# Patient Record
Sex: Female | Born: 1950
Health system: Southern US, Community
[De-identification: ages and names within clinical notes are randomized; demographics above are authoritative.]

## PROBLEM LIST (undated history)

## (undated) DIAGNOSIS — I10 Essential (primary) hypertension: Secondary | ICD-10-CM

## (undated) DIAGNOSIS — Z9049 Acquired absence of other specified parts of digestive tract: Secondary | ICD-10-CM

## (undated) DIAGNOSIS — G629 Polyneuropathy, unspecified: Secondary | ICD-10-CM

## (undated) DIAGNOSIS — M1710 Unilateral primary osteoarthritis, unspecified knee: Secondary | ICD-10-CM

## (undated) DIAGNOSIS — A419 Sepsis, unspecified organism: Secondary | ICD-10-CM

## (undated) DIAGNOSIS — G43909 Migraine, unspecified, not intractable, without status migrainosus: Secondary | ICD-10-CM

## (undated) DIAGNOSIS — IMO0002 Reserved for concepts with insufficient information to code with codable children: Secondary | ICD-10-CM

## (undated) DIAGNOSIS — M549 Dorsalgia, unspecified: Secondary | ICD-10-CM

## (undated) DIAGNOSIS — I471 Supraventricular tachycardia, unspecified: Secondary | ICD-10-CM

## (undated) DIAGNOSIS — Z981 Arthrodesis status: Secondary | ICD-10-CM

## (undated) DIAGNOSIS — E039 Hypothyroidism, unspecified: Secondary | ICD-10-CM

## (undated) DIAGNOSIS — G8929 Other chronic pain: Secondary | ICD-10-CM

## (undated) DIAGNOSIS — E7800 Pure hypercholesterolemia, unspecified: Secondary | ICD-10-CM

## (undated) DIAGNOSIS — Z0000 Encounter for general adult medical examination without abnormal findings: Secondary | ICD-10-CM

## (undated) DIAGNOSIS — M2392 Unspecified internal derangement of left knee: Secondary | ICD-10-CM

## (undated) DIAGNOSIS — I7300 Raynaud's syndrome without gangrene: Secondary | ICD-10-CM

## (undated) DIAGNOSIS — L409 Psoriasis, unspecified: Secondary | ICD-10-CM

## (undated) DIAGNOSIS — S32009K Unspecified fracture of unspecified lumbar vertebra, subsequent encounter for fracture with nonunion: Secondary | ICD-10-CM

## (undated) DIAGNOSIS — M2390 Unspecified internal derangement of unspecified knee: Secondary | ICD-10-CM

## (undated) DIAGNOSIS — M25569 Pain in unspecified knee: Secondary | ICD-10-CM

## (undated) DIAGNOSIS — T7840XA Allergy, unspecified, initial encounter: Secondary | ICD-10-CM

## (undated) DIAGNOSIS — J189 Pneumonia, unspecified organism: Secondary | ICD-10-CM

## (undated) DIAGNOSIS — K529 Noninfective gastroenteritis and colitis, unspecified: Secondary | ICD-10-CM

## (undated) DIAGNOSIS — M2240 Chondromalacia patellae, unspecified knee: Secondary | ICD-10-CM

## (undated) DIAGNOSIS — K5790 Diverticulosis of intestine, part unspecified, without perforation or abscess without bleeding: Secondary | ICD-10-CM

## (undated) DIAGNOSIS — K635 Polyp of colon: Secondary | ICD-10-CM

## (undated) DIAGNOSIS — M1990 Unspecified osteoarthritis, unspecified site: Secondary | ICD-10-CM

## (undated) DIAGNOSIS — A020 Salmonella enteritis: Secondary | ICD-10-CM

## (undated) DIAGNOSIS — M7138 Other bursal cyst, other site: Secondary | ICD-10-CM

## (undated) DIAGNOSIS — D509 Iron deficiency anemia, unspecified: Secondary | ICD-10-CM

## (undated) DIAGNOSIS — M4800 Spinal stenosis, site unspecified: Secondary | ICD-10-CM

## (undated) DIAGNOSIS — I872 Venous insufficiency (chronic) (peripheral): Secondary | ICD-10-CM

## (undated) DIAGNOSIS — C439 Malignant melanoma of skin, unspecified: Secondary | ICD-10-CM

## (undated) DIAGNOSIS — M359 Systemic involvement of connective tissue, unspecified: Secondary | ICD-10-CM

## (undated) DIAGNOSIS — M797 Fibromyalgia: Secondary | ICD-10-CM

## (undated) DIAGNOSIS — R509 Fever, unspecified: Secondary | ICD-10-CM

## (undated) DIAGNOSIS — R079 Chest pain, unspecified: Secondary | ICD-10-CM

## (undated) DIAGNOSIS — N189 Chronic kidney disease, unspecified: Secondary | ICD-10-CM

## (undated) DIAGNOSIS — F418 Other specified anxiety disorders: Secondary | ICD-10-CM

## (undated) DIAGNOSIS — R230 Cyanosis: Secondary | ICD-10-CM

## (undated) DIAGNOSIS — B86 Scabies: Secondary | ICD-10-CM

## (undated) DIAGNOSIS — S83282A Other tear of lateral meniscus, current injury, left knee, initial encounter: Secondary | ICD-10-CM

## (undated) HISTORY — PX: COLONOSCOPY: SHX174

## (undated) HISTORY — PX: PATELLAR REEFING: SHX5418

## (undated) HISTORY — DX: Fibromyalgia: M79.7

## (undated) HISTORY — DX: Other bursal cyst, other site: M71.38

## (undated) HISTORY — DX: Other specified anxiety disorders: F41.8

## (undated) HISTORY — DX: Other chronic pain: G89.29

## (undated) HISTORY — DX: Fever, unspecified: R50.9

## (undated) HISTORY — DX: Noninfective gastroenteritis and colitis, unspecified: K52.9

## (undated) HISTORY — DX: Unspecified osteoarthritis, unspecified site: M19.90

## (undated) HISTORY — DX: Scabies: B86

## (undated) HISTORY — DX: Unspecified fracture of unspecified lumbar vertebra, subsequent encounter for fracture with nonunion: S32.009K

## (undated) HISTORY — PX: SPINE SURGERY: SHX786

## (undated) HISTORY — DX: Essential (primary) hypertension: I10

## (undated) HISTORY — DX: Reserved for concepts with insufficient information to code with codable children: IMO0002

## (undated) HISTORY — DX: Chest pain, unspecified: R07.9

## (undated) HISTORY — DX: Pneumonia, unspecified organism: J18.9

## (undated) HISTORY — DX: Unspecified internal derangement of unspecified knee: M23.90

## (undated) HISTORY — DX: Chronic kidney disease, unspecified: N18.9

## (undated) HISTORY — DX: Malignant melanoma of skin, unspecified: C43.9

## (undated) HISTORY — PX: VENOUS ABLATION: SHX2656

## (undated) HISTORY — DX: Polyneuropathy, unspecified: G62.9

## (undated) HISTORY — DX: Migraine, unspecified, not intractable, without status migrainosus: G43.909

## (undated) HISTORY — DX: Allergy, unspecified, initial encounter: T78.40XA

## (undated) HISTORY — DX: Cyanosis: R23.0

## (undated) HISTORY — DX: Diverticulosis of intestine, part unspecified, without perforation or abscess without bleeding: K57.90

## (undated) HISTORY — PX: OTHER SURGICAL HISTORY: SHX169

## (undated) HISTORY — DX: Venous insufficiency (chronic) (peripheral): I87.2

## (undated) HISTORY — DX: Salmonella enteritis: A02.0

## (undated) HISTORY — PX: WISDOM TOOTH EXTRACTION: SHX21

## (undated) HISTORY — DX: Arthrodesis status: Z98.1

## (undated) HISTORY — DX: Sepsis, unspecified organism: A41.9

## (undated) HISTORY — DX: Other tear of lateral meniscus, current injury, left knee, initial encounter: S83.282A

## (undated) HISTORY — DX: Unspecified internal derangement of left knee: M23.92

## (undated) HISTORY — DX: Chondromalacia patellae, unspecified knee: M22.40

## (undated) HISTORY — DX: Polyp of colon: K63.5

## (undated) HISTORY — DX: Iron deficiency anemia, unspecified: D50.9

## (undated) HISTORY — DX: Encounter for general adult medical examination without abnormal findings: Z00.00

## (undated) HISTORY — DX: Unilateral primary osteoarthritis, unspecified knee: M17.10

## (undated) HISTORY — DX: Psoriasis, unspecified: L40.9

## (undated) HISTORY — DX: Spinal stenosis, site unspecified: M48.00

## (undated) HISTORY — DX: Hypothyroidism, unspecified: E03.9

---
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
## 1997-03-13 HISTORY — PX: CERVICAL DISCECTOMY: SHX98

## 1997-07-20 ENCOUNTER — Other Ambulatory Visit: Admission: RE | Admit: 1997-07-20 | Discharge: 1997-07-20 | Payer: Self-pay | Admitting: Obstetrics & Gynecology

## 1997-11-23 ENCOUNTER — Emergency Department (HOSPITAL_COMMUNITY): Admission: EM | Admit: 1997-11-23 | Discharge: 1997-11-24 | Payer: Self-pay | Admitting: Emergency Medicine

## 1998-06-11 ENCOUNTER — Encounter: Payer: Self-pay | Admitting: Orthopedic Surgery

## 1998-06-11 ENCOUNTER — Ambulatory Visit (HOSPITAL_COMMUNITY): Admission: RE | Admit: 1998-06-11 | Discharge: 1998-06-11 | Payer: Self-pay | Admitting: Orthopedic Surgery

## 1998-06-23 ENCOUNTER — Encounter: Payer: Self-pay | Admitting: Orthopedic Surgery

## 1998-06-23 ENCOUNTER — Ambulatory Visit (HOSPITAL_COMMUNITY): Admission: RE | Admit: 1998-06-23 | Discharge: 1998-06-23 | Payer: Self-pay | Admitting: Orthopedic Surgery

## 1998-07-05 ENCOUNTER — Encounter: Payer: Self-pay | Admitting: Orthopedic Surgery

## 1998-07-05 ENCOUNTER — Ambulatory Visit (HOSPITAL_COMMUNITY): Admission: RE | Admit: 1998-07-05 | Discharge: 1998-07-05 | Payer: Self-pay | Admitting: Orthopedic Surgery

## 1998-12-29 ENCOUNTER — Other Ambulatory Visit: Admission: RE | Admit: 1998-12-29 | Discharge: 1998-12-29 | Payer: Self-pay | Admitting: Obstetrics and Gynecology

## 1999-10-13 ENCOUNTER — Ambulatory Visit (HOSPITAL_COMMUNITY): Admission: RE | Admit: 1999-10-13 | Discharge: 1999-10-13 | Payer: Self-pay | Admitting: *Deleted

## 2000-04-08 ENCOUNTER — Emergency Department (HOSPITAL_COMMUNITY): Admission: EM | Admit: 2000-04-08 | Discharge: 2000-04-08 | Payer: Self-pay | Admitting: *Deleted

## 2000-04-08 ENCOUNTER — Encounter: Payer: Self-pay | Admitting: *Deleted

## 2001-02-06 ENCOUNTER — Emergency Department (HOSPITAL_COMMUNITY): Admission: EM | Admit: 2001-02-06 | Discharge: 2001-02-06 | Payer: Self-pay | Admitting: Emergency Medicine

## 2001-03-28 ENCOUNTER — Emergency Department (HOSPITAL_COMMUNITY): Admission: EM | Admit: 2001-03-28 | Discharge: 2001-03-28 | Payer: Self-pay | Admitting: Emergency Medicine

## 2001-04-02 ENCOUNTER — Inpatient Hospital Stay (HOSPITAL_COMMUNITY): Admission: EM | Admit: 2001-04-02 | Discharge: 2001-04-06 | Payer: Self-pay | Admitting: Emergency Medicine

## 2001-07-10 ENCOUNTER — Other Ambulatory Visit: Admission: RE | Admit: 2001-07-10 | Discharge: 2001-07-10 | Payer: Self-pay | Admitting: Obstetrics and Gynecology

## 2001-12-09 ENCOUNTER — Encounter: Payer: Self-pay | Admitting: Sports Medicine

## 2001-12-09 ENCOUNTER — Ambulatory Visit (HOSPITAL_COMMUNITY): Admission: RE | Admit: 2001-12-09 | Discharge: 2001-12-09 | Payer: Self-pay | Admitting: Sports Medicine

## 2001-12-17 ENCOUNTER — Encounter: Payer: Self-pay | Admitting: Neurosurgery

## 2001-12-18 ENCOUNTER — Encounter: Payer: Self-pay | Admitting: Neurosurgery

## 2001-12-18 ENCOUNTER — Inpatient Hospital Stay (HOSPITAL_COMMUNITY): Admission: RE | Admit: 2001-12-18 | Discharge: 2001-12-19 | Payer: Self-pay | Admitting: Neurosurgery

## 2002-07-17 ENCOUNTER — Other Ambulatory Visit: Admission: RE | Admit: 2002-07-17 | Discharge: 2002-07-17 | Payer: Self-pay | Admitting: Obstetrics and Gynecology

## 2002-10-17 ENCOUNTER — Ambulatory Visit (HOSPITAL_COMMUNITY): Admission: RE | Admit: 2002-10-17 | Discharge: 2002-10-17 | Payer: Self-pay | Admitting: Gastroenterology

## 2003-02-10 ENCOUNTER — Encounter: Admission: RE | Admit: 2003-02-10 | Discharge: 2003-02-10 | Payer: Self-pay | Admitting: Family Medicine

## 2004-03-16 ENCOUNTER — Encounter: Admission: RE | Admit: 2004-03-16 | Discharge: 2004-03-16 | Payer: Self-pay | Admitting: Neurosurgery

## 2004-04-18 ENCOUNTER — Emergency Department (HOSPITAL_COMMUNITY): Admission: EM | Admit: 2004-04-18 | Discharge: 2004-04-19 | Payer: Self-pay | Admitting: Emergency Medicine

## 2004-04-21 ENCOUNTER — Encounter: Admission: RE | Admit: 2004-04-21 | Discharge: 2004-04-21 | Payer: Self-pay | Admitting: Family Medicine

## 2004-08-17 ENCOUNTER — Other Ambulatory Visit: Admission: RE | Admit: 2004-08-17 | Discharge: 2004-08-17 | Payer: Self-pay | Admitting: Obstetrics and Gynecology

## 2004-08-17 ENCOUNTER — Ambulatory Visit (HOSPITAL_COMMUNITY): Admission: RE | Admit: 2004-08-17 | Discharge: 2004-08-17 | Payer: Self-pay | Admitting: Obstetrics and Gynecology

## 2005-03-13 HISTORY — PX: SYNOVIAL CYST EXCISION: SUR507

## 2005-05-16 ENCOUNTER — Encounter: Admission: RE | Admit: 2005-05-16 | Discharge: 2005-05-16 | Payer: Self-pay | Admitting: Family Medicine

## 2005-07-13 ENCOUNTER — Inpatient Hospital Stay (HOSPITAL_COMMUNITY): Admission: RE | Admit: 2005-07-13 | Discharge: 2005-07-16 | Payer: Self-pay | Admitting: Neurosurgery

## 2005-09-20 ENCOUNTER — Other Ambulatory Visit: Admission: RE | Admit: 2005-09-20 | Discharge: 2005-09-20 | Payer: Self-pay | Admitting: Obstetrics and Gynecology

## 2006-07-04 ENCOUNTER — Encounter: Admission: RE | Admit: 2006-07-04 | Discharge: 2006-07-04 | Payer: Self-pay | Admitting: Family Medicine

## 2007-03-14 DIAGNOSIS — M2392 Unspecified internal derangement of left knee: Secondary | ICD-10-CM

## 2007-03-14 HISTORY — DX: Unspecified internal derangement of left knee: M23.92

## 2007-03-14 HISTORY — PX: KNEE ARTHROSCOPY W/ MENISCAL REPAIR: SHX1877

## 2007-04-30 ENCOUNTER — Encounter: Admission: RE | Admit: 2007-04-30 | Discharge: 2007-04-30 | Payer: Self-pay | Admitting: Family Medicine

## 2008-04-25 ENCOUNTER — Ambulatory Visit: Payer: Self-pay | Admitting: Internal Medicine

## 2008-04-25 ENCOUNTER — Observation Stay (HOSPITAL_COMMUNITY): Admission: EM | Admit: 2008-04-25 | Discharge: 2008-04-25 | Payer: Self-pay | Admitting: Emergency Medicine

## 2008-10-16 ENCOUNTER — Ambulatory Visit: Payer: Self-pay | Admitting: Vascular Surgery

## 2008-12-08 ENCOUNTER — Observation Stay (HOSPITAL_COMMUNITY): Admission: EM | Admit: 2008-12-08 | Discharge: 2008-12-08 | Payer: Self-pay | Admitting: Emergency Medicine

## 2009-01-22 ENCOUNTER — Ambulatory Visit: Payer: Self-pay | Admitting: Vascular Surgery

## 2009-03-17 ENCOUNTER — Ambulatory Visit: Payer: Self-pay | Admitting: Vascular Surgery

## 2009-03-24 ENCOUNTER — Ambulatory Visit: Payer: Self-pay | Admitting: Vascular Surgery

## 2010-01-12 ENCOUNTER — Encounter: Admission: RE | Admit: 2010-01-12 | Discharge: 2010-01-12 | Payer: Self-pay | Admitting: Sports Medicine

## 2010-02-11 ENCOUNTER — Emergency Department (HOSPITAL_COMMUNITY)
Admission: EM | Admit: 2010-02-11 | Discharge: 2010-02-11 | Payer: Self-pay | Source: Home / Self Care | Admitting: Emergency Medicine

## 2010-04-12 ENCOUNTER — Encounter
Admission: RE | Admit: 2010-04-12 | Discharge: 2010-04-12 | Payer: Self-pay | Source: Home / Self Care | Attending: Neurosurgery | Admitting: Neurosurgery

## 2010-05-23 LAB — POCT I-STAT, CHEM 8
BUN: 23 mg/dL (ref 6–23)
Calcium, Ion: 1.18 mmol/L (ref 1.12–1.32)
Chloride: 101 mEq/L (ref 96–112)
Creatinine, Ser: 1.1 mg/dL (ref 0.4–1.2)
Glucose, Bld: 95 mg/dL (ref 70–99)
HCT: 41 % (ref 36.0–46.0)
Hemoglobin: 13.9 g/dL (ref 12.0–15.0)
Potassium: 4.1 mEq/L (ref 3.5–5.1)
Sodium: 139 mEq/L (ref 135–145)
TCO2: 31 mmol/L (ref 0–100)

## 2010-05-23 LAB — DIFFERENTIAL
Basophils Absolute: 0 10*3/uL (ref 0.0–0.1)
Basophils Relative: 1 % (ref 0–1)
Eosinophils Absolute: 0.2 10*3/uL (ref 0.0–0.7)
Eosinophils Relative: 4 % (ref 0–5)
Lymphocytes Relative: 35 % (ref 12–46)
Lymphs Abs: 2.1 10*3/uL (ref 0.7–4.0)
Monocytes Absolute: 0.4 10*3/uL (ref 0.1–1.0)
Monocytes Relative: 7 % (ref 3–12)
Neutro Abs: 3.1 10*3/uL (ref 1.7–7.7)
Neutrophils Relative %: 53 % (ref 43–77)

## 2010-05-23 LAB — POCT CARDIAC MARKERS
CKMB, poc: <1 ng/mL — ABNORMAL LOW (ref 1.0–8.0)
CKMB, poc: <1 ng/mL — ABNORMAL LOW (ref 1.0–8.0)
Myoglobin, poc: 65 ng/mL (ref 12–200)
Myoglobin, poc: 74 ng/mL (ref 12–200)
Troponin i, poc: 0.09 ng/mL (ref 0.00–0.09)
Troponin i, poc: <0.05 ng/mL (ref 0.00–0.09)

## 2010-05-23 LAB — PROTIME-INR
INR: 0.92 (ref 0.00–1.49)
Prothrombin Time: 12.6 seconds (ref 11.6–15.2)

## 2010-05-23 LAB — D-DIMER, QUANTITATIVE (NOT AT ARMC): D-Dimer, Quant: <0.22 ug/mL-FEU (ref 0.00–0.48)

## 2010-05-23 LAB — CBC
HCT: 38.8 % (ref 36.0–46.0)
Hemoglobin: 12.9 g/dL (ref 12.0–15.0)
MCH: 29.3 pg (ref 26.0–34.0)
MCHC: 33.2 g/dL (ref 30.0–36.0)
MCV: 88.2 fL (ref 78.0–100.0)
Platelets: 245 10*3/uL (ref 150–400)
RBC: 4.4 MIL/uL (ref 3.87–5.11)
RDW: 13.4 % (ref 11.5–15.5)
WBC: 5.8 10*3/uL (ref 4.0–10.5)

## 2010-06-07 ENCOUNTER — Telehealth: Payer: Self-pay | Admitting: Internal Medicine

## 2010-06-07 NOTE — Telephone Encounter (Signed)
Forwarded to Dr, Debby Bud for review.

## 2010-06-17 LAB — DIFFERENTIAL
Basophils Absolute: 0 10*3/uL (ref 0.0–0.1)
Basophils Absolute: 0.1 10*3/uL (ref 0.0–0.1)
Basophils Relative: 0 % (ref 0–1)
Basophils Relative: 1 % (ref 0–1)
Eosinophils Absolute: 0.2 10*3/uL (ref 0.0–0.7)
Eosinophils Absolute: 0.3 10*3/uL (ref 0.0–0.7)
Eosinophils Relative: 4 % (ref 0–5)
Eosinophils Relative: 5 % (ref 0–5)
Lymphocytes Relative: 28 % (ref 12–46)
Lymphocytes Relative: 31 % (ref 12–46)
Lymphs Abs: 1.4 10*3/uL (ref 0.7–4.0)
Lymphs Abs: 2.4 10*3/uL (ref 0.7–4.0)
Monocytes Absolute: 0.3 10*3/uL (ref 0.1–1.0)
Monocytes Absolute: 0.5 10*3/uL (ref 0.1–1.0)
Monocytes Relative: 6 % (ref 3–12)
Monocytes Relative: 7 % (ref 3–12)
Neutro Abs: 2.9 10*3/uL (ref 1.7–7.7)
Neutro Abs: 4.4 10*3/uL (ref 1.7–7.7)
Neutrophils Relative %: 57 % (ref 43–77)
Neutrophils Relative %: 60 % (ref 43–77)

## 2010-06-17 LAB — POCT CARDIAC MARKERS
CKMB, poc: 1.6 ng/mL (ref 1.0–8.0)
CKMB, poc: 2.4 ng/mL (ref 1.0–8.0)
Myoglobin, poc: 84.8 ng/mL (ref 12–200)
Myoglobin, poc: 96.1 ng/mL (ref 12–200)
Troponin i, poc: <0.05 ng/mL (ref 0.00–0.09)
Troponin i, poc: <0.05 ng/mL (ref 0.00–0.09)

## 2010-06-17 LAB — COMPREHENSIVE METABOLIC PANEL
ALT: 18 U/L (ref 0–35)
AST: 25 U/L (ref 0–37)
Albumin: 3.8 g/dL (ref 3.5–5.2)
Alkaline Phosphatase: 59 U/L (ref 39–117)
BUN: 9 mg/dL (ref 6–23)
CO2: 26 mEq/L (ref 19–32)
Calcium: 8.8 mg/dL (ref 8.4–10.5)
Chloride: 106 mEq/L (ref 96–112)
Creatinine, Ser: 0.9 mg/dL (ref 0.4–1.2)
GFR calc Af Amer: >60 mL/min (ref >60)
GFR calc non Af Amer: >60 mL/min (ref >60)
Glucose, Bld: 90 mg/dL (ref 70–99)
Potassium: 4.4 mEq/L (ref 3.5–5.1)
Sodium: 141 mEq/L (ref 135–145)
Total Bilirubin: 0.9 mg/dL (ref 0.3–1.2)
Total Protein: 6.9 g/dL (ref 6.0–8.3)

## 2010-06-17 LAB — BASIC METABOLIC PANEL
BUN: 9 mg/dL (ref 6–23)
CO2: 29 mEq/L (ref 19–32)
Calcium: 9.4 mg/dL (ref 8.4–10.5)
Chloride: 104 mEq/L (ref 96–112)
Creatinine, Ser: 0.88 mg/dL (ref 0.4–1.2)
GFR calc Af Amer: >60 mL/min (ref >60)
GFR calc non Af Amer: >60 mL/min (ref >60)
Glucose, Bld: 101 mg/dL — ABNORMAL HIGH (ref 70–99)
Potassium: 4.7 mEq/L (ref 3.5–5.1)
Sodium: 140 mEq/L (ref 135–145)

## 2010-06-17 LAB — CBC
HCT: 39.8 % (ref 36.0–46.0)
HCT: 39.9 % (ref 36.0–46.0)
HCT: 40.7 % (ref 36.0–46.0)
Hemoglobin: 13.5 g/dL (ref 12.0–15.0)
Hemoglobin: 13.6 g/dL (ref 12.0–15.0)
Hemoglobin: 13.6 g/dL (ref 12.0–15.0)
MCHC: 33.4 g/dL (ref 30.0–36.0)
MCHC: 33.9 g/dL (ref 30.0–36.0)
MCHC: 34.1 g/dL (ref 30.0–36.0)
MCV: 89.3 fL (ref 78.0–100.0)
MCV: 90.5 fL (ref 78.0–100.0)
MCV: 90.5 fL (ref 78.0–100.0)
Platelets: 268 10*3/uL (ref 150–400)
Platelets: 298 10*3/uL (ref 150–400)
Platelets: 315 10*3/uL (ref 150–400)
RBC: 4.4 MIL/uL (ref 3.87–5.11)
RBC: 4.46 MIL/uL (ref 3.87–5.11)
RBC: 4.5 MIL/uL (ref 3.87–5.11)
RDW: 14.2 % (ref 11.5–15.5)
RDW: 14.2 % (ref 11.5–15.5)
RDW: 14.3 % (ref 11.5–15.5)
WBC: 4.8 10*3/uL (ref 4.0–10.5)
WBC: 7.7 10*3/uL (ref 4.0–10.5)
WBC: 8.1 10*3/uL (ref 4.0–10.5)

## 2010-06-17 LAB — PROTIME-INR
INR: 1 (ref 0.00–1.49)
INR: 1 (ref 0.00–1.49)
Prothrombin Time: 13.1 seconds (ref 11.6–15.2)
Prothrombin Time: 13.5 seconds (ref 11.6–15.2)

## 2010-06-17 LAB — LIPID PANEL
Cholesterol: 144 mg/dL (ref 0–200)
HDL: 61 mg/dL (ref >39)
LDL Cholesterol: 74 mg/dL (ref 0–99)
Total CHOL/HDL Ratio: 2.4 RATIO
Triglycerides: 44 mg/dL (ref <150)
VLDL: 9 mg/dL (ref 0–40)

## 2010-06-17 LAB — CARDIAC PANEL(CRET KIN+CKTOT+MB+TROPI)
CK, MB: 1.4 ng/mL (ref 0.3–4.0)
Relative Index: 1.4 (ref 0.0–2.5)
Total CK: 103 U/L (ref 7–177)
Troponin I: 0.02 ng/mL (ref 0.00–0.06)

## 2010-06-17 LAB — CK TOTAL AND CKMB (NOT AT ARMC)
CK, MB: 1.8 ng/mL (ref 0.3–4.0)
Relative Index: 1.7 (ref 0.0–2.5)
Total CK: 109 U/L (ref 7–177)

## 2010-06-17 LAB — TROPONIN I: Troponin I: 0.01 ng/mL (ref 0.00–0.06)

## 2010-06-17 LAB — LACTIC ACID, PLASMA: Lactic Acid, Venous: 1.2 mmol/L (ref 0.5–2.2)

## 2010-06-17 LAB — APTT: aPTT: 29 seconds (ref 24–37)

## 2010-06-28 LAB — POCT CARDIAC MARKERS
CKMB, poc: 1.3 ng/mL (ref 1.0–8.0)
Myoglobin, poc: 87.8 ng/mL (ref 12–200)
Troponin i, poc: <0.05 ng/mL (ref 0.00–0.09)

## 2010-06-28 LAB — DIFFERENTIAL
Basophils Absolute: 0 10*3/uL (ref 0.0–0.1)
Basophils Relative: 1 % (ref 0–1)
Eosinophils Absolute: 0.4 10*3/uL (ref 0.0–0.7)
Eosinophils Relative: 4 % (ref 0–5)
Lymphocytes Relative: 32 % (ref 12–46)
Lymphs Abs: 2.7 10*3/uL (ref 0.7–4.0)
Monocytes Absolute: 0.6 10*3/uL (ref 0.1–1.0)
Monocytes Relative: 7 % (ref 3–12)
Neutro Abs: 4.7 10*3/uL (ref 1.7–7.7)
Neutrophils Relative %: 56 % (ref 43–77)

## 2010-06-28 LAB — CBC
HCT: 41 % (ref 36.0–46.0)
Hemoglobin: 14.2 g/dL (ref 12.0–15.0)
MCHC: 34.7 g/dL (ref 30.0–36.0)
MCV: 87.7 fL (ref 78.0–100.0)
Platelets: 271 10*3/uL (ref 150–400)
RBC: 4.68 MIL/uL (ref 3.87–5.11)
RDW: 14.3 % (ref 11.5–15.5)
WBC: 8.4 10*3/uL (ref 4.0–10.5)

## 2010-06-28 LAB — CARDIAC PANEL(CRET KIN+CKTOT+MB+TROPI)
CK, MB: 1.4 ng/mL (ref 0.3–4.0)
CK, MB: 1.8 ng/mL (ref 0.3–4.0)
Relative Index: 1.8 (ref 0.0–2.5)
Relative Index: INVALID (ref 0.0–2.5)
Total CK: 100 U/L (ref 7–177)
Total CK: 86 U/L (ref 7–177)
Troponin I: <0.01 ng/mL (ref 0.00–0.06)
Troponin I: <0.01 ng/mL (ref 0.00–0.06)

## 2010-06-28 LAB — BASIC METABOLIC PANEL
BUN: 18 mg/dL (ref 6–23)
CO2: 29 mEq/L (ref 19–32)
Calcium: 9.2 mg/dL (ref 8.4–10.5)
Chloride: 100 mEq/L (ref 96–112)
Creatinine, Ser: 0.9 mg/dL (ref 0.4–1.2)
GFR calc Af Amer: >60 mL/min (ref >60)
GFR calc non Af Amer: >60 mL/min (ref >60)
Glucose, Bld: 99 mg/dL (ref 70–99)
Potassium: 3.1 mEq/L — ABNORMAL LOW (ref 3.5–5.1)
Sodium: 138 mEq/L (ref 135–145)

## 2010-06-28 LAB — CK TOTAL AND CKMB (NOT AT ARMC)

## 2010-06-28 LAB — TROPONIN I

## 2010-07-01 ENCOUNTER — Telehealth: Payer: Self-pay | Admitting: *Deleted

## 2010-07-01 NOTE — Telephone Encounter (Signed)
Not yet an established patient. For acute pain she should return to the [place] office where she has been seen....thye have an obligation to see her until she is established. In addition, they will have her old records to assist in a disposition whether it is today or at their Saturday clinic.

## 2010-07-01 NOTE — Telephone Encounter (Signed)
PER MD - pt should call [facility] for acute eval while waiting to establish with new PCP. Fiance aware - He says he will never go back to [facility] b/c he has been very dissatisfied with their service. I suggested local UC but he does not feel this is adequate. There was new pt opening Monday at 2:30 and I scheduled her for this time.   Pt is not due for annual physical until May. I advised him that MD will most likely have patient come back for another OV for CPX when due.

## 2010-07-01 NOTE — Telephone Encounter (Signed)
Pt will be a new patient in May. She has spinal stenosis and is not on chronic pain meds per her fiance. She has had surgeries by Dr Jule Ser - they did contact his office but they could not give her an apt or further advisement. Recently pt has knee injury and surgery which has aggravated her back problems. Per fiance - pt is crying and in severe pain. They feel that ER and/or UC will only tell her to f/u with her PCP.  PCP was MD at [facility] who has retired. They are req OV today to evaluate her back pain.

## 2010-07-04 ENCOUNTER — Encounter: Payer: Self-pay | Admitting: Internal Medicine

## 2010-07-04 ENCOUNTER — Ambulatory Visit (INDEPENDENT_AMBULATORY_CARE_PROVIDER_SITE_OTHER): Payer: 59 | Admitting: Internal Medicine

## 2010-07-04 DIAGNOSIS — M2390 Unspecified internal derangement of unspecified knee: Secondary | ICD-10-CM

## 2010-07-04 DIAGNOSIS — G43909 Migraine, unspecified, not intractable, without status migrainosus: Secondary | ICD-10-CM

## 2010-07-04 DIAGNOSIS — R0789 Other chest pain: Secondary | ICD-10-CM

## 2010-07-04 DIAGNOSIS — I10 Essential (primary) hypertension: Secondary | ICD-10-CM

## 2010-07-04 NOTE — Progress Notes (Signed)
Subjective:    Patient ID: [name], female    DOB: 1951/02/23, 60 y.o.   MRN: [id number]  HPI Mrs. [name] presents to establish for on-going care. She fell Jan '09 striking left knee - has had two surgeries: open bone fragments of patella; arthroscopic surgery for torn meniscus. She completed rehad and returned to work. After the death of her husband in Aug 10, 2022 she became despondent and did not follow-through with rehab. She did reurn to Dr. Thurston Hole for management of pain: NSAIDs, voltaren cream, steroid injections. She has a progressive problem. She had recent MRI and Dr. Thurston Hole has recommended additional surgery. In the meantime she has worn a brace. She has trouble with steps, standing for long or walking for long. She has constant pain. She has seen Rinaldo Ratel for second opinion who did give hyalonuronidase injections x 3. She working through [employer].   Due to gait change she has developed back pain for which she returned to Dr. Newell Coral, who operated on her back for a synovial cyst in the past. He has diagnosed severe spinal stenosis and he has recommended back surgery. A holding strategy would be ESI, but she has had an adverse reaction in the past.   For pain she has taken naproxen. She did try celebrex but had chest pain - leading to a stress test that was normal. Now she is taking advil-up to 8 a day. Ocassionaly takes APAP.  Past Medical History  Diagnosis Date  . Hypertension   . Migraine headache     hormonal related - less frequent off hormone replacement  . Synovial cyst of lumbar facet joint     lower back - s/p surgery   . Derangement of knee, left 2009    started with fall at work. Has had repair patella and torn meniscus   Past Surgical History  Procedure Date  . Ovarian cyst surgery 1972  . Appendectomy 1971  . Cervical discectomy 1999    diskectomy with fixation:plate and screws  . Synovial cyst excision 2007    lumbar spine  . Patellar reefing '09      repair of fractured patella after fall  . Knee arthroscopy w/ meniscal repair 09    left knee  . Venous ablation     for pain in the groin - VVTS did procedure. Has some residual discomfort at the  ablation site.    Family History  Problem Relation Age of Onset  . Diabetes Mother   . COPD Mother   . Aortic aneurysm Mother   . Hypertension Mother   . Cancer Father     lung  . Hyperlipidemia Father   . Hypertension Father   . Heart disease Father   . Diabetes Father   . Diabetes Brother   . Hypothyroidism Mother    History   Social History  . Marital Status: Widowed    Spouse Name: N/A    Number of Children: N/A  . Years of Education: 14   Occupational History  . 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    Social History Main Topics  . Smoking status: Never Smoker   . Smokeless tobacco: Not on file  . Alcohol Use: No  . Drug Use: Not on file  . Sexually Active: Yes -- Female partner(s)   Other Topics Concern  . 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 1 dtr- '72; 1 son '73; 4 grandchildren, 2  step-grands. 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         Review of Systems  Constitutional: Negative for activity change, fatigue and unexpected weight change.  HENT: Negative for hearing loss, ear pain and congestion.   Eyes: Negative.   Respiratory: Negative.   Cardiovascular: Negative.   Gastrointestinal: Negative.   Genitourinary: Negative.   Musculoskeletal: Negative.        [See HPI Neurological: Negative.   Hematological: Negative.   Psychiatric/Behavioral: Negative.        Objective:   Physical Exam  Constitutional: She appears well-developed and well-nourished. No distress.  HENT:  Head: Normocephalic and atraumatic.  Right Ear: External ear normal.  Left Ear: External ear normal.  Eyes: EOM are normal. Pupils are  equal, round, and reactive to light.  Neck: Neck supple. No thyromegaly present.  Cardiovascular: Normal rate, regular rhythm and normal heart sounds.  Exam reveals no friction rub.   No murmur heard. Pulmonary/Chest: Effort normal and breath sounds normal. She has no wheezes.  Abdominal: Soft. Bowel sounds are normal.  Lymphadenopathy:    She has no cervical adenopathy.  Skin: Skin is warm and dry.  Psychiatric: She has a normal mood and affect. Her behavior is normal. Thought content normal.          Assessment & Plan:  1. Knee derangement - on-going problems with her knee resulting from trauma. She is in need of further reconstruction.  Plan - defer to orthopedics  2. Hypertension - stable on present medications.  3. Chest pain - still with occasional chest discomfort.  Plan - no further testing at this time.  In summary - a very complex patient. Today's visit was spent mostly in developing the history plus review of outside materials (sent to scan) She will return for follow-up in the near future.

## 2010-07-08 ENCOUNTER — Encounter: Payer: Self-pay | Admitting: Internal Medicine

## 2010-07-08 DIAGNOSIS — I10 Essential (primary) hypertension: Secondary | ICD-10-CM | POA: Insufficient documentation

## 2010-07-08 DIAGNOSIS — M2390 Unspecified internal derangement of unspecified knee: Secondary | ICD-10-CM

## 2010-07-08 DIAGNOSIS — I129 Hypertensive chronic kidney disease with stage 1 through stage 4 chronic kidney disease, or unspecified chronic kidney disease: Secondary | ICD-10-CM | POA: Insufficient documentation

## 2010-07-08 DIAGNOSIS — R079 Chest pain, unspecified: Secondary | ICD-10-CM

## 2010-07-08 DIAGNOSIS — G43909 Migraine, unspecified, not intractable, without status migrainosus: Secondary | ICD-10-CM | POA: Insufficient documentation

## 2010-07-08 HISTORY — DX: Essential (primary) hypertension: I10

## 2010-07-08 HISTORY — DX: Hypertensive chronic kidney disease with stage 1 through stage 4 chronic kidney disease, or unspecified chronic kidney disease: I12.9

## 2010-07-08 HISTORY — DX: Chest pain, unspecified: R07.9

## 2010-07-08 HISTORY — DX: Unspecified internal derangement of unspecified knee: M23.90

## 2010-07-18 ENCOUNTER — Other Ambulatory Visit: Payer: Self-pay | Admitting: Internal Medicine

## 2010-07-18 ENCOUNTER — Other Ambulatory Visit (INDEPENDENT_AMBULATORY_CARE_PROVIDER_SITE_OTHER): Payer: 59

## 2010-07-18 ENCOUNTER — Encounter: Payer: Self-pay | Admitting: Internal Medicine

## 2010-07-18 ENCOUNTER — Other Ambulatory Visit (INDEPENDENT_AMBULATORY_CARE_PROVIDER_SITE_OTHER): Payer: 59 | Admitting: Internal Medicine

## 2010-07-18 ENCOUNTER — Ambulatory Visit: Payer: Self-pay | Admitting: Internal Medicine

## 2010-07-18 DIAGNOSIS — Z0000 Encounter for general adult medical examination without abnormal findings: Secondary | ICD-10-CM

## 2010-07-18 LAB — URINALYSIS, ROUTINE W REFLEX MICROSCOPIC
Bilirubin Urine: NEGATIVE
Ketones, ur: NEGATIVE
Nitrite: POSITIVE
Specific Gravity, Urine: 1.01 (ref 1.000–1.030)
Total Protein, Urine: NEGATIVE
Urine Glucose: NEGATIVE
Urobilinogen, UA: 0.2 (ref 0.0–1.0)
pH: 6 (ref 5.0–8.0)

## 2010-07-18 LAB — LIPID PANEL
Cholesterol: 178 mg/dL (ref 0–200)
HDL: 57.9 mg/dL (ref >39.00)
LDL Cholesterol: 104 mg/dL — ABNORMAL HIGH (ref 0–99)
Total CHOL/HDL Ratio: 3
Triglycerides: 83 mg/dL (ref 0.0–149.0)
VLDL: 16.6 mg/dL (ref 0.0–40.0)

## 2010-07-18 LAB — BASIC METABOLIC PANEL
BUN: 18 mg/dL (ref 6–23)
CO2: 28 mEq/L (ref 19–32)
Calcium: 9.4 mg/dL (ref 8.4–10.5)
Chloride: 103 mEq/L (ref 96–112)
Creatinine, Ser: 1 mg/dL (ref 0.4–1.2)
GFR: 62.41 mL/min (ref >60.00)
Glucose, Bld: 87 mg/dL (ref 70–99)
Potassium: 4.7 mEq/L (ref 3.5–5.1)
Sodium: 139 mEq/L (ref 135–145)

## 2010-07-18 LAB — CBC WITH DIFFERENTIAL/PLATELET
Basophils Absolute: 0 10*3/uL (ref 0.0–0.1)
Basophils Relative: 0.4 % (ref 0.0–3.0)
Eosinophils Absolute: 0.3 10*3/uL (ref 0.0–0.7)
Eosinophils Relative: 4.7 % (ref 0.0–5.0)
HCT: 39.7 % (ref 36.0–46.0)
Hemoglobin: 13.8 g/dL (ref 12.0–15.0)
Lymphocytes Relative: 31.1 % (ref 12.0–46.0)
Lymphs Abs: 1.8 10*3/uL (ref 0.7–4.0)
MCHC: 34.7 g/dL (ref 30.0–36.0)
MCV: 88.4 fl (ref 78.0–100.0)
Monocytes Absolute: 0.5 10*3/uL (ref 0.1–1.0)
Monocytes Relative: 8.1 % (ref 3.0–12.0)
Neutro Abs: 3.2 10*3/uL (ref 1.4–7.7)
Neutrophils Relative %: 55.7 % (ref 43.0–77.0)
Platelets: 273 10*3/uL (ref 150.0–400.0)
RBC: 4.49 Mil/uL (ref 3.87–5.11)
RDW: 14.1 % (ref 11.5–14.6)
WBC: 5.8 10*3/uL (ref 4.5–10.5)

## 2010-07-18 LAB — HEPATIC FUNCTION PANEL
ALT: 33 U/L (ref 0–35)
AST: 29 U/L (ref 0–37)
Albumin: 4.2 g/dL (ref 3.5–5.2)
Alkaline Phosphatase: 70 U/L (ref 39–117)
Bilirubin, Direct: 0.1 mg/dL (ref 0.0–0.3)
Total Bilirubin: 0.4 mg/dL (ref 0.3–1.2)
Total Protein: 7.1 g/dL (ref 6.0–8.3)

## 2010-07-18 LAB — TSH: TSH: 3.19 u[IU]/mL (ref 0.35–5.50)

## 2010-07-19 ENCOUNTER — Telehealth: Payer: Self-pay | Admitting: Internal Medicine

## 2010-07-19 NOTE — Telephone Encounter (Signed)
U/A 5/7 is positive for many bacteria. Please call patient: if she is symptomatic call in septra DS bid x 5 days  Thanks

## 2010-07-19 NOTE — Telephone Encounter (Signed)
Pt reports no urinary symptoms. Advised her to call w/any concerns

## 2010-07-25 ENCOUNTER — Encounter: Payer: 59 | Admitting: Internal Medicine

## 2010-07-25 ENCOUNTER — Other Ambulatory Visit: Payer: Self-pay | Admitting: Internal Medicine

## 2010-07-25 ENCOUNTER — Other Ambulatory Visit (INDEPENDENT_AMBULATORY_CARE_PROVIDER_SITE_OTHER): Payer: 59

## 2010-07-25 ENCOUNTER — Ambulatory Visit: Payer: 59 | Admitting: Internal Medicine

## 2010-07-25 DIAGNOSIS — N3000 Acute cystitis without hematuria: Secondary | ICD-10-CM

## 2010-07-25 LAB — URINALYSIS, ROUTINE W REFLEX MICROSCOPIC
Bilirubin Urine: NEGATIVE
Ketones, ur: NEGATIVE
Leukocytes, UA: NEGATIVE
Nitrite: NEGATIVE
Specific Gravity, Urine: <=1.005 (ref 1.000–1.030)
Total Protein, Urine: NEGATIVE
Urine Glucose: NEGATIVE
Urobilinogen, UA: 0.2 (ref 0.0–1.0)
pH: 7 (ref 5.0–8.0)

## 2010-07-26 NOTE — Assessment & Plan Note (Signed)
OFFICE VISIT   [name], [name]  DOB:  1950-07-07                                       01/22/2009  CHART#:[id number]   The patient presents today for continued discussion regarding her left  leg venous pathology.  She was seen 3 months ago at which time she was  placed in thigh-high graduated compression stockings with her left  venous reflux.  She continues to have difficulty related to this.  She  reports she has had no improvement with the compression garments.  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  She is on her feet for prolonged periods and this makes her  job walking difficult due to leg swelling and pain over the  varicosities.  She also dances and walks for exercise and had a  decreased frequency and duration due to leg pain and swelling.  Also  states the leg pain makes her housework and yard work difficult.   PHYSICAL EXAMINATION:  Physical exam is unchanged.  She continues to be  a nonsmoker.  She has no cardiac or pulmonary dysfunction.  She reports  that the pain is achy sensation, with both aching in her calf and also  pain specifically over the varicosities themselves.   I have reviewed her duplex with her and again imaged her vein.  She does  have reflux in her great saphenous vein.  I have recommend we proceed  with laser ablation of the left great saphenous vein, stab phlebectomy  of tributary varicosities.  She wishes to proceed with this once we have  assured insurance coverage.   Larina Earthly, M.D.  Electronically Signed   TFE/MEDQ  D:  01/22/2009  T:  01/25/2009  Job:  [id number]

## 2010-07-26 NOTE — Assessment & Plan Note (Signed)
OFFICE VISIT   LETHA, MIRABAL  DOB:  [birth year]                                       03/17/2009  CHART#:[id number]   The patient presents today for treatment of her symptomatic left  saphenous vein hypertension and varicosities.  She underwent uneventful  laser ablation from the level of her knee to her groin with stab  phlebectomy of multiple tributary varicosities.  She also had treatment  of one prominent spider vein over the pre tibial area on the left.  She  reported a history of allergy to Novocaine but on further questioning  she, it sounds as if she had a reaction to epinephrine with rapid heart  rate.  She was treated successfully with the standard dose of lidocaine  but withholding the epinephrine.  There was no immediate complication  and she will be seen again in 1 week for duplex and office followup.     Larina Earthly, M.D.  Electronically Signed   TFE/MEDQ  D:  03/17/2009  T:  03/18/2009  Job:  [id number]

## 2010-07-26 NOTE — Procedures (Signed)
DUPLEX DEEP VENOUS EXAM - LOWER EXTREMITY   INDICATION:  Follow up left great saphenous vein ablation.   HISTORY:  Edema:  No.  Trauma/Surgery:  Yes.  Pain:  No.  PE:  No.  Previous DVT:  No.  Anticoagulants:  Other:   DUPLEX EXAM:                CFV   SFV   PopV  PTV    GSV                R  L  R  L  R  L  R   L  R  L  Thrombosis    o  o     o     o      o     +  Spontaneous   +  +     +     +      +     +  Phasic        +  +     +     +      +     +  Augmentation  +  +     +     +      +     +  Compressible  +  +     +     +      +     +  Competent     +  +     +     +      +     +   Legend:  + - yes  o - no  p - partial  D - decreased   IMPRESSION:  1. No evidence of deep venous thrombosis noted in the left leg.  2. The left great saphenous vein appears ablated from proximal thigh      with minimal flow at a proximal level to the knee level.    _____________________________  Larina Earthly, M.D.   MG/MEDQ  D:  03/24/2009  T:  03/24/2009  Job:  [id number]

## 2010-07-26 NOTE — Assessment & Plan Note (Signed)
OFFICE VISIT   [name], [name]  DOB:  [birth year]                                       03/24/2009  CHART#:[id number]   The patient presents today for 1 week followup of laser ablation of her  left great saphenous vein from her knee to her groin and stab  phlebectomy of multiple tributary varicosities and sclerotherapy of the  pretibial telangiectasia.  She did quite well with minimal discomfort  and returned to work on Friday 2 days following the procedure.  She has  mild bruising from the procedure and this is responding as well.  I did  unroof several areas on the telangiectasia of her pretibial area to  express some old clot for more rapid resolution of her treatment in this  area.  She is quite pleased with her initial result as am I.  We plan to  see her again in 6 weeks for final followup.     Larina Earthly, M.D.  Electronically Signed   TFE/MEDQ  D:  03/24/2009  T:  03/25/2009  Job:  [id number]   cc:   Molly Maduro A. Thurston Hole, M.D.  Chales Salmon. Abigail Miyamoto, M.D.

## 2010-07-26 NOTE — Consult Note (Signed)
NEW PATIENT CONSULTATION   [name], [name]  DOB:  March 22, 1950                                       10/16/2008  CHART#:[id number]   The patient presents today for evaluation of left leg venous pathology.  She is a very pleasant 60 year old white female with progressive pain  and discomfort of her varicose veins in her left leg.  She reports they  have been present for many years but have become increasingly painful,  most particularly in the left medial thigh and medial calf.  She did  suffer a fall at work and had a meniscal tear.  She has been treated by  Dr. Thurston Hole for this.  This pain is a separate discrete pain, but both  pains specifically over the veins themselves and also a tired achy  sensation with prolonged standing.  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   PAST HISTORY:  Otherwise, negative for DVT.  She does have a history of  hypertension.  No history of cardiac or pulmonary dysfunction.  Does  have a history of premature atherosclerotic disease in her father.   SOCIAL HISTORY:  She is widowed with 2 children.  She does not smoke or  drink alcohol.   REVIEW OF SYSTEMS:  Weight is 169 pounds.  She is 5 feet 6 inches tall.  She denies cardiac, pulmonary, GI, or GU dysfunction.   CURRENT MEDICATIONS:  Includes hydrochlorothiazide.   ALLERGIES:  Novocaine.   PHYSICAL EXAM:  Well-developed, well-nourished white female appears  stated age of 45.  She does have dorsalis pedis pulses palpable  bilaterally.  She does not have any significant pathology in her right  leg veins.  Left leg is noted with marked varicose veins in her thigh  and calves and telangiectasia over her pretibial area.   She underwent formal venous duplex in our office and this reveals gross  reflux throughout her great saphenous  veins with multiple tributary  branches.  She does not have any evidence of deep venous reflux.  I  discussed options with the patient.  We have fitted her today with thigh-  high graduated compression stockings and instructed her on their daily  use.  She also will continue elevation and Advil for discomfort.  We  will see her again in 3 months for continued followup.  I did discuss  the possible option of laser ablation and stab phlebectomy if she fails  conservative treatment.   Larina Earthly, M.D.  Electronically Signed   TFE/MEDQ  D:  10/16/2008  T:  10/19/2008  Job:  [id number]   cc:   Molly Maduro A. Thurston Hole, M.D.  Chales Salmon. Abigail Miyamoto, M.D.
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
## 2010-07-26 NOTE — Procedures (Signed)
LOWER EXTREMITY VENOUS REFLUX EXAM   INDICATION:  Left leg varicose vein.   EXAM:  Using color-flow imaging and pulse Doppler spectral analysis, the  right and left common femoral, superficial femoral, popliteal, posterior  tibial, greater and lesser saphenous veins are evaluated.  There is no  evidence suggesting deep venous insufficiency in the right or left lower  extremity.   The left saphenofemoral junction is not competent with reflux of >500  milliseconds.  The left GSV is not competent with reflux of >500  milliseconds, with the caliber as described below.   The right and left proximal short saphenous vein demonstrates  competency.   GSV Diameter (used if found to be incompetent only)                                            Right    Left  Proximal Greater Saphenous Vein           cm       0.59 cm  Proximal-to-mid-thigh                     cm       0.46 cm  Mid thigh                                 cm       0.63 cm  Mid-distal thigh                          cm       0.73 cm  Distal thigh                              cm       0.59 cm  Knee                                      cm       0.51 cm    IMPRESSION:  1. Left greater saphenous vein reflux with >500 milliseconds is      identified with the caliber ranging from 0.46 cm to 0.73 cm knee to      groin.  2. The right left greater saphenous veins are not aneurysmal.  3. The right and left greater saphenous veins are not tortuous.  4. The deep venous system is competent.  5. The right and left lesser saphenous vein is competent.         ___________________________________________  Larina Earthly, M.D.   MC/MEDQ  D:  10/16/2008  T:  10/16/2008  Job:  [id number]

## 2010-07-28 ENCOUNTER — Encounter: Payer: Self-pay | Admitting: Internal Medicine

## 2010-07-29 NOTE — Discharge Summary (Signed)
[facility]  Patient:    [name], [name] Visit Number: [id number] MRN: [id number]          Service Type: MED Location: 3W [id number] Attending Physician:  Katy Apo. Dictated by:   Renford Dills, M.D. Admit Date:  04/01/2001 Discharge Date: 04/06/2001                             Discharge Summary  DISCHARGE DIAGNOSES: 1. Intractable diarrhea secondary to Clostridium difficile colitis. 2. Dehydration. 3. Hypokalemia, resolved. 4. Recent therapy -- cefuroxime, seven days, for sinusitis. 5. Hypertension. 6. Depression.  DISCHARGE MEDICATIONS: 1. Flagyl on a tapering dosage over one month as outlined by    Dr. Florencia Reasons, gastroenterology. 2. Zoloft 50 mg one daily. 3. Levsin 0.125 mg one every eight hours.  LABORATORY AND ACCESSORY DATA:  Stool culture positive for C. difficile on admission.  Elevated white blood cell count of 14 with 87 neutrophils.  At discharge, white blood cell of 11, hemoglobin 11.8.  Patient had a urine culture which was negative.  CONSULTANTS:  Dr. Matthias Hughs of gastroenterology.  HISTORY OF PRESENT ILLNESS:  [age]-year-old female admitted for intractable diarrhea of being diagnosed with C. difficile colitis after failing outpatient treatment.  Presumably, patients colitis is a result of oral antibiotic, cefuroxime, treated for sinusitis.  Because of the patients protracted diarrhea and dehydration, admission was deemed necessary.  Please see initial history and physical for further details.  HOSPITAL COURSE: #1 - C. DIFFICILE COLITIS ASSOCIATED WITH INTRACTABLE DIARRHEA, ABDOMINAL PAIN AND DEHYDRATION:  Ms. [name] was admitted to medicine floor and had treatment instituted for above medical problems in the form of IV antibiotics and Flagyl initially p.o., then coverage IV for a few days.  During the patients hospitalization, she had persistent diarrhea of greater than 10 to 12 stools a day.  Her course did  finally begin to slow down with the above treatments.  During this hospitalization, patient requested to see a gastroenterologist, therefore, she had Dr. Matthias Hughs see her.  Patient was continued on current treatment as outlined.  Also during this hospitalization, patient had requested to have another physician see her.  For two to three days, she was seen by Dr. Greggory Stallion Osei-Bonsu.  As of January 25th, patient is deemed stable for discharge and patient is being discharged to home in stable condition.  She will continue Flagyl as outlined by Dr. Matthias Hughs, 250 mg one every six hours x7 days, then one every eight hours x7 days, then one every 12 hours x7 days, then one daily x7 days, then one every other day x14 days. During this time, patient is asked to refrain from any alcohol use and to finish all antibiotics.  At the time of discharge, patient is well-hydrated, ambulating without difficulty and cleared by medicine and gastroenterology service for discharge.  #2 - DEHYDRATION:  Patient is dehydrated as a result of her protracted diarrhea and is symptomatic with complaints of light-headedness and dizzy with ambulation.  At the time of discharge, patient has negative orthostatic changes and is cleared for discharge.  #3 - HYPOKALEMIA:  This is secondary to protracted diarrhea and concomitant hydrochlorothiazide use prior to admission.  Patient will be asked to withhold her hydrochlorothiazide and not to restart this medicine until seen by her primary care doctor.  #4 - DEPRESSION:  Patient will be asked to follow up with her current medications for depression.  #5 - HYPERTENSION:  During this hospitalization, there were no problems with hypertension and when the patient follows up with her primary care physician, he will instruct her on her hypertensive medicine, rather than continue hydrochlorothiazide, to switch to another medicine.  At this time, a diuretic is not felt to be the best  treatment for this patient as she is recovering from a significant amount of diarrhea.Dictated by:   Renford Dills, M.D.  Attending Physician:  Renford Dills D. DD:  04/06/01 TD:  04/08/01 Job: [id number]

## 2010-07-29 NOTE — H&P (Signed)
NAMELAUREE, [name]              ACCOUNT NO.:  [id number]   MEDICAL RECORD NO.:  [id number]          PATIENT TYPE:  INP   LOCATION:  NA                           FACILITY:  [facility]   PHYSICIAN:  [name], M.D.DATE OF BIRTH:  [birth year]   DATE OF ADMISSION:  07/13/2005  DATE OF DISCHARGE:                                HISTORY & PHYSICAL   HISTORY OF PRESENT ILLNESS:  The patient is 59 year old, right-handed, white  female who has been a patient of mine for several years.  She is status post  a C6-C7 anterior cervical diskectomy with arthrodesis in October 2003.  She  has had difficulties with her low back for several years.  Current  difficulties began over the past year with pain in the right side of her low  back radiating down to the right buttock and posterior thigh with numbness  and tingling in the right foot that is often aggravated by excessive  activity with some weakness.  She treated this with Aleve without relief and  the patient was evaluated with x-rays and MRI scan.   Those studies show multilevel disc disease with spondylosis.  There is  evidence of facet arthropathy particularly on the right at L5-S1.  MRI scan  reveals right L5-S1 facet arthropathy with a large, right L5-S1 synovial  cyst extending into the neural foramen and lateral recess causing  significant nerve root and thecal sac impression.  The patient is being  evaluated for decompression and arthrodesis.   PAST MEDICAL HISTORY:  1.  Hypertension.  2.  Fibromyalgia.  3.  Large cyst on her ovary.  4.  No history of myocardial infarction, cancer, peptic ulcer disease or      lung disease.   PAST SURGICAL HISTORY:  C6-C7 ACD in October 2003.   ALLERGIES:  No known drug allergies, but explains that NOVOCAIN can make her  tachycardic and STEROID INJECTIONS can cause swelling.   CURRENT MEDICATIONS:  Clonazepam, amitriptyline and Naproxen.   FAMILY HISTORY:  Her father has passed on due to  cancer.  Mother is in poor  health at age 57 with COPD with family history as well with diabetes and  hypertension.   SOCIAL HISTORY:  The patient is married.  She works several jobs as an  Youth worker.  She does not smoke.   REVIEW OF SYSTEMS:  Notable for what is described in history of present  illness and past medical history.  The 14-point review of systems is  otherwise unremarkable.   PHYSICAL EXAMINATION:  GENERAL:  The patient is a well-developed, well-  nourished, white female in no acute distress.  VITAL SIGNS:  Temperature 97.3, pulse 93, blood pressure 109/75,  respirations 20.  Height 5 feet 6 inches, weight 200 pounds.  LUNGS:  Clear to auscultation.  She has symmetrical excursions.  HEART:  Regular rate and rhythm, S1, S2, no murmur.  ABDOMEN:  Soft, nontender, bowel sounds present.  EXTREMITIES:  No clubbing, cyanosis or edema.  MUSCULOSKELETAL:  Tenderness over the right paralumbar region, none over the  left paralumbar region nor  over the lumbar spinous process.  Discomfort with  pain radiating down to the right buttock and posterior thigh with flexion,  but she is able to flex 90 degrees and any amount of extension aggravates  the pain.  She has negative straight leg raising on the left, but positive  straight leg raising on the right.  NEUROLOGIC:  5/5 strength in the distal lower extremities in the  dorsiflexors, longus and plantar flexor.  Bilateral sensation intact to  pinprick.  Acute bilateral reflexes.  Quadriceps are 1, gastrocnemius is 1,  toes are downgoing.   IMPRESSION:  Right L5-S1 synovial cyst secondary to facet arthropathy with  significant thecal sac and nerve root compression in a patient with  multilevel degenerative disc disease and spondylosis.   PLAN:  The patient will be admitted for a right L5-S1 lumbar laminotomy and  facetectomy with resection of synovial cyst and L5-S1 posterolateral  arthrodesis with  right-sided pedicle screw fixation and bone graft.  We  discussed the nature of condition, the nature of the surgical procedure,  alternatives to surgical procedures and possible immobilization with lumbar  corset and risks to her surgery including risks of infection, bleeding,  possible transfusion, pain, weakness, risk of failure of arthrodesis and  need for possible further surgery, myocardial infarction, stroke or death.  She also has degenerative changes at the L1-L2 and L4-L5 levels that may  eventually further degenerate and require surgical intervention.  Understanding all this, she wishes to proceed with surgery.      [name], M.D.  Electronically Signed     RWN/MEDQ  D:  07/13/2005  T:  07/13/2005  Job:  [id number]

## 2010-07-29 NOTE — H&P (Signed)
[facility]  Patient:    [name], [name] Visit Number: [id number] MRN: [id number]          Service Type: MED Location: 3W [id number] Attending Physician:  Katy Apo. Dictated by:   Rosanne Sack, M.D. Admit Date:  04/01/2001   CC:         Chales Salmon. Abigail Miyamoto, M.D., [facility]   History and Physical  DATE OF BIRTH:  [birth year].  PROBLEM LIST: 1. Intractable diarrhea and mild to moderate abdominal pain secondary to C.    difficile colitis.    a. C. difficile diagnosed in March 28, 2001. 2. Dehydration. 3. Hypokalemia. 4. Recent therapy with cefuroxime for 7 days for sinusitis. 5. Hypertension. 6. History of UTI in December 2002. 7. ? history of a calculus cholecystitis x 2, last November 2002.    a. Cholecystectomy performed, treated empirically with antibiotics (report       given by the patient, no objective data available). 8. Depression. 9. Intolerance to novocaine, tachycardia.  CHIEF COMPLAINT:  Intractable diarrhea.  HISTORY OF PRESENT ILLNESS:  The patient is a very pleasant 60 year old female who presents with intractable diarrhea, mild to moderate diffuse abdominal pain for about 6 days.  Around March 21, 2001, the patient was treated with at least 7 days of cefuroxime for sinusitis.  The patient started having diarrhea around March 26, 2001.  She was seen in the emergency department at [facility] on March 28, 2001.  Stool studies were positive for C. difficile.  She was placed on Flagyl.  The following day the patient went to the walk in clinic because she did not feel any better.  She was given Lomotil and she was told to continue Flagyl. On March 30, 2001, diarrhea got better.  The Flagyl was stopped by the patient.  On March 31, 2001, the patient ran out of Lomotil and the diarrhea got worse.  The patient reports about 12 watery bowel movements per day.  She  describes cramping, abdominal symptoms with the diarrhea.  The abdominal pain is diffuse and mild to moderate in intensity.  No fever, chills, nausea, or vomiting.  No skin rash.  The patient described decreased appetite, presyncopal symptoms, and symptoms consistent with lightheadedness standing up.  No focal weakness, no syncope, no chest pain, no shortness of breath, no orthopnea, no cough, no postnasal drip.  No urinary symptoms.  No melena, tarry stools, or bright red blood per rectum.  PAST MEDICAL HISTORY:  See problem list.  ALLERGIES:  See problem list.  MEDICATIONS: 1. Hydrochlorothiazide 25 mg p.o. q.d. 2. Zoloft 50 mg p.o. q.d. 3. Status post Flagyl 100 mg p.o. t.i.d. x 2 days, last dose on March 30, 2001. 4. Lomotil. The patient ran out on March 31, 2001.  SOCIAL HISTORY:  The patient is married.  She has 2 children, ages 72, and 38. She does not smoke.  She does not drink.  No illegal drug use.  The patient works as a Teacher, early years/pre.  FAMILY HISTORY:  The patients father had diabetes, hypertension and had an early heart attack in his mid 43s.  Her father ended up dying recently due to metastatic lung cancer. The patients grandmother had diabetes.  No strokes in the family.  REVIEW OF SYSTEMS:  As in history of present illness.  PHYSICAL EXAMINATION:  VITAL SIGNS:  Afebrile.  Temperature 98.4, blood pressure 121/56, heart rate 79, respiration rate 20.  HEENT:  Normocephalic and atraumatic.  Nonicteric sclerae.  Conjunctivae within normal limits.  PERRLA, EOMI.  Funduscopic negative for papilledema, or hemorrhages.  Dry mucous membranes.  TMs within normal limits.  Oropharynx clear.  NECK:  Supple.  No JVD, no bruits, no adenopathy, no thyromegaly.  LUNGS:  Clear to auscultation bilaterally without crackles or wheezes.  Fair air movement bilaterally.  CARDIAC:  Regular rate and rhythm with a 1/6 systolic murmur at the base.  No S3, no S4.  No  rubs.  ABDOMEN:  Flat, mild tenderness, diffusely.  Bowel sounds were present.  Not distended.  No rebound.  No guarding.  No hepatosplenomegaly.  No masses, no bruits.  GENITOURINARY:  Exam within normal limits.  BREASTS:  Within normal limits.  RECTAL:  Empty vault, normal sphincter tone.  EXTREMITIES:  No edema, clubbing or cyanosis.  Pulses 2+ bilaterally.  NEUROLOGIC:  Alert and awake x 3.  Strength is 5/5 in all extremities.  DTRs 3/5 in lower extremities.  Cranial nerves II-XII intact.  Sensory intact. Plantar reflexes downgoing bilaterally.  LABORATORY DATA:  Sodium 135, potassium 3.4, chloride 100, CO2 27, BUN 10, creatinine 1.0, glucose 109.  Hemoglobin 13.1, MCV 87, WBCs 14.9, absolute neutrophil count 14.0, platelets 408.  ASSESSMENT AND PLAN: 1. C. difficile colitis associated with intractable diarrhea - as described in    the HPI section, the patient ended taking just 2 days of Flagyl.  The last    dose was around March 30, 2001.  I believe that this is not related to    Flagyl therapy.  There is no evidence of acute abdomen nor sepsis.  Will    have to admit the patient to the hospital due to the profound hydration.    For now will continue the Flagyl and the Lomotil.  It will help with pain    control.  The patient requested intravenous pain medication.  At this point    there is no need for a CAT scan.  Initially, the patient requested this    imaging study along with her husband.  The patient ended up drinking about    50% of the contrasT bottle.  I explained to her that this contrast bottle    may induce diarrhea.  Will repeat another white cell count in the morning.    If the patients abdominal examination were to change, or if the symptoms    were not improved, a CAT scan of the abdomen then would be considered. 2. Dehydration - the physical exam is consistent with moderate to severe    degree of dehydration.  The patient describes lightheadedness standing  up.     There is no nausea or vomiting.  For now will hold on the hydrochlorothia-    zide. Will start IV fluids with an aggressive rate.  No previous history of    cardiac problems.  The blood pressure is stable.  Will follow the fluid balance now as well as the orthostatics every morning. 3. Hypokalemia - the serum potassium level is low due to dehydration,    diarrhea, and hydrochlorothiazide.  I will start intravenous and p.o.    potassium supplementation.  A new potassium level will be obtained in the    morning. 4. Hypertension - currently the blood pressure is stable.  We will hold    hydrochlorothiazide for now. Dictated by:   Rosanne Sack, M.D. Attending Physician:  Renford Dills D. DD:  04/02/01 TD:  04/02/01 Job: [id number]

## 2010-07-29 NOTE — Op Note (Signed)
NAMEJERMAINE, THOLL NO.:  [id number]   MEDICAL RECORD NO.:  [id number]          PATIENT TYPE:  INP   LOCATION:  3172                         FACILITY:  [facility]   PHYSICIAN:  Hewitt Shorts, M.D.DATE OF BIRTH:  09/03/1950   DATE OF PROCEDURE:  07/13/2005  DATE OF DISCHARGE:                                 OPERATIVE REPORT   PREOPERATIVE DIAGNOSES:  1.  Right L5-S1 lumbar synovial cyst.  2.  Lumbar stenosis, including right L5-S1 facet arthropathy and lumbar      radiculopathy.   POSTOPERATIVE DIAGNOSES:  1.  Right L5-S1 lumbar synovial cyst.  2.  Lumbar stenosis, including right L5-S1 facet arthropathy and lumbar      radiculopathy.   PROCEDURE:  Right L5-S1 lumbar laminotomy and facetectomy with resection of  right L5-S1 synovial cyst with microdissection and an L5-S1 posterolateral  arthrodesis with right L5-S1 Tectonics posterior instrumentation and Spire  plating and Infuse with morcellized allograft.   SURGEON:  Hewitt Shorts, M.D.   ANESTHESIA:  General endotracheal.   INDICATIONS:  The patient is a 60 year old woman who presented with a right  lumbar radiculopathy, who was found to have a large right L5-S1 synovial  cyst.  A decision was made to proceed with dissection of the cyst and  stabilization.   PROCEDURE:  The patient was brought to the operating room and placed under  general endotracheal anesthesia.  The patient turned to a prone position.  The lumbar region was prepped with Betadine soap and solution and draped in  a sterile fashion.  The midline was infiltrated with local anesthetic with  an epinephrine and an x-ray was taken and the L5-S1 level identified and a  midline incision made over the L5-S1 level and carried down through the  subcutaneous tissue with bipolar cautery and electrocautery used to maintain  hemostasis.  Dissection was carried down to the midline and then the right  lumbar fascia was incised and the right  paraspinal musculature was dissected  from the spinous processes and laminae in subperiosteal fashion.  Another x-  ray was taken and the L5-S1 interlaminar space was identified and confirmed,  and then dissection was carried out laterally exposing the ala of S1 and the  transverse process of L5.  The facet was markedly degenerated, and it was  widened.  We draped the microscope, and it was brought into the field to  provide additional magnification, illumination and visualization, and the  decompression was performed using microdissection and microsurgical  technique.  A laminotomy and facetectomy was performed using the X-Max drill  along with Kerrison punches.  There was moderate epidural scarring.  We were  able to identify the thecal sac and L5 nerve root and then medial and  inferior to that, we found the synovial cyst.  It was directly medial to the  S1 pedicle.  We dissected the epidural tissues to separate the cyst from the  thecal sac, and we were eventually able to identify the right S1 nerve root.  We were able to gradually mobilize the synovial cyst, carefully dissecting  with microsurgical  technique.  The cyst was mobilized and removed and the  thecal sac and nerve root decompressed.  We then examined the epidural space  to ensure that no other aspects of the right-sided thecal sac nor the L5 or  S1 nerve roots were compressed.  This showed that good decompression had  been achieved, and then we proceeded with the arthrodesis.  We identified  the pedicle of both L5 and S1 and we selected the smallest Tectonics plate.  We identified pedicle entry sites for each of the pedicles, and the C-arm  fluoroscope was draped and brought into the field to provide guidance to as  we placed the pedicle screws at L5 and S1.  Each of the pedicles was probed  using C-arm fluoroscopic guidance.  We examined each of the pedicles with a  ball probe.  No cut-outs were found.  Each pedicle was  tapped with a tap for  the 6.0 mm screws and then we placed a 35 mm screw at L5 and then 30 mm  screw at S1.  Each of the screws was placed through the plate, and each of  the screws was fully tightened.  When we first attempted to place a locking  cap, we stripped the threads on both the locking cap and the plate.  We then  had to remove the screws and plate and obtain a new plate.  We placed the  same screws, that is, 6.0 mm screws, 35 mm in length, at L5, and 30 mm in  length at S1, through the new plate.  The plate was fully tightened down  with both screws being fully tightened and then clocking caps were placed  over both screws and tightened against a counter torque with good  tightening.  Prior to placing the plate and screws, we had decorticated the  ala of S1 as well as the transverse process of L5.  We then went ahead and  opened the fascia on the left side.  We identified the spinous processes and  laminae of L5 and S1 on the left side and we selected a 45 mm Spire plate.  It was positioned over the spinous processes of L5 and S1 and tightened in a  symmetrical fashion at the rostral and caudal ends, and then the locking  screw was placed, tightened, and the break-away was removed and the  compression devices were removed.  We decorticated the laminae of L5 and S1  on the left side.  We took small Infuse, and half of the Infuse was placed  on the left side over the L5 and S1 laminae, the other half was placed over  the right L5 transverse process and S1 ala, and then we packed the  morcellized cancellous allograft over the Infuse.  We examined the area of  the decompression.  None of the bone graft had fallen into the the  decompression, the thecal sac and nerve roots were well-decompressed, and  then we proceeded with closure.  The deep fascia was closed with  interrupted, undyed #1 Vicryl suture, Scarpa's fascia was closed with interrupted, undyed #1 Vicryl sutures, the  subcutaneous and subcuticular  layer were closed with interrupted, inverted 2-0 Vicryl sutures, and the  skin edges were closed with Dermabond.  The wound was dressed with Adaptic  and sterile gauze, and the patient was subsequently turned back to a supine  position to be reversed from the anesthetic, extubated, and transferred to  the recovery room for further care,  where she was noted to be moving all  four extremities to command.  The estimated blood loss was 150 mL.  We did  use a Cell Saver during the procedure, but there was insufficient blood loss  to give her back any of the Cell Saver blood.  The sponge and needle count  were correct.      Hewitt Shorts, M.D.  Electronically Signed     RWN/MEDQ  D:  07/13/2005  T:  07/14/2005  Job:  [id number]

## 2010-07-29 NOTE — Op Note (Signed)
   NAME:  [name], [name]                        ACCOUNT NO.:  [id number]   MEDICAL RECORD NO.:  [id number]                   PATIENT TYPE:  AMB   LOCATION:  ENDO                                 FACILITY:  [facility]   PHYSICIAN:  Bernette Redbird, M.D.                DATE OF BIRTH:  1950/09/30   DATE OF PROCEDURE:  10/17/2002  DATE OF DISCHARGE:                                 OPERATIVE REPORT   PROCEDURE:  Colonoscopy.   INDICATIONS FOR PROCEDURE:  Screening for colon cancer in a 60 year old  female who, a couple of years ago, had severe  Clostridium difficile  colitis.   FINDINGS:  Normal examination  to the terminal ileum.   DESCRIPTION OF PROCEDURE:  The nature, purpose and risks of the procedure  have been reviewed with the patient and she provided written consent.  Sedation was Fentanyl 120 micrograms and Versed 12 mg IV without arrhythmias  or desaturation.   The Olympus adjustable tension pediatric video colonoscope was advanced to  the terminal ileum with mild difficulty, characterized by looping which was  overcome by external abdominal compression and turning the patient into the  supine position  to help get around some turns. The terminal ileum had a  normal appearance and pullback was then performed.   The quality of the prep was excellent and it was felt that all areas were  well seen. This was a normal examination. No polyps, cancer, vascular  malformations of diverticulosis was noted and retroflexion in the rectum was  unremarkable. The patient did have some perianal skin  tags.   The patient tolerated the procedure well. There were no apparent  complications. No biopsies were obtained.   IMPRESSION:  Normal screening colonoscopy.   PLAN:  Consider a screening flexible sigmoidoscopy in 5 years.                                               Bernette Redbird, M.D.    RB/MEDQ  D:  10/17/2002  T:  10/18/2002  Job:  [id number]   cc:   Chales Salmon. Abigail Miyamoto, M.D.  [address]  [place]  [place] [address]  Fax: [phone]

## 2010-07-29 NOTE — Consult Note (Signed)
[facility]  Patient:    [name], [name] Visit Number: [id number] MRN: [id number]          Service Type: MED Location: 3W [id number] Attending Physician:  Katy Apo. Dictated by:   Florencia Reasons, M.D. Proc. Date: 04/03/01 Admit Date:  04/01/2001   CC:         [name], M.D.   Consultation Report  REASON FOR CONSULTATION:  Dr. Trula Slade of the [facility] asked me to see this 60 year old female because of C. diff colitis.  HISTORY OF PRESENT ILLNESS:  Ms. [name] was admitted to the hospital two days ago following a roughly one week prodrome of watery diarrhea, non-bloody, without vomiting or significant nausea, which began toward the end of a seven day course of an antibiotic for sinusitis (the patient does not recall the exact name of the antibiotic).  Of note, the patient apparently had an urinary tract infection back in December, but she does not recall getting antibiotics around that time.  In any event, the patient was seen in the emergency room several days prior to admission, and found to be C. diff toxin positive on a stool specimen, and she was started on Flagyl, but because of continued diarrhea, she stopped it after just a couple of doses.  She was also being treated with Lomotil.  She was admitted because of basically progressive symptomatology.  Her main other symptom has been significant cramps.  There have been no fevers or chills.  There has been no vomiting.  PAST MEDICAL HISTORY:  Possible allergy to novocaine.  CURRENT MEDICATIONS IN THE HOSPITAL: 1. Potassium. 2. Zoloft. 3. Metronidazole 500 mg IV t.i.d. 4. Lomotil p.r.n. 5. Phenergan p.r.n.  OUTPATIENT MEDICATIONS: 1. Dicyclomine. 2. Birth control pills. 3. Hydrochlorothiazide. 4. Sleeping pill.  PAST SURGICAL HISTORY:  Appendectomy.  CHRONIC MEDICAL ILLNESSES:  Hypertension.  No coronary disease, chronic obstructive pulmonary  disease, or diabetes.  HABITS:  Non-smoker, non-drinker.  FAMILY HISTORY:  Negative for malignancy per chart.  I do not believe there is any family history of irritable bowel disease.  SOCIAL HISTORY:  The patient is married and lives with her husband.  She has two grown children.  She does legal work.  REVIEW OF SYSTEMS:  Please see HPI.  PHYSICAL EXAMINATION:  GENERAL:  This is a remarkably young-appearing 60 year old Caucasian female who is not in any distress at the time of my evaluation, and has numerous questions, but does not really come across as being overtly anxious or depressed.  HEENT:  She is anicteric.  There is no scleral icterus.  CHEST:  Clear.  HEART:  Normal at this time, and although she has a reported history of heart murmur by her report, I do not hear one at this time.  No gallops or rubs or arrhythmias are appreciated.  ABDOMEN:  Has fairly quiet bowel sounds and no organomegaly, guarding, mass, or significant tenderness.  LABORATORY DATA:  White blood cell count 14,900 on admission, and is 12,200 today.  C. diff toxin was positive on March 28, 2001.  Renal function normal.  IMPRESSION:  Clostridium difficile colitis, based on antecedent antibiotic exposure, compatible symptomatology of watery diarrhea, and a positive toxin ASSAY.  The severity of this colitis seems to be mild to moderate.  DISCUSSION:  It is too early to tell whether or not the patient has responded to the Flagyl, but she seems to be holding her own, and not severely compromised by the  infection at the present time.  RECOMMENDATIONS: 1. Since the patient is tolerating liquids okay, I would favor switching to    oral metronidazole tomorrow morning. 2. I favor a schedule of metronidazole that would include frequent doses of    fairly small amounts, such as 250 mg q.i.d. initially.  I then favor a    gradual taper consisting of q.i.d. dosing x7 days, followed by t.i.d.    dosing  x7 days, b.i.d. dosing x7 days, once daily dosing x7 week, and then    q.o.d. x2 weeks, after which she can go off it.  In my experience, this    prolonged course of therapy tends to be associated with a lesser likelihood    of relapse, which could otherwise be as high as a 33% risk in this setting. 3. If the patient does not show significant clinical improvement in the next    72 hours, I would consider switching to oral vancomycin, but that    medication is much more expensive. 4. The patient states that it is difficult for her to urinate.  I have ordered    an urinalysis to help exclude a recurrent urinary tract infection. 5. It is best if the patient can get by without any anti-motility medications,    so I have stopped her Lomotil and Bentyl.  She is complaining of cramps, so    I have ordered a K-Pad to the abdomen which may help, and, if necessary,    she can use short-acting sublingual hyoscyamine which would probably have    less prolonged anti-motility effects. 6. The patient is at an appropriate age for colon cancer screening, and she    will probably have either colonoscopy, or at least a sigmoidoscopy    electively as an outpatient down the road when she is better from the    current problem.  I have advised her to contact my office if we do not    contact her ourselves concerning this issue. 7. It is possible the patient will feel well enough tomorrow to go home, in    which case I would have no objection to her being discharged.  She should    probably follow up either with her primary care physician or myself    approximately 5 to 10 days post discharge.  I appreciate the opportunity to have seen this patient in consultation with you. Dictated by:   Florencia Reasons, M.D. Attending Physician:  Renford Dills D. DD:  04/03/01 TD:  04/04/01 Job: [id number]

## 2010-07-29 NOTE — Discharge Summary (Signed)
[name], [name]              ACCOUNT NO.:  [id number]   MEDICAL RECORD NO.:  [id number]          PATIENT TYPE:  INP   LOCATION:  3038                         FACILITY:  [facility]   PHYSICIAN:  Coletta Memos, M.D.     DATE OF BIRTH:  Jun 26, 1950   DATE OF ADMISSION:  07/13/2005  DATE OF DISCHARGE:  07/16/2005                                 DISCHARGE SUMMARY   ADMITTING DIAGNOSIS:  1.  Multilevel lumbar spondylosis.  2.  Facet arthropathy.  3.  L5-S1 synovial cyst right side.  4.  Right S1 radiculopathy.   POSTOPERATIVE DIAGNOSES:  1.  Multilevel lumbar spondylosis.  2.  Facet arthropathy.  3.  L5-S1 synovial cyst right side.  4.  Right S1 radiculopathy.   PROCEDURE:  Right L5-S1 lumbar laminotomy, facetectomy, resection of  synovial cyst, microdissection, L5-S1 posterolateral arthrodesis, right L5-  S1 __________ posterior instrumentation, spiral plating, infused and  morselized Allograft.   COMPLICATIONS:  None.   DISCHARGE STATUS:  Alive and well.   DESTINATION:  Home.   INDICATIONS:  [name] is a patient of Dr. Earl Gala who presented with  a large synovial cyst on the right side at L5-S1 causing a lumbar  radiculopathy.  She also had significant facet arthropathy at that level.  She was admitted and taken to the operating room where she had an  uncomplicated fusion and resection of the cyst at L5-S1.  Postoperatively  she has done well.  Her wound was clean, dry.  There were no signs of  infection.  She had some chest pain immediate postoperative day but that  resolved very shortly thereafter.  A 12-lead EKG was fine.  At discharge the  wound was clean, dry.  There were no signs of infection.  She has a normal  neurologic examination.           ______________________________  Coletta Memos, M.D.     KC/MEDQ  D:  08/14/2005  T:  08/14/2005  Job:  [id number]

## 2010-07-29 NOTE — Op Note (Signed)
[name], [name]                        ACCOUNT NO.:  [id number]   MEDICAL RECORD NO.:  [id number]                   PATIENT TYPE:  INP   LOCATION:  3016                                 FACILITY:  [facility]   PHYSICIAN:  Hewitt Shorts, M.D.            DATE OF BIRTH:  1950-06-27   DATE OF PROCEDURE:  12/18/2001  DATE OF DISCHARGE:                                 OPERATIVE REPORT   PREOPERATIVE DIAGNOSES:  C6-7 cervical disk herniation, spondylosis,  degenerative disk disease and radiculopathy.   POSTOPERATIVE DIAGNOSES:  C6-7 cervical disk herniation, spondylosis,  degenerative disk disease and radiculopathy.   OPERATION PERFORMED:  C6-7 anterior cervical diskectomy and arthrodesis with  iliac crest allograft and Trinica cervical plating.   SURGEON:  Hewitt Shorts, M.D.   ANESTHESIA:  General endotracheal.   INDICATIONS FOR PROCEDURE:  The patient is a 60 year old woman who appeared  with an acute right cervical radiculopathy with weakness of the biceps and  triceps.  MRI revealed a broad-based C6-7 cervical disk herniation with a  focal component within the right C6-7 foramen.  A decision was made to  proceed with elective anterior cervical diskectomy and arthrodesis.   DESCRIPTION OF PROCEDURE:  The patient was brought to the operating room and  placed under general endotracheal anesthesia.  The patient was placed in 10  pounds of halter traction.  The neck was prepped with Betadine soap and  solution and draped in sterile fashion.  The left side of the neck was  infiltrated with local anesthetic with epinephrine along the line of the  incision and then the incision itself was made with a Shaw scalpel at a  temperature of 120.  Dissection was carried down through the subcutaneous  tissues and platysma and then dissection was carried out to an avascular  plane leaving the sternocleidomastoid muscle, carotid artery and jugular  vein laterally and the trachea and  esophagus medially.  The ventral aspect  of the vertebral column was identified and localizing x-rays taken and the  C6-7 intervertebral disk space identified.  Diskectomy was begun with  incision of the annulus and continued with microcurets and pituitary  rongeurs.  The cartilaginous end plates of the corresponding vertebra were  removed using microcurets as well as the Micromax drill.  The operating  microscope was draped and brought into the field to provide additional  magnification, illumination and visualization and the remainder of the  procedure was performed using microdissection and microsurgical technique.  The posterior osteophytic overgrowth was removed using the Micromax drill as  well as a 10 mm Kerrison punch with a thin foot plate.  The posterior  longitudinal ligament which was spondylitic was carefully incised and  removed and then we examined the foramina and decompressed them.  Within the  right C6-7 foramina we encountered a fairly large fragment of disk  herniation.  This was removed and we were able to decompress  the right C7  nerve root.  The thecal sac and spinal canal were decompressed as was the  left C7 nerve root and foramen.  Once all the neural surfaces were  decompressed and the diskectomy completed.  Hemostasis established with the  use of Gelfoam soaked in thrombin.  All the Gelfoam though was removed prior  to arthrodesis.  We selected a wedge of iliac crest allograft.  It was  soaked in saline solution and then subsequently cut and shaped using a  Micromax drill and positioned in the intervertebral disk space and  countersunk and then we selected 24 mm Trinica cervical plate.  It was  positioned over the fusion construct and secured to the vertebra with a pair  of 4.2 x 14 mm screws in each vertebra.  Each screw hole was drilled and  tapped.  Screws were placed in alternating fashion.  All four screws were  tightened and then the locking system was  secured.  An x-ray was taken which  showed the graft, plate and screws were all in good position.  The alignment  was good and then the wound was irrigated with bacitracin solution and  checked for hemostasis which was established and confirmed.  Then we  proceeded with closure.  The platysma was closed with inverted interrupted 3-  0 undyed Vicryl sutures, the subcutaneous layer was closed with inverted  interrupted 3-0 undyed Vicryl sutures.  The skin was reapproximated with  Dermabond.  The patient tolerated the procedure well.  The estimated blood  loss was 25 cc.  Sponge, needle and instrument counts were correct.  Following surgery the patient had been taken out of traction once the bone  graft was placed.  He was placed in a soft cervical collar, reversed from  anesthetic to be extubated and transferred to the recovery room for further  care.                                                 Hewitt Shorts, M.D.    RWN/MEDQ  D:  12/18/2001  T:  12/18/2001  Job:  [id number]

## 2010-07-30 ENCOUNTER — Telehealth: Payer: Self-pay | Admitting: Internal Medicine

## 2010-07-30 NOTE — Telephone Encounter (Signed)
If patient not already notified (could find no documentation) please call U/A negative

## 2010-08-01 NOTE — Telephone Encounter (Signed)
Patient informed. 

## 2010-08-11 ENCOUNTER — Ambulatory Visit (INDEPENDENT_AMBULATORY_CARE_PROVIDER_SITE_OTHER): Payer: 59 | Admitting: Internal Medicine

## 2010-08-11 ENCOUNTER — Encounter: Payer: Self-pay | Admitting: Internal Medicine

## 2010-08-11 DIAGNOSIS — I10 Essential (primary) hypertension: Secondary | ICD-10-CM

## 2010-08-11 DIAGNOSIS — M4800 Spinal stenosis, site unspecified: Secondary | ICD-10-CM

## 2010-08-11 DIAGNOSIS — M2390 Unspecified internal derangement of unspecified knee: Secondary | ICD-10-CM

## 2010-08-11 NOTE — Progress Notes (Signed)
  Subjective:    Patient ID: [name], female    DOB: 10/04/1950, 60 y.o.   MRN: [id number]  HPI [name] presents for follow-up after being seen as a new patient April 23rd. In the interval she has had continued low back pain and she is scheduled for spinal stenosis surgery. She has had two opinions: Dr. Newell Coral proposed an extensive surgery and that she may be out of work for several months.; second opinion from Dr. Gerlene Fee who is proposing minimally invasive surgery with spacer and fusion with less time out from work.  I have reviewed the patient's medical history in detail and updated the computerized patient record.    Review of Systems Review of Systems  Constitutional:  Negative for fever, chills, activity change and unexpected weight change.  HENT:  Negative for hearing loss, ear pain, congestion, neck stiffness and postnasal drip.   Eyes: Negative for pain, discharge and visual disturbance.  Respiratory: Negative for chest tightness and wheezing.   Cardiovascular: Negative for chest pain and palpitations.       [No decreased exercise tolerance Gastrointestinal: [No change in bowel habit. No bloating or gas. No reflux or indigestion Genitourinary: Negative for urgency, frequency, flank pain and difficulty urinating.  Musculoskeletal: Negative for myalgias, back pain, arthralgias and gait problem.  Neurological: Negative for dizziness, tremors, weakness and headaches.  Hematological: Negative for adenopathy.  Psychiatric/Behavioral: Negative for behavioral problems and dysphoric mood.       Objective:   Physical Exam Vitals reviewed HEENT - [place]/AT, C&S clear, oropharynx without lesions, posterior phyarnx clear Neck - supple Nodes - negative cervical, supraclavicular. Chest - no deformity Lungs - CTAP Breast - deferred Cor - RRR without murmur, rubs or gallops Abd- soft, BS+ Back - able to stand w/o assist, flex to 180 degrees, nl gait, toe/heel walk ok, able  to step-up to exam table but with discomfort- did not try to step up using left leg due to knee pain, nl SLR sitting, nl patellar tendon reflexes Extremities - no deformity Neuro - non-focal exam Derm - clear        Assessment & Plan:

## 2010-08-12 ENCOUNTER — Telehealth: Payer: Self-pay | Admitting: *Deleted

## 2010-08-12 NOTE — Telephone Encounter (Signed)
Pt is feeling better after resting and dramamine. She declined offer to call in rx and will call back with any further problems.

## 2010-08-12 NOTE — Telephone Encounter (Signed)
Try meclizine 12.5 mg every 6 hours as needed. Can call in Rx for #30 with 2 refills. Hold the allegra while taking the meclizine.

## 2010-08-12 NOTE — Telephone Encounter (Signed)
Patient requesting RX for vertigo. Please advise.

## 2010-08-14 DIAGNOSIS — M4800 Spinal stenosis, site unspecified: Secondary | ICD-10-CM | POA: Insufficient documentation

## 2010-08-14 HISTORY — DX: Spinal stenosis, site unspecified: M48.00

## 2010-08-14 NOTE — Assessment & Plan Note (Signed)
Patient with progressive multi-level spinal stenosis. She is in a dilemma with two surgical opinions. Advised to give weight to the surgeon who has already operated and knows the situation perhaps better.   Plan- she will make her decision and schedule her surgery.

## 2010-08-14 NOTE — Assessment & Plan Note (Signed)
Stable BP controll

## 2010-08-14 NOTE — Assessment & Plan Note (Signed)
Continued pain in the left knee. Workman's comp issues are still alive and pending.

## 2010-08-15 ENCOUNTER — Other Ambulatory Visit: Payer: Self-pay | Admitting: *Deleted

## 2010-08-15 ENCOUNTER — Telehealth: Payer: Self-pay | Admitting: *Deleted

## 2010-08-15 MED ORDER — LISINOPRIL-HYDROCHLOROTHIAZIDE 10-12.5 MG PO TABS: 1.0000 | ORAL_TABLET | Freq: Every day | ORAL | Status: DC

## 2010-08-15 MED ORDER — AMITRIPTYLINE HCL 10 MG PO TABS: 10.0000 mg | ORAL_TABLET | Freq: Two times a day (BID) | ORAL | Status: DC

## 2010-08-15 NOTE — Telephone Encounter (Signed)
o for 90 day supply

## 2010-08-15 NOTE — Telephone Encounter (Signed)
Received fax from [facility] [id number]. Clonazepam 1mg  tablet. SIG take 1 tablet by mouth at bedtime as needed. Pt wants a 90 day supply. Please Advise.

## 2010-08-16 MED ORDER — CLONAZEPAM 1 MG PO TABS: 1.0000 mg | ORAL_TABLET | ORAL | Status: DC | PRN

## 2010-08-17 ENCOUNTER — Encounter: Payer: Self-pay | Admitting: Internal Medicine

## 2010-08-31 ENCOUNTER — Encounter (HOSPITAL_COMMUNITY)
Admission: RE | Admit: 2010-08-31 | Discharge: 2010-08-31 | Disposition: A | Discharge status: Home / Self Care | Payer: 59 | Source: Ambulatory Visit | Attending: Neurosurgery | Admitting: Neurosurgery

## 2010-08-31 LAB — BASIC METABOLIC PANEL
BUN: 23 mg/dL (ref 6–23)
CO2: 30 mEq/L (ref 19–32)
Calcium: 10.2 mg/dL (ref 8.4–10.5)
Chloride: 102 mEq/L (ref 96–112)
Creatinine, Ser: 0.77 mg/dL (ref 0.50–1.10)
GFR calc Af Amer: >60 mL/min (ref >60)
GFR calc non Af Amer: >60 mL/min (ref >60)
Glucose, Bld: 79 mg/dL (ref 70–99)
Potassium: 4.6 mEq/L (ref 3.5–5.1)
Sodium: 141 mEq/L (ref 135–145)

## 2010-08-31 LAB — CBC
HCT: 41.2 % (ref 36.0–46.0)
Hemoglobin: 14.2 g/dL (ref 12.0–15.0)
MCH: 30.1 pg (ref 26.0–34.0)
MCHC: 34.5 g/dL (ref 30.0–36.0)
MCV: 87.5 fL (ref 78.0–100.0)
Platelets: 277 10*3/uL (ref 150–400)
RBC: 4.71 MIL/uL (ref 3.87–5.11)
RDW: 13.9 % (ref 11.5–15.5)
WBC: 7.4 10*3/uL (ref 4.0–10.5)

## 2010-08-31 LAB — TYPE AND SCREEN
ABO/RH(D): A POS
Antibody Screen: NEGATIVE

## 2010-08-31 LAB — SURGICAL PCR SCREEN
MRSA, PCR: NEGATIVE
Staphylococcus aureus: NEGATIVE

## 2010-09-01 ENCOUNTER — Inpatient Hospital Stay (HOSPITAL_COMMUNITY)
Admission: RE | Admit: 2010-09-01 | Discharge: 2010-09-03 | DRG: 460 | Disposition: A | Discharge status: Home Health | Payer: 59 | Source: Ambulatory Visit | Attending: Neurosurgery | Admitting: Neurosurgery

## 2010-09-01 ENCOUNTER — Inpatient Hospital Stay (HOSPITAL_COMMUNITY): Payer: 59

## 2010-09-01 DIAGNOSIS — M4310 Spondylolisthesis, site unspecified: Secondary | ICD-10-CM | POA: Diagnosis present

## 2010-09-01 DIAGNOSIS — Z01818 Encounter for other preprocedural examination: Secondary | ICD-10-CM

## 2010-09-01 DIAGNOSIS — M5126 Other intervertebral disc displacement, lumbar region: Principal | ICD-10-CM | POA: Diagnosis present

## 2010-09-01 DIAGNOSIS — Z01812 Encounter for preprocedural laboratory examination: Secondary | ICD-10-CM

## 2010-09-02 ENCOUNTER — Inpatient Hospital Stay (HOSPITAL_COMMUNITY): Payer: 59

## 2010-09-02 HISTORY — PX: OTHER SURGICAL HISTORY: SHX169

## 2010-09-02 MED ORDER — IOHEXOL 300 MG/ML  SOLN
100.0000 mL | Freq: Once | INTRAMUSCULAR | Status: AC | PRN
  Administered 2010-09-02: 100 mL via INTRAVENOUS

## 2010-09-08 NOTE — Op Note (Signed)
[name], FLEEK NO.:  [id number]  MEDICAL RECORD NO.:  [id number]  LOCATION:  3010                         FACILITY:  [facility]  PHYSICIAN:  Reinaldo Meeker, M.D. DATE OF BIRTH:  [birth year]  DATE OF PROCEDURE:  09/01/2010 DATE OF DISCHARGE:                              OPERATIVE REPORT   PREOPERATIVE DIAGNOSIS:  Herniated disk with listhesis and stenosis L4-5 left.  POSTOPERATIVE DIAGNOSIS:  Herniated disk with listhesis and stenosis L4- 5 left.  PROCEDURE:  Left L4-5 with maximum-access transforaminal lumbar interbody fusion with Zimmer bony interbody spacer followed by NuVasive pedicle screw fixation at L4-5.  SURGEON:  Reinaldo Meeker, MD  ASSISTANT:  Tia Alert, MD  PROCEDURE IN DETAIL:  After being placed in the prone position, the patient's back was prepped and draped in usual sterile fashion.  AP fluoroscopy was used to pass Jamshidi needles through the pedicles of L4 and L5.  These were passed from a lateral to medial direction and followed down.  Through AP and lateral fluoroscopy along with intraoperative EMG monitoring showed no evidence of breech in the pedicle.  The Jamshidi needles were in good position.  We passed a guidewire through them, we moved the needles.  We then connected the two small incisions and controlled any bleeding with unipolar coagulation. We then did sequential dilation starting at L5.  We then tapped with a 5.5-mm tap and placed a 6.5 x 40-mm screw with the right retractor blade attached.  We then did a similar procedure at L4.  At this level placed a 6.5 x 40-mm screw with the left retractor blade attached.  We then secured the retractor blades to the retractor, attached the arm to the table, placed the medial blade and opened things in all directions.  We then placed the light sources for good visualization.  We then took approximately 10 minutes to clean off the spinous processes and lamina facet joint at  L4-5 on the left.  We then did a generous laminotomy by removing the inferior 80% of the L4 lamina, the medial three-quarters of the facet joint and superior one-half of the L5 lamina.  Residual element was removed and saved for use later in the case along with bony shavings.  We then undercut the spinous process of lamina on the opposite side to do a sublaminar decompression until we could actually reach the lateral recess on the opposite side and this was successfully done.  At this time, we incised the disk at L4-5 and thoroughly cleaned it out with a right pituitary rongeurs and curettes.  A small disk space was cleaned out.  We got progressively better relaxation of the thecal sac and L5 nerve root.  L4 nerve was also visualized as well.  We then prepared the disk for interbody fusion.  We distracted up to a 10 mm size and was a good size.  We used rotating cutter to clean it out further.  We then placed a mixture of EquivaBone, Osteocel Plus, and autologous bone deep within interspace to help with the interbody fusion.  We then impacted a 10 x 25 mm graft which was 10.5 wide and kicked it across the  midline as best possible.  It was found in excellent position under fluoroscopy.  We then irrigated the wound copiously and checked for decompression at the L4-L5 nerve roots more so than needed for interbody fusion.  We then removed the hoop shim from the top of the screws, attached the polyaxial heads, and advanced the screws in their final position.  We then chose a 27.5-mm a dual ball rod and secured via top loading nuts.  We then did final tightening with torque and counter torque.  We then removed the retractor, checked with AP and lateral fluoroscopy, this showed excellent placement of the bony spacer along with the screws and rod.  We then irrigated once more and controlled any bleeding with unipolar coagulation.  We then closed the wound with inverted Vicryl on the fascia,  subcutaneous and subcuticular tissues, and staples on the skin.  We placed a Lidoderm patch around the incision and dressed the incision in standard fashion. The patient was then extubated and taken to recovery room in stable condition.          ______________________________ Reinaldo Meeker, M.D.     ROK/MEDQ  D:  09/01/2010  T:  09/02/2010  Job:  [id number]  Electronically Signed by Aliene Beams M.D. on 09/08/2010 10:38:10 AM

## 2010-09-13 ENCOUNTER — Other Ambulatory Visit: Payer: Self-pay | Admitting: Neurosurgery

## 2010-09-13 DIAGNOSIS — M545 Low back pain, unspecified: Secondary | ICD-10-CM

## 2010-09-15 ENCOUNTER — Ambulatory Visit (HOSPITAL_COMMUNITY)
Admission: RE | Admit: 2010-09-15 | Discharge: 2010-09-15 | Disposition: A | Discharge status: Home / Self Care | Payer: 59 | Source: Ambulatory Visit | Attending: Neurosurgery | Admitting: Neurosurgery

## 2010-09-15 ENCOUNTER — Other Ambulatory Visit: Payer: Self-pay | Admitting: Neurosurgery

## 2010-09-15 DIAGNOSIS — M545 Low back pain, unspecified: Secondary | ICD-10-CM

## 2010-09-15 DIAGNOSIS — M79609 Pain in unspecified limb: Secondary | ICD-10-CM | POA: Insufficient documentation

## 2010-09-15 DIAGNOSIS — M5137 Other intervertebral disc degeneration, lumbosacral region: Secondary | ICD-10-CM | POA: Insufficient documentation

## 2010-09-15 DIAGNOSIS — R209 Unspecified disturbances of skin sensation: Secondary | ICD-10-CM | POA: Insufficient documentation

## 2010-09-15 DIAGNOSIS — M79661 Pain in right lower leg: Secondary | ICD-10-CM

## 2010-09-15 DIAGNOSIS — IMO0002 Reserved for concepts with insufficient information to code with codable children: Secondary | ICD-10-CM | POA: Insufficient documentation

## 2010-09-15 DIAGNOSIS — M48061 Spinal stenosis, lumbar region without neurogenic claudication: Secondary | ICD-10-CM | POA: Insufficient documentation

## 2010-09-15 DIAGNOSIS — Y838 Other surgical procedures as the cause of abnormal reaction of the patient, or of later complication, without mention of misadventure at the time of the procedure: Secondary | ICD-10-CM | POA: Insufficient documentation

## 2010-09-15 DIAGNOSIS — I739 Peripheral vascular disease, unspecified: Secondary | ICD-10-CM

## 2010-09-15 DIAGNOSIS — M51379 Other intervertebral disc degeneration, lumbosacral region without mention of lumbar back pain or lower extremity pain: Secondary | ICD-10-CM | POA: Insufficient documentation

## 2010-09-15 MED ORDER — GADOBENATE DIMEGLUMINE 529 MG/ML IV SOLN
15.0000 mL | Freq: Once | INTRAVENOUS | Status: AC | PRN
  Administered 2010-09-15: 15 mL via INTRAVENOUS

## 2010-09-16 ENCOUNTER — Ambulatory Visit
Admission: RE | Admit: 2010-09-16 | Discharge: 2010-09-16 | Disposition: A | Discharge status: Home / Self Care | Payer: 59 | Source: Ambulatory Visit | Attending: Neurosurgery | Admitting: Neurosurgery

## 2010-09-16 ENCOUNTER — Telehealth: Payer: Self-pay | Admitting: *Deleted

## 2010-09-16 DIAGNOSIS — M4800 Spinal stenosis, site unspecified: Secondary | ICD-10-CM

## 2010-09-16 DIAGNOSIS — M79661 Pain in right lower leg: Secondary | ICD-10-CM

## 2010-09-16 NOTE — Telephone Encounter (Signed)
Husband called - Pt had surgery thru [place] [facility] and had a "falling out". He is req referral to [facility] in [facility] ASAP b/c pt is in significant pain.

## 2010-09-19 NOTE — Telephone Encounter (Signed)
Will ok referral to Dr. Nadene Rubins - done

## 2010-09-19 NOTE — Telephone Encounter (Signed)
[facility] Fellers called again - Pt is on 4th round of prednisone. Says "something happened" during surgery which "did damage to right side". Prednisone is continuing to be prescribed in order for her to "function" and MD that did surgery "does not know what is going on". They are anxious to get referral.

## 2010-09-20 ENCOUNTER — Ambulatory Visit
Admission: RE | Admit: 2010-09-20 | Discharge: 2010-09-20 | Disposition: A | Discharge status: Home / Self Care | Payer: 59 | Source: Ambulatory Visit | Attending: Neurosurgery | Admitting: Neurosurgery

## 2010-09-20 DIAGNOSIS — I739 Peripheral vascular disease, unspecified: Secondary | ICD-10-CM

## 2010-09-20 NOTE — Telephone Encounter (Signed)
Patient informed - she also has apt w/Dr Norins in am to discuss further.

## 2010-09-21 ENCOUNTER — Ambulatory Visit (INDEPENDENT_AMBULATORY_CARE_PROVIDER_SITE_OTHER): Payer: 59 | Admitting: Internal Medicine

## 2010-09-21 DIAGNOSIS — F418 Other specified anxiety disorders: Secondary | ICD-10-CM

## 2010-09-21 DIAGNOSIS — G8929 Other chronic pain: Secondary | ICD-10-CM

## 2010-09-21 DIAGNOSIS — I10 Essential (primary) hypertension: Secondary | ICD-10-CM

## 2010-09-21 DIAGNOSIS — F341 Dysthymic disorder: Secondary | ICD-10-CM

## 2010-09-21 DIAGNOSIS — M4800 Spinal stenosis, site unspecified: Secondary | ICD-10-CM

## 2010-09-21 HISTORY — DX: Other specified anxiety disorders: F41.8

## 2010-09-21 MED ORDER — CLONAZEPAM 1 MG PO TABS: 1.0000 mg | ORAL_TABLET | ORAL | Status: DC | PRN

## 2010-09-21 MED ORDER — SERTRALINE HCL 50 MG PO TABS: 50.0000 mg | ORAL_TABLET | Freq: Every day | ORAL | Status: DC

## 2010-09-21 MED ORDER — OXYCODONE-ACETAMINOPHEN 5-325 MG PO TABS: ORAL_TABLET | ORAL | Status: DC

## 2010-09-21 MED ORDER — GABAPENTIN 300 MG PO CAPS: ORAL_CAPSULE | ORAL | Status: DC

## 2010-09-21 MED ORDER — MELOXICAM 15 MG PO TABS: 15.0000 mg | ORAL_TABLET | Freq: Every day | ORAL | Status: DC

## 2010-09-21 NOTE — Patient Instructions (Signed)
Back issues - we will work on an urgent referral to [facility] hospital to their neuro-surgery department.  Pain management - need to come off of prednisone. Will increase gabapentin: go to 300 mg three times a day for 3 days, then 300mg  AM, Noon and 600mg  PM for 3 days, then 300mg  AM and 600mg  Noon and PM for three days then 600 mg three times a day. After being on this dose for 1 week we will reassess your pain level before increasing the dose further. Take mobic (meloxicam 15 mg once a day). OK to continue with the percocet every 6 hours. Circulation - the darkening of the toes is a small vessel issue: slow capillary flow that will cause darkening of the feet when they are dependent. No treatment is required. Anxiety and depression - will add zoloft 50mg  once a day.

## 2010-09-22 ENCOUNTER — Encounter: Payer: Self-pay | Admitting: Internal Medicine

## 2010-09-22 DIAGNOSIS — G8929 Other chronic pain: Secondary | ICD-10-CM

## 2010-09-22 HISTORY — DX: Other chronic pain: G89.29

## 2010-09-22 NOTE — Assessment & Plan Note (Signed)
Patient with recent minimally invasive surgery for L4-5 fusion. She is having less pain left leg but is having more pain right leg. She now has a very complex post-surgical situation with possible need for further intervention.  Plan - at her request will refer to neurosurgery at [facility]

## 2010-09-22 NOTE — Assessment & Plan Note (Signed)
BP Readings from Last 3 Encounters:  09/21/10 122/80  08/11/10 128/80  07/04/10 128/80   Good control

## 2010-09-22 NOTE — Progress Notes (Signed)
Subjective:    Patient ID: [name], female    DOB: [birth year], 60 y.o.   MRN: [id number]  HPI [name] presents for pain management. She has a history of spinal stenosis and previous back surgery. She brought complete notes from Dr. Newell Coral that are to be scanned into the chart. She had recurrent radicular pain left leg and has very recently under-gone minimally invasive surgery with Dr. Gerlene [name], records scanned. Post-operatively she had right leg pain that is radicular in nature. She has at this point decided to seek a third consultant and is requesting assistnance with a referral to Drs Lorenso Courier or Andrey Campanile, dept Neurosurgery at [facility]. In the meantime she will need help with pain management. Currently she is taking percocet, prednisone, gabapentin.  Since her surgery she will develop dusky discoloration of the distal LE when her legs are dependent. She has had recent LE arterial dopplers (report scanned) with normal ABIs and she had LE venous doppler that was negative for DVT. Her feet will feel cold but they are not painful.  Past Medical History  Diagnosis Date  . Hypertension   . Migraine headache     hormonal related - less frequent off hormone replacement  . Synovial cyst of lumbar facet joint     lower back - s/p surgery   . Derangement of knee, left 2009    started with fall at work. Has had repair patella and torn meniscus   Past Surgical History  Procedure Date  . Ovarian cyst surgery 1972  . Appendectomy 1971  . Cervical discectomy 1999    diskectomy with fixation:plate and screws  . Synovial cyst excision 2007    lumbar spine  . Patellar reefing '09    repair of fractured patella after fall  . Knee arthroscopy w/ meniscal repair 09    left knee  . Venous ablation     for pain in the groin - VVTS did procedure. Has some residual discomfort at the  ablation site.   . L4-5 lumbar fusion September 02, 2010    Dr. Gerlene [name]:  minimally invasive transforaminal  interbody fusion with spacer   Family History  Problem Relation Age of Onset  . Diabetes Mother   . COPD Mother   . Aortic aneurysm Mother   . Hypertension Mother   . Cancer Father     lung  . Hyperlipidemia Father   . Hypertension Father   . Heart disease Father   . Diabetes Father   . Diabetes Brother   . Hypothyroidism Mother    History   Social History  . Marital Status: Widowed    Spouse Name: N/A    Number of Children: N/A  . Years of Education: 14   Occupational History  . 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    Social History Main Topics  . Smoking status: Never Smoker   . Smokeless tobacco: Not on file  . Alcohol Use: No  . Drug Use: Not on file  . Sexually Active: Yes -- Female partner(s)   Other Topics Concern  . 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 1 dtr- '72; 1 son '73; 4 grandchildren, 2 step-grands. 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           Review of Systems Review of Systems  Constitutional:  Negative for fever, chills, activity change and unexpected weight  change.  HEENT:  Negative for hearing loss, ear pain, congestion, neck stiffness and postnasal drip. Negative for sore throat or swallowing problems. Negative for dental complaints.   Eyes: Negative for vision loss or change in visual acuity.  Respiratory: Negative for chest tightness and wheezing.   Cardiovascular: Negative for chest pain and palpitation. decreased exercise tolerance due to orthopedic issues Gastrointestinal: No change in bowel habit. No bloating or gas. No reflux or indigestion Genitourinary: Negative for urgency, frequency, flank pain and difficulty urinating.  Musculoskeletal: Negative for myalgias, back pain, arthralgias and gait problem.  Neurological: Negative for dizziness, tremors, weakness and headaches.  Hematological:  Negative for adenopathy.  Psychiatric/Behavioral: Positive for dysphoric mood.       Objective:   Physical Exam Vitals noted Gen'l WNWD white woman in a body brace in no acute distress HEENT C&S clear Pulmonaary - normal respirations Cor 2+ radial pulse, regular, 2+ DP pulses, slow capillary refill at the distal feet, no skin breakdown Psych - depressed mood and anxious       Assessment & Plan:

## 2010-09-22 NOTE — Assessment & Plan Note (Signed)
Significant situational anxiety and depression.  Plan - will start zoloft 50mg  once a day.           Continue prn alprazolam and klonipin at night.           Follow-up OV 3-4 weeks

## 2010-09-26 ENCOUNTER — Telehealth: Payer: Self-pay

## 2010-09-26 NOTE — Telephone Encounter (Signed)
Message routed to both [facility]

## 2010-09-26 NOTE — Telephone Encounter (Signed)
amb ref to neurosurgery sent to [facility] July 11th-documented in my note. Please check with [facility] about this!!

## 2010-09-26 NOTE — Telephone Encounter (Signed)
Pt left message requesting status of referral to [facility] to see a Neurosurgeon for pain. She states that she was told that she would receive a call with an appointment within 3 days and today is day 3. No referral is placed for neuro at [facility]. Please advise

## 2010-09-26 NOTE — Telephone Encounter (Signed)
Pt notified [facility] is working on referral right now

## 2010-10-12 ENCOUNTER — Ambulatory Visit: Payer: 59 | Admitting: Internal Medicine

## 2010-11-11 ENCOUNTER — Telehealth: Payer: Self-pay | Admitting: *Deleted

## 2010-11-11 MED ORDER — CLONAZEPAM 1 MG PO TABS: 1.0000 mg | ORAL_TABLET | ORAL | Status: DC | PRN

## 2010-11-11 NOTE — Telephone Encounter (Signed)
Called in refill  

## 2010-11-11 NOTE — Telephone Encounter (Signed)
Refill request on clonazepam 1 mg SIG take one tablet by mouth at bedtime as needed. Last fill 10/13/2010 Please Advise refills

## 2010-11-16 ENCOUNTER — Encounter: Payer: Self-pay | Admitting: Internal Medicine

## 2010-11-16 ENCOUNTER — Ambulatory Visit (INDEPENDENT_AMBULATORY_CARE_PROVIDER_SITE_OTHER): Payer: 59 | Admitting: Internal Medicine

## 2010-11-16 DIAGNOSIS — R42 Dizziness and giddiness: Secondary | ICD-10-CM

## 2010-11-16 DIAGNOSIS — I10 Essential (primary) hypertension: Secondary | ICD-10-CM

## 2010-11-16 MED ORDER — MECLIZINE HCL 12.5 MG PO TABS: 12.5000 mg | ORAL_TABLET | Freq: Three times a day (TID) | ORAL | Status: DC | PRN

## 2010-11-16 MED ORDER — HYDROCHLOROTHIAZIDE 25 MG PO TABS: 25.0000 mg | ORAL_TABLET | Freq: Every day | ORAL | Status: DC

## 2010-11-16 NOTE — Patient Instructions (Signed)
It was good to see you today. We have reviewed your prior records including labs and tests today Stop lisinopril and use JUST plain hydrochlorothiazide for blood pressure control - also try low dose meclizine as needed for dizzy symptoms   Your prescription(s) have been submitted to your pharmacy. Please take as directed and contact our office if you believe you are having problem(s) with the medication(s). Please schedule followup in 1-2 weeks with Dr. Debby Bud for blood pressure check and medication review, call sooner if problems.

## 2010-11-16 NOTE — Progress Notes (Signed)
  Subjective:    Patient ID: [name], female    DOB: 1950-11-23, 60 y.o.   MRN: [id number]  HPI complains dizziness and paroxysmal elevations of blood pressure  associated with nausea and HR 90s Onset 1 week ago, gradually progressive symptoms  Intermittent spells, none present at this time Complicated by pain in knee, leg and back - unable to tolerate NSAIDs due to current BP med No medication changes but would like to stop ACEI due to interactions with NSAIDs  Past Medical History  Diagnosis Date  . Hypertension   . Migraine headache     hormonal related - less frequent off hormone replacement  . Synovial cyst of lumbar facet joint     lower back - s/p surgery   . Derangement of knee, left 2009    started with fall at work. Has had repair patella and torn meniscus    Review of Systems  Constitutional: Positive for fatigue.  Respiratory: Negative for shortness of breath.   Cardiovascular: Negative for chest pain.  Neurological: Negative for numbness and headaches.       Objective:   Physical Exam BP 130/74  Pulse 92  Temp(Src) 98 F (36.7 C) (Oral)  Ht 5\' 6"  (1.676 m)  SpO2 97% Constitutional: She is oriented to person, place, and time. She appears well-developed and well-nourished. No distress.  HENT: Head: Normocephalic and atraumatic. Ears: B TMs ok, no erythema or effusion; Nose: Nose normal.  Mouth/Throat: Oropharynx is clear and moist. No oropharyngeal exudate.  Eyes: Conjunctivae and EOM are normal. Pupils are equal, round, and reactive to light. No scleral icterus.  Neck: Normal range of motion. Neck supple. No JVD present. No thyromegaly present.  Cardiovascular: Normal rate, regular rhythm and normal heart sounds.  No murmur heard. No BLE edema. Pulmonary/Chest: Effort normal and breath sounds normal. No respiratory distress. She has no wheezes. Neurological: She is alert and oriented to person, place, and time. No cranial nerve deficit. Coordination  normal.    Lab Results  Component Value Date   WBC 7.4 08/31/2010   HGB 14.2 08/31/2010   HCT 41.2 08/31/2010   PLT 277 08/31/2010   CHOL 178 07/18/2010   TRIG 83.0 07/18/2010   HDL 57.90 07/18/2010   ALT 33 07/18/2010   AST 29 07/18/2010   NA 141 08/31/2010   K 4.6 08/31/2010   CL 102 08/31/2010   CREATININE 0.77 08/31/2010   BUN 23 08/31/2010   CO2 30 08/31/2010   TSH 3.19 07/18/2010   INR 0.92 02/11/2010       Assessment & Plan:  See problem list. Medications and labs reviewed today.  Dizzy spells - episodic in past 2 weeks - neuro and ENT exam benign - trial meclizine and adjustment of blood pressure meds - see next

## 2010-11-16 NOTE — Assessment & Plan Note (Signed)
Pt prefers to discontinue ACEI  Will stop prinivil and tx with HCTZ alone - new erx done Reviewed paroxysmal spells reported in past week - may be exac by pain symptoms and anxiety as BP fairly normal now continue to monitor and follow up PCP for recheck and med titration as needed BP Readings from Last 3 Encounters:  11/16/10 130/74  09/21/10 122/80  08/11/10 128/80

## 2011-01-02 DIAGNOSIS — M549 Dorsalgia, unspecified: Secondary | ICD-10-CM

## 2011-01-02 DIAGNOSIS — G8929 Other chronic pain: Secondary | ICD-10-CM | POA: Insufficient documentation

## 2011-01-02 DIAGNOSIS — S32009K Unspecified fracture of unspecified lumbar vertebra, subsequent encounter for fracture with nonunion: Secondary | ICD-10-CM | POA: Insufficient documentation

## 2011-01-02 HISTORY — DX: Unspecified fracture of unspecified lumbar vertebra, subsequent encounter for fracture with nonunion: S32.009K

## 2011-01-11 ENCOUNTER — Other Ambulatory Visit: Payer: Self-pay | Admitting: Internal Medicine

## 2011-01-11 MED ORDER — GABAPENTIN 300 MG PO CAPS: ORAL_CAPSULE | ORAL | Status: DC

## 2011-01-11 NOTE — Telephone Encounter (Signed)
The pt is requesting a refill on Gabapentin 300mg  sent to [facility] pharmacy in [place], [place]  Thanks!

## 2011-01-11 NOTE — Telephone Encounter (Signed)
Ok for refill? 

## 2011-01-11 NOTE — Telephone Encounter (Signed)
Pt left Vm - She says that someone in her family told her she sounded like she has crackles in her lungs. She had back surgery and says driving is not an option. Attempted to call patient, no answer. I left her a detailed VM to call office and schedule apt with Dr Debby Bud for eval asap per Dr Debby Bud

## 2011-01-12 ENCOUNTER — Encounter (HOSPITAL_COMMUNITY): Payer: Self-pay

## 2011-01-12 ENCOUNTER — Emergency Department (HOSPITAL_COMMUNITY): Payer: 59

## 2011-01-12 ENCOUNTER — Inpatient Hospital Stay (HOSPITAL_COMMUNITY)
Admission: EM | Admit: 2011-01-12 | Discharge: 2011-01-13 | DRG: 195 | Disposition: A | Discharge status: Home / Self Care | Payer: 59 | Attending: Family Medicine | Admitting: Family Medicine

## 2011-01-12 DIAGNOSIS — I10 Essential (primary) hypertension: Secondary | ICD-10-CM | POA: Diagnosis present

## 2011-01-12 DIAGNOSIS — M549 Dorsalgia, unspecified: Secondary | ICD-10-CM | POA: Diagnosis present

## 2011-01-12 DIAGNOSIS — D649 Anemia, unspecified: Secondary | ICD-10-CM | POA: Diagnosis present

## 2011-01-12 DIAGNOSIS — J189 Pneumonia, unspecified organism: Principal | ICD-10-CM | POA: Diagnosis present

## 2011-01-12 DIAGNOSIS — R7989 Other specified abnormal findings of blood chemistry: Secondary | ICD-10-CM | POA: Diagnosis present

## 2011-01-12 DIAGNOSIS — Z888 Allergy status to other drugs, medicaments and biological substances status: Secondary | ICD-10-CM

## 2011-01-12 DIAGNOSIS — IMO0001 Reserved for inherently not codable concepts without codable children: Secondary | ICD-10-CM | POA: Diagnosis present

## 2011-01-12 DIAGNOSIS — E860 Dehydration: Secondary | ICD-10-CM | POA: Diagnosis present

## 2011-01-12 DIAGNOSIS — G8929 Other chronic pain: Secondary | ICD-10-CM | POA: Diagnosis present

## 2011-01-12 HISTORY — DX: Acquired absence of other specified parts of digestive tract: Z90.49

## 2011-01-12 HISTORY — DX: Migraine, unspecified, not intractable, without status migrainosus: G43.909

## 2011-01-12 LAB — DIFFERENTIAL
Basophils Absolute: 0.1 10*3/uL (ref 0.0–0.1)
Basophils Relative: 1 % (ref 0–1)
Eosinophils Absolute: 0.4 10*3/uL (ref 0.0–0.7)
Eosinophils Relative: 5 % (ref 0–5)
Lymphocytes Relative: 19 % (ref 12–46)
Lymphs Abs: 1.6 10*3/uL (ref 0.7–4.0)
Monocytes Absolute: 0.9 10*3/uL (ref 0.1–1.0)
Monocytes Relative: 11 % (ref 3–12)
Neutro Abs: 5.4 10*3/uL (ref 1.7–7.7)
Neutrophils Relative %: 65 % (ref 43–77)

## 2011-01-12 LAB — CARDIAC PANEL(CRET KIN+CKTOT+MB+TROPI)
CK, MB: 1.8 ng/mL (ref 0.3–4.0)
Relative Index: INVALID (ref 0.0–2.5)
Total CK: 58 U/L (ref 7–177)
Troponin I: <0.3 ng/mL (ref <0.30)

## 2011-01-12 LAB — CBC
HCT: 31.9 % — ABNORMAL LOW (ref 36.0–46.0)
Hemoglobin: 10.6 g/dL — ABNORMAL LOW (ref 12.0–15.0)
MCH: 29.4 pg (ref 26.0–34.0)
MCHC: 33.2 g/dL (ref 30.0–36.0)
MCV: 88.4 fL (ref 78.0–100.0)
Platelets: 355 10*3/uL (ref 150–400)
RBC: 3.61 MIL/uL — ABNORMAL LOW (ref 3.87–5.11)
RDW: 13.8 % (ref 11.5–15.5)
WBC: 8.2 10*3/uL (ref 4.0–10.5)

## 2011-01-12 LAB — POCT I-STAT, CHEM 8
BUN: 15 mg/dL (ref 6–23)
Calcium, Ion: 1.18 mmol/L (ref 1.12–1.32)
Chloride: 99 mEq/L (ref 96–112)
Creatinine, Ser: 0.9 mg/dL (ref 0.50–1.10)
Glucose, Bld: 96 mg/dL (ref 70–99)
HCT: 33 % — ABNORMAL LOW (ref 36.0–46.0)
Hemoglobin: 11.2 g/dL — ABNORMAL LOW (ref 12.0–15.0)
Potassium: 4 mEq/L (ref 3.5–5.1)
Sodium: 137 mEq/L (ref 135–145)
TCO2: 28 mmol/L (ref 0–100)

## 2011-01-12 LAB — URINALYSIS, ROUTINE W REFLEX MICROSCOPIC
Bilirubin Urine: NEGATIVE
Glucose, UA: NEGATIVE mg/dL
Hgb urine dipstick: NEGATIVE
Ketones, ur: NEGATIVE mg/dL
Leukocytes, UA: NEGATIVE
Nitrite: NEGATIVE
Protein, ur: NEGATIVE mg/dL
Specific Gravity, Urine: 1.009 (ref 1.005–1.030)
Urobilinogen, UA: 0.2 mg/dL (ref 0.0–1.0)
pH: 7.5 (ref 5.0–8.0)

## 2011-01-12 LAB — CK TOTAL AND CKMB (NOT AT ARMC)
CK, MB: 1.8 ng/mL (ref 0.3–4.0)
Relative Index: INVALID (ref 0.0–2.5)
Total CK: 65 U/L (ref 7–177)

## 2011-01-12 LAB — TROPONIN I: Troponin I: <0.3 ng/mL (ref <0.30)

## 2011-01-12 MED ORDER — IOHEXOL 300 MG/ML  SOLN
100.0000 mL | Freq: Once | INTRAMUSCULAR | Status: AC | PRN
  Administered 2011-01-12: 100 mL via INTRAVENOUS

## 2011-01-12 NOTE — H&P (Signed)
[name], [name] NO.:  [id number]  MEDICAL RECORD NO.:  [id number]  LOCATION:  [facility]                         FACILITY:  [facility]  PHYSICIAN:  Talmage Nap, MD  DATE OF BIRTH:  11/17/1950  DATE OF ADMISSION:  01/12/2011 DATE OF DISCHARGE:                             HISTORY & PHYSICAL   History obtainable from the patient and patient's 2 children, that is a man and a woman.  CHIEF COMPLAINT:  Cough and shortness of breath of 1 day's duration.  HISTORY OF PRESENT ILLNESS:  The patient is a 60 year old Caucasian female with history of multiple surgeries in her back, i.e. cervical as well as in lower back and most recently had a surgical screw removed from her lower back, presenting to the emergency room with 1-day history of cough that was said to be nonproductive of sputum.  This was, however, said to be associated with shortness of breath.  She denied any associated pleuritic chest pain.  She denied any fever.  She denied any chills.  She denied any rigor.  She denied any history of PND or orthopnea.  She, however, presented to the emergency room to be evaluated.  PAST MEDICAL HISTORY:  Positive for hypertension and fibromyalgia.  PAST SURGICAL HISTORY: 1. Neck surgery. 2. Lower back surgery, complicated by paresthesia in the right leg and     most recently is lower back surgery with removal of screws done at     [facility].  MEDICATIONS:  Her preadmission meds include, 1. Vitamin D 1000 units 1 p.o. daily. 2. Calcium 1500 mg 1 p.o. daily. 3. Glucosamine chondroitin. 4. Vitamin D3 1 p.o. daily. 5. Vitamin B-complex as directed. 6. Clonazepam 1 mg p.o. at bedtime p.r.n. 7. Amitriptyline HCl 10 mg 2 tablets p.o. daily at bedtime. 8. Hydrochlorothiazide 25 mg 1 p.o. q.a.m. 9. Gabapentin 300 mg 4 tablets 3 times daily p.r.n. 10.Oxycodone 1-2 tablets 5/325 daily p.r.n. for pain. 11.Advil plus Tylenol p.r.n. for pain. 12.Flexeril 10 mg p.r.n.  for spasm.  ALLERGIES:  TO NOVOCAIN AND CELEBREX.  SOCIAL HISTORY:  Negative for alcohol or tobacco use and the patient is currently unemployed because of her lower back surgery complicated by paresthesia in the lower extremity.  FAMILY HISTORY:  York Spaniel to be positive for hypertension and diabetes mellitus.  REVIEW OF SYSTEMS:  The patient denies any history of headaches.  Denies any blurred vision.  Complained about nausea, but no vomiting. Complained about cough that is nonproductive of sputum.  No associated pleuritic chest pain.  Denies any fever.  No chills.  No rigor.  Denies any PND or orthopnea.  No abdominal pain.  No diarrhea or hematochezia. No dysuria or hematuria.  No swelling of the lower extremity.  No intolerance of heat or cold.  Complained about pain in the lower back as well as in the lower extremity (paresthesia).  No intolerance to heat or cold and no neuropsychiatric disorder.  PHYSICAL EXAMINATION:  GENERAL:  Middle-aged lady with slightly suboptimal hydration, not in any respiratory distress. VITAL SIGNS:  Blood pressure is 136/59, pulse is 97, respiratory rate 16, temperature is 98.3. HEENT:  Pupils are reactive to light and extraocular muscles are  intact. NECK:  No jugular venous distention.  No carotid bruit.  No lymphadenopathy. CHEST:  Clear to auscultation. HEART:  Sounds are 1 and 2. ABDOMEN:  Soft, nontender.  Liver, spleen, and kidney not palpable. Bowel sounds are positive. EXTREMITIES:  No pedal edema. NEUROLOGIC:  Nonfocal. MUSCULOSKELETAL SYSTEM:  Show arthritic changes in the knees. NEUROPSYCHIATRIC:  Evaluation is unremarkable. SKIN:  Shows slightly decreased turgor.  LABORATORY DATA:  Urine microscopy unremarkable.  Complete blood count with differential showed WBC 8.2, hemoglobin 10.6, hematocrit 31.9, MCV at 88.4 with a platelet count of 355, normal differential.  Chemistry showed sodium of 137, potassium of 4.0, chloride of 99, BUN is  15, creatinine is 0.90, glucose is 96.  Imaging studies done on patient include chest x-ray and official report says linear opacification in the left lower lobe suspicious for atelectasis and/or pneumonia with small left pleural effusion, however, by my own interpretation the chest x-ray is very clear, no infiltrates seen.  CT angiogram did not show any evidence of pulmonary embolism.  There is, however, small left adrenal nodule fairly consistent with a small adenoma official report.  EKG, not done.  Cardiac enzymes, not done.  IMPRESSION: 1. Questionable upper respiratory tract infection (most likely viral). 2. Dehydration. 3. Fibromyalgia. 4. Hypertension.  PLAN:  Admit the patient to General Medical Floor.  The patient will be slowly rehydrated with normal saline IV to go at rate of 65 mL an hour. She will be given Combivent inhaler 2 puffs 4 times a day p.r.n. for shortness of breath.  In the emergency room, the patient was started on Zosyn.  This, however, will be discontinued since there is no indication for antibiotics now and this is based on patient's symptomatology. Hematological indices did not show any shift and chest x-ray essentially was normal.  The patient will also be on gabapentin 300 mg p.o. at bedtime, Flexeril 10 mg p.o. at bedtime, and she will be on Dilaudid 1 mg IV q.4 hours p.r.n. for moderate pain.  She will be restarted on clonazepam 1 mg p.o. at bedtime, amitriptyline 20 mg p.o. at bedtime, and her blood pressure will be maintained with hydrochlorothiazide 25 mg p.o. daily.  GI prophylaxis will be done with Protonix 40 mg p.o. daily and DVT prophylaxis with TED stockings.  Workup to be done on this patient will include cardiac enzymes q.6 x3, EKG stat, intact parathyroid hormone level, and this test is based on in fact the patient has had numerous orthopedic complaints and a CBC, CMP, and magnesium will be repeated in a.m.  The patient will also have EKG  stat done.  Finally, if the patient is clinically stable in a.m., that is, vital signs are stable, normal hematological and chemistry, the patient most likely will be discharged home.     Talmage Nap, MD     CN/MEDQ  D:  01/12/2011  T:  01/12/2011  Job:  [id number]  Electronically Signed by Talmage Nap  on 01/12/2011 05:38:47 PM

## 2011-01-12 NOTE — Telephone Encounter (Signed)
Pt reports that she was seen at Urgent Care at [facility] last night. Was told she has pneumonia, is anemic and may have a blood clot. Pt was started on abx. Pt reports increased shortness of breath and was very short of breath during our conversation. Verified with Dr Debby Bud that pt should be evaluated in the ER. Pt was advised and voices understanding.

## 2011-01-13 LAB — COMPREHENSIVE METABOLIC PANEL
ALT: 37 U/L — ABNORMAL HIGH (ref 0–35)
AST: 27 U/L (ref 0–37)
Albumin: 3 g/dL — ABNORMAL LOW (ref 3.5–5.2)
Alkaline Phosphatase: 177 U/L — ABNORMAL HIGH (ref 39–117)
BUN: 14 mg/dL (ref 6–23)
CO2: 26 mEq/L (ref 19–32)
Calcium: 9.4 mg/dL (ref 8.4–10.5)
Chloride: 100 mEq/L (ref 96–112)
Creatinine, Ser: 0.83 mg/dL (ref 0.50–1.10)
GFR calc Af Amer: 88 mL/min — ABNORMAL LOW (ref >90)
GFR calc non Af Amer: 76 mL/min — ABNORMAL LOW (ref >90)
Glucose, Bld: 107 mg/dL — ABNORMAL HIGH (ref 70–99)
Potassium: 4 mEq/L (ref 3.5–5.1)
Sodium: 138 mEq/L (ref 135–145)
Total Bilirubin: 0.2 mg/dL — ABNORMAL LOW (ref 0.3–1.2)
Total Protein: 7.1 g/dL (ref 6.0–8.3)

## 2011-01-13 LAB — CBC
HCT: 33 % — ABNORMAL LOW (ref 36.0–46.0)
Hemoglobin: 10.9 g/dL — ABNORMAL LOW (ref 12.0–15.0)
MCH: 29.3 pg (ref 26.0–34.0)
MCHC: 33 g/dL (ref 30.0–36.0)
MCV: 88.7 fL (ref 78.0–100.0)
Platelets: 393 10*3/uL (ref 150–400)
RBC: 3.72 MIL/uL — ABNORMAL LOW (ref 3.87–5.11)
RDW: 13.9 % (ref 11.5–15.5)
WBC: 8.1 10*3/uL (ref 4.0–10.5)

## 2011-01-13 LAB — CARDIAC PANEL(CRET KIN+CKTOT+MB+TROPI)
CK, MB: 1.8 ng/mL (ref 0.3–4.0)
CK, MB: 1.5 ng/mL (ref 0.3–4.0)
Relative Index: INVALID (ref 0.0–2.5)
Relative Index: INVALID (ref 0.0–2.5)
Total CK: 51 U/L (ref 7–177)
Total CK: 51 U/L (ref 7–177)
Troponin I: <0.3 ng/mL (ref <0.30)
Troponin I: <0.3 ng/mL (ref <0.30)

## 2011-01-13 LAB — PARATHYROID HORMONE, INTACT (NO CA): PTH: 26.9 pg/mL (ref 14.0–72.0)

## 2011-01-13 LAB — DIFFERENTIAL
Basophils Absolute: 0 10*3/uL (ref 0.0–0.1)
Basophils Relative: 0 % (ref 0–1)
Eosinophils Absolute: 0.5 10*3/uL (ref 0.0–0.7)
Eosinophils Relative: 6 % — ABNORMAL HIGH (ref 0–5)
Lymphocytes Relative: 20 % (ref 12–46)
Lymphs Abs: 1.6 10*3/uL (ref 0.7–4.0)
Monocytes Absolute: 0.5 10*3/uL (ref 0.1–1.0)
Monocytes Relative: 6 % (ref 3–12)
Neutro Abs: 5.4 10*3/uL (ref 1.7–7.7)
Neutrophils Relative %: 68 % (ref 43–77)

## 2011-01-13 LAB — MAGNESIUM: Magnesium: 2 mg/dL (ref 1.5–2.5)

## 2011-01-14 NOTE — Discharge Summary (Signed)
[name], [name] NO.:  [id number]  MEDICAL RECORD NO.:  [id number]  LOCATION:  5529                         FACILITY:  [facility]  PHYSICIAN:  Brendia Sacks, MD    DATE OF BIRTH:  27-Dec-1950  DATE OF ADMISSION:  01/12/2011 DATE OF DISCHARGE:  01/13/2011                              DISCHARGE SUMMARY   CONDITION ON DISCHARGE:  Improved.  PRIMARY CARE PHYSICIAN:  Dr. Debby Bud.  DISCHARGE DIAGNOSES: 1. Bilateral pneumonia. 2. Stable normocytic anemia. 3. Elevated alkaline phosphatase and ALT of unclear significance. 4. Chronic back pain.  HPI:  This is a 60 year old woman who presented to the emergency room for cough and shortness of breath.  The patient has a history of back surgery at [facility].  She was just released this previous Monday, October 29.  On October 30, she developed some cough symptoms and malaise.  On October 31st, she went to Urgent Care where she was diagnosed with possible pneumonia and right lower lobe infiltrate was seen on chest x-ray as per the urgent care record.  She was placed on Zithromax and referred to follow up with her primary care physician Dr. Debby Bud.  She called Dr. Debby Bud' office to follow up with him but there was concern for possibility of a blood clot given her recent surgery, so she was referred to the emergency room for further evaluation.  In the emergency room, she had chest x-ray which suggested pneumonia and a CAT scan of the chest, which showed no evidence for pulmonary embolism.  HOSPITAL COURSE:  Ms. [name] was admitted to the medical floor.  Her condition is much improved at this time.  She requests to go home.  She is afebrile.  Her pulse is within normal limits as is her blood pressure.  Respirations are within normal limits.  Oxygenation is 99% on room air.  She feels quite well to go home.  She was treated with IV antibiotics yesterday in the emergency room.  She will go home on  oral antibiotics.  Although, she did recently have surgery, I do not suspect MRSA at this point.  Of note, she had no leukocytosis and again she is hemodynamically stable and afebrile.  We will treat her with monotherapy with Levaquin for a total of 8 days of therapy.  Her anemia appears to be stable at least from her office visit on October 31 and can be followed up in the outpatient setting as clinically indicated.  Of note, she did have a mildly elevated alkaline phosphatase and ALT.  These are of uncertain significance.  Previous laboratory studies from May of this year and September 2010 showed a normal hepatic function panel at that time.  She is not on a statin.  This can be further evaluated in the outpatient setting as clinically indicated.  CONSULTATIONS:  None.  PROCEDURES:  None.  IMAGING: 1. Chest x-ray on November 1:  Linear opacity possibly in the left     lower lobe suspicious for atelectasis or pneumonia. 2. CT angiogram of the chest on November 1:  No evidence of pulmonary     embolism.  Bilateral lower lobe and lingular pneumonia and small  bilateral pleural effusions left greater than right.  Small left     adrenal nodule statistically consistent with a small adenoma.  PERTINENT LABORATORY STUDIES: 1. CBC notable for hemoglobin 10.9, which does appear to be stable. 2. Basic metabolic panel within normal limits. 3. Hepatic function panel notable for an alkaline phosphatase of 177     and ALT of 37. 4. Cardiac enzymes negative. 5. Urinalysis is negative.  PHYSICAL EXAMINATION:  Documented in progress notes.  DISCHARGE INSTRUCTIONS:  The patient will be discharged home today.  Her diet is a heart healthy diet.  Activity, increase slowly.  Follow up with Dr. Debby Bud in 1 week.  DISCHARGE MEDICATIONS:  Levaquin 750 mg p.o. daily to complete an 8 day course.  This is the only new medication.  Resume the following medications 1. Amitriptyline 10 mg 2  tablets p.o. nightly. 2. Calcium 1500 mg p.o. daily. 3. Clonazepam 1 mg p.o. nightly as needed for anxiety. 4. Gabapentin 300 mg 2 capsules p.o. t.i.d. 5. Gabapentin 300 mg 2 capsules p.o. b.i.d. as needed for pain. 6. Glucosamine chondroitin over the counter p.o. daily. 7. Hydrochlorothiazide 25 mg p.o. daily. 8. Oxycodone/acetaminophen 5/325 one to two tablets by mouth every 6     hours as needed for pain. 9. Vitamin B complex over the counter p.o. daily. 10.Vitamin D3 1000 units p.o. daily.  I discussed this patient's care with Dr. Debby Bud and I left a message with his nurse (including my cell phone number) notifying him of discharge.  Time coordinating discharge is 45 minutes.     Brendia Sacks, MD     DG/MEDQ  D:  01/13/2011  T:  01/13/2011  Job:  [id number]  cc:   Rosalyn Gess. Norins, MD  Electronically Signed by Brendia Sacks  on 01/14/2011 12:41:45 PM

## 2011-01-20 ENCOUNTER — Ambulatory Visit (INDEPENDENT_AMBULATORY_CARE_PROVIDER_SITE_OTHER): Payer: 59 | Admitting: Internal Medicine

## 2011-01-20 DIAGNOSIS — R0602 Shortness of breath: Secondary | ICD-10-CM

## 2011-01-20 DIAGNOSIS — D509 Iron deficiency anemia, unspecified: Secondary | ICD-10-CM

## 2011-01-20 DIAGNOSIS — M4800 Spinal stenosis, site unspecified: Secondary | ICD-10-CM

## 2011-01-20 MED ORDER — OXYCODONE-ACETAMINOPHEN 5-325 MG PO TABS: 1.0000 | ORAL_TABLET | Freq: Three times a day (TID) | ORAL | Status: AC | PRN

## 2011-01-20 MED ORDER — FERROUS GLUCONATE 324 (38 FE) MG PO TABS: 324.0000 mg | ORAL_TABLET | Freq: Two times a day (BID) | ORAL | Status: DC

## 2011-01-20 MED ORDER — GABAPENTIN 300 MG PO CAPS: 600.0000 mg | ORAL_CAPSULE | Freq: Four times a day (QID) | ORAL | Status: DC

## 2011-01-20 MED ORDER — CLONAZEPAM 1 MG PO TABS: 1.0000 mg | ORAL_TABLET | Freq: Every evening | ORAL | Status: DC | PRN

## 2011-01-20 MED ORDER — HYDROCHLOROTHIAZIDE 25 MG PO TABS: 25.0000 mg | ORAL_TABLET | Freq: Every day | ORAL | Status: DC

## 2011-01-22 DIAGNOSIS — D509 Iron deficiency anemia, unspecified: Secondary | ICD-10-CM | POA: Insufficient documentation

## 2011-01-22 HISTORY — DX: Iron deficiency anemia, unspecified: D50.9

## 2011-01-22 NOTE — Progress Notes (Signed)
Subjective:    Patient ID: [name], female    DOB: 04/28/1950, 60 y.o.   MRN: [id number]  HPI Mrs. [name] presents for follow-up. IN the interval since her last visit she has undergone redo surgery for her lumbar disc disease (see records from [facility]). She has done much better and much of her pain is resolved. She is continuing to make a recovery from her DDD and is much more functional. Her surgical incision is healing but she would like to have this examined. She is still experiencing pain and takes gabapentin and percocet - needing prescription refills on both. She also needs refills on her other medications.   Since her hospital [facility]) discharge she did develop a porductive cough. She was seen at an Urgent Care center and diagnosed with PNA/CAP and there was a question of PE since she had just had surgery. She was referred to [facility] ED and was subsequently admitted. She ruled out for PE by CT angio. She was felt to be stable from a respiratory perspective and was discharged within 24 hours with Rx to complete out-patient oral antibiotic therapy. She is doing better without any respiratory distress and few symptoms.   Past Medical History  Diagnosis Date  . Hypertension   . Migraine headache     hormonal related - less frequent off hormone replacement  . Synovial cyst of lumbar facet joint     lower back - s/p surgery   . Derangement of knee, left 2009    started with fall at work. Has had repair patella and torn meniscus  . Migraines   . Hx of appendectomy    Past Surgical History  Procedure Date  . Ovarian cyst surgery 1972  . Appendectomy 1971  . Cervical discectomy 1999    diskectomy with fixation:plate and screws  . Synovial cyst excision 2007    lumbar spine  . Patellar reefing '09    repair of fractured patella after fall  . Knee arthroscopy w/ meniscal repair 09    left knee  . Venous ablation     for pain in the groin - [facility] did procedure. Has some  residual discomfort at the  ablation site.   . L4-5 lumbar fusion September 02, 2010    Dr. Gerlene [name]:  minimally invasive transforaminal interbody fusion with spacer   Family History  Problem Relation Age of Onset  . Diabetes Mother   . COPD Mother   . Aortic aneurysm Mother   . Hypertension Mother   . Cancer Father     lung  . Hyperlipidemia Father   . Hypertension Father   . Heart disease Father   . Diabetes Father   . Diabetes Brother   . Hypothyroidism Mother    History   Social History  . Marital Status: Widowed    Spouse Name: N/A    Number of Children: N/A  . Years of Education: 14   Occupational History  . 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    Social History Main Topics  . Smoking status: Never Smoker   . Smokeless tobacco: Not on file  . Alcohol Use: No  . Drug Use: Not on file  . Sexually Active: Yes -- Female partner(s)   Other Topics Concern  . 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 1 dtr- '72; 1 son '73; 4 grandchildren, 2 step-grands. 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        Review of Systems  Constitutional: Negative for fever, chills and activity change.  HENT: Negative.   Eyes: Negative.   Respiratory: Positive for cough and shortness of breath. Negative for chest tightness and wheezing.   Cardiovascular: Negative.   Gastrointestinal: Negative.   Musculoskeletal: Positive for back pain. Negative for arthralgias.  Neurological: Negative for dizziness, weakness, numbness and headaches.  Hematological: Negative.   Psychiatric/Behavioral: Negative.        Objective:   Physical Exam Vitals reviewed - no fever, good BP Gen-l- a heavyset white woman in no distress Cor- 2+ radial, RRR Pulm - normal oxygen saturation. Good BS, no rales or wheeze, no increased WOB.        Assessment & Plan:  1. HAP -  patient with question of HAP with respiratory symptoms soon after discharge from hospital. She is completing an outpatinet rnd of oral antibiotics. Her exam is unremarkable.  Plan - complete all antibiotics.

## 2011-01-22 NOTE — Assessment & Plan Note (Signed)
She had original surgery with Dr. Newell Coral. She chose second surgery - minimally invasive micro-surgery with sub-optimal results. She has had a 3 rd surgery at [facility] and is doing much better.   Plan- continue with gabapentin          She will continue with reduced doses of Percocel

## 2011-01-22 NOTE — Assessment & Plan Note (Signed)
Patient with iron deficiency anemia. She has no evidence of GI blood loss. Etiology may be related to blood loss with surgery vs mild anemia of chfronic dissease.  Plan - she is adivsed to take an iron supplement bid - Rx for ferrous gluconate 324 mg elemental iron, bid.

## 2011-02-26 ENCOUNTER — Ambulatory Visit (INDEPENDENT_AMBULATORY_CARE_PROVIDER_SITE_OTHER): Payer: 59

## 2011-02-26 DIAGNOSIS — B9789 Other viral agents as the cause of diseases classified elsewhere: Secondary | ICD-10-CM

## 2011-03-23 ENCOUNTER — Ambulatory Visit (INDEPENDENT_AMBULATORY_CARE_PROVIDER_SITE_OTHER): Payer: 59

## 2011-03-23 DIAGNOSIS — R21 Rash and other nonspecific skin eruption: Secondary | ICD-10-CM

## 2011-03-23 DIAGNOSIS — Z719 Counseling, unspecified: Secondary | ICD-10-CM

## 2011-05-15 ENCOUNTER — Other Ambulatory Visit: Payer: Self-pay

## 2011-05-15 NOTE — Telephone Encounter (Signed)
Faxed Signed refill request for Clonazepam 1 mg # 30 with 5 refills to [facility]. Phoned the pharmacy to confirm prescription was received and they will notify the patient as well.

## 2011-05-16 ENCOUNTER — Encounter: Payer: Self-pay | Admitting: *Deleted

## 2011-05-18 ENCOUNTER — Ambulatory Visit (INDEPENDENT_AMBULATORY_CARE_PROVIDER_SITE_OTHER): Payer: 59 | Admitting: Surgery

## 2011-08-13 ENCOUNTER — Ambulatory Visit (INDEPENDENT_AMBULATORY_CARE_PROVIDER_SITE_OTHER): Payer: BC Managed Care – PPO | Admitting: Family Medicine

## 2011-08-13 VITALS — BP 123/82 | HR 82 | Temp 98.1°F | Resp 16 | Ht 65.0 in | Wt 178.8 lb

## 2011-08-13 DIAGNOSIS — R29890 Loss of height: Secondary | ICD-10-CM

## 2011-08-13 DIAGNOSIS — R197 Diarrhea, unspecified: Secondary | ICD-10-CM

## 2011-08-13 DIAGNOSIS — A071 Giardiasis [lambliasis]: Secondary | ICD-10-CM

## 2011-08-13 MED ORDER — METRONIDAZOLE 500 MG PO TABS: 500.0000 mg | ORAL_TABLET | Freq: Three times a day (TID) | ORAL | Status: AC

## 2011-08-13 MED ORDER — ONDANSETRON 8 MG PO TBDP: 8.0000 mg | ORAL_TABLET | Freq: Three times a day (TID) | ORAL | Status: AC | PRN

## 2011-08-13 NOTE — Progress Notes (Signed)
Is a 61 year old woman who complains of crampy abdominal pain with diarrhea for 48 hours. She's recently been to [place] for her honeymoon and is also taken a trip to the [place] of [place]. She's had hot and cold sweats along with this and bloating in her epigastrium.  She's had no hematochezia and no chronic problems with her bowels. She has no cough, sore throat, rectal pain, or vomiting but she does feel nauseated.  Objective: Patient appears mildly ill but in no acute distress.  Chest is clear  Heart is regular without murmur and no tachycardia  Skin is clear  Abdomen: Hyperactive bowel sounds with no point or localizing tenderness, no rebound and no guarding. She is uncomfortable with palpation of the epigastrium however.  Assessment: Patient's classic signs of Giardia which were had and treat.  Plan: Flagyl 500 twice a day with Zofran for comfort  Patient is going to followup with Dr. nor in for physical later this week the

## 2011-08-13 NOTE — Patient Instructions (Signed)
Giardiasis Giardiasis is an infection of the small intestine with the parasite Giardia intestinalis. Giardia intestinalis cannot be seen with the naked eye. It is often found in unclean (contaminated) water.  CAUSES  Infection can be caused by drinking contaminated water. Giardia intestinalis can also be found in some tap water. SYMPTOMS  An infection causes:  Explosive, foul smelling, watery diarrhea.   A Feeling of sickness in your stomach (nausea).   Abdominal cramps and pain.  It takes about 1 to 2 weeks after ingesting infected water or food to get sick. The illness usually lasts 2 to 4 weeks. Infection in infants and children can be long lasting. DIAGNOSIS  It can be diagnosed by stool exam. Blood tests may be needed. TREATMENT  Medications can be given to shorten the course of the illness. HOME CARE INSTRUCTIONS   In areas of contamination, boil your water if possible. Filtering tap water in areas of contamination removes most Giardia. Cysts of Giardia Intestinalis are resistant to chlorine.   Be careful handling soiled undergarments and diapers. If infection is present, it is easily passed by hand to mouth. Use good hand-washing techniques.   Follow up with your caregiver as directed.  SEEK MEDICAL CARE IF:  You do not get better. Document Released: 02/25/2000 Document Revised: 02/16/2011 Document Reviewed: 10/17/2007 ExitCare Patient Information 2012 [employer]. 

## 2011-08-14 ENCOUNTER — Telehealth: Payer: Self-pay

## 2011-08-14 NOTE — Telephone Encounter (Signed)
The patient is concerned that she is not feeling better. She wants to know if there is something else that she can be prescribed.

## 2011-08-14 NOTE — Telephone Encounter (Signed)
PT STATES SEEN YESTERDAY BY DR DAUB,HAS PARASITE,IS FEELING EVEN WORSE TODAY,WANTS ADVICE.  BEST PHONE [phone]

## 2011-08-15 NOTE — Telephone Encounter (Signed)
PT STATES SHE FEELS LIKE SHE IS TURNING THE CORNER.  ADVISED TO MAKE SURE SHE DRINKS FLUIDS TO PREVENT DEHYDRATION.

## 2011-08-16 ENCOUNTER — Other Ambulatory Visit (INDEPENDENT_AMBULATORY_CARE_PROVIDER_SITE_OTHER): Payer: BC Managed Care – PPO

## 2011-08-16 ENCOUNTER — Ambulatory Visit (INDEPENDENT_AMBULATORY_CARE_PROVIDER_SITE_OTHER): Payer: BC Managed Care – PPO | Admitting: Internal Medicine

## 2011-08-16 ENCOUNTER — Encounter: Payer: Self-pay | Admitting: Internal Medicine

## 2011-08-16 VITALS — BP 114/80 | HR 86 | Temp 98.1°F | Resp 16 | Wt 175.0 lb

## 2011-08-16 DIAGNOSIS — D509 Iron deficiency anemia, unspecified: Secondary | ICD-10-CM

## 2011-08-16 DIAGNOSIS — F341 Dysthymic disorder: Secondary | ICD-10-CM

## 2011-08-16 DIAGNOSIS — M2390 Unspecified internal derangement of unspecified knee: Secondary | ICD-10-CM

## 2011-08-16 DIAGNOSIS — I10 Essential (primary) hypertension: Secondary | ICD-10-CM

## 2011-08-16 DIAGNOSIS — F418 Other specified anxiety disorders: Secondary | ICD-10-CM

## 2011-08-16 DIAGNOSIS — Z0000 Encounter for general adult medical examination without abnormal findings: Secondary | ICD-10-CM

## 2011-08-16 DIAGNOSIS — G8929 Other chronic pain: Secondary | ICD-10-CM

## 2011-08-16 DIAGNOSIS — Z23 Encounter for immunization: Secondary | ICD-10-CM

## 2011-08-16 LAB — IBC PANEL
Iron: 58 ug/dL (ref 42–145)
Saturation Ratios: 17.6 % — ABNORMAL LOW (ref 20.0–50.0)
Transferrin: 236 mg/dL (ref 212.0–360.0)

## 2011-08-16 LAB — COMPREHENSIVE METABOLIC PANEL
ALT: 76 U/L — ABNORMAL HIGH (ref 0–35)
AST: 56 U/L — ABNORMAL HIGH (ref 0–37)
Albumin: 4 g/dL (ref 3.5–5.2)
Alkaline Phosphatase: 90 U/L (ref 39–117)
BUN: 12 mg/dL (ref 6–23)
CO2: 31 mEq/L (ref 19–32)
Calcium: 9 mg/dL (ref 8.4–10.5)
Chloride: 103 mEq/L (ref 96–112)
Creatinine, Ser: 1 mg/dL (ref 0.4–1.2)
GFR: 60.73 mL/min (ref >60.00)
Glucose, Bld: 90 mg/dL (ref 70–99)
Potassium: 4.6 mEq/L (ref 3.5–5.1)
Sodium: 140 mEq/L (ref 135–145)
Total Bilirubin: 0.8 mg/dL (ref 0.3–1.2)
Total Protein: 7.3 g/dL (ref 6.0–8.3)

## 2011-08-16 LAB — CBC WITH DIFFERENTIAL/PLATELET
Basophils Absolute: 0 10*3/uL (ref 0.0–0.1)
Basophils Relative: 0.3 % (ref 0.0–3.0)
Eosinophils Absolute: 0.2 10*3/uL (ref 0.0–0.7)
Eosinophils Relative: 3.2 % (ref 0.0–5.0)
HCT: 44.3 % (ref 36.0–46.0)
Hemoglobin: 15 g/dL (ref 12.0–15.0)
Lymphocytes Relative: 19.4 % (ref 12.0–46.0)
Lymphs Abs: 1.4 10*3/uL (ref 0.7–4.0)
MCHC: 33.8 g/dL (ref 30.0–36.0)
MCV: 89.5 fl (ref 78.0–100.0)
Monocytes Absolute: 0.5 10*3/uL (ref 0.1–1.0)
Monocytes Relative: 6.8 % (ref 3.0–12.0)
Neutro Abs: 5 10*3/uL (ref 1.4–7.7)
Neutrophils Relative %: 70.3 % (ref 43.0–77.0)
Platelets: 238 10*3/uL (ref 150.0–400.0)
RBC: 4.96 Mil/uL (ref 3.87–5.11)
RDW: 14.6 % (ref 11.5–14.6)
WBC: 7.2 10*3/uL (ref 4.5–10.5)

## 2011-08-16 LAB — TSH: TSH: 3.8 u[IU]/mL (ref 0.35–5.50)

## 2011-08-16 MED ORDER — HYDROCHLOROTHIAZIDE 25 MG PO TABS: 25.0000 mg | ORAL_TABLET | Freq: Every day | ORAL | Status: DC

## 2011-08-16 NOTE — Progress Notes (Signed)
Subjective:    Patient ID: [name], female    DOB: [birth year], 61 y.o.   MRN: [id number]  HPI [name] presents for an annual exam. In the interval she has continued to go through rehab for her back - now doing land exercise. She is also getting ready for knee surgery left.   Last visit to Dr. Senaida Ores June '12 with a normal exam. She has had a mammogram.   For her honeymoon she was in [place] - she came back with a diarrheal illness with abdominal cramping, diarrhea, etc. She was seen by Dr. Milus Glazier and diagnosed with Giardia and started on flagyl  Past Medical History  Diagnosis Date  . Hypertension   . Migraine headache     hormonal related - less frequent off hormone replacement  . Synovial cyst of lumbar facet joint     lower back - s/p surgery   . Derangement of knee, left 2009    started with fall at work. Has had repair patella and torn meniscus  . Migraines   . Hx of appendectomy    Past Surgical History  Procedure Date  . Ovarian cyst surgery 1972  . Appendectomy 1971  . Cervical discectomy 1999    diskectomy with fixation:plate and screws  . Synovial cyst excision 2007    lumbar spine  . Patellar reefing '09    repair of fractured patella after fall  . Knee arthroscopy w/ meniscal repair 09    left knee  . Venous ablation     for pain in the groin - [facility] did procedure. Has some residual discomfort at the  ablation site.   . L4-5 lumbar fusion September 02, 2010    Dr. Gerlene Fee:  minimally invasive transforaminal interbody fusion with spacer   Family History  Problem Relation Age of Onset  . Diabetes Mother   . COPD Mother   . Aortic aneurysm Mother   . Hypertension Mother   . Cancer Father     lung  . Hyperlipidemia Father   . Hypertension Father   . Heart disease Father   . Diabetes Father   . Diabetes Brother   . Hypothyroidism Mother    History   Social History  . Marital Status: Widowed    Spouse Name: N/A   Number of Children: N/A  . Years of Education: 14   Occupational History  . 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    Social History Main Topics  . Smoking status: Never Smoker   . Smokeless tobacco: Not on file  . Alcohol Use: No  . Drug Use: Not on file  . Sexually Active: Yes -- Female partner(s)   Other Topics Concern  . 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 1 dtr- '72; 1 son '73; 4 grandchildren, 2 step-grands. 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        Review of Systems Constitutional:  Negative for fever, chills, activity change and unexpected weight change.  HEENT:  Negative for hearing loss, ear pain, congestion, neck stiffness and postnasal drip. Negative for sore throat or swallowing problems. Negative for dental complaints.   Eyes: Negative for vision loss or change in visual acuity.  Respiratory: Negative for chest tightness and wheezing. Negative for DOE.   Cardiovascular: Negative for chest pain or palpitations. No decreased exercise tolerance Gastrointestinal: No change in bowel habit.  No bloating or gas. No reflux or indigestion except for current bout of giardia. Genitourinary: Negative for urgency, frequency, flank pain and difficulty urinating.  Musculoskeletal: Negative for myalgias, back pain, arthralgias and gait problem.  Neurological: Negative for dizziness, tremors, weakness and headaches.  Hematological: Negative for adenopathy.  Psychiatric/Behavioral: Negative for behavioral problems and dysphoric mood.       Objective:   Physical Exam Filed Vitals:   08/16/11 0918  BP: 114/80  Pulse: 86  Temp: 98.1 F (36.7 C)  Resp: 16   Wt Readings from Last 3 Encounters:  08/16/11 175 lb (79.379 kg)  08/13/11 178 lb 12.8 oz (81.103 kg)  01/20/11 193 lb (87.544 kg)   Gen'l - overweight white woman in no  distress HEENT - [place]/AT, C&S clear, PERRLA, EOMI, oropharynx w/o lesions, dentition in good repair, throat clear. Neck- supple, no thyromegaly Nodes - negative cervical and supraclavicular regions Cor- 2+ radial pulse, 2+ DP pulse, Regular rate and rhythm w/o murmurs Pulm - normal respirations. Breast - deferred to gyn Abd- BS+, no HSM, mild tenderness lower quadrants, no masses. Pelvic/Rectal - deferred to gyn Ext - well healed surgical scars lumbo-sacral spine, no deformity of the limbs Neuro - A&O x 3, CN II-XII normal, strength is normal, normal gait.  Lab Results  Component Value Date   WBC 7.2 08/16/2011   HGB 15.0 08/16/2011   HCT 44.3 08/16/2011   PLT 238.0 08/16/2011   GLUCOSE 90 08/16/2011   CHOL 178 07/18/2010   TRIG 83.0 07/18/2010   HDL 57.90 07/18/2010   LDLCALC 104* 07/18/2010   ALT 76* 08/16/2011   AST 56* 08/16/2011   NA 140 08/16/2011   K 4.6 08/16/2011   CL 103 08/16/2011   CREATININE 1.0 08/16/2011   BUN 12 08/16/2011   CO2 31 08/16/2011   TSH 3.80 08/16/2011   INR 0.92 02/11/2010          Assessment & Plan:

## 2011-08-17 DIAGNOSIS — Z0000 Encounter for general adult medical examination without abnormal findings: Secondary | ICD-10-CM

## 2011-08-17 HISTORY — DX: Encounter for general adult medical examination without abnormal findings: Z00.00

## 2011-08-17 NOTE — Assessment & Plan Note (Signed)
Stable on her current regimen. No somnolence. No GI problems

## 2011-08-17 NOTE — Assessment & Plan Note (Signed)
Lab Results  Component Value Date   HGB 15.0 08/16/2011   Problem resolved.

## 2011-08-17 NOTE — Assessment & Plan Note (Signed)
She is completing rehab for her back and plans to move ahead with arthroscopic surgery knee in the not too distant future.

## 2011-08-17 NOTE — Assessment & Plan Note (Signed)
BP Readings from Last 3 Encounters:  08/16/11 114/80  08/13/11 123/82  01/20/11 114/70   Great control.  Plan Continue present meds.

## 2011-08-17 NOTE — Assessment & Plan Note (Signed)
Interval history significant for Giardia - currently finishing treatment. She has otherwise been stable. Limited physical exam is normal. Lab results: TSh (thyroid function) normal, blood counts normal. She had normal lipid panels '09, '11 - no indication to repeat, chemistries normal, mild elevation in liver functions - will need repeat lab in 3 months. She is current with her gynecologist and is current with breast cancer screening. Immunizations: Tdap - today; Shingles vaccine Jan '13.   In summary  -  A very nice woman who is doing OK. She is looking forward to getting her knee done. She will return for lab in 3 months.

## 2011-08-18 ENCOUNTER — Encounter: Payer: Self-pay | Admitting: Internal Medicine

## 2011-08-21 ENCOUNTER — Other Ambulatory Visit: Payer: Self-pay | Admitting: *Deleted

## 2011-08-21 MED ORDER — ESOMEPRAZOLE MAGNESIUM 40 MG PO CPDR: 40.0000 mg | DELAYED_RELEASE_CAPSULE | Freq: Every day | ORAL | Status: DC

## 2011-08-21 NOTE — Telephone Encounter (Signed)
Rx for nexium sent to [facility]

## 2011-08-28 ENCOUNTER — Other Ambulatory Visit: Payer: Self-pay | Admitting: *Deleted

## 2011-08-28 MED ORDER — AMITRIPTYLINE HCL 10 MG PO TABS: 10.0000 mg | ORAL_TABLET | Freq: Two times a day (BID) | ORAL | Status: DC

## 2011-08-28 NOTE — Telephone Encounter (Signed)
Refill sent to [facility] amitriptyline

## 2011-10-02 DIAGNOSIS — Z981 Arthrodesis status: Secondary | ICD-10-CM | POA: Insufficient documentation

## 2011-10-02 HISTORY — DX: Arthrodesis status: Z98.1

## 2011-10-17 ENCOUNTER — Ambulatory Visit (INDEPENDENT_AMBULATORY_CARE_PROVIDER_SITE_OTHER): Payer: BC Managed Care – PPO | Admitting: Family Medicine

## 2011-10-17 ENCOUNTER — Other Ambulatory Visit: Payer: Self-pay | Admitting: Family Medicine

## 2011-10-17 VITALS — BP 124/82 | HR 82 | Temp 98.2°F | Resp 16 | Ht 65.0 in | Wt 179.0 lb

## 2011-10-17 DIAGNOSIS — R5383 Other fatigue: Secondary | ICD-10-CM

## 2011-10-17 DIAGNOSIS — R5381 Other malaise: Secondary | ICD-10-CM

## 2011-10-17 DIAGNOSIS — L609 Nail disorder, unspecified: Secondary | ICD-10-CM

## 2011-10-17 LAB — POCT CBC
Granulocyte percent: 62.2 %G (ref 37–80)
HCT, POC: 45.3 % (ref 37.7–47.9)
Hemoglobin: 14.2 g/dL (ref 12.2–16.2)
Lymph, poc: 2.2 (ref 0.6–3.4)
MCH, POC: 29.4 pg (ref 27–31.2)
MCHC: 31.3 g/dL — AB (ref 31.8–35.4)
MCV: 93.8 fL (ref 80–97)
MID (cbc): 0.4 (ref 0–0.9)
MPV: 8.3 fL (ref 0–99.8)
POC Granulocyte: 4.4 (ref 2–6.9)
POC LYMPH PERCENT: 31.4 %L (ref 10–50)
POC MID %: 6.4 %M (ref 0–12)
Platelet Count, POC: 306 10*3/uL (ref 142–424)
RBC: 4.83 M/uL (ref 4.04–5.48)
RDW, POC: 14 %
WBC: 7 10*3/uL (ref 4.6–10.2)

## 2011-10-17 NOTE — Patient Instructions (Addendum)

## 2011-10-17 NOTE — Progress Notes (Signed)
  Subjective:    Patient ID: [name], female    DOB: 1950/07/20, 61 y.o.   MRN: [id number]  HPI Comments: Concern about right 4th finger nail fungus.  She has had advanced discoloration of the nail over the last week or so.  No pain.  Noted in lasted month or so.  She had gel nails in April and May. She had them removed towards the end of May.  Feeling more tired and fatigued over the last few weeks.  She is concerned about anemia.  History of post op anemia after lumbar surgery for in October.    Anemia      Review of Systems  Constitutional: Negative.   Respiratory: Negative.   Cardiovascular: Negative.   Musculoskeletal: Negative.   Neurological: Negative.        Objective:   Physical Exam  Vitals reviewed. Constitutional: She is oriented to person, place, and time. She appears well-developed and well-nourished.  Neck: Normal range of motion. Neck supple. No thyromegaly present.  Cardiovascular: Normal rate, regular rhythm, normal heart sounds and intact distal pulses.   Lymphadenopathy:    She has no cervical adenopathy.  Neurological: She is alert and oriented to person, place, and time.  Skin: Skin is warm and dry.       Right 4th nail plate partial lifted from bed at distal half with white discoloration.  No thickening or dystrophic changes appreciated.  Psychiatric: She has a normal mood and affect. Her behavior is normal. Judgment and thought content normal.          Assessment & Plan:  Nail disorder - suspect nail trauma, but an infection cannot be ruled out.  Fungal nail culture results pending. Fatigue - obtain CBC, TSH and repeat CMET as she had rising liver enzymes from her last lab work in June of this year.  Follow up with her PCP regarding liver enzymes if repeat testing come back elevated.

## 2011-10-18 LAB — COMPREHENSIVE METABOLIC PANEL
ALT: 22 U/L (ref 0–35)
AST: 22 U/L (ref 0–37)
Albumin: 4.3 g/dL (ref 3.5–5.2)
Alkaline Phosphatase: 81 U/L (ref 39–117)
BUN: 14 mg/dL (ref 6–23)
CO2: 31 mEq/L (ref 19–32)
Calcium: 10.4 mg/dL (ref 8.4–10.5)
Chloride: 100 mEq/L (ref 96–112)
Creat: 0.98 mg/dL (ref 0.50–1.10)
Glucose, Bld: 96 mg/dL (ref 70–99)
Potassium: 4.1 mEq/L (ref 3.5–5.3)
Sodium: 139 mEq/L (ref 135–145)
Total Bilirubin: 0.3 mg/dL (ref 0.3–1.2)
Total Protein: 7.4 g/dL (ref 6.0–8.3)

## 2011-10-18 LAB — TSH: TSH: 4.826 u[IU]/mL — ABNORMAL HIGH (ref 0.350–4.500)

## 2011-10-20 ENCOUNTER — Telehealth: Payer: Self-pay

## 2011-10-20 LAB — KOH PREP: RESULT - KOH: NONE SEEN

## 2011-10-20 NOTE — Telephone Encounter (Signed)
Pt calling about lab results and was told to call about them please contact pt with resulsts.

## 2011-10-21 LAB — T4, FREE: Free T4: 1.05 ng/dL (ref 0.80–1.80)

## 2011-10-21 LAB — T3, FREE: T3, Free: 3.1 pg/mL (ref 2.3–4.2)

## 2011-10-21 NOTE — Telephone Encounter (Signed)
CMET is normal. Need to wait for the rest of the labs to make determination on treatment.

## 2011-10-21 NOTE — Telephone Encounter (Signed)
Patient notified about normal CMET.

## 2011-10-21 NOTE — Telephone Encounter (Signed)
LMOM to CB. 

## 2011-10-21 NOTE — Telephone Encounter (Signed)
Labs still in process. TSH is high so need to add free T3 and free T4 for further evaluation.

## 2011-10-21 NOTE — Telephone Encounter (Signed)
Added Free t3 and free t4 spoke with [facility].

## 2011-10-21 NOTE — Telephone Encounter (Signed)
Everything but the nail cx done, can we advise on those?  Will have Ginger add tests on thyroid.

## 2011-10-21 NOTE — Telephone Encounter (Signed)
Don't see where labs have been reviewed... To PA to advise.

## 2011-10-31 LAB — CULTURE, FUNGUS WITHOUT SMEAR

## 2011-11-07 ENCOUNTER — Other Ambulatory Visit: Payer: Self-pay | Admitting: *Deleted

## 2011-11-07 MED ORDER — CLONAZEPAM 1 MG PO TABS: 1.0000 mg | ORAL_TABLET | Freq: Every evening | ORAL | Status: DC | PRN

## 2011-11-07 NOTE — Telephone Encounter (Signed)
REFILL CLONAZEPAM PRINTED TO BE SIGNED

## 2011-12-04 ENCOUNTER — Telehealth: Payer: Self-pay | Admitting: Internal Medicine

## 2011-12-04 NOTE — Telephone Encounter (Signed)
Caller: Daci/Patient; Patient Name: [name]; PCP: Illene Regulus (Adults only); Best Callback Phone Number: [phone] Asani is calling to request that all labs be sent to Dr. Verdis Prime at [facility]. # [id number] Attn:Suzanne.  She has appnt on 12/05/11 and they would like a copy of her labwork/records in order to compare Lipids and other tests that have been done. She had Mamogram done and it showed possible Aortic Valve Calcification. PCP Calls- no triage.

## 2011-12-05 NOTE — Telephone Encounter (Signed)
Labs printed and faxed per pt request. Pt advised of same via VM.

## 2011-12-06 ENCOUNTER — Telehealth: Payer: Self-pay | Admitting: Internal Medicine

## 2011-12-06 DIAGNOSIS — R0789 Other chest pain: Secondary | ICD-10-CM

## 2011-12-06 NOTE — Telephone Encounter (Signed)
Pt called back.  Very upset full cholestrol panel was not done.  Dr Katrinka Blazing told her it should have been done.  She aslo wants a full panel and calcium.  She wants to do it in the morning. She states Dr Katrinka Blazing can't treat her without these labs.

## 2011-12-06 NOTE — Telephone Encounter (Signed)
Pt is requesting a complete blood work-up because her cardiologist, Dr Verdis Prime,  needs the results due to a recent finding on x-ray of Calcific Arortic Atheroscierosis. Per pt the labs she had done at her physical in June do not show her thyroid,cholesterol, or DM levels and the doctor needs these. She wants to come in tomorrow morning to have the complete panel drawn again, can you please put in an order  Thanks

## 2011-12-06 NOTE — Telephone Encounter (Signed)
Lab October 17, 2011: normal thyroid function with TSH 4.8, FT4 1.05 - would not repeat; Serum glucose was 96 - normal and similar to previous readings - would not repeat.  Last lipid: Jul 18, 2010 - normal with cholestrol 178, HDL 58, LDL 104. Previous lipid panel also normal. On no medication for cholesterol.   These results may be sent to Dr. Verdis Prime - should suffice. If not  And he requires repeat labs will be happy to order them

## 2011-12-06 NOTE — Telephone Encounter (Signed)
Left a message on her phone that there is no medical justification to repeat her lab that was done Oct 17, 2011. Although not indicated will order a lipid panel for AM

## 2011-12-07 ENCOUNTER — Other Ambulatory Visit (INDEPENDENT_AMBULATORY_CARE_PROVIDER_SITE_OTHER): Payer: BC Managed Care – PPO

## 2011-12-07 ENCOUNTER — Other Ambulatory Visit: Payer: Self-pay | Admitting: Internal Medicine

## 2011-12-07 DIAGNOSIS — I7090 Unspecified atherosclerosis: Secondary | ICD-10-CM

## 2011-12-07 DIAGNOSIS — Z0000 Encounter for general adult medical examination without abnormal findings: Secondary | ICD-10-CM

## 2011-12-07 DIAGNOSIS — R0789 Other chest pain: Secondary | ICD-10-CM

## 2011-12-07 LAB — LIPID PANEL
Cholesterol: 189 mg/dL (ref 0–200)
HDL: 49.3 mg/dL (ref >39.00)
LDL Cholesterol: 114 mg/dL — ABNORMAL HIGH (ref 0–99)
Total CHOL/HDL Ratio: 4
Triglycerides: 127 mg/dL (ref 0.0–149.0)
VLDL: 25.4 mg/dL (ref 0.0–40.0)

## 2011-12-07 LAB — HEPATIC FUNCTION PANEL
ALT: 30 U/L (ref 0–35)
AST: 33 U/L (ref 0–37)
Albumin: 4.1 g/dL (ref 3.5–5.2)
Alkaline Phosphatase: 91 U/L (ref 39–117)
Bilirubin, Direct: 0 mg/dL (ref 0.0–0.3)
Total Bilirubin: 0.6 mg/dL (ref 0.3–1.2)
Total Protein: 7.7 g/dL (ref 6.0–8.3)

## 2011-12-07 LAB — HEMOGLOBIN A1C: Hgb A1c MFr Bld: 5.6 % (ref 4.6–6.5)

## 2011-12-08 ENCOUNTER — Encounter: Payer: Self-pay | Admitting: Internal Medicine

## 2011-12-11 ENCOUNTER — Encounter: Payer: Self-pay | Admitting: Internal Medicine

## 2011-12-21 ENCOUNTER — Telehealth: Payer: Self-pay | Admitting: Internal Medicine

## 2011-12-21 NOTE — Telephone Encounter (Signed)
Caller: [name]/Patient; Phone: [phone]; Reason for Call: Patient calling about lab work she had done about 2 weeks ago.  States the Cardiologist told her they are not able to use that due to her not fasting.  She needs to have lab work done again, feels she should not have to pay for it because she was not told to fast.  Please call her back.  Thanks

## 2011-12-25 NOTE — Telephone Encounter (Signed)
Note sent to Dr. Katrinka Blazing to see if he really needs additional information.

## 2011-12-26 NOTE — Telephone Encounter (Signed)
Called patient: explained that in correspondence with Dr. Katrinka [name] he felt that her LDL should be less than 80 and that medication was needed. No need to repeat her lipid panel at this time.  She is concerned about calcification of vessels. She is taking 1500 mg calcium daily plus a calcium rich diet. She is advised to take a total of 1200 mg of calcium daily: diet and supplement if needed.

## 2012-01-29 ENCOUNTER — Telehealth: Payer: Self-pay | Admitting: *Deleted

## 2012-01-30 ENCOUNTER — Telehealth: Payer: Self-pay | Admitting: *Deleted

## 2012-01-30 MED ORDER — LOVASTATIN 10 MG PO TABS: 10.0000 mg | ORAL_TABLET | Freq: Every day | ORAL | Status: DC

## 2012-01-30 NOTE — Telephone Encounter (Signed)
Will start lovastatin 10 mg once a day- sent in RX.

## 2012-01-30 NOTE — Telephone Encounter (Signed)
Opened encounter in error  

## 2012-01-30 NOTE — Telephone Encounter (Signed)
Called pt to let her know that she has to try and fail other statin formulary products first. Pt is ok with that, as long as it can remain at a low mg (2.5 or 5 mg). Go ahead and send to pharmacy.

## 2012-01-30 NOTE — Addendum Note (Signed)
Addended by: Illene Regulus E on: 01/30/2012 12:49 PM   Modules accepted: Orders

## 2012-01-31 ENCOUNTER — Other Ambulatory Visit: Payer: Self-pay | Admitting: *Deleted

## 2012-01-31 NOTE — Telephone Encounter (Signed)
Called pt and lvm an rx for lovastatin 10 mg taken once a day has been sent to pharmacy.

## 2012-01-31 NOTE — Telephone Encounter (Signed)
Called pt to let her know an rx for lovastatin 10 mg taken

## 2012-05-02 ENCOUNTER — Other Ambulatory Visit: Payer: Self-pay | Admitting: *Deleted

## 2012-05-02 MED ORDER — CLONAZEPAM 1 MG PO TABS: 1.0000 mg | ORAL_TABLET | Freq: Every evening | ORAL | Status: DC | PRN

## 2012-05-09 ENCOUNTER — Ambulatory Visit (INDEPENDENT_AMBULATORY_CARE_PROVIDER_SITE_OTHER): Payer: BC Managed Care – PPO | Admitting: Family Medicine

## 2012-05-09 VITALS — BP 144/83 | HR 88 | Temp 98.3°F | Resp 18 | Ht 64.75 in | Wt 192.0 lb

## 2012-05-09 DIAGNOSIS — J322 Chronic ethmoidal sinusitis: Secondary | ICD-10-CM

## 2012-05-09 DIAGNOSIS — I7300 Raynaud's syndrome without gangrene: Secondary | ICD-10-CM

## 2012-05-09 MED ORDER — MUCINEX DM 30-600 MG PO TB12: 1.0000 | ORAL_TABLET | Freq: Two times a day (BID) | ORAL | Status: DC | PRN

## 2012-05-09 MED ORDER — AMOXICILLIN-POT CLAVULANATE 875-125 MG PO TABS: 1.0000 | ORAL_TABLET | Freq: Two times a day (BID) | ORAL | Status: DC

## 2012-05-09 NOTE — Patient Instructions (Addendum)
Ethmoid sinusitis  Paroxysmal digital cyanosis - Plan: Ambulatory referral to Rheumatology

## 2012-05-09 NOTE — Progress Notes (Signed)
[address]   [place], [place]  [address]   [phone]  Subjective:    Patient ID: [name], female    DOB: 07/29/1950, 62 y.o.   MRN: [id number]  HPI This 62 y.o. female presents for evaluation of sinus congestion and cold.  Onset three weeks ago.  Cannot improve.  No fever but +chills more in the evening or early morning.  +HA; +sinus pressure.  +ST from PND; no ear pain; +PND; +cough; +rhinorrhea clear; +nasal congestion.  R side of face tender and pressure; nasal congestion on R mostly.  No SOB except for yesterday; also felt weak and achy yesterday.  Taking Mucinex blue box for cough bid.    2. Cyanosis feet and arms:  Onset one year ago after back surgery.  Has looked on internet and suggests Raynauds.  Has discussed with Dr. Debby Bud but no work up suggested.  No pain with blueness.  Hands and feet do feel swollen.  Ever since back surgery, must get bigger shoe due to worsening swelling.  No chest pain but chronic palpitations.  Found calcifications in aorta; s/p consultation by Dr. Katrinka Blazing and second opinion by Dr. Lenard Forth; follow-up with cardiology for LFTs, follow-up hypercholesterolemia.  Have discussed blue toe and fingers with cardiologist both; etiology not stated; no referral recommended; pt very concerned regarding etiology.   Review of Systems  Constitutional: Positive for chills and fatigue. Negative for fever and diaphoresis.  HENT: Positive for congestion, sore throat, rhinorrhea, postnasal drip and sinus pressure. Negative for ear pain.   Respiratory: Positive for cough. Negative for shortness of breath, wheezing and stridor.   Cardiovascular: Positive for palpitations and leg swelling. Negative for chest pain.  Gastrointestinal: Negative for nausea, vomiting and diarrhea.  Skin: Positive for color change. Negative for rash and wound.        Past Medical History  Diagnosis Date  . Hypertension   . Migraine headache     hormonal related - less frequent off  hormone replacement  . Synovial cyst of lumbar facet joint     lower back - s/p surgery   . Derangement of knee, left 2009    started with fall at work. Has had repair patella and torn meniscus  . Migraines   . Hx of appendectomy     Past Surgical History  Procedure Laterality Date  . Ovarian cyst surgery  1972  . Appendectomy  1971  . Cervical discectomy  1999    diskectomy with fixation:plate and screws  . Synovial cyst excision  2007    lumbar spine  . Patellar reefing  '09    repair of fractured patella after fall  . Knee arthroscopy w/ meniscal repair  09    left knee  . Venous ablation      for pain in the groin - [facility] did procedure. Has some residual discomfort at the  ablation site.   . L4-5 lumbar fusion  September 02, 2010    Dr. Gerlene Fee:  minimally invasive transforaminal interbody fusion with spacer    Prior to Admission medications   Medication Sig Start Date End Date Taking? Authorizing Provider  amitriptyline (ELAVIL) 10 MG tablet Take 1 tablet (10 mg total) by mouth 2 (two) times daily. 08/28/11  Yes Jacques Navy, MD  B Complex Vitamins (VITAMIN B COMPLEX PO) Take by mouth.     Yes Historical Provider, MD  Calcium 1500 MG tablet Take 1,500 mg by mouth daily.    Yes Historical  Provider, MD  Cholecalciferol (VITAMIN D) 1000 UNITS capsule Take 1,000 Units by mouth daily.     Yes Historical Provider, MD  clonazePAM (KLONOPIN) 1 MG tablet Take 1 tablet (1 mg total) by mouth at bedtime as needed. For anxiety 05/02/12  Yes Jacques Navy, MD  gabapentin (NEURONTIN) 300 MG capsule Take 2 capsules (600 mg total) by mouth QID. For pain; in addition to scheduled doses if pain is bad 01/20/11  Yes Jacques Navy, MD  glucosamine-chondroitin 500-400 MG tablet Take 1 tablet by mouth daily.    Yes Historical Provider, MD  hydrochlorothiazide (HYDRODIURIL) 25 MG tablet Take 1 tablet (25 mg total) by mouth daily. 08/16/11  Yes Jacques Navy, MD  lovastatin (MEVACOR) 10 MG tablet  Take 1 tablet (10 mg total) by mouth at bedtime. 01/30/12  Yes Jacques Navy, MD  amoxicillin-clavulanate (AUGMENTIN) 875-125 MG per tablet Take 1 tablet by mouth 2 (two) times daily. 05/09/12   Ethelda Chick, MD  cyclobenzaprine (FLEXERIL) 10 MG tablet Take 10 mg by mouth 3 (three) times daily as needed.      Historical Provider, MD  Dextromethorphan-Guaifenesin (MUCINEX DM) 30-600 MG TB12 Take 1 tablet by mouth 2 (two) times daily as needed. 05/09/12   Ethelda Chick, MD  esomeprazole (NEXIUM) 40 MG capsule Take 1 capsule (40 mg total) by mouth daily before breakfast. 08/21/11   Jacques Navy, MD  hydrochlorothiazide (HYDRODIURIL) 25 MG tablet Take 1 tablet (25 mg total) by mouth daily. 01/20/11   Jacques Navy, MD    Allergies  Allergen Reactions  . Caine-1 (Lidocaine Hcl) Palpitations  . Celebrex (Celecoxib) Palpitations  . Codeine Palpitations  . Epinephrine Palpitations    NASAL SPRAY  . Other Palpitations    Glucocorticoids/Steroids  . Procaine Hcl (Procaine Hcl) Palpitations  . Sulfa Antibiotics Palpitations    History   Social History  . Marital Status: Widowed    Spouse Name: N/A    Number of Children: N/A  . Years of Education: 14   Occupational History  . 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    Social History Main Topics  . Smoking status: Never Smoker   . Smokeless tobacco: Not on file  . Alcohol Use: No  . Drug Use: Not on file  . Sexually Active: Yes -- Female partner(s)   Other Topics Concern  . 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 1 dtr- '72; 1 son '73; 4 grandchildren, 2 step-grands. 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     Family History  Problem Relation Age of Onset  . Diabetes Mother   . COPD Mother   . Aortic aneurysm Mother   . Hypertension Mother   . Cancer Father     lung  . Hyperlipidemia  Father   . Hypertension Father   . Heart disease Father   . Diabetes Father   . Diabetes Brother   . Hypothyroidism Mother     Objective:   Physical Exam  Nursing note and vitals reviewed. Constitutional: She appears well-developed and well-nourished. No distress.  HENT:  Head: Normocephalic and atraumatic.  Right Ear: External ear normal.  Left Ear: External ear normal.  Nose: Mucosal edema and rhinorrhea present. Right sinus exhibits maxillary sinus tenderness. Right sinus exhibits no frontal sinus tenderness. Left sinus exhibits no maxillary sinus tenderness and no frontal sinus tenderness.  Mouth/Throat: Oropharynx is clear and moist.  Eyes: Conjunctivae and EOM are normal. Pupils are equal, round, and reactive to light.  Neck: Normal range of motion. Neck supple.  Cardiovascular: Normal rate, regular rhythm, normal heart sounds and intact distal pulses.  Exam reveals no gallop and no friction rub.   No murmur heard. Pulses:      Radial pulses are 2+ on the right side, and 2+ on the left side.       Dorsalis pedis pulses are 2+ on the right side, and 2+ on the left side.  Cyanosis fingers B hands and toes B toes.  Pulmonary/Chest: Effort normal and breath sounds normal. She has no wheezes. She has no rales.  Lymphadenopathy:    She has no cervical adenopathy.  Skin: Skin is warm and dry. No rash noted. She is not diaphoretic.  Psychiatric: She has a normal mood and affect. Her behavior is normal.       Assessment & Plan:  Ethmoid sinusitis  Paroxysmal digital cyanosis - Plan: Ambulatory referral to Rheumatology    1. Acute Sinusitis:  New.  Rx for Augmentin, Mucinex DM.  To start Nasonex daily which she has at home. 2.  Paroxysmal Digital Cyanosis:  New.  Persistent for past year.  Has discussed with cardiology.  Refer for rheumatology to rule out Raynaud's syndrome.  Normal pulses of hands and feet.  Meds ordered this encounter  Medications  .  amoxicillin-clavulanate (AUGMENTIN) 875-125 MG per tablet    Sig: Take 1 tablet by mouth 2 (two) times daily.    Dispense:  20 tablet    Refill:  0  . Dextromethorphan-Guaifenesin (MUCINEX DM) 30-600 MG TB12    Sig: Take 1 tablet by mouth 2 (two) times daily as needed.    Dispense:  28 each    Refill:  0

## 2012-06-04 ENCOUNTER — Ambulatory Visit (INDEPENDENT_AMBULATORY_CARE_PROVIDER_SITE_OTHER): Payer: BC Managed Care – PPO | Admitting: Internal Medicine

## 2012-06-04 ENCOUNTER — Encounter: Payer: Self-pay | Admitting: Internal Medicine

## 2012-06-04 VITALS — BP 160/100 | HR 95 | Temp 98.1°F | Resp 18 | Ht 64.5 in | Wt 189.0 lb

## 2012-06-04 DIAGNOSIS — R5383 Other fatigue: Secondary | ICD-10-CM

## 2012-06-04 DIAGNOSIS — R5381 Other malaise: Secondary | ICD-10-CM

## 2012-06-04 DIAGNOSIS — R51 Headache: Secondary | ICD-10-CM

## 2012-06-04 DIAGNOSIS — R42 Dizziness and giddiness: Secondary | ICD-10-CM

## 2012-06-04 DIAGNOSIS — R040 Epistaxis: Secondary | ICD-10-CM

## 2012-06-04 DIAGNOSIS — I10 Essential (primary) hypertension: Secondary | ICD-10-CM

## 2012-06-04 LAB — POCT CBC
Granulocyte percent: 61.3 %G (ref 37–80)
HCT, POC: 46.7 % (ref 37.7–47.9)
Hemoglobin: 14.9 g/dL (ref 12.2–16.2)
Lymph, poc: 2.1 (ref 0.6–3.4)
MCH, POC: 28.9 pg (ref 27–31.2)
MCHC: 31.9 g/dL (ref 31.8–35.4)
MCV: 90.6 fL (ref 80–97)
MID (cbc): 0.5 (ref 0–0.9)
MPV: 7.7 fL (ref 0–99.8)
POC Granulocyte: 4.1 (ref 2–6.9)
POC LYMPH PERCENT: 31.2 %L (ref 10–50)
POC MID %: 7.5 %M (ref 0–12)
Platelet Count, POC: 337 10*3/uL (ref 142–424)
RBC: 5.16 M/uL (ref 4.04–5.48)
RDW, POC: 14.4 %
WBC: 6.7 10*3/uL (ref 4.6–10.2)

## 2012-06-04 LAB — BASIC METABOLIC PANEL
BUN: 12 mg/dL (ref 6–23)
CO2: 33 mEq/L — ABNORMAL HIGH (ref 19–32)
Calcium: 10.4 mg/dL (ref 8.4–10.5)
Chloride: 101 mEq/L (ref 96–112)
Creat: 1.1 mg/dL (ref 0.50–1.10)
Glucose, Bld: 98 mg/dL (ref 70–99)
Potassium: 5 mEq/L (ref 3.5–5.3)
Sodium: 142 mEq/L (ref 135–145)

## 2012-06-04 NOTE — Progress Notes (Signed)
  Subjective:    Patient ID: [name], female    DOB: [birth year], 62 y.o.   MRN: [id number]  HPI Woke with blood on sheets and pillow case.Has felt weak and lite headed all day, feels unsteady. Had full neuro eval last week and her cardiologist started her on a statin last week. Her primary care has her HCTZ for years. Is not on K replacement. She has a picture of blood on pillow/sheet. She feels like she is having medication side affect Neuro eval moth numbness and is working with her.  Review of Systems Chronic pain fron multiple spine surgerys.    Objective:   Physical Exam  Vitals reviewed. Constitutional: She is oriented to person, place, and time. She appears well-developed and well-nourished. She appears distressed.  HENT:  Nose: Nose normal.  Eyes: EOM are normal. Pupils are equal, round, and reactive to light. No scleral icterus.  Neck: Normal range of motion. Neck supple.  Cardiovascular: Normal rate, regular rhythm, normal heart sounds and intact distal pulses.   Pulmonary/Chest: Effort normal and breath sounds normal.  Musculoskeletal: Normal range of motion.  Neurological: She is alert and oriented to person, place, and time. No cranial nerve deficit. She exhibits normal muscle tone. Coordination normal.  Psychiatric: Her speech is normal. Judgment and thought content normal. Her mood appears anxious. She is slowed. Cognition and memory are normal.  Anxious and fatigued appearing/ gate a bit unsteady 156/91  138/80lying  149/83 sitting  Cbc/bmet Results for orders placed in visit on 06/04/12  POCT CBC      Result Value Range   WBC 6.7  4.6 - 10.2 K/uL   Lymph, poc 2.1  0.6 - 3.4   POC LYMPH PERCENT 31.2  10 - 50 %L   MID (cbc) 0.5  0 - 0.9   POC MID % 7.5  0 - 12 %M   POC Granulocyte 4.1  2 - 6.9   Granulocyte percent 61.3  37 - 80 %G   RBC 5.16  4.04 - 5.48 M/uL   Hemoglobin 14.9  12.2 - 16.2 g/dL   HCT, POC 40.9  81.1 - 47.9 %   MCV 90.6  80 -  97 fL   MCH, POC 28.9  27 - 31.2 pg   MCHC 31.9  31.8 - 35.4 g/dL   RDW, POC 91.4     Platelet Count, POC 337  142 - 424 K/uL   MPV 7.7  0 - 99.8 fL   rexam stable after observed 1 hr    Assessment & Plan:  Stop statin Take K/diet

## 2012-06-04 NOTE — Patient Instructions (Addendum)
Lovastatin tablets What is this medicine? LOVASTATIN (LOE va sta tin) is known as a HMG-CoA reductase inhibitor or 'statin'. It lowers the level of cholesterol in the blood. This drug may also reduce the risk of heart attack or other health problems in patients with risk factors for heart disease. Diet and lifestyle changes are often used with this drug. This medicine may be used for other purposes; ask your health care provider or pharmacist if you have questions. What should I tell my health care provider before I take this medicine? They need to know if you have any of these conditions: -frequently drink alcoholic beverages -kidney disease -liver disease -muscle aches or weakness -other medical condition -an unusual or allergic reaction to lovastatin, other medicines, foods, dyes, or preservatives -pregnant or trying to get pregnant -breast-feeding How should I use this medicine? Take this medicine by mouth with a glass of water. Follow the directions on the prescription label. Take with food. Take your doses at regular intervals. Do not take your medicine more often than directed. Talk to your pediatrician regarding the use of this medicine in children. While this drug may be prescribed for children as young as 9 years of age for selected conditions, precautions do apply. Overdosage: If you think you have taken too much of this medicine contact a poison control center or emergency room at once. NOTE: This medicine is only for you. Do not share this medicine with others. What if I miss a dose? If you miss a dose, take it as soon as you can. If it is almost time for your next dose, take only that dose. Do not take double or extra doses. What may interact with this medicine? Do not take this medicine with any of the following medications: -boceprevir -certain antibiotics like erythromycin, clarithromycin, and telithromycin -certain antiviral medicines for HIV or AIDS -gemfibrozil -herbal  medicines like red yeast rice -medicines for fungal infections like itraconazole, ketoconazole, and posaconazole -mifepristone, RU-486 -nefazodone -telaprevir This medicine may also interact with the following medications: -alcohol -amiodarone -colchicine -cyclosporine -danazol -diltiazem -fluconazole -grapefruit juice -other medicines for high cholesterol -ranolazine -verapamil -voriconazole -warfarin This list may not describe all possible interactions. Give your health care provider a list of all the medicines, herbs, non-prescription drugs, or dietary supplements you use. Also tell them if you smoke, drink alcohol, or use illegal drugs. Some items may interact with your medicine. What should I watch for while using this medicine? Visit your doctor or health care professional for regular check-ups. You may need regular tests to make sure your liver is working properly. Tell you doctor or health care professional right away if you get any unexplained muscle pain, tenderness, or weakness, especially if you also have a fever and tiredness. This drug is only part of a total heart-health program. Your doctor or a dietician can suggest a low-cholesterol and low-fat diet to help. Avoid alcohol and smoking, and keep a proper exercise schedule. Do not use this drug if you are pregnant or breast-feeding. Serious side effects to an unborn child or to an infant are possible. Talk to your doctor or pharmacist for more information. This medicine may affect blood sugar levels. If you have diabetes, check with your doctor or health care professional before you change your diet or the dose of your diabetic medicine. If you are going to have surgery tell your health care professional that you are taking this drug. Some drugs may increase the risk of side effects from lovastatin.  If you are given certain antibiotics or antifungals, your doctor or health care professional may stop lovastatin for a short  time. Check with your doctor or pharmacist for advice. What side effects may I notice from receiving this medicine? Side effects that you should report to your doctor or health care professional as soon as possible: -allergic reactions like skin rash, itching or hives, swelling of the face, lips, or tongue -dark urine -fever -joint pain -muscle cramps, pain -redness, blistering, peeling or loosening of the skin, including inside the mouth -trouble passing urine or change in the amount of urine -unusually weak or tired -yellowing of the eyes or skin Side effects that usually do not require medical attention (report to your doctor or health care professional if they continue or are bothersome): -constipation -gas -heartburn -indigestion -stomach pain This list may not describe all possible side effects. Call your doctor for medical advice about side effects. You may report side effects to FDA at 1-800-FDA-1088. Where should I keep my medicine? Keep out of the reach of children. Store at room temperature between 5 and 30 degrees C (41 and 86 degrees F). Protect from light. Keep container tightly closed. Throw away any unused medicine after the expiration date. NOTE: This sheet is a summary. It may not cover all possible information. If you have questions about this medicine, talk to your doctor, pharmacist, or health care provider.  2012, [employer]vier/[employer]. (07/20/2010 1:15:27 PM)

## 2012-06-06 ENCOUNTER — Telehealth: Payer: Self-pay | Admitting: Radiology

## 2012-06-06 NOTE — Telephone Encounter (Signed)
Spoke to Dr Perrin Maltese. He thinks her meds may be contributing to her dizziness. She needs to see her PCP this week. If she does not want to see her PCP she should see her neurologist for the dizziness. I have called her to advise.

## 2012-06-06 NOTE — Telephone Encounter (Signed)
Patient advised she is still dizzy. Advised her labs normal please advise next step in plan of care.

## 2012-06-11 ENCOUNTER — Encounter (HOSPITAL_COMMUNITY): Payer: Self-pay

## 2012-06-11 ENCOUNTER — Emergency Department (HOSPITAL_COMMUNITY): Payer: BC Managed Care – PPO

## 2012-06-11 ENCOUNTER — Emergency Department (HOSPITAL_COMMUNITY)
Admission: EM | Admit: 2012-06-11 | Discharge: 2012-06-12 | Disposition: A | Discharge status: Home / Self Care | Payer: BC Managed Care – PPO | Attending: Emergency Medicine | Admitting: Emergency Medicine

## 2012-06-11 DIAGNOSIS — R531 Weakness: Secondary | ICD-10-CM

## 2012-06-11 DIAGNOSIS — R079 Chest pain, unspecified: Secondary | ICD-10-CM

## 2012-06-11 DIAGNOSIS — R0602 Shortness of breath: Secondary | ICD-10-CM | POA: Insufficient documentation

## 2012-06-11 DIAGNOSIS — R42 Dizziness and giddiness: Secondary | ICD-10-CM | POA: Insufficient documentation

## 2012-06-11 DIAGNOSIS — R5381 Other malaise: Secondary | ICD-10-CM | POA: Insufficient documentation

## 2012-06-11 DIAGNOSIS — Z7982 Long term (current) use of aspirin: Secondary | ICD-10-CM | POA: Insufficient documentation

## 2012-06-11 DIAGNOSIS — Z8739 Personal history of other diseases of the musculoskeletal system and connective tissue: Secondary | ICD-10-CM | POA: Insufficient documentation

## 2012-06-11 DIAGNOSIS — Z8639 Personal history of other endocrine, nutritional and metabolic disease: Secondary | ICD-10-CM | POA: Insufficient documentation

## 2012-06-11 DIAGNOSIS — R0789 Other chest pain: Secondary | ICD-10-CM | POA: Insufficient documentation

## 2012-06-11 DIAGNOSIS — Z862 Personal history of diseases of the blood and blood-forming organs and certain disorders involving the immune mechanism: Secondary | ICD-10-CM | POA: Insufficient documentation

## 2012-06-11 DIAGNOSIS — M549 Dorsalgia, unspecified: Secondary | ICD-10-CM | POA: Insufficient documentation

## 2012-06-11 DIAGNOSIS — Z79899 Other long term (current) drug therapy: Secondary | ICD-10-CM | POA: Insufficient documentation

## 2012-06-11 DIAGNOSIS — IMO0001 Reserved for inherently not codable concepts without codable children: Secondary | ICD-10-CM | POA: Insufficient documentation

## 2012-06-11 DIAGNOSIS — Z9181 History of falling: Secondary | ICD-10-CM | POA: Insufficient documentation

## 2012-06-11 DIAGNOSIS — Z8719 Personal history of other diseases of the digestive system: Secondary | ICD-10-CM | POA: Insufficient documentation

## 2012-06-11 DIAGNOSIS — R110 Nausea: Secondary | ICD-10-CM | POA: Insufficient documentation

## 2012-06-11 DIAGNOSIS — G43909 Migraine, unspecified, not intractable, without status migrainosus: Secondary | ICD-10-CM | POA: Insufficient documentation

## 2012-06-11 DIAGNOSIS — I10 Essential (primary) hypertension: Secondary | ICD-10-CM | POA: Insufficient documentation

## 2012-06-11 HISTORY — DX: Pure hypercholesterolemia, unspecified: E78.00

## 2012-06-11 LAB — HEPATIC FUNCTION PANEL
ALT: 23 U/L (ref 0–35)
AST: 24 U/L (ref 0–37)
Albumin: 3.9 g/dL (ref 3.5–5.2)
Alkaline Phosphatase: 84 U/L (ref 39–117)
Bilirubin, Direct: <0.1 mg/dL (ref 0.0–0.3)
Total Bilirubin: 0.3 mg/dL (ref 0.3–1.2)
Total Protein: 7.1 g/dL (ref 6.0–8.3)

## 2012-06-11 LAB — BASIC METABOLIC PANEL
BUN: 16 mg/dL (ref 6–23)
CO2: 29 mEq/L (ref 19–32)
Calcium: 10.1 mg/dL (ref 8.4–10.5)
Chloride: 99 mEq/L (ref 96–112)
Creatinine, Ser: 0.84 mg/dL (ref 0.50–1.10)
GFR calc Af Amer: 85 mL/min — ABNORMAL LOW (ref >90)
GFR calc non Af Amer: 74 mL/min — ABNORMAL LOW (ref >90)
Glucose, Bld: 111 mg/dL — ABNORMAL HIGH (ref 70–99)
Potassium: 3.7 mEq/L (ref 3.5–5.1)
Sodium: 140 mEq/L (ref 135–145)

## 2012-06-11 LAB — POCT I-STAT TROPONIN I: Troponin i, poc: 0.01 ng/mL (ref 0.00–0.08)

## 2012-06-11 LAB — CBC
HCT: 43.2 % (ref 36.0–46.0)
Hemoglobin: 15.2 g/dL — ABNORMAL HIGH (ref 12.0–15.0)
MCH: 29.9 pg (ref 26.0–34.0)
MCHC: 35.2 g/dL (ref 30.0–36.0)
MCV: 85 fL (ref 78.0–100.0)
Platelets: 278 10*3/uL (ref 150–400)
RBC: 5.08 MIL/uL (ref 3.87–5.11)
RDW: 13.8 % (ref 11.5–15.5)
WBC: 7.5 10*3/uL (ref 4.0–10.5)

## 2012-06-11 LAB — D-DIMER, QUANTITATIVE: D-Dimer, Quant: <0.27 ug/mL-FEU (ref 0.00–0.48)

## 2012-06-11 LAB — PRO B NATRIURETIC PEPTIDE: Pro B Natriuretic peptide (BNP): 31.3 pg/mL (ref 0–125)

## 2012-06-11 NOTE — ED Provider Notes (Signed)
History     CSN: [id number]  Arrival date & time 06/11/12  1911   First MD Initiated Contact with Patient 06/11/12 2129     Chief Complaint  Patient presents with  . Chest Pain   HPI  62 y/o female with history of HTN, HLD, Back surgery who presents with cc of chest pain, fatigue, myalgias, and generalized weakness. The patient states that she has recently been having muscle aches and has since stopped taking her statin. She states that her symptoms began yesterday while working in the yard. She states that while doing yard work she developed generalized weakness, shortness of breath, and mid sternal chest pressure. She states she laid down and her symptoms resolved. She states that overnight her symptoms improved. Today she states the chest pressure returned at approximately 1400. She states her chest pressure and "heaviness" has been constant since that time. She states that her pain was a 9/10 when at its worst. She states the pain radiated to her back.   Past Medical History  Diagnosis Date  . Hypertension   . Migraine headache     hormonal related - less frequent off hormone replacement  . Synovial cyst of lumbar facet joint     lower back - s/p surgery   . Derangement of knee, left 2009    started with fall at work. Has had repair patella and torn meniscus  . Migraines   . Hx of appendectomy   . High cholesterol     Past Surgical History  Procedure Laterality Date  . Ovarian cyst surgery  1972  . Appendectomy  1971  . Cervical discectomy  1999    diskectomy with fixation:plate and screws  . Synovial cyst excision  2007    lumbar spine  . Patellar reefing  '09    repair of fractured patella after fall  . Knee arthroscopy w/ meniscal repair  09    left knee  . Venous ablation      for pain in the groin - [facility] did procedure. Has some residual discomfort at the  ablation site.   . L4-5 lumbar fusion  September 02, 2010    Dr. Gerlene Fee:  minimally invasive transforaminal  interbody fusion with spacer    Family History  Problem Relation Age of Onset  . Diabetes Mother   . COPD Mother   . Aortic aneurysm Mother   . Hypertension Mother   . Cancer Father     lung  . Hyperlipidemia Father   . Hypertension Father   . Heart disease Father   . Diabetes Father   . Diabetes Brother   . Hypothyroidism Mother     History  Substance Use Topics  . Smoking status: Never Smoker   . Smokeless tobacco: Never Used  . Alcohol Use: No    OB History   Grav Para Term Preterm Abortions TAB SAB Ect Mult Living                 Review of Systems  Constitutional: Positive for fatigue. Negative for fever and chills.  Respiratory: Positive for shortness of breath.   Cardiovascular: Positive for chest pain.  Gastrointestinal: Positive for nausea. Negative for vomiting and abdominal pain.  Musculoskeletal: Positive for back pain.  Neurological: Positive for weakness and light-headedness.  All other systems reviewed and are negative.   Allergies  Caine-1; Celebrex; Codeine; Epinephrine; Other; Procaine hcl; and Sulfa antibiotics  Home Medications   Current Outpatient Rx  Name  Route  Sig  Dispense  Refill  . amitriptyline (ELAVIL) 10 MG tablet   Oral   Take 20 mg by mouth at bedtime.         . Ascorbic Acid (VITAMIN C) 500 MG CAPS   Oral   Take 500 mg by mouth every evening.         Marland Kitchen aspirin EC 81 MG tablet   Oral   Take 81 mg by mouth daily.         . B Complex Vitamins (VITAMIN B COMPLEX PO)   Oral   Take 1 tablet by mouth daily.          . Beclomethasone Diprop Monohyd (VANCENASE AQ DOUBLE STRENGTH NA)   Nasal   Place 1 spray into the nose 2 (two) times daily as needed (for allergies).         . CALCIUM PO   Oral   Take 500 mg by mouth every evening.         . clonazePAM (KLONOPIN) 1 MG tablet   Oral   Take 1 mg by mouth at bedtime.         . cyclobenzaprine (FLEXERIL) 10 MG tablet   Oral   Take 10 mg by mouth 2 (two)  times daily as needed for muscle spasms.          Marland Kitchen esomeprazole (NEXIUM) 40 MG capsule   Oral   Take 40 mg by mouth daily as needed (for acid reflux).         . famotidine (PEPCID) 20 MG tablet   Oral   Take 20 mg by mouth daily.         . hydrochlorothiazide (HYDRODIURIL) 25 MG tablet   Oral   Take 1 tablet (25 mg total) by mouth daily.   30 tablet   11   . magnesium gluconate (MAGONATE) 500 MG tablet   Oral   Take 500 mg by mouth every other day.         . Multiple Vitamin (MULTIVITAMIN WITH MINERALS) TABS   Oral   Take 1 tablet by mouth daily.         . naproxen sodium (ANAPROX) 220 MG tablet   Oral   Take 440 mg by mouth 2 (two) times daily as needed (pain).         . potassium gluconate (TH POTASSIUM GLUCONATE) 595 MG TABS   Oral   Take 595 mg by mouth every other day.         Marland Kitchen PRESCRIPTION MEDICATION   Oral   Take 1 tablet by mouth 2 (two) times daily as needed (pain). Hydrocodone product from [facility]         . vitamin E 400 UNIT capsule   Oral   Take 400 Units by mouth every evening.           BP 135/74  Pulse 95  Temp(Src) 98 F (36.7 C) (Oral)  Resp 21  SpO2 93%  Physical Exam  Nursing note and vitals reviewed. Constitutional: She is oriented to person, place, and time. She appears well-developed and well-nourished. No distress.  HENT:  Head: Normocephalic and atraumatic.  Mouth/Throat: Oropharynx is clear and moist. No oropharyngeal exudate.  Eyes: Conjunctivae are normal. Pupils are equal, round, and reactive to light.  Neck: Normal range of motion. Neck supple.  Cardiovascular: Normal rate and regular rhythm.  Exam reveals no gallop and no friction rub.   No murmur heard. Pulses:  Radial pulses are 2+ on the right side, and 2+ on the left side.  Pulmonary/Chest: Effort normal and breath sounds normal.  Abdominal: She exhibits no distension. There is no tenderness.  Musculoskeletal: Normal range of motion. She  exhibits no edema and no tenderness.  Neurological: She is alert and oriented to person, place, and time. She has normal strength. No cranial nerve deficit or sensory deficit.  Skin: Skin is warm and dry.  Psychiatric: She has a normal mood and affect.    ED Course  Procedures (including critical care time)  Labs Reviewed  CBC - Abnormal; Notable for the following:    Hemoglobin 15.2 (*)    All other components within normal limits  BASIC METABOLIC PANEL - Abnormal; Notable for the following:    Glucose, Bld 111 (*)    GFR calc non Af Amer 74 (*)    GFR calc Af Amer 85 (*)    All other components within normal limits  PRO B NATRIURETIC PEPTIDE  D-DIMER, QUANTITATIVE  HEPATIC FUNCTION PANEL  POCT I-STAT TROPONIN I  POCT I-STAT TROPONIN I   Dg Chest 2 View  06/11/2012  *RADIOLOGY REPORT*  Clinical Data: Chest pain, shortness of breath  CHEST - 2 VIEW  Comparison: CTA chest dated 01/12/2011  Findings: Mild left basilar scarring or atelectasis.  No focal consolidation.  No pleural effusion or pneumothorax.  The heart is normal in size.  Mild degenerative changes of the visualized thoracolumbar spine. Cervical spine fixation hardware.  IMPRESSION: No evidence of acute cardiopulmonary disease.   Original Report Authenticated By: Charline Bills, M.D.    1. Chest pain   2. Generalized weakness   3. Shortness of breath      Date: 06/11/2012  Rate: 85  Rhythm: normal sinus rhythm  QRS Axis: normal  Intervals: normal  ST/T Wave abnormalities: normal  Conduction Disutrbances:none  Narrative Interpretation:   Old EKG Reviewed: none available  MDM  62 y/o female with history of HTN, HLD, Back surgery who presents with cc of chest pain, fatigue, myalgias, and generalized weakness. HDS. Well appearing. Afebrile. No infectious symptoms. EKG without acute ischemia. Given chest pain has been constant since 1400 and she has had a negative delta troponin feel ACS is unlikely. The patient was  determined to be low risk for PE. D-dimer was negative so feel PE is unlikely. Labs unremarkable. CXR not c/w pneumonia, dissection, or pneumothorax. Unclear etiology of the patient's pain. Though ACS is felt to be unlikely the patient was instructed to follow up with her cardiologist or primary care physician for outpatient stress test. Return precautions given and addressed with the patient who was in agreement with the plan.         Shanon Ace, MD 06/12/12 2223

## 2012-06-11 NOTE — ED Notes (Signed)
Patient resting on stretcher at this time. States she is feeling much better at this time. Rates pain 5/10. Denies any shortness of breath. No acute distress noted, resp are even and unlabored. Plan of care discussed with patient. Family at bedside. Call light on bed. Will continue to monitor.

## 2012-06-11 NOTE — ED Notes (Signed)
Pharmacy at bedside

## 2012-06-11 NOTE — ED Notes (Signed)
Patient resting on stretcher at this time. No acute distress noted, resp are even and unlabored. Informed patient that MD should be coming to see her shortly. Patients family at bedside. Call light at bedside. Will continue to monitor.

## 2012-06-11 NOTE — ED Notes (Signed)
Patient presents with mid-chest pain that radiates to her back. Onset yesterday around 1500 while working in the yard. Also has generalized weakness, shortness of breath and nausea. Described pain as a shooting sensation, rated 8/10. Pain got better around 2030 last night after taking sleeping medications. Weakness remained. Today, around 1400 pain returned. Described as a constant heaviness, rates 9/10. Also with shortness of breath, dizziness and intermittent nausea. Also has left sided sharp head pain. States that if "sort of feels like a sinus headache."

## 2012-06-12 LAB — POCT I-STAT TROPONIN I: Troponin i, poc: 0 ng/mL (ref 0.00–0.08)

## 2012-06-12 NOTE — ED Notes (Signed)
Patient given discharge instructions for non specific chest pain. No rx was given. Advised to follow up with primary care or to return to this department if condition worsens. Patient voiced understanding of all instructions and had no further questions. Patient ambulated to front lobby without difficulty.

## 2012-06-12 NOTE — ED Provider Notes (Signed)
I saw and evaluated the patient, reviewed the resident's note and I agree with the findings and plan.  I have reviewed and agree with the EKG findings as documented by the resident  Pt awake, alert, NAD, CV RRR, lungs CTA, no peripheral edema  Ethelda Chick, MD 06/12/12 2232

## 2012-07-22 ENCOUNTER — Other Ambulatory Visit: Payer: Self-pay | Admitting: Family Medicine

## 2012-07-22 ENCOUNTER — Ambulatory Visit: Payer: BC Managed Care – PPO

## 2012-07-22 ENCOUNTER — Ambulatory Visit (INDEPENDENT_AMBULATORY_CARE_PROVIDER_SITE_OTHER): Payer: BC Managed Care – PPO | Admitting: Family Medicine

## 2012-07-22 VITALS — BP 152/82 | HR 91 | Temp 97.9°F | Resp 18 | Ht 65.0 in | Wt 192.0 lb

## 2012-07-22 DIAGNOSIS — R5383 Other fatigue: Secondary | ICD-10-CM

## 2012-07-22 DIAGNOSIS — R040 Epistaxis: Secondary | ICD-10-CM

## 2012-07-22 DIAGNOSIS — I7300 Raynaud's syndrome without gangrene: Secondary | ICD-10-CM

## 2012-07-22 DIAGNOSIS — R5381 Other malaise: Secondary | ICD-10-CM

## 2012-07-22 DIAGNOSIS — F411 Generalized anxiety disorder: Secondary | ICD-10-CM

## 2012-07-22 DIAGNOSIS — M542 Cervicalgia: Secondary | ICD-10-CM

## 2012-07-22 LAB — POCT CBC
Granulocyte percent: 70.2 %G (ref 37–80)
HCT, POC: 45.3 % (ref 37.7–47.9)
Hemoglobin: 14.4 g/dL (ref 12.2–16.2)
Lymph, poc: 1.9 (ref 0.6–3.4)
MCH, POC: 29.4 pg (ref 27–31.2)
MCHC: 31.8 g/dL (ref 31.8–35.4)
MCV: 92.7 fL (ref 80–97)
MID (cbc): 0.4 (ref 0–0.9)
MPV: 8.1 fL (ref 0–99.8)
POC Granulocyte: 5.3 (ref 2–6.9)
POC LYMPH PERCENT: 24.5 %L (ref 10–50)
POC MID %: 5.3 %M (ref 0–12)
Platelet Count, POC: 292 10*3/uL (ref 142–424)
RBC: 4.89 M/uL (ref 4.04–5.48)
RDW, POC: 14.8 %
WBC: 7.6 10*3/uL (ref 4.6–10.2)

## 2012-07-22 LAB — COMPREHENSIVE METABOLIC PANEL
ALT: 34 U/L (ref 0–35)
AST: 31 U/L (ref 0–37)
Albumin: 4.6 g/dL (ref 3.5–5.2)
Alkaline Phosphatase: 89 U/L (ref 39–117)
BUN: 13 mg/dL (ref 6–23)
CO2: 29 mEq/L (ref 19–32)
Calcium: 9.7 mg/dL (ref 8.4–10.5)
Chloride: 102 mEq/L (ref 96–112)
Creat: 0.78 mg/dL (ref 0.50–1.10)
Glucose, Bld: 97 mg/dL (ref 70–99)
Potassium: 4.2 mEq/L (ref 3.5–5.3)
Sodium: 140 mEq/L (ref 135–145)
Total Bilirubin: 0.4 mg/dL (ref 0.3–1.2)
Total Protein: 7.5 g/dL (ref 6.0–8.3)

## 2012-07-22 LAB — APTT: aPTT: 36 seconds (ref 24–37)

## 2012-07-22 LAB — PROTIME-INR
INR: 0.93 (ref <1.50)
Prothrombin Time: 12.5 seconds (ref 11.6–15.2)

## 2012-07-22 LAB — POCT SEDIMENTATION RATE: POCT SED RATE: 24 mm/hr — AB (ref 0–22)

## 2012-07-22 NOTE — Progress Notes (Signed)
Subjective: 62 year old lady who has a history of having a lot of problems from last couple years since having complications from I back surgery. She feels like many things have gone downhill on her since that time. She's not able to get access his used to yet. She is concerned about several things. She is noted a knot in the right side of her neck. She was getting a massage in the massage therapist was concerned about it. She says it has been tender. She thinks it was a little more prominent and is now.  She has been having various not sooner skin, and pointed out one in her left upper arm that concerns her.  She is had excessive fatigue, nonspecific. She just feels really washed out at times.  She had an episode of a blood occurring on her pillow one morning. That was a couple of months ago for saw Dr.Guest. She wasn't sure where the blood had come from.  She stopped her statin and occurs of concerns about the knots she was having and muscles.  She has had episodes of a Ranaud's-like phenomena for the past 2 years. To be sitting still in her hands will just turn blue and her feet also. When she is using them they seem to pick back up.  Objective: No major acute distress. Her neck is supple. Chest palpable firmness in the right side of the neck in the region of the hyoid bone. I can feel a deeper but similar place on the opposite side. Her throat is clear. Neck is good range of motion. She has a soft tissue mass in he left upper arm, approximately 1.5 cm in diameter, probably an old.  He did have the blueness of her fingers came in. With a little activity that seemed to pink up some. She does have osteoarthritic nodules forming on her joints.  Assessment Fatigue Prominence of hyoid Lipoma Raynaud' Phenomena History of epistaxis. I can't think of anything as it would've bled like that. She showed me a picture for phone. Lumbar disc disease and multiple aches and pains  Plan: Cervical  x-rays  [facility] reading (PRIMARY) by  Dr. Alwyn Ren Evidence of old lower cervical surgery and plate. She has a little calcification on the right lateral area lateral to the see 2 and 3 overlying the transverse processes. This could be in the area of the knot she feels which on exam what appeared to be just the right into the hyoid bone. I will await radiology reading on this.  Plan: Told her to work harder try to get her focused off for herself and to stay busy and mentally with other things. I think she needs a good physical exam, but has not found anything else major concern. I will let her know the results of the x-ray and labs.

## 2012-07-22 NOTE — Patient Instructions (Addendum)
I advise you set up an appointment to see your primary care for a good physical examination.  I know that you have a number of health concerns, but I think that many of your symptoms are magnified value worrying about them. Allergy to be involved in things that keep her mind busy and of itself.  Try to get as much regular exercise as she can given your condition of your back.  I am checking some basic labs with regard to your fatigue and the Raynaud's phenomenon. I will let you know the results of these in a few days.  I think you would do better on an antidepressant such as certraline.

## 2012-07-23 ENCOUNTER — Telehealth: Payer: Self-pay

## 2012-07-23 LAB — ANA: Anti Nuclear Antibody(ANA): NEGATIVE

## 2012-07-23 NOTE — Telephone Encounter (Signed)
Patient is calling to get results of X-ray. Was told someone would contact her in the next day or so...Marland KitchenMarland KitchenMarland Kitchen [phone].

## 2012-07-24 NOTE — Telephone Encounter (Signed)
Degenerative disc disease at C5-6 with solid anterior fusion at C6- 7. I have called her to advise, left message for her to call me back.

## 2012-07-24 NOTE — Telephone Encounter (Signed)
Dr Alwyn Ren, Pt CB and is very concerned about the findings you saw on cervical xray and wanted to know in detail what radiologist saw. I read pt the findings and she would like your explanation of what the sclerosis w/ spurring is and what the ramifications of it are. Also pt is asking about her lab results. Have you reviewed these yet? We can contact pt back on cell #[phone].

## 2012-07-30 NOTE — Telephone Encounter (Signed)
I cannot find the official reading on this patient. She was were quite concerned about her neck, and I just thought she was feeling her hyoid bone. There is a little nonspecific calcified appearance in her neck, and I was waiting for radiology read. I think the best thing for her to do would be to come back here or see an ENT doctor to get her neck soft tissues rechecked if she is still having symptoms. I apologize for not getting back to her sooner.

## 2012-07-31 NOTE — Telephone Encounter (Signed)
Thanks, I have left message for her to call me back

## 2012-08-05 NOTE — Telephone Encounter (Signed)
She has not returned my calls, I have mailed her a copy of the report with unable to reach letter.

## 2012-08-28 ENCOUNTER — Telehealth: Payer: Self-pay | Admitting: Internal Medicine

## 2012-08-28 NOTE — Telephone Encounter (Signed)
rec'd from [facility] forward 3 pages to Dr.Norins

## 2012-09-02 DIAGNOSIS — M1710 Unilateral primary osteoarthritis, unspecified knee: Secondary | ICD-10-CM | POA: Insufficient documentation

## 2012-09-02 DIAGNOSIS — M179 Osteoarthritis of knee, unspecified: Secondary | ICD-10-CM | POA: Insufficient documentation

## 2012-09-02 HISTORY — DX: Unilateral primary osteoarthritis, unspecified knee: M17.10

## 2012-09-02 HISTORY — DX: Osteoarthritis of knee, unspecified: M17.9

## 2012-09-09 ENCOUNTER — Other Ambulatory Visit: Payer: Self-pay | Admitting: Internal Medicine

## 2012-09-09 ENCOUNTER — Telehealth: Payer: Self-pay | Admitting: Internal Medicine

## 2012-09-09 NOTE — Telephone Encounter (Signed)
Rec'd from [facility] forward 3 pages to Dr.Norins

## 2012-09-18 ENCOUNTER — Telehealth: Payer: Self-pay

## 2012-09-18 MED ORDER — HYDROCODONE-ACETAMINOPHEN 5-325 MG PO TABS: ORAL_TABLET | ORAL | Status: DC

## 2012-09-18 NOTE — Telephone Encounter (Signed)
1 month supply - will need OV.

## 2012-09-18 NOTE — Telephone Encounter (Signed)
Received a fax from [facility] requesting a refill on Hydrocodone 5/325 mg sig: take 1-2 tablets by mouth every 4 to 6 hours as needed for pain. Patient has not seen you since 2012. Please advise. Thanks.

## 2012-09-18 NOTE — Telephone Encounter (Signed)
Hydrocodone called to [facility]

## 2012-09-26 ENCOUNTER — Encounter: Payer: Self-pay | Admitting: Internal Medicine

## 2012-09-26 ENCOUNTER — Ambulatory Visit (INDEPENDENT_AMBULATORY_CARE_PROVIDER_SITE_OTHER): Payer: BC Managed Care – PPO | Admitting: Internal Medicine

## 2012-09-26 VITALS — BP 128/76 | HR 84 | Temp 98.1°F | Ht 65.0 in | Wt 193.8 lb

## 2012-09-26 DIAGNOSIS — R230 Cyanosis: Secondary | ICD-10-CM

## 2012-09-26 DIAGNOSIS — Z0000 Encounter for general adult medical examination without abnormal findings: Secondary | ICD-10-CM

## 2012-09-26 DIAGNOSIS — M2391 Unspecified internal derangement of right knee: Secondary | ICD-10-CM

## 2012-09-26 DIAGNOSIS — I10 Essential (primary) hypertension: Secondary | ICD-10-CM

## 2012-09-26 DIAGNOSIS — G8929 Other chronic pain: Secondary | ICD-10-CM

## 2012-09-26 DIAGNOSIS — M4800 Spinal stenosis, site unspecified: Secondary | ICD-10-CM

## 2012-09-26 DIAGNOSIS — R0789 Other chest pain: Secondary | ICD-10-CM

## 2012-09-26 DIAGNOSIS — M2390 Unspecified internal derangement of unspecified knee: Secondary | ICD-10-CM

## 2012-09-26 MED ORDER — CLONAZEPAM 1 MG PO TABS: 1.0000 mg | ORAL_TABLET | Freq: Every day | ORAL | Status: DC

## 2012-09-26 NOTE — Patient Instructions (Addendum)
PHysician Roster: Poehling - ortho at [facility]; [place] - cards ([facility]); Mendel Ryder - Card; [facility] - rheum; [facility] - Urgent Care; [facility] (to see soon) [facility], ?endocrine; [place]; Dr. Lorenso Courier - Neurosurgeon at [facility];   Seeing Poehling for knee: reviewed care everywhere: knee xray with joint space narrowing, MRI with torn meniscus. May  Be for arthroscopic.  [name] presents for persistent discoloration of the hands and feet when sitting for any period of time with restoration of normal coloration with movement. She has seen Dr. Dareen Piano - his note states that it is unlikely to be Raynaud's - labs ordered but no results available. Most likely neurogenic claudication - damage to nerves most likely in your back  Bone density - had DEXA at [facility] July 1 - L femoral neck T -1.4, forearm t 0.4. MIld osteopenia. She is to see another specialist.  Cholesterol - Last LDL 88 (Sept '10 LDL 74).She is now off medication  Vascular calcification - discussed that she needs calcium 1200 mg daily (diet and supplement)   She is concerned enlargement of the 1st  DIP right, DIP right index. Looks like OA changes.   Dr. Alwyn Ren thought she had calcification of the hyoid right causing a palpable mass at the right neck.  She was seen by Dr. Dulce Sellar for weak and feinting: had chemical nuclear stress test June 26th - negative. By her report Dr. Dulce Sellar thought this was a vaso-vagal issue due to pain. No evaluation for arrythmia.  She has a tender nodule left posterior proximal UE.   She has continued peripheral neuropathy right foot. - and you have stopped the gabapentin for now.   Try to insure that we get copies of tests done outside of the [facility] or [facility] systems.

## 2012-09-26 NOTE — Progress Notes (Signed)
Subjective:    Patient ID: [name], female    DOB: 30-Sep-1950, 62 y.o.   MRN: [id number]  HPI Mrs Georganna Skeans presents for an annual medical exam. In the interval since her last visit she has had a lot of medical evaluation, testing and some treatment.  PHysician Roster: Poehling - ortho at [facility]; [place] - cards ([facility]); Mendel Ryder - Card; [facility] - rheum; [facility] - Urgent Care; [facility] (to see soon) [facility], ?endocrine; [place]; Dr. Lorenso Courier - Neurosurgeon at [facility];   Seeing Poehling for knee: reviewed care everywhere: knee xray with joint space narrowing, MRI with torn meniscus. May  Be for arthroscopic repair.  [name] c/o persistent discoloration of the hands and feet when sitting for any period of time with restoration of normal coloration with movement. She has seen Dr. Dareen Piano - his note states that it is unlikely to be Raynaud's - labs ordered but no results available.  Bone density - had DEXA at [facility] July 1 - L femoral neck T -1.4, forearm t 0.4. MIld osteopenia. She is to see another specialist to review these results. She does understand that she does not have osteoporosis but does have minor osteopenia  Cholesterol - Last LDL 88 (Sept '10 LDL 74).She is now off medication. Reviewed previous correspondence, phone notes and records from Dr. Verdis Prime.  Vascular calcification - seen on imaging studies as incidental finding. We discussed that she needs calcium 1200 mg daily (diet and supplement)   She is concerned enlargement of the 1st  DIP right, DIP right index. Looks like OA changes. Is established with Dr. Dareen Piano for rheumatology.  Dr. Alwyn Ren thought she had calcification of the hyoid right causing a palpable mass at the right neck.  She was seen by Dr. Dulce Sellar for weakness and feinting: had chemical nuclear stress test June 26th - negative. By her report Dr. Dulce Sellar thought this was a vaso-vagal issue due to pain. She had no  evaluation for arrythmia.  She has a tender nodule left posterior proximal UE.   She has continued peripheral neuropathy right foot. -   Past Medical History  Diagnosis Date  . Hypertension   . Migraine headache     hormonal related - less frequent off hormone replacement  . Synovial cyst of lumbar facet joint     lower back - s/p surgery   . Derangement of knee, left 2009    started with fall at work. Has had repair patella and torn meniscus  . Migraines   . Hx of appendectomy   . High cholesterol   . Allergy   . Arthritis    Past Surgical History  Procedure Laterality Date  . Ovarian cyst surgery  1972  . Appendectomy  1971  . Cervical discectomy  1999    diskectomy with fixation:plate and screws  . Synovial cyst excision  2007    lumbar spine  . Patellar reefing  '09    repair of fractured patella after fall  . Knee arthroscopy w/ meniscal repair  09    left knee  . Venous ablation      for pain in the groin - [facility]TS did procedure. Has some residual discomfort at the  ablation site.   . L4-5 lumbar fusion  September 02, 2010    Dr. Gerlene Fee:  minimally invasive transforaminal interbody fusion with spacer  . Spine surgery     Family History  Problem Relation Age of Onset  . Diabetes Mother   . COPD  Mother   . Aortic aneurysm Mother   . Hypertension Mother   . Cancer Father     lung  . Hyperlipidemia Father   . Hypertension Father   . Heart disease Father   . Diabetes Father   . Diabetes Brother   . Hypothyroidism Mother    History   Social History  . Marital Status: Widowed    Spouse Name: N/A    Number of Children: N/A  . Years of Education: 14   Occupational History  . 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    Social History Main Topics  . Smoking status: Never Smoker   . Smokeless tobacco: Never Used  . Alcohol Use: No  . Drug Use: No  . Sexually Active: Yes -- Female partner(s)    Birth Control/ Protection: None   Other Topics Concern  . 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 Married '71- '10/widowed; engaged. 1 dtr- '72; 1 son '73; 4 grandchildren, 2 step-grands. 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       Current Outpatient Prescriptions on File Prior to Visit  Medication Sig Dispense Refill  . amitriptyline (ELAVIL) 10 MG tablet Take 40 mg by mouth at bedtime.       . Ascorbic Acid (VITAMIN C) 500 MG CAPS Take 500 mg by mouth every other day.       Marland Kitchen aspirin EC 81 MG tablet Take 81 mg by mouth daily.      . B Complex Vitamins (VITAMIN B COMPLEX PO) Take 1 tablet by mouth daily.       . Beclomethasone Diprop Monohyd (VANCENASE AQ DOUBLE STRENGTH NA) Place 1 spray into the nose 2 (two) times daily as needed (for allergies).      . cyclobenzaprine (FLEXERIL) 10 MG tablet Take 10 mg by mouth 2 (two) times daily as needed for muscle spasms.       . famotidine (PEPCID) 20 MG tablet Take 20 mg by mouth daily.      . hydrochlorothiazide (HYDRODIURIL) 25 MG tablet TAKE 1 TABLET (25 MG TOTAL) BY MOUTH DAILY.  30 tablet  11  . HYDROcodone-acetaminophen (NORCO/VICODIN) 5-325 MG per tablet TAKE 1-2 TABLETS EVERY 4-6 HOURS AS NEED FOR PAIN. PATIENT IS DUE FOR AN OFFICE VISIT BEFORE FURTHER REFILLS.  120 tablet  0  . magnesium gluconate (MAGONATE) 500 MG tablet Take 500 mg by mouth every other day.      . Multiple Vitamin (MULTIVITAMIN WITH MINERALS) TABS Take 1 tablet by mouth daily.      . naproxen sodium (ANAPROX) 220 MG tablet Take 440 mg by mouth 2 (two) times daily as needed (pain).      Marland Kitchen NEXIUM 40 MG capsule TAKE 1 CAPSULE BY MOUTH DAILY BEFORE BREAKFAST.  30 capsule  6  . potassium gluconate (TH POTASSIUM GLUCONATE) 595 MG TABS Take 595 mg by mouth every other day.      Marland Kitchen PRESCRIPTION MEDICATION Take 1 tablet by mouth 2 (two) times daily as needed (pain). Hydrocodone product from [facility]      . vitamin E 400 UNIT capsule Take 400  Units by mouth every other day.        No current facility-administered medications on file prior to visit.      Review of Systems System review is negative for any constitutional, cardiac, pulmonary, GI or neuro symptoms or complaints other than as  described in the HPI.     Objective:   Physical Exam Filed Vitals:   09/26/12 1435  BP: 128/76  Pulse: 84  Temp: 98.1 F (36.7 C)   Wt Readings from Last 3 Encounters:  09/26/12 193 lb 12.8 oz (87.907 kg)  07/22/12 192 lb (87.091 kg)  06/04/12 189 lb (85.73 kg)   BP Readings from Last 3 Encounters:  09/26/12 128/76  07/22/12 152/82  06/11/12 135/74   Gen'l - an overweight white woman in no distress HEENT - C&S clear Neck - no palpable mass right neck. No prominent hyoid bone of anterior neck. Very firm sternocleidomastoid muscle.  Node- no palpable nodes submandibular or cervical Cor 2+ radial pulse RRR. Peripheral cyanosis at rest with dependent hands and feet resumed normal color with movement. Pulm - normal respirations       Assessment & Plan:

## 2012-09-28 DIAGNOSIS — R230 Cyanosis: Secondary | ICD-10-CM

## 2012-09-28 HISTORY — DX: Cyanosis: R23.0

## 2012-09-28 NOTE — Assessment & Plan Note (Signed)
Multiple sources of pain: radicular pain from lumbar disease and post-operative pain; neck pain from cervical disk disease; left knee pain from trauma.  Plan - off prednisone           Will continue percocet at 1  5/325 norco every 6 hours prn           NSAID - trial of meloxicam 74m g once a day           Off gabapentin            Executed a new pain contract today.

## 2012-09-28 NOTE — Assessment & Plan Note (Signed)
Full workup on several occasions for CAD - negative to date. Discussed vascular calcification and that the relationship to obstructive coronary disease is weak.  Plan Per Dr.Munley.

## 2012-09-28 NOTE — Assessment & Plan Note (Signed)
S/p multiple surgeries. She attributes many of her problems and pain to this problem  Plan Per Dr. Lorenso Courier, neurosurgeon at [facility].

## 2012-09-28 NOTE — Assessment & Plan Note (Signed)
The majority of this encounter was devoted to review of her multiple evaluations elsewhere including the use of Care Everywhere to review in detail her evaluations at [facility]. She reports she is current with her gyn. She has had mammography. She is due colonoscopy unless she can produce records of evaluation in the last 10 years. She has had DEXA which reveals mild osteopenia and she will be seeing a Research scientist (medical) (endocrinologist ?) about this.  (greater than 50% of 60 min visit spent on education and counseling)

## 2012-09-28 NOTE — Assessment & Plan Note (Signed)
Phenomena w/o evidence of underlying rheumatologic or malignant disease. Has had Rheumatology evaluation by Dr. Dareen Piano.

## 2012-09-28 NOTE — Assessment & Plan Note (Signed)
Now following with Dr. Randall An at [facility]. Records reviewed - torn miniscus of the knee  Plan Per Dr. Randall An

## 2012-09-28 NOTE — Assessment & Plan Note (Signed)
BP Readings from Last 3 Encounters:  09/26/12 128/76  07/22/12 152/82  06/11/12 135/74   Adequate control.

## 2012-09-30 DIAGNOSIS — S83282A Other tear of lateral meniscus, current injury, left knee, initial encounter: Secondary | ICD-10-CM

## 2012-09-30 HISTORY — DX: Other tear of lateral meniscus, current injury, left knee, initial encounter: S83.282A

## 2012-10-30 ENCOUNTER — Ambulatory Visit: Payer: BC Managed Care – PPO

## 2012-10-30 ENCOUNTER — Ambulatory Visit (INDEPENDENT_AMBULATORY_CARE_PROVIDER_SITE_OTHER): Payer: BC Managed Care – PPO | Admitting: Family Medicine

## 2012-10-30 VITALS — BP 118/74 | HR 88 | Temp 98.3°F | Resp 18 | Ht 64.5 in | Wt 192.0 lb

## 2012-10-30 DIAGNOSIS — M25441 Effusion, right hand: Secondary | ICD-10-CM

## 2012-10-30 DIAGNOSIS — M7989 Other specified soft tissue disorders: Secondary | ICD-10-CM

## 2012-10-30 DIAGNOSIS — S6990XA Unspecified injury of unspecified wrist, hand and finger(s), initial encounter: Secondary | ICD-10-CM

## 2012-10-30 DIAGNOSIS — S6980XA Other specified injuries of unspecified wrist, hand and finger(s), initial encounter: Secondary | ICD-10-CM

## 2012-10-30 DIAGNOSIS — S6991XA Unspecified injury of right wrist, hand and finger(s), initial encounter: Secondary | ICD-10-CM

## 2012-10-30 NOTE — Patient Instructions (Addendum)
1.  NO USE OF R HAND. 2.  VERY IMPORTANT TO SEE DR. Luiz Blare IN THE UPCOMING WEEK.

## 2012-10-30 NOTE — Progress Notes (Signed)
[address]   [place], [place]  [address]   [phone]  Subjective:    Patient ID: [name], female    DOB: 08-19-1950, 62 y.o.   MRN: [id number]  HPI This 62 y.o. female presents for evaluation of finger injury. Injury occurred 3.5 weeks ago. Got feet tangled up in shrubs in yard; has bad L knee; has bad back that is weak; went face forward; landed on outstretched hand; has appointment with back doctor coming up.  For first three days had to lay around.  Thought may have jammed finger; now still swollen at PIP joint; swelling is down.  Cannot get ring off.  When tries to use finger L fourth, swells more; unable to flex DIP.  No n/t.  Decreased ROM of DIP and PIP; swelling only at PIP joint; unable to get ring off but no compression along proximal finger.  R hand dominant.   Review of Systems  Constitutional: Negative for fever, chills, diaphoresis and fatigue.  Musculoskeletal: Positive for myalgias, joint swelling and arthralgias.  Skin: Negative for wound.  Neurological: Positive for weakness. Negative for numbness.   Past Medical History  Diagnosis Date  . Hypertension   . Migraine headache     hormonal related - less frequent off hormone replacement  . Synovial cyst of lumbar facet joint     lower back - s/p surgery   . Derangement of knee, left 2009    started with fall at work. Has had repair patella and torn meniscus  . Migraines   . Hx of appendectomy   . High cholesterol   . Allergy   . Arthritis    Past Surgical History  Procedure Laterality Date  . Ovarian cyst surgery  1972  . Appendectomy  1971  . Cervical discectomy  1999    diskectomy with fixation:plate and screws  . Synovial cyst excision  2007    lumbar spine  . Patellar reefing  '09    repair of fractured patella after fall  . Knee arthroscopy w/ meniscal repair  09    left knee  . Venous ablation      for pain in the groin - [facility] did procedure. Has some residual discomfort at the   ablation site.   . L4-5 lumbar fusion  September 02, 2010    Dr. Gerlene Fee:  minimally invasive transforaminal interbody fusion with spacer  . Spine surgery     Allergies  Allergen Reactions  . Caine-1 [Lidocaine Hcl] Palpitations  . Celebrex [Celecoxib] Palpitations  . Codeine Palpitations  . Epinephrine Palpitations    NASAL SPRAY  . Other Palpitations    Glucocorticoids/Steroids  . Procaine Hcl [Procaine Hcl] Palpitations  . Sulfa Antibiotics Palpitations   Current Outpatient Prescriptions on File Prior to Visit  Medication Sig Dispense Refill  . amitriptyline (ELAVIL) 10 MG tablet Take 40 mg by mouth at bedtime.       . Ascorbic Acid (VITAMIN C) 500 MG CAPS Take 500 mg by mouth every other day.       Marland Kitchen aspirin EC 81 MG tablet Take 81 mg by mouth daily.      . B Complex Vitamins (VITAMIN B COMPLEX PO) Take 1 tablet by mouth daily.       . Beclomethasone Diprop Monohyd (VANCENASE AQ DOUBLE STRENGTH NA) Place 1 spray into the nose 2 (two) times daily as needed (for allergies).      . clonazePAM (KLONOPIN) 1 MG tablet Take 1 tablet (1  mg total) by mouth at bedtime.  30 tablet  5  . cyclobenzaprine (FLEXERIL) 10 MG tablet Take 10 mg by mouth 2 (two) times daily as needed for muscle spasms.       . famotidine (PEPCID) 20 MG tablet Take 20 mg by mouth daily.      . hydrochlorothiazide (HYDRODIURIL) 25 MG tablet TAKE 1 TABLET (25 MG TOTAL) BY MOUTH DAILY.  30 tablet  11  . HYDROcodone-acetaminophen (NORCO/VICODIN) 5-325 MG per tablet TAKE 1-2 TABLETS EVERY 4-6 HOURS AS NEED FOR PAIN. PATIENT IS DUE FOR AN OFFICE VISIT BEFORE FURTHER REFILLS.  120 tablet  0  . magnesium gluconate (MAGONATE) 500 MG tablet Take 500 mg by mouth every other day.      . Multiple Vitamin (MULTIVITAMIN WITH MINERALS) TABS Take 1 tablet by mouth daily.      . naproxen sodium (ANAPROX) 220 MG tablet Take 440 mg by mouth 2 (two) times daily as needed (pain).      Marland Kitchen NEXIUM 40 MG capsule TAKE 1 CAPSULE BY MOUTH DAILY BEFORE  BREAKFAST.  30 capsule  6  . potassium gluconate (TH POTASSIUM GLUCONATE) 595 MG TABS Take 595 mg by mouth every other day.      Marland Kitchen PRESCRIPTION MEDICATION Take 1 tablet by mouth 2 (two) times daily as needed (pain). Hydrocodone product from [facility]      . vitamin E 400 UNIT capsule Take 400 Units by mouth every other day.        No current facility-administered medications on file prior to visit.       Objective:   Physical Exam  Nursing note and vitals reviewed. Constitutional: She is oriented to person, place, and time. She appears well-developed and well-nourished.  Musculoskeletal:  R hand 4th digit:  Moderate swelling PIP with decreased ROM.  Unable to actively flex DIP joint; no swelling at DIP joint.  +mild TTP along PIP joint.    Neurological: She is oriented to person, place, and time. No sensory deficit.   [facility] reading (PRIMARY) by  Dr.Smith.  4th finger:  ?Distal proximal phalanx fracture at PIP joint ND; +arthritic changes at PIP joint.      Assessment & Plan:  Finger injury, right, initial encounter - Plan: DG Finger Ring Right, Ambulatory referral to Orthopedic Surgery  Swelling of finger joint of right hand   1.  Finger Injury R 4th:  New.  Concern for underlying fracture with tendon involvement; high risk for permanent boutinneire deformity; finger splint placed; refer to ortho within upcoming week.  Copy of xrays provided and discussed importance of ortho evaluation. 2.  Swelling of R 5th finger PIP joint:  New.  With inability to flex DIP of affected finger; concern for tendon pathology +/- fracture; splint placed; advised to avoid use of R hand.

## 2012-11-01 ENCOUNTER — Other Ambulatory Visit: Payer: Self-pay | Admitting: Internal Medicine

## 2012-11-01 NOTE — Telephone Encounter (Signed)
Ok to refill 30d, no refill (per protocol covering for absent PCP) - prescription printed and signed - placed on triage desk  

## 2012-11-01 NOTE — Telephone Encounter (Signed)
MD out of office. Is this ok to refill.../lmb 

## 2012-11-01 NOTE — Telephone Encounter (Signed)
Faxed script back to [facility].../lmb 

## 2012-11-08 ENCOUNTER — Encounter: Payer: Self-pay | Admitting: Internal Medicine

## 2012-11-08 DIAGNOSIS — S6990XA Unspecified injury of unspecified wrist, hand and finger(s), initial encounter: Secondary | ICD-10-CM | POA: Insufficient documentation

## 2012-12-02 DIAGNOSIS — M2240 Chondromalacia patellae, unspecified knee: Secondary | ICD-10-CM | POA: Insufficient documentation

## 2012-12-02 HISTORY — DX: Chondromalacia patellae, unspecified knee: M22.40

## 2012-12-04 ENCOUNTER — Telehealth: Payer: Self-pay

## 2012-12-04 NOTE — Telephone Encounter (Signed)
Received a fax from [facility] [address] [phone]) stating patient is requesting a pneumonia vaccine. Please advise. Thanks.

## 2012-12-04 NOTE — Telephone Encounter (Signed)
OK for pnuemonia vaccine: prefer Prevnar now with pneumovax 8 weeks later.

## 2012-12-05 MED ORDER — PNEUMOCOCCAL 13-VAL CONJ VACC IM SUSP: 0.5000 mL | Freq: Once | INTRAMUSCULAR | Status: DC

## 2012-12-05 NOTE — Telephone Encounter (Signed)
Order sent for prevnar to [facility]

## 2012-12-12 ENCOUNTER — Other Ambulatory Visit: Payer: Self-pay

## 2012-12-13 MED ORDER — HYDROCODONE-ACETAMINOPHEN 5-325 MG PO TABS: ORAL_TABLET | ORAL | Status: DC

## 2012-12-16 ENCOUNTER — Telehealth: Payer: Self-pay | Admitting: *Deleted

## 2012-12-16 NOTE — Telephone Encounter (Signed)
On medication list there is a refill for hydrocodone #120 dated 12/12/12

## 2012-12-16 NOTE — Telephone Encounter (Signed)
Pt requesting hydrocodone refill.  Please advise

## 2012-12-17 NOTE — Telephone Encounter (Signed)
I spoke to patient and let her know Hydrocodone was accidentally called to [facility] [address]. She states she will have her husband go pick it up. She asks how the mix up can happen if [facility] requested it. I let her know it did not get changed electronically and this unfortunately can happen when a patient has more than one pharmacy listed and the old one does not get taken off.  I did apologize.

## 2012-12-19 ENCOUNTER — Other Ambulatory Visit: Payer: Self-pay | Admitting: *Deleted

## 2012-12-19 MED ORDER — HYDROCODONE-ACETAMINOPHEN 5-325 MG PO TABS: ORAL_TABLET | ORAL | Status: DC

## 2012-12-19 NOTE — Telephone Encounter (Signed)
Donnetta Simpers, CMA called [facility] spoke with Darel Hong; cancelled Hydrocodone Rx that was called in on 10.2.14.  Hardcopy printed pt aware, Rx can be picked up later today.

## 2012-12-22 ENCOUNTER — Ambulatory Visit (INDEPENDENT_AMBULATORY_CARE_PROVIDER_SITE_OTHER): Payer: BC Managed Care – PPO | Admitting: Emergency Medicine

## 2012-12-22 VITALS — BP 126/84 | HR 85 | Temp 98.1°F | Resp 16 | Ht 64.5 in | Wt 196.0 lb

## 2012-12-22 DIAGNOSIS — R221 Localized swelling, mass and lump, neck: Secondary | ICD-10-CM

## 2012-12-22 DIAGNOSIS — Z23 Encounter for immunization: Secondary | ICD-10-CM

## 2012-12-22 DIAGNOSIS — R220 Localized swelling, mass and lump, head: Secondary | ICD-10-CM

## 2012-12-22 MED ORDER — AMOXICILLIN 875 MG PO TABS: 875.0000 mg | ORAL_TABLET | Freq: Two times a day (BID) | ORAL | Status: DC

## 2012-12-22 NOTE — Progress Notes (Signed)
Subjective:  This chart was scribed for Taylor Edin, MD by Arlan Organ, ED Scribe. This patient was seen in room Room 4 and the patient's care was started 12:50 PM.    Patient ID: [name], female    DOB: 01/27/1951, 62 y.o.   MRN: [id number]  HPI HPI Comments: [name] is a 62 y.o. female who presents to [facility] complaining of chest congestion that started 3 weeks ago. Pt states she has recently experienced an associated producive cough consisting of green sputum. She states she has been taking Mucinex with mild relief. She says she has some sinus changes when seasons change. Pt denies ever smoking. She states no one in her household is currently sick or experiencing the same or similar symptoms. Pt states she is not currently allergic to any antibiotics, but is allergic to medications containing codeine.  Pt is requesting a referral for a ENT physician for an enlargement on her neck that has been present consistently since May 2014. Pt states she has seen by Dr. Alwyn Ren before for the enlarement.   Review of Systems  Constitutional: Negative for fever.  HENT: Positive for congestion.   Respiratory: Positive for cough.     Past Medical History  Diagnosis Date  . Hypertension   . Migraine headache     hormonal related - less frequent off hormone replacement  . Synovial cyst of lumbar facet joint     lower back - s/p surgery   . Derangement of knee, left 2009    started with fall at work. Has had repair patella and torn meniscus  . Migraines   . Hx of appendectomy   . High cholesterol   . Allergy   . Arthritis     History   Social History  . Marital Status: Widowed    Spouse Name: N/A    Number of Children: N/A  . Years of Education: 14   Occupational History  . 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    Social History Main Topics  . Smoking status: Never Smoker   . Smokeless tobacco: Never Used  . Alcohol Use: No  . Drug Use: No  . Sexual Activity: Yes   Partners: Male    Birth Control/ Protection: None   Other Topics Concern  . 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 1 dtr- '72; 1 son '73; 4 grandchildren, 2 step-grands. 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     Past Surgical History  Procedure Laterality Date  . Ovarian cyst surgery  1972  . Appendectomy  1971  . Cervical discectomy  1999    diskectomy with fixation:plate and screws  . Synovial cyst excision  2007    lumbar spine  . Patellar reefing  '09    repair of fractured patella after fall  . Knee arthroscopy w/ meniscal repair  09    left knee  . Venous ablation      for pain in the groin - [facility] did procedure. Has some residual discomfort at the  ablation site.   . L4-5 lumbar fusion  September 02, 2010    Dr. Gerlene Fee:  minimally invasive transforaminal interbody fusion with spacer  . Spine surgery      Results for orders placed in visit on 07/22/12  COMPREHENSIVE METABOLIC PANEL      Result Value Range   Sodium 140  135 - 145 mEq/L   Potassium 4.2  3.5 - 5.3 mEq/L   Chloride 102  96 - 112 mEq/L   CO2 29  19 - 32 mEq/L   Glucose, Bld 97  70 - 99 mg/dL   BUN 13  6 - 23 mg/dL   Creat 1.19  1.47 - 8.29 mg/dL   Total Bilirubin 0.4  0.3 - 1.2 mg/dL   Alkaline Phosphatase 89  39 - 117 U/L   AST 31  0 - 37 U/L   ALT 34  0 - 35 U/L   Total Protein 7.5  6.0 - 8.3 g/dL   Albumin 4.6  3.5 - 5.2 g/dL   Calcium 9.7  8.4 - 56.2 mg/dL  ANA      Result Value Range   ANA NEG  NEGATIVE  PROTIME-INR      Result Value Range   Prothrombin Time 12.5  11.6 - 15.2 seconds   INR 0.93  <1.50  APTT      Result Value Range   aPTT 36  24 - 37 seconds  POCT CBC      Result Value Range   WBC 7.6  4.6 - 10.2 K/uL   Lymph, poc 1.9  0.6 - 3.4   POC LYMPH PERCENT 24.5  10 - 50 %L   MID (cbc) 0.4  0 - 0.9   POC MID % 5.3  0 - 12 %M    POC Granulocyte 5.3  2 - 6.9   Granulocyte percent 70.2  37 - 80 %G   RBC 4.89  4.04 - 5.48 M/uL   Hemoglobin 14.4  12.2 - 16.2 g/dL   HCT, POC 13.0  86.5 - 47.9 %   MCV 92.7  80 - 97 fL   MCH, POC 29.4  27 - 31.2 pg   MCHC 31.8  31.8 - 35.4 g/dL   RDW, POC 78.4     Platelet Count, POC 292  142 - 424 K/uL   MPV 8.1  0 - 99.8 fL  POCT SEDIMENTATION RATE      Result Value Range   POCT SED RATE 24 (*) 0 - 22 mm/hr     Objective:  Physical Exam  Constitutional: She is oriented to person, place, and time. She appears well-developed and well-nourished. No distress.  HENT:  Head: Normocephalic.  Tender over right maxillary sinus  Right occlusion of right external auditory canal secondary to cerumen.  Eyes: Conjunctivae are normal. Pupils are equal, round, and reactive to light. No scleral icterus.  Neck: Normal range of motion. Neck supple. No thyromegaly present.  Fullness on right side of neck, but no definite mass.   Cardiovascular: Normal rate and regular rhythm.  Exam reveals no gallop and no friction rub.   No murmur heard. Pulmonary/Chest: Effort normal and breath sounds normal. No respiratory distress. She has no wheezes. She has no rales.  Abdominal: Soft. Bowel sounds are normal. She exhibits no distension. There is no tenderness. There is no rebound.  Musculoskeletal: Normal range of motion.  Neurological: She is alert and oriented to person, place, and time.  Skin: Skin is warm and dry. No rash noted.  Psychiatric: She has a normal mood and affect. Her behavior is normal.    Assessment & Plan:  We'll refer to ENT for evaluation of the right neck abnormality. Placed on antibiotics for her right maxillary sinus sinusitis

## 2012-12-22 NOTE — Patient Instructions (Signed)

## 2012-12-25 ENCOUNTER — Encounter: Payer: Self-pay | Admitting: Internal Medicine

## 2012-12-27 ENCOUNTER — Telehealth: Payer: Self-pay | Admitting: Internal Medicine

## 2012-12-27 ENCOUNTER — Other Ambulatory Visit: Payer: Self-pay | Admitting: Physician Assistant

## 2012-12-27 DIAGNOSIS — R220 Localized swelling, mass and lump, head: Secondary | ICD-10-CM

## 2012-12-27 NOTE — Telephone Encounter (Signed)
Rec'd from [facility] forward 4 pages to Dr.Norins ° °

## 2013-01-08 ENCOUNTER — Ambulatory Visit
Admission: RE | Admit: 2013-01-08 | Discharge: 2013-01-08 | Disposition: A | Discharge status: Home / Self Care | Payer: BC Managed Care – PPO | Source: Ambulatory Visit | Attending: Physician Assistant | Admitting: Physician Assistant

## 2013-01-08 DIAGNOSIS — R220 Localized swelling, mass and lump, head: Secondary | ICD-10-CM

## 2013-01-08 MED ORDER — IOHEXOL 300 MG/ML  SOLN
75.0000 mL | Freq: Once | INTRAMUSCULAR | Status: AC | PRN
  Administered 2013-01-08: 75 mL via INTRAVENOUS

## 2013-03-09 ENCOUNTER — Ambulatory Visit (INDEPENDENT_AMBULATORY_CARE_PROVIDER_SITE_OTHER): Payer: BC Managed Care – PPO | Admitting: Family Medicine

## 2013-03-09 VITALS — BP 140/80 | HR 86 | Temp 98.7°F | Resp 16 | Ht 65.0 in | Wt 191.0 lb

## 2013-03-09 DIAGNOSIS — R6889 Other general symptoms and signs: Secondary | ICD-10-CM

## 2013-03-09 DIAGNOSIS — J329 Chronic sinusitis, unspecified: Secondary | ICD-10-CM

## 2013-03-09 LAB — POCT INFLUENZA A/B
Influenza A, POC: NEGATIVE
Influenza B, POC: NEGATIVE

## 2013-03-09 MED ORDER — AMOXICILLIN 875 MG PO TABS: 875.0000 mg | ORAL_TABLET | Freq: Two times a day (BID) | ORAL | Status: DC

## 2013-03-09 MED ORDER — HYDROCOD POLST-CHLORPHEN POLST 10-8 MG/5ML PO LQCR: 5.0000 mL | Freq: Two times a day (BID) | ORAL | Status: DC | PRN

## 2013-03-09 MED ORDER — IPRATROPIUM BROMIDE 0.03 % NA SOLN: 2.0000 | Freq: Four times a day (QID) | NASAL | Status: DC

## 2013-03-09 NOTE — Progress Notes (Signed)
Subjective:  This chart was scribed for Norberto Sorenson, MD by Carl Best, Medical Scribe. This patient was seen in Room 9 and the patient's care was started at 11:58 AM.   Patient ID: [name], female    DOB: [birth year], 62 y.o.   MRN: [id number] Chief Complaint  Patient presents with  . Flu symptoms    bodyaches, sneezing, headache x 2 days  . Sore Throat    HPI HPI Comments: [name] is a 62 y.o. female who presents to the [facility] complaining of constant headache located in her frontal region, sore throat, chills, and myalgias that started yesterday morning.  She states that her symptoms started with sinus congestion and mild sinus pressure for a couple of weeks.  She states that she treated her initial symptoms with Mucinex.  She states that she was sneezing and her mucous was clear in color.  She denies otalgia, sleep disturbances, and dental pain as associated symptoms.  She lists decreased appetite, postnasal drip, and non-productive cough as associated symptoms.  She states that she started taking Tylenol and Zicam yesterday.    She states that she has a knot in the back of her neck.  She states that she has had multiple CT scans and has had no definitive results.    She states that she has received the flu shot this year.     Past Medical History  Diagnosis Date  . Hypertension   . Migraine headache     hormonal related - less frequent off hormone replacement  . Synovial cyst of lumbar facet joint     lower back - s/p surgery   . Derangement of knee, left 2009    started with fall at work. Has had repair patella and torn meniscus  . Migraines   . Hx of appendectomy   . High cholesterol   . Allergy   . Arthritis    Past Surgical History  Procedure Laterality Date  . Ovarian cyst surgery  1972  . Appendectomy  1971  . Cervical discectomy  1999    diskectomy with fixation:plate and screws  . Synovial cyst excision  2007     lumbar spine  . Patellar reefing  '09    repair of fractured patella after fall  . Knee arthroscopy w/ meniscal repair  09    left knee  . Venous ablation      for pain in the groin - [facility] did procedure. Has some residual discomfort at the  ablation site.   . L4-5 lumbar fusion  September 02, 2010    Dr. Gerlene Fee:  minimally invasive transforaminal interbody fusion with spacer  . Spine surgery     Family History  Problem Relation Age of Onset  . Diabetes Mother   . COPD Mother   . Aortic aneurysm Mother   . Hypertension Mother   . Cancer Father     lung  . Hyperlipidemia Father   . Hypertension Father   . Heart disease Father   . Diabetes Father   . Diabetes Brother   . Hypothyroidism Mother    History   Social History  . Marital Status: Widowed    Spouse Name: N/A    Number of Children: N/A  . Years of Education: 14   Occupational History  . 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    Social History Main Topics  . Smoking status: Never Smoker   . Smokeless tobacco: Never Used  . Alcohol  Use: No  . Drug Use: No  . Sexual Activity: Yes    Partners: Male    Birth Control/ Protection: None   Other Topics Concern  . 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 1 dtr- '72; 1 son '73; 4 grandchildren, 2 step-grands. 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    Allergies  Allergen Reactions  . Caine-1 [Lidocaine Hcl] Palpitations  . Celebrex [Celecoxib] Palpitations  . Codeine Palpitations  . Epinephrine Palpitations    NASAL SPRAY  . Other Palpitations    Glucocorticoids/Steroids  . Procaine Hcl [Procaine Hcl] Palpitations  . Sulfa Antibiotics Palpitations     Review of Systems  Constitutional: Positive for chills and appetite change (decreased). Negative for fever.  HENT: Positive for congestion, postnasal drip, rhinorrhea, sinus  pressure, sneezing and sore throat. Negative for dental problem and ear pain.   Respiratory: Positive for cough.   Gastrointestinal: Negative for vomiting, abdominal pain and diarrhea.  Genitourinary: Negative for decreased urine volume.  Musculoskeletal: Positive for myalgias and neck stiffness.  Neurological: Positive for headaches.  Hematological: Positive for adenopathy.  Psychiatric/Behavioral: Negative for sleep disturbance.     BP 140/80  Pulse 86  Temp(Src) 98.7 F (37.1 C) (Oral)  Resp 16  Ht 5\' 5"  (1.651 m)  Wt 191 lb (86.637 kg)  BMI 31.78 kg/m2  SpO2 98% Objective:  Physical Exam  Nursing note and vitals reviewed. Constitutional: She is oriented to person, place, and time. She appears well-developed and well-nourished. No distress.  HENT:  Head: Normocephalic.  Right Ear: Tympanic membrane, external ear and ear canal normal.  Left Ear: Tympanic membrane, external ear and ear canal normal.  Nose: Nose normal.  Mouth/Throat: Uvula is midline. Posterior oropharyngeal erythema present. No oropharyngeal exudate or posterior oropharyngeal edema.  Eyes: Conjunctivae and EOM are normal. Pupils are equal, round, and reactive to light.  Neck: Normal range of motion. Neck supple. No thyromegaly present.  Cardiovascular: Normal rate, regular rhythm and normal heart sounds.   Pulmonary/Chest: Effort normal and breath sounds normal. No respiratory distress. She has no decreased breath sounds. She has no wheezes. She has no rhonchi. She has no rales.  Musculoskeletal: Normal range of motion.  Lymphadenopathy:       Head (right side): No submandibular, no tonsillar, no preauricular and no posterior auricular adenopathy present.       Head (left side): No submandibular, no tonsillar, no preauricular and no posterior auricular adenopathy present.    She has cervical adenopathy.       Right cervical: Superficial cervical adenopathy present. No posterior cervical adenopathy present.       Left cervical: Superficial cervical adenopathy present. No posterior cervical adenopathy present.       Right: No supraclavicular adenopathy present.       Left: No supraclavicular adenopathy present.  Neurological: She is alert and oriented to person, place, and time.  Skin: Skin is warm and dry.  Psychiatric: She has a normal mood and affect. Her behavior is normal.    Results for orders placed in visit on 03/09/13  POCT INFLUENZA A/B      Result Value Range   Influenza A, POC Negative     Influenza B, POC Negative       Assessment & Plan:  Discussed a clinical suspicion of a viral illness with the patient due to the  lack of a localized infection. Advised the patient to rest and drink as many fluids as possible over the next 48 hours and keep implementing symptomatic care.  Gave the patient a prescription for an antibiotic and advised her to only fill the prescription if she develops fever above 101 degrees over three days and sinus pain with localized purulent, bloody sinus drainage.  Flu-like symptoms - Plan: POCT Influenza A/B  Sinusitis, chronic  Meds ordered this encounter  Medications  . DISCONTD: amoxicillin (AMOXIL) 875 MG tablet    Sig: Take 1 tablet (875 mg total) by mouth 2 (two) times daily.    Dispense:  20 tablet    Refill:  0  . ipratropium (ATROVENT) 0.03 % nasal spray    Sig: Place 2 sprays into the nose 4 (four) times daily.    Dispense:  30 mL    Refill:  1  . DISCONTD: chlorpheniramine-HYDROcodone (TUSSIONEX PENNKINETIC ER) 10-8 MG/5ML LQCR    Sig: Take 5 mLs by mouth every 12 (twelve) hours as needed.    Dispense:  120 mL    Refill:  0    I personally performed the services described in this documentation, which was scribed in my presence. The recorded information has been reviewed and considered, and addended by me as needed.  Norberto Sorenson, MD MPH

## 2013-03-09 NOTE — Patient Instructions (Signed)
Hot showers or breathing in steam may help loosen the congestion.  Using a netti pot or sinus rinse is also likely to help you feel better and keep this from progressing.  Use the atrovent nasal spray as needed throughout the day.  I recommend augmenting with generic mucinex to help you move out the congestion.  If no improvement or you are getting worse, come back as you might need a course of steroids but hopefully with all of the above, you can avoid it.   Upper Respiratory Infection, Adult An upper respiratory infection (URI) is also known as the common cold. It is often caused by a type of germ (virus). Colds are easily spread (contagious). You can pass it to others by kissing, coughing, sneezing, or drinking out of the same glass. Usually, you get better in 1 or 2 weeks.  HOME CARE   Only take medicine as told by your doctor.  Use a warm mist humidifier or breathe in steam from a hot shower.  Drink enough water and fluids to keep your pee (urine) clear or pale yellow.  Get plenty of rest.  Return to work when your temperature is back to normal or as told by your doctor. You may use a face mask and wash your hands to stop your cold from spreading. GET HELP RIGHT AWAY IF:   After the first few days, you feel you are getting worse.  You have questions about your medicine.  You have chills, shortness of breath, or brown or red spit (mucus).  You have yellow or brown snot (nasal discharge) or pain in the face, especially when you bend forward.  You have a fever, puffy (swollen) neck, pain when you swallow, or white spots in the back of your throat.  You have a bad headache, ear pain, sinus pain, or chest pain.  You have a high-pitched whistling sound when you breathe in and out (wheezing).  You have a lasting cough or cough up blood.  You have sore muscles or a stiff neck. MAKE SURE YOU:   Understand these instructions.  Will watch your condition.  Will get help right away  if you are not doing well or get worse. Document Released: 08/16/2007 Document Revised: 05/22/2011 Document Reviewed: 07/04/2010 [facility] Patient Information 2014 [place], [place].

## 2013-03-26 ENCOUNTER — Other Ambulatory Visit: Payer: Self-pay

## 2013-03-26 MED ORDER — CLONAZEPAM 1 MG PO TABS: 1.0000 mg | ORAL_TABLET | Freq: Every evening | ORAL | Status: DC | PRN

## 2013-04-03 ENCOUNTER — Telehealth: Payer: Self-pay | Admitting: Internal Medicine

## 2013-04-03 NOTE — Telephone Encounter (Signed)
Pt request Dr.norins to call or email her @ [email or web address]. Pt stated she might need referral to vascular specialist or the cancer doctor, pt thinks she might have [place] or macroglobulinemia. Pt had a consult with Dr. Florene Glen (he did her back surgery) and he thinks that she need to get these symptoms check out: feet and hand turn blue, pain and weak. Last ov was 09/2012 with Dr. Linda Hedges, pt has an appt on 04/08/13. Please advise

## 2013-04-03 NOTE — Telephone Encounter (Signed)
A discussion of the problem mentioned, [place] macroglobulinema or other vascular problems can wait until her 1/27 office visit.

## 2013-04-07 NOTE — Telephone Encounter (Signed)
Pt is aware, will be here 1.27.15.

## 2013-04-08 ENCOUNTER — Ambulatory Visit (INDEPENDENT_AMBULATORY_CARE_PROVIDER_SITE_OTHER): Payer: BC Managed Care – PPO | Admitting: Internal Medicine

## 2013-04-08 ENCOUNTER — Encounter: Payer: Self-pay | Admitting: Internal Medicine

## 2013-04-08 VITALS — BP 144/88 | HR 83 | Temp 97.1°F | Wt 191.4 lb

## 2013-04-08 DIAGNOSIS — G609 Hereditary and idiopathic neuropathy, unspecified: Secondary | ICD-10-CM

## 2013-04-08 DIAGNOSIS — R230 Cyanosis: Secondary | ICD-10-CM

## 2013-04-08 DIAGNOSIS — G629 Polyneuropathy, unspecified: Secondary | ICD-10-CM

## 2013-04-08 MED ORDER — HYDROCHLOROTHIAZIDE 25 MG PO TABS: 25.0000 mg | ORAL_TABLET | Freq: Every day | ORAL | Status: DC

## 2013-04-08 MED ORDER — CLONAZEPAM 1 MG PO TABS: 1.0000 mg | ORAL_TABLET | Freq: Every evening | ORAL | Status: DC | PRN

## 2013-04-08 MED ORDER — HYDROCODONE-ACETAMINOPHEN 5-325 MG PO TABS: ORAL_TABLET | ORAL | Status: DC

## 2013-04-08 MED ORDER — AMITRIPTYLINE HCL 10 MG PO TABS: 40.0000 mg | ORAL_TABLET | Freq: Every day | ORAL | Status: DC

## 2013-04-08 NOTE — Progress Notes (Signed)
Pre visit review using our clinic review tool, if applicable. No additional management support is needed unless otherwise documented below in the visit note. 

## 2013-04-08 NOTE — Patient Instructions (Signed)
Array of symptoms that do not suggest a specific diagnosis. If this is Waldenstom's or some variant thereof the best course of action is to consult with a hematolgist/oncologist. For a persistent nodule with a concern for any type of malignancy, despite a CT soft-tissue neck with and without contrast, an oncologist is the next expert to consult. For small vessel circulation problems, with an easily palpable foot artery, thus making a vascular problem highly unlikely, and a rheumatology consult that feels it is not Raynaud's of an autoimmune origin the exert to consult is a hematologist/oncologist.  Plan - Referral to the [facility] - staff will contact you with information about an appointment.

## 2013-04-11 ENCOUNTER — Telehealth: Payer: Self-pay | Admitting: Hematology and Oncology

## 2013-04-11 NOTE — Telephone Encounter (Signed)
PATIENT NEW APPT SCHEDULED 02/10 @ 9:45 W/DR. [place], PERIPHERAL NEUROPATHY WELCOME PACKET MAILED.

## 2013-04-11 NOTE — Telephone Encounter (Signed)
C/D 04/11/13 for appt. 04/22/13

## 2013-04-11 NOTE — Progress Notes (Signed)
Subjective:    Patient ID: [name], female    DOB: [birth year], 63 y.o.   MRN: [id number]  HPI [name] presents because she has decided she may have Waldenstrom macroglobulemia. She also is concerned that she has a persistent right submandibular lymph node. She has had a CT scan and consultation with Dr. Redmond Baseman. He did not feel that there was no indication for biopsy - no lesion of concern. She is also concerned about peripheral vascular disease : she is concerned for Raynauds (of malignant origin)- acral cyanosis - persistent discoloration go the hands and feet.   Past Medical History  Diagnosis Date  . Hypertension   . Migraine headache     hormonal related - less frequent off hormone replacement  . Synovial cyst of lumbar facet joint     lower back - s/p surgery   . Derangement of knee, left 2009    started with fall at work. Has had repair patella and torn meniscus  . Migraines   . Hx of appendectomy   . High cholesterol   . Allergy   . Arthritis    Past Surgical History  Procedure Laterality Date  . Ovarian cyst surgery  1972  . Appendectomy  1971  . Cervical discectomy  1999    diskectomy with fixation:plate and screws  . Synovial cyst excision  2007    lumbar spine  . Patellar reefing  '09    repair of fractured patella after fall  . Knee arthroscopy w/ meniscal repair  09    left knee  . Venous ablation      for pain in the groin - [facility] did procedure. Has some residual discomfort at the  ablation site.   . L4-5 lumbar fusion  September 02, 2010    Dr. Hal Neer:  minimally invasive transforaminal interbody fusion with spacer  . Spine surgery     Family History  Problem Relation Age of Onset  . Diabetes Mother   . COPD Mother   . Aortic aneurysm Mother   . Hypertension Mother   . Cancer Father     lung  . Hyperlipidemia Father   . Hypertension Father   . Heart disease Father   . Diabetes Father   . Diabetes Brother   . Hypothyroidism Mother     History   Social History  . Marital Status: Widowed    Spouse Name: N/A    Number of Children: N/A  . Years of Education: 14   Occupational History  . 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    Social History Main Topics  . Smoking status: Never Smoker   . Smokeless tobacco: Never Used  . Alcohol Use: No  . Drug Use: No  . Sexual Activity: Yes    Partners: Male    Birth Control/ Protection: None   Other Topics Concern  . 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 1 dtr- '72; 1 son '73; 4 grandchildren, 2 step-grands. 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     Current Outpatient Prescriptions on File Prior to Visit  Medication Sig Dispense Refill  . Ascorbic Acid (VITAMIN C) 500 MG CAPS Take 500 mg by mouth every other day.       Marland Kitchen aspirin EC 81 MG tablet Take 81 mg by mouth daily.      . B Complex Vitamins (VITAMIN  B COMPLEX PO) Take 1 tablet by mouth daily.       . Beclomethasone Diprop Monohyd (VANCENASE AQ DOUBLE STRENGTH NA) Place 1 spray into the nose 2 (two) times daily as needed (for allergies).      . cyclobenzaprine (FLEXERIL) 10 MG tablet Take 10 mg by mouth 2 (two) times daily as needed for muscle spasms.       Marland Kitchen dextromethorphan-guaiFENesin (MUCINEX DM) 30-600 MG per 12 hr tablet Take 1 tablet by mouth every 12 (twelve) hours as needed.      . famotidine (PEPCID) 20 MG tablet Take 20 mg by mouth daily.      Marland Kitchen ipratropium (ATROVENT) 0.03 % nasal spray Place 2 sprays into the nose 4 (four) times daily.  30 mL  1  . magnesium gluconate (MAGONATE) 500 MG tablet Take 500 mg by mouth every other day.      . Multiple Vitamin (MULTIVITAMIN WITH MINERALS) TABS Take 1 tablet by mouth daily.      . naproxen sodium (ANAPROX) 220 MG tablet Take 440 mg by mouth 2 (two) times daily as needed (pain).      Marland Kitchen NEXIUM 40 MG capsule TAKE 1 CAPSULE BY  MOUTH DAILY BEFORE BREAKFAST.  30 capsule  6  . pneumococcal 13-valent conjugate vaccine (PREVNAR 13) SUSP injection Inject 0.5 mLs into the muscle once.  0.5 mL  0  . potassium gluconate (TH POTASSIUM GLUCONATE) 595 MG TABS Take 595 mg by mouth every other day.      Marland Kitchen PRESCRIPTION MEDICATION Take 1 tablet by mouth 2 (two) times daily as needed (pain). Hydrocodone product from [facility]      . vitamin E 400 UNIT capsule Take 400 Units by mouth every other day.        No current facility-administered medications on file prior to visit.        Review of Systems System review is negative for any constitutional, cardiac, pulmonary, GI or neuro symptoms or complaints other than as described in the HPI.     Objective:   Physical Exam Vitals Wt Readings from Last 3 Encounters:  04/08/13 191 lb 6.4 oz (86.818 kg)  03/09/13 191 lb (86.637 kg)  12/22/12 196 lb (88.905 kg)   Gen'l- heavy set woman in no acute distress HEENT- C&S clear Cor 2+ radial pulse, 2+ DP pulse easily palpated. Mild acral cyanosis feet more than hands, slightly slow capillary refill. RRR. Pulm - normal respirations. Neuro - Alert and oriented. Speech clear, cognition appears normal. Normal strength - GU&G, normal gait.        Assessment & Plan:  Acral cyanosis; paresthesia; weakness - a complex set of symptoms w/o specific diagnosis. Spent > 40 minutes discussing symptoms and reviewing with her various disease entities, i.e. Waldenstrom's, Raynauds, weakness. Reviewed consult notes from Drs. Ouida Sills and Moodus with her and her husband. Reviewed CT scan reports with her and her husband.   She is casting about for a unifying diagnosis for a set of non-critical symptoms. We discussed various medical sub-specialties which may be appropriate referral resources. She has been seen by rheumatology in the past - Dr. Ouida Sills, who felt she did not have true Raynaud's or any other immunologic disease. Dr. Redmond Baseman found no  evidence of a suspicious submandibular or cervical lymph node on exam or CT scan. Her biggest concern is hematologic and wants several exotic serum assays done which she has looked up on the internet.  Plan Hematology consult in regard to her  concern for macroglobulinemia or other hematologic disease.  ROV soon to complete physical exam, in particular she is worried about hepato-splenomegaly.

## 2013-04-22 ENCOUNTER — Ambulatory Visit: Payer: BC Managed Care – PPO | Admitting: Hematology and Oncology

## 2013-04-22 ENCOUNTER — Ambulatory Visit: Payer: BC Managed Care – PPO

## 2013-04-23 ENCOUNTER — Ambulatory Visit (HOSPITAL_BASED_OUTPATIENT_CLINIC_OR_DEPARTMENT_OTHER): Payer: BC Managed Care – PPO | Admitting: Hematology and Oncology

## 2013-04-23 ENCOUNTER — Encounter: Payer: Self-pay | Admitting: Hematology and Oncology

## 2013-04-23 ENCOUNTER — Telehealth: Payer: Self-pay | Admitting: Hematology and Oncology

## 2013-04-23 ENCOUNTER — Ambulatory Visit: Payer: BC Managed Care – PPO

## 2013-04-23 VITALS — BP 152/74 | HR 89 | Temp 97.8°F | Resp 20 | Ht 65.0 in | Wt 193.4 lb

## 2013-04-23 DIAGNOSIS — M2190 Unspecified acquired deformity of unspecified limb: Secondary | ICD-10-CM

## 2013-04-23 DIAGNOSIS — M959 Acquired deformity of musculoskeletal system, unspecified: Secondary | ICD-10-CM

## 2013-04-23 DIAGNOSIS — L409 Psoriasis, unspecified: Secondary | ICD-10-CM | POA: Insufficient documentation

## 2013-04-23 DIAGNOSIS — M4800 Spinal stenosis, site unspecified: Secondary | ICD-10-CM

## 2013-04-23 DIAGNOSIS — R230 Cyanosis: Secondary | ICD-10-CM

## 2013-04-23 DIAGNOSIS — G589 Mononeuropathy, unspecified: Secondary | ICD-10-CM

## 2013-04-23 DIAGNOSIS — L408 Other psoriasis: Secondary | ICD-10-CM

## 2013-04-23 DIAGNOSIS — G629 Polyneuropathy, unspecified: Secondary | ICD-10-CM

## 2013-04-23 DIAGNOSIS — IMO0002 Reserved for concepts with insufficient information to code with codable children: Secondary | ICD-10-CM

## 2013-04-23 HISTORY — DX: Unspecified acquired deformity of unspecified limb: M21.90

## 2013-04-23 HISTORY — DX: Polyneuropathy, unspecified: G62.9

## 2013-04-23 HISTORY — DX: Psoriasis, unspecified: L40.9

## 2013-04-23 HISTORY — DX: Reserved for concepts with insufficient information to code with codable children: IMO0002

## 2013-04-23 NOTE — Telephone Encounter (Signed)
gv pt information for Dr. Russo..the patient will sched appt °

## 2013-04-23 NOTE — Telephone Encounter (Signed)
gv pt information for Dr. Virgina Jock..the patient will sched appt

## 2013-04-24 NOTE — Progress Notes (Signed)
[facility] NOTE  Patient Care Team: Neena Rhymes, MD as PCP - General (Internal Medicine) Logan Bores, MD (Obstetrics and Gynecology) Lorn Junes, MD (Orthopedic Surgery) Hosie Spangle, MD (Neurosurgery)  CHIEF COMPLAINTS/PURPOSE OF CONSULTATION:  Concerned she may have Waldenstrom macroglobulinemia  HISTORY OF PRESENTING ILLNESS:  [name] 63 y.o. female is here because of multiple concerns. The patient actually show up today even though her appointment was the day before.  She produce a list of 27 symptoms which will be scanned into the system The patient has done extensive research related to her symptoms and felt that she fulfill the criteria of Waldenstrom macroglobulinemia Some of the symptoms including slow mentation, unusual bleeding and bruising, probable lymphadenopathy in the neck, significant deformed looking fingers and toes,Raynaud's phenomenon in her hands and feet, severe neuropathy, severe hypertension, loss of appetite, profound weakness throughout her body, etc The patient expressed significant frustration because she has been seen by multiple providers and none of them can come up with a unified diagnosis of the problems that she is experiencing.  MEDICAL HISTORY:  Past Medical History  Diagnosis Date  . Hypertension   . Migraine headache     hormonal related - less frequent off hormone replacement  . Synovial cyst of lumbar facet joint     lower back - s/p surgery   . Derangement of knee, left 2009    started with fall at work. Has had repair patella and torn meniscus  . Migraines   . Hx of appendectomy   . High cholesterol   . Allergy   . Arthritis   . Neuropathy 04/23/2013  . Psoriasis 04/23/2013  . Deformity of joint 04/23/2013    SURGICAL HISTORY: Past Surgical History  Procedure Laterality Date  . Ovarian cyst surgery  1972  . Appendectomy  1971  . Cervical discectomy  1999    diskectomy with  fixation:plate and screws  . Synovial cyst excision  2007    lumbar spine  . Patellar reefing  '09    repair of fractured patella after fall  . Knee arthroscopy w/ meniscal repair  09    left knee  . Venous ablation      for pain in the groin - [facility]TS did procedure. Has some residual discomfort at the  ablation site.   . L4-5 lumbar fusion  September 02, 2010    Dr. Hal Neer:  minimally invasive transforaminal interbody fusion with spacer  . Spine surgery      SOCIAL HISTORY: History   Social History  . Marital Status: Widowed    Spouse Name: N/A    Number of Children: N/A  . Years of Education: 14   Occupational History  . 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    Social History Main Topics  . Smoking status: Never Smoker   . Smokeless tobacco: Never Used  . Alcohol Use: No  . Drug Use: No  . Sexual Activity: Yes    Partners: Male    Birth Control/ Protection: None   Other Topics Concern  . 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 1 dtr- '72; 1 son '73; 4 grandchildren, 2 step-grands. 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     FAMILY HISTORY: Family History  Problem Relation Age of Onset  . Diabetes Mother   . COPD Mother   .  Aortic aneurysm Mother   . Hypertension Mother   . Cancer Father     lung  . Hyperlipidemia Father   . Hypertension Father   . Heart disease Father   . Diabetes Father   . Diabetes Brother   . Hypothyroidism Mother     ALLERGIES:  is allergic to caine-1; celebrex; codeine; epinephrine; other; procaine hcl; and sulfa antibiotics.  MEDICATIONS:  Current Outpatient Prescriptions  Medication Sig Dispense Refill  . amitriptyline (ELAVIL) 10 MG tablet Take 4 tablets (40 mg total) by mouth at bedtime.  120 tablet  0  . Ascorbic Acid (VITAMIN C) 500 MG CAPS Take 500 mg by mouth every other day.       Marland Kitchen aspirin EC  81 MG tablet Take 81 mg by mouth daily.      . B Complex Vitamins (VITAMIN B COMPLEX PO) Take 1 tablet by mouth daily.       . Beclomethasone Diprop Monohyd (VANCENASE AQ DOUBLE STRENGTH NA) Place 1 spray into the nose 2 (two) times daily as needed (for allergies).      . clonazePAM (KLONOPIN) 1 MG tablet Take 1 tablet (1 mg total) by mouth at bedtime as needed for anxiety.  30 tablet  0  . cyclobenzaprine (FLEXERIL) 10 MG tablet Take 10 mg by mouth 2 (two) times daily as needed for muscle spasms.       Marland Kitchen dextromethorphan-guaiFENesin (MUCINEX DM) 30-600 MG per 12 hr tablet Take 1 tablet by mouth every 12 (twelve) hours as needed.      . famotidine (PEPCID) 20 MG tablet Take 20 mg by mouth daily.      . hydrochlorothiazide (HYDRODIURIL) 25 MG tablet Take 1 tablet (25 mg total) by mouth daily.  30 tablet  11  . HYDROcodone-acetaminophen (NORCO/VICODIN) 5-325 MG per tablet TAKE 1-2 TABLETS EVERY 4-6 HOURS AS NEED FOR PAIN. MAY FILL 05/09/13  120 tablet  0  . hydrocortisone cream 1 % Apply 1 application topically as needed for itching.      Marland Kitchen ipratropium (ATROVENT) 0.03 % nasal spray Place 2 sprays into the nose 4 (four) times daily.  30 mL  1  . magnesium gluconate (MAGONATE) 500 MG tablet Take 500 mg by mouth every other day.      . Multiple Vitamin (MULTIVITAMIN WITH MINERALS) TABS Take 1 tablet by mouth daily.      . naproxen sodium (ANAPROX) 220 MG tablet Take 440 mg by mouth 2 (two) times daily as needed (pain).      Marland Kitchen NEXIUM 40 MG capsule TAKE 1 CAPSULE BY MOUTH DAILY BEFORE BREAKFAST.  30 capsule  6  . potassium gluconate (TH POTASSIUM GLUCONATE) 595 MG TABS Take 595 mg by mouth every other day.      Marland Kitchen PRESCRIPTION MEDICATION Take 1 tablet by mouth 2 (two) times daily as needed (pain). Hydrocodone product from [facility]      . vitamin E 400 UNIT capsule Take 400 Units by mouth every other day.        No current facility-administered medications for this visit.    REVIEW OF SYSTEMS:   Please see attached list. All other systems were reviewed with the patient and are negative.  PHYSICAL EXAMINATION: ECOG PERFORMANCE STATUS: 1 - Symptomatic but completely ambulatory  Filed Vitals:   04/23/13 0957  BP: 152/74  Pulse: 89  Temp: 97.8 F (36.6 C)  Resp: 20   Filed Weights   04/23/13 0957  Weight: 193 lb 6.4 oz (87.726  kg)    GENERAL:alert, no distress and comfortable. She is mildly obese SKIN: skin color, texture, turgor are normal, no rashes or significant lesions EYES: normal, conjunctiva are pink and non-injected, sclera clear OROPHARYNX:no exudate, no erythema and lips, buccal mucosa, and tongue normal  NECK: supple, thyroid normal size, non-tender, without nodularity. The areas of concern on the posterior part of the neck is likely a dermoid cyst LYMPH:  no palpable lymphadenopathy in the cervical, axillary or inguinal LUNGS: clear to auscultation and percussion with normal breathing effort HEART: regular rate & rhythm and no murmurs and no lower extremity edema ABDOMEN:abdomen soft, non-tender and normal bowel sounds. No palpable splenomegaly Musculoskeletal:no cyanosis of digits and no clubbing  PSYCH: alert & oriented x 3 with fluent speech. She appeared to hold at times NEURO: no focal motor/sensory deficits  LABORATORY DATA:  I have reviewed the data as listed Lab Results  Component Value Date   WBC 7.6 07/22/2012   HGB 14.4 07/22/2012   HCT 45.3 07/22/2012   MCV 92.7 07/22/2012   PLT 278 06/11/2012   Lab Results  Component Value Date   NA 140 07/22/2012   K 4.2 07/22/2012   CL 102 07/22/2012   CO2 29 07/22/2012    RADIOGRAPHIC STUDIES: I reviewed her CT scan of the neck from last year which showed no evidence of abnormalities I have personally reviewed the radiological images as listed and agreed with the findings in the report.  ASSESSMENT & PLAN:  #1 Concern for Waldenstrom Macroglobulinemia Her last blood work were within normal limits. I told  the patient I could order the test to rule out the disease just to alleviate her anxiety but it is not going to explain all the other symptoms that she has #2 multiple other concerns The patient is very tearful and frustrated. She came in with high expectation that I could order as many tests as I would need to explain all the symptoms that she has been suffering, with solution and treatment for her problems. I tried to redirect her focus many times and told her that I am a hematologist. She was referred to see me because of concern for Waldenstrom. All I could do is to focus on my own subspecialty. I recommend her to consider second opinion elsewhere but the patient stated she has limited financial resources. I found out that she sees multiple specialists at [facility] for other medical problems and I recommend her to consider seeing other specialists at [facility]. Ultimately, the patient requested a second opinion from an internal medicine specialist and I ordered a request for that. Today, at the conclusion of visit, I did not order any additional workup as I do not feel she has Waldenstrom Macroglobulinemia.  Orders Placed This Encounter  Procedures  . Ambulatory referral to Internal Medicine    Referral Priority:  Routine    Referral Type:  Consultation    Referral Reason:  Specialty Services Required    Requested Specialty:  Internal Medicine    Number of Visits Requested:  1    All questions were answered. The patient knows to call the clinic with any problems, questions or concerns. I spent 50 minutes counseling the patient face to face. The total time spent in the appointment was 60 minutes and more than 50% was on counseling.     [place], [place], MD 04/24/2013 8:31 AM

## 2013-04-28 ENCOUNTER — Other Ambulatory Visit: Payer: Self-pay

## 2013-04-29 MED ORDER — AMITRIPTYLINE HCL 10 MG PO TABS: 40.0000 mg | ORAL_TABLET | Freq: Every day | ORAL | Status: DC

## 2013-04-29 MED ORDER — CLONAZEPAM 1 MG PO TABS: 1.0000 mg | ORAL_TABLET | Freq: Every evening | ORAL | Status: AC | PRN

## 2013-05-03 ENCOUNTER — Encounter (HOSPITAL_COMMUNITY): Payer: Self-pay | Admitting: Emergency Medicine

## 2013-05-03 ENCOUNTER — Emergency Department (HOSPITAL_COMMUNITY)
Admission: EM | Admit: 2013-05-03 | Discharge: 2013-05-03 | Disposition: A | Discharge status: Home / Self Care | Payer: BC Managed Care – PPO | Attending: Emergency Medicine | Admitting: Emergency Medicine

## 2013-05-03 DIAGNOSIS — Z862 Personal history of diseases of the blood and blood-forming organs and certain disorders involving the immune mechanism: Secondary | ICD-10-CM | POA: Insufficient documentation

## 2013-05-03 DIAGNOSIS — Z9889 Other specified postprocedural states: Secondary | ICD-10-CM | POA: Insufficient documentation

## 2013-05-03 DIAGNOSIS — IMO0002 Reserved for concepts with insufficient information to code with codable children: Secondary | ICD-10-CM | POA: Insufficient documentation

## 2013-05-03 DIAGNOSIS — M129 Arthropathy, unspecified: Secondary | ICD-10-CM | POA: Insufficient documentation

## 2013-05-03 DIAGNOSIS — Z8669 Personal history of other diseases of the nervous system and sense organs: Secondary | ICD-10-CM | POA: Insufficient documentation

## 2013-05-03 DIAGNOSIS — Z872 Personal history of diseases of the skin and subcutaneous tissue: Secondary | ICD-10-CM | POA: Insufficient documentation

## 2013-05-03 DIAGNOSIS — I10 Essential (primary) hypertension: Secondary | ICD-10-CM | POA: Insufficient documentation

## 2013-05-03 DIAGNOSIS — G43909 Migraine, unspecified, not intractable, without status migrainosus: Secondary | ICD-10-CM | POA: Insufficient documentation

## 2013-05-03 DIAGNOSIS — M25469 Effusion, unspecified knee: Secondary | ICD-10-CM | POA: Insufficient documentation

## 2013-05-03 DIAGNOSIS — Z8639 Personal history of other endocrine, nutritional and metabolic disease: Secondary | ICD-10-CM | POA: Insufficient documentation

## 2013-05-03 DIAGNOSIS — Z79899 Other long term (current) drug therapy: Secondary | ICD-10-CM | POA: Insufficient documentation

## 2013-05-03 NOTE — ED Provider Notes (Signed)
CSN: [id number]     Arrival date & time 05/03/13  1041 History   First MD Initiated Contact with Patient 05/03/13 1049     This chart was scribed for [name], a non-physician practitioner working with Merryl Hacker, MD by Denice Bors, ED Scribe. This patient was seen in room TR06C/TR06C and the patient's care was started at 10:57 AM   Chief Complaint  Patient presents with  . Knee Pain    (Consider location/radiation/quality/duration/timing/severity/associated sxs/prior Treatment) The history is provided by the patient. No language interpreter was used.   HPI Comments: [name] is a 63 y.o. female who presents to the Emergency Department complaining of constant moderate left knee pain onset 3 days. States she was painting a door for past 3 days, but no other strenuous activities. Reports associated swelling. No redness. Denies any aggravating or alleviating factors. Denies associated fever, recent trauma, and change in gait. Reports PMHx of arthritis and two left knee surgery for torn meniscus from fall in 2009.   Past Medical History  Diagnosis Date  . Hypertension   . Migraine headache     hormonal related - less frequent off hormone replacement  . Synovial cyst of lumbar facet joint     lower back - s/p surgery   . Derangement of knee, left 2009    started with fall at work. Has had repair patella and torn meniscus  . Migraines   . Hx of appendectomy   . High cholesterol   . Allergy   . Arthritis   . Neuropathy 04/23/2013  . Psoriasis 04/23/2013  . Deformity of joint 04/23/2013   Past Surgical History  Procedure Laterality Date  . Ovarian cyst surgery  1972  . Appendectomy  1971  . Cervical discectomy  1999    diskectomy with fixation:plate and screws  . Synovial cyst excision  2007    lumbar spine  . Patellar reefing  '09    repair of fractured patella after fall  . Knee arthroscopy w/ meniscal repair  09    left knee  . Venous ablation       for pain in the groin - [facility] did procedure. Has some residual discomfort at the  ablation site.   . L4-5 lumbar fusion  September 02, 2010    Dr. Hal Neer:  minimally invasive transforaminal interbody fusion with spacer  . Spine surgery     Family History  Problem Relation Age of Onset  . Diabetes Mother   . COPD Mother   . Aortic aneurysm Mother   . Hypertension Mother   . Cancer Father     lung  . Hyperlipidemia Father   . Hypertension Father   . Heart disease Father   . Diabetes Father   . Diabetes Brother   . Hypothyroidism Mother    History  Substance Use Topics  . Smoking status: Never Smoker   . Smokeless tobacco: Never Used  . Alcohol Use: No   OB History   Grav Para Term Preterm Abortions TAB SAB Ect Mult Living                 Review of Systems  Constitutional: Negative for fever and activity change.  Musculoskeletal: Positive for arthralgias and joint swelling. Negative for back pain and neck pain.  Skin: Negative for wound.  Neurological: Negative for weakness and numbness.  Psychiatric/Behavioral: Negative for confusion.      Allergies  Caine-1; Celebrex; Codeine; Epinephrine; Other; Procaine hcl; and  Sulfa antibiotics  Home Medications   Current Outpatient Rx  Name  Route  Sig  Dispense  Refill  . amitriptyline (ELAVIL) 10 MG tablet   Oral   Take 4 tablets (40 mg total) by mouth at bedtime.   120 tablet   0   . Ascorbic Acid (VITAMIN C) 500 MG CAPS   Oral   Take 500 mg by mouth every other day.          Marland Kitchen aspirin EC 81 MG tablet   Oral   Take 81 mg by mouth daily.         . B Complex Vitamins (VITAMIN B COMPLEX PO)   Oral   Take 1 tablet by mouth daily.          . Beclomethasone Diprop Monohyd (VANCENASE AQ DOUBLE STRENGTH NA)   Nasal   Place 1 spray into the nose 2 (two) times daily as needed (for allergies).         . clonazePAM (KLONOPIN) 1 MG tablet   Oral   Take 1 tablet (1 mg total) by mouth at bedtime as needed for  anxiety.   30 tablet   0   . cyclobenzaprine (FLEXERIL) 10 MG tablet   Oral   Take 10 mg by mouth 2 (two) times daily as needed for muscle spasms.          Marland Kitchen dextromethorphan-guaiFENesin (MUCINEX DM) 30-600 MG per 12 hr tablet   Oral   Take 1 tablet by mouth every 12 (twelve) hours as needed.         . famotidine (PEPCID) 20 MG tablet   Oral   Take 20 mg by mouth daily.         . hydrochlorothiazide (HYDRODIURIL) 25 MG tablet   Oral   Take 1 tablet (25 mg total) by mouth daily.   30 tablet   11   . HYDROcodone-acetaminophen (NORCO/VICODIN) 5-325 MG per tablet      TAKE 1-2 TABLETS EVERY 4-6 HOURS AS NEED FOR PAIN. MAY FILL 05/09/13   120 tablet   0   . hydrocortisone cream 1 %   Topical   Apply 1 application topically as needed for itching.         Marland Kitchen ipratropium (ATROVENT) 0.03 % nasal spray   Nasal   Place 2 sprays into the nose 4 (four) times daily.   30 mL   1   . magnesium gluconate (MAGONATE) 500 MG tablet   Oral   Take 500 mg by mouth every other day.         . Multiple Vitamin (MULTIVITAMIN WITH MINERALS) TABS   Oral   Take 1 tablet by mouth daily.         . naproxen sodium (ANAPROX) 220 MG tablet   Oral   Take 440 mg by mouth 2 (two) times daily as needed (pain).         Marland Kitchen NEXIUM 40 MG capsule      TAKE 1 CAPSULE BY MOUTH DAILY BEFORE BREAKFAST.   30 capsule   6   . potassium gluconate (TH POTASSIUM GLUCONATE) 595 MG TABS   Oral   Take 595 mg by mouth every other day.         Marland Kitchen PRESCRIPTION MEDICATION   Oral   Take 1 tablet by mouth 2 (two) times daily as needed (pain). Hydrocodone product from [facility]         . vitamin E 400 UNIT capsule  Oral   Take 400 Units by mouth every other day.           There were no vitals taken for this visit. Physical Exam  Nursing note and vitals reviewed. Constitutional: She is oriented to person, place, and time. She appears well-developed and well-nourished. No distress.   HENT:  Head: Normocephalic and atraumatic.  Eyes: EOM are normal. Pupils are equal, round, and reactive to light.  Neck: Normal range of motion. Neck supple. No tracheal deviation present.  Cardiovascular: Normal rate.  Exam reveals no decreased pulses.   Pulses:      Dorsalis pedis pulses are 2+ on the right side, and 2+ on the left side.       Posterior tibial pulses are 2+ on the right side, and 2+ on the left side.  Pulmonary/Chest: Effort normal. No respiratory distress.  Musculoskeletal: Normal range of motion. She exhibits tenderness. She exhibits no edema.       Left hip: Normal.       Left knee: She exhibits effusion. Tenderness (generally) found.       Left ankle: Normal. No tenderness.  Neurological: She is alert and oriented to person, place, and time. No sensory deficit.  Motor, sensation, and vascular distal to the injury is fully intact.   Skin: Skin is warm and dry.  Psychiatric: She has a normal mood and affect. Her behavior is normal.    ED Course  Procedures COORDINATION OF CARE:  Nursing notes reviewed. Vital signs reviewed. Initial pt interview and examination performed.   10:54 AM-Discussed treatment plan with pt at bedside. Pt agrees with plan.   Treatment plan initiated:Medications - No data to display   Initial diagnostic testing ordered.     Labs Review Labs Reviewed - No data to display Imaging Review No results found.  EKG Interpretation   None      Vital signs reviewed and are as follows: Filed Vitals:   05/03/13 1052  BP: 152/86  Pulse: 90  Temp: 97.6 F (36.4 C)  Resp: 15   Pt to continue Aleve at home. Offered x-ray, she declines. She has ortho f/u.   She is to return with uncontrolled pain, redness of knee, fever, other concerns.   MDM   Final diagnoses:  Knee effusion   Pt with knee effusion, full ROM of joint. Doubt gout or infection. Most likely osteo + aggravating factor such as recent work on door/kneeling. NAIDS and  ortho f/u prn.   I personally performed the services described in this documentation, which was scribed in my presence. The recorded information has been reviewed and is accurate.    Carlisle Cater, [place] 05/04/13 [phone]

## 2013-05-03 NOTE — Discharge Instructions (Signed)
Please read and follow all provided instructions.  Your diagnoses today include:  1. Knee effusion    Tests performed today include:  Vital signs. See below for your results today.   Medications prescribed:   Naproxen - anti-inflammatory pain medication  Do not exceed 500mg  naproxen every 12 hours, take with food  You have been prescribed an anti-inflammatory medication or NSAID. Take with food. Take smallest effective dose for the shortest duration needed for your pain. Stop taking if you experience stomach pain or vomiting.   Take any prescribed medications only as directed.  Home care instructions:   Follow any educational materials contained in this packet  Follow R.I.C.E. Protocol:  R - rest your injury   I  - use ice on injury without applying directly to skin  C - compress injury with bandage or splint  E - elevate the injury as much as possible  Follow-up instructions: Please follow-up with your primary care provider or the provided orthopedic physician (bone specialist) if you continue to have significant pain or trouble walking in 1 week. In this case you may have a severe injury that requires further care.   If you do not have a primary care doctor -- see below for referral information.   Return instructions:   Please return if your toes are numb or tingling, appear gray or blue, or you have severe pain (also elevate leg and loosen splint or wrap if you were given one)  Please return to the Emergency Department if you experience worsening symptoms.   Please return if you have any other emergent concerns.  Additional Information:  Your vital signs today were: BP 152/86   Pulse 90   Temp(Src) 97.6 F (36.4 C) (Oral)   Resp 15   Ht 5\' 5"  (1.651 m)   Wt 175 lb (79.379 kg)   BMI 29.12 kg/m2   SpO2 100% If your blood pressure (BP) was elevated above 135/85 this visit, please have this repeated by your doctor within one month. --------------

## 2013-05-03 NOTE — ED Notes (Addendum)
Pt c/o L knee pain since Tuesday. States her knee looks swollen also. Denies any injuries. Pt had knee surgery in September on this knee. Pt also has had a rash to L side of neck for 1 month, she has a hx of psoriasis

## 2013-05-04 NOTE — ED Provider Notes (Signed)
Medical screening examination/treatment/procedure(s) were performed by non-physician practitioner and as supervising physician I was immediately available for consultation/collaboration.  EKG Interpretation   None        Merryl Hacker, MD 05/04/13 [phone]

## 2013-06-16 ENCOUNTER — Other Ambulatory Visit: Payer: Self-pay

## 2013-06-16 NOTE — Telephone Encounter (Signed)
Last seen with Dr Linda Hedges 03/2013  Okay for refill?

## 2013-06-17 ENCOUNTER — Telehealth: Payer: Self-pay | Admitting: *Deleted

## 2013-06-17 MED ORDER — AMITRIPTYLINE HCL 10 MG PO TABS: 40.0000 mg | ORAL_TABLET | Freq: Every day | ORAL | Status: DC

## 2013-06-17 NOTE — Telephone Encounter (Signed)
Message copied by Harl Bowie on Tue Jun 17, 2013 11:58 AM ------      Message from: Hendricks Limes      Created: Mon Jun 16, 2013  6:59 PM       Okay for amitriptyline be refilled x1 month. She needs to establish with another primary care doctor as soon as possible.      ----- Message -----         From: Rowe Clack, MD         Sent: 06/16/2013   6:41 PM           To: Hendricks Limes, MD            That is general policy (though i agree with your dose concerns)      Have the CMA double check that the pt has plans to establish with new PCP within next 90d to continue these meds....            ----- Message -----         From: Hendricks Limes, MD         Sent: 06/16/2013   6:20 PM           To: Rowe Clack, MD            I feel uncomfortable with dose; do we refill for set period until she establishes with new PCP ?              ------

## 2013-06-17 NOTE — Telephone Encounter (Signed)
Spoke with the pt and informed her of Dr. Clayborn Heron note below.  Pt understood and agreed.  Informed the pt she can call the office and see if any of the other doctor's are taking any new patients.  Pt agreed.//AB/CMA

## 2013-10-10 DIAGNOSIS — I872 Venous insufficiency (chronic) (peripheral): Secondary | ICD-10-CM | POA: Insufficient documentation

## 2013-10-10 HISTORY — DX: Venous insufficiency (chronic) (peripheral): I87.2

## 2013-12-29 ENCOUNTER — Other Ambulatory Visit (INDEPENDENT_AMBULATORY_CARE_PROVIDER_SITE_OTHER): Payer: Self-pay | Admitting: Surgery

## 2013-12-29 NOTE — H&P (Signed)
[place] 12/29/2013 11:06 AM Location: [facility] Patient #: [id number] DOB: Aug 11, 1950 Married / Language: Cleophus Molt / Race: White Female  History of Present Illness Adin Hector MD; 12/29/2013 12:30 PM) The patient is a 63 year old female who presents with a complaint of anal problems. Pleasant woman with mass and anus for several years. He can be bothersome and hard to keep the area clean. Is struggled with intermittent constipation. She requires narcotics for her chronic low back pain from minor many surgeries. Had a colonoscopy a few years ago. Large anal tag noted. It has become more symptomatic. She wished to consider removal. She wonders if she can be done in the office today or at least within the next month before her insurance deductable expires. Some constipation or narcotics. Known inflammatory bowel disease. Of Crohn's or ulcerative colitis. Some blood when she wipes but no severe hematochezia or melena. Intermittent abdominal cramping. Feels probiotic helps her.    Other Problems Davy Pique Bynum, CMA; 12/29/2013 11:11 AM) Arthritis Back Pain Gastroesophageal Reflux Disease High blood pressure Oophorectomy Left. Vascular Disease  Past Surgical History Marjean Donna, CMA; 12/29/2013 11:11 AM) Appendectomy Knee Surgery Left. Spinal Surgery - Lower Back Spinal Surgery - Neck  Diagnostic Studies History Marjean Donna, CMA; 12/29/2013 11:11 AM) Colonoscopy 1-5 years ago Mammogram 1-3 years ago  Allergies Davy Pique Bynum, CMA; 12/29/2013 11:10 AM) Celecoxib *ANALGESICS - ANTI-INFLAMMATORY* Codeine Phosphate *ANALGESICS - OPIOID* Procaine &Tetracaine-Dex *LOCAL ANESTHETICS-Parenteral* Lyrica *ANTICONVULSANTS* Epinephrine & Chlorpheniramine *VASOPRESSORS* SulfaSALAzine *GASTROINTESTINAL AGENTS - MISC.* Adhesive Tape *MEDICAL DEVICES* NSAIDs  Medication History (Sonya Bynum, CMA; 12/29/2013 11:08 AM) Nucynta (50MG   Tablet, Oral daily) Active. ClonazePAM (1MG  Tablet, Oral daily) Active. Lyrica (50MG  Capsule, Oral daily) Active. Amitriptyline HCl (10MG  Tablet, Oral daily) Active. Hydrochlorothiazide (25MG  Tablet, Oral daily) Active. NexIUM (40MG  Capsule DR, Oral daily) Active. Losartan Potassium (25MG  Tablet, Oral daily) Active. Acebutolol HCl (200MG  Capsule, Oral daily) Active. Hydrocodone-Acetaminophen (5-325MG  Tablet, Oral as needed) Active. Docusate Sodium (100MG  Capsule, Oral as needed) Active. Flexeril (10MG  Tablet, Oral daily) Active.  Social History Marjean Donna, CMA; 12/29/2013 11:11 AM) Caffeine use Coffee. No alcohol use No drug use Tobacco use Never smoker.  Family History Marjean Donna, [place]; 12/29/2013 11:11 AM) Arthritis Brother, Father, Mother. Diabetes Mellitus Mother. Heart Disease Brother, Father. Hypertension Father, Mother. Kidney Disease Brother. Migraine Headache Daughter. Respiratory Condition Father. Thyroid problems Mother.  Pregnancy / Birth History Marjean Donna, [place]; 12/29/2013 11:11 AM) Age at menarche 49 years. Gravida 2 Maternal age 3-20 Para 2  Review of Systems ([place]; 12/29/2013 11:11 AM) General Not Present- Appetite Loss, Chills, Fatigue, Fever, Night Sweats, Weight Gain and Weight Loss. Skin Not Present- Change in Wart/Mole, Dryness, Hives, Jaundice, New Lesions, Non-Healing Wounds, Rash and Ulcer. HEENT Not Present- Earache, Hearing Loss, Hoarseness, Nose Bleed, Oral Ulcers, Ringing in the Ears, Seasonal Allergies, Sinus Pain, Sore Throat, Visual Disturbances, Wears glasses/contact lenses and Yellow Eyes. Respiratory Not Present- Bloody sputum, Chronic Cough, Difficulty Breathing, Snoring and Wheezing. Cardiovascular Present- Rapid Heart Rate and Shortness of Breath. Not Present- Chest Pain, Difficulty Breathing Lying Down, Leg Cramps, Palpitations and Swelling of Extremities. Gastrointestinal Not Present- Abdominal  Pain, Bloating, Bloody Stool, Change in Bowel Habits, Chronic diarrhea, Constipation, Difficulty Swallowing, Excessive gas, Gets full quickly at meals, Hemorrhoids, Indigestion, Nausea, Rectal Pain and Vomiting. Female Genitourinary Not Present- Frequency, Nocturia, Painful Urination, Pelvic Pain and Urgency. Musculoskeletal Present- Back Pain and Muscle Weakness. Not Present- Joint Pain, Joint Stiffness, Muscle Pain and Swelling of Extremities. Neurological  Not Present- Decreased Memory, Fainting, Headaches, Numbness, Seizures, Tingling, Tremor, Trouble walking and Weakness. Psychiatric Not Present- Anxiety, Bipolar, Change in Sleep Pattern, Depression, Fearful and Frequent crying. Endocrine Present- Cold Intolerance. Not Present- Excessive Hunger, Hair Changes, Heat Intolerance, Hot flashes and New Diabetes. Hematology Present- Easy Bruising. Not Present- Excessive bleeding, Gland problems, HIV and Persistent Infections.   Vitals (Sonya Bynum CMA; 12/29/2013 11:14 AM) 12/29/2013 11:11 AM Weight: 195 lb Height: 65in Body Surface Area: 2.01 m Body Mass Index: 32.45 kg/m Pulse: 68 (Regular)  BP: 126/82 (Sitting, Left Arm, Standard)    Physical Exam Adin Hector MD; 12/29/2013 11:57 AM) General Mental Status-Alert. General Appearance-Not in acute distress, Not Sickly. Orientation-Oriented X3. Hydration-Well hydrated. Voice-Normal.  Integumentary Global Assessment Upon inspection and palpation of skin surfaces of the - Axillae: non-tender, no inflammation or ulceration, no drainage. and Distribution of scalp and body hair is normal. General Characteristics Temperature - normal warmth is noted.  Head and Neck Head-normocephalic, atraumatic with no lesions or palpable masses. Face Global Assessment - atraumatic, no absence of expression. Neck Global Assessment - no abnormal movements, no bruit auscultated on the right, no bruit auscultated on the left, no  decreased range of motion, non-tender. Trachea-midline. Thyroid Gland Characteristics - non-tender.  Eye Eyeball - Left-Extraocular movements intact, No Nystagmus. Eyeball - Right-Extraocular movements intact, No Nystagmus. Cornea - Left-No Hazy. Cornea - Right-No Hazy. Sclera/Conjunctiva - Left-No scleral icterus, No Discharge. Sclera/Conjunctiva - Right-No scleral icterus, No Discharge. Pupil - Left-Direct reaction to light normal. Pupil - Right-Direct reaction to light normal.  ENMT Ears Pinna - Left - no drainage observed, no generalized tenderness observed. Right - no drainage observed, no generalized tenderness observed. Nose and Sinuses External Inspection of the Nose - no destructive lesion observed. Inspection of the nares - Left - quiet respiration. Right - quiet respiration. Mouth and Throat Lips - Upper Lip - no fissures observed, no pallor noted. Lower Lip - no fissures observed, no pallor noted. Nasopharynx - no discharge present. Oral Cavity/Oropharynx - Tongue - no dryness observed. Oral Mucosa - no cyanosis observed. Hypopharynx - no evidence of airway distress observed.  Chest and Lung Exam Inspection Movements - Normal and Symmetrical. Accessory muscles - No use of accessory muscles in breathing. Palpation Palpation of the chest reveals - Non-tender. Auscultation Breath sounds - Normal and Clear.  Cardiovascular Auscultation Rhythm - Regular. Murmurs & Other Heart Sounds - Auscultation of the heart reveals - No Murmurs and No Systolic Clicks.  Abdomen Inspection Inspection of the abdomen reveals - No Visible peristalsis and No Abnormal pulsations. Umbilicus - No Bleeding, No Urine drainage. Palpation/Percussion Palpation and Percussion of the abdomen reveal - Soft, Non Tender, No Rebound tenderness, No Rigidity (guarding) and No Cutaneous hyperesthesia.  Female Genitourinary Sexual Maturity Tanner 5 - Adult hair pattern. Note: No  vaginal bleeding nor discharge   Rectal Anorectal Exam External - external hemorrhoids, no anal fissures, no anorectal fistula, no pilonidal cysts, no pilonidal sinuses. Note: Large 2cm right posterior exterior hemorrhoid.  Peripheral Vascular Upper Extremity Inspection - Left - No Cyanotic nailbeds, Not Ischemic. Right - No Cyanotic nailbeds, Not Ischemic.  Neurologic Neurologic evaluation reveals -normal attention span and ability to concentrate, able to name objects and repeat phrases. Appropriate fund of knowledge , normal sensation and normal coordination. Mental Status Affect - not angry, not paranoid. Cranial Nerves-Normal Bilaterally. Gait-Normal.  Neuropsychiatric Mental status exam performed with findings of-able to articulate well with normal speech/language, rate, volume and coherence, thought content  normal with ability to perform basic computations and apply abstract reasoning and no evidence of hallucinations, delusions, obsessions or homicidal/suicidal ideation.  Musculoskeletal Global Assessment Spine, Ribs and Pelvis - no instability, subluxation or laxity. Right Upper Extremity - no instability, subluxation or laxity. Note: Scars lumbar spine   Lymphatic Head & Neck  General Head & Neck Lymphatics: Bilateral - Description - No Localized lymphadenopathy. Axillary  General Axillary Region: Bilateral - Description - No Localized lymphadenopathy. Femoral & Inguinal  Generalized Femoral & Inguinal Lymphatics: Left - Description - No Localized lymphadenopathy. Right - Description - No Localized lymphadenopathy.    Assessment & Plan Adin Hector MD; 12/29/2013 12:00 PM) INFLAMED EXTERNAL HEMORRHOID (455.3  K64.8) Impression: Increasingly symptomatic hemorrhoid. I think she would benefit from surgery to excise this. I think it is too large to do in the office. Would recommend outpatient surgery to provide anorectal block and anesthesia for better  comfort / examination.  The anatomy & physiology of the anorectal region was discussed. The pathophysiology of hemorrhoids and differential diagnosis was discussed. Natural history risks without surgery was discussed. I stressed the importance of a bowel regimen to have daily soft bowel movements to minimize progression of disease. Interventions such as sclerotherapy & banding were discussed.  The patient's symptoms are not adequately controlled by medicines and other non-operative treatments. I feel the risks & problems of no surgery outweigh the operative risks; therefore, I recommended surgery to treat the hemorrhoids by ligation, pexy, and possible resection.  Risks such as bleeding, infection, urinary difficulties, need for further treatment, heart attack, death, and other risks were discussed. I noted a good likelihood this will help address the problem. Goals of post-operative recovery were discussed as well. Possibility that this will not correct all symptoms was explained. Post-operative pain, bleeding, constipation, and other problems after surgery were discussed. We will work to minimize complications. Educational handouts further explaining the pathology, treatment options, and bowel regimen were given as well. Questions were answered. The patient expresses understanding & wishes to proceed with surgery. Current Plans  Schedule for Surgery Pt Education - [facility] Hemorrhoids (Haydin Calandra) Pt Education - [facility] Good Bowel Health (Smokey Melott) Pt Education - [facility] Rectal Surgery HCI (Julyana Woolverton): discussed with patient and provided information. ANOSCOPY, DIAGNOSTIC [phone])

## 2014-03-29 ENCOUNTER — Emergency Department (HOSPITAL_COMMUNITY)
Admission: EM | Admit: 2014-03-29 | Discharge: 2014-03-29 | Disposition: A | Discharge status: Home / Self Care | Payer: 59 | Attending: Emergency Medicine | Admitting: Emergency Medicine

## 2014-03-29 ENCOUNTER — Encounter (HOSPITAL_COMMUNITY): Payer: Self-pay | Admitting: *Deleted

## 2014-03-29 DIAGNOSIS — Z7982 Long term (current) use of aspirin: Secondary | ICD-10-CM | POA: Insufficient documentation

## 2014-03-29 DIAGNOSIS — Z7951 Long term (current) use of inhaled steroids: Secondary | ICD-10-CM | POA: Insufficient documentation

## 2014-03-29 DIAGNOSIS — M1990 Unspecified osteoarthritis, unspecified site: Secondary | ICD-10-CM | POA: Insufficient documentation

## 2014-03-29 DIAGNOSIS — I10 Essential (primary) hypertension: Secondary | ICD-10-CM | POA: Insufficient documentation

## 2014-03-29 DIAGNOSIS — Z79899 Other long term (current) drug therapy: Secondary | ICD-10-CM | POA: Insufficient documentation

## 2014-03-29 DIAGNOSIS — G43909 Migraine, unspecified, not intractable, without status migrainosus: Secondary | ICD-10-CM | POA: Diagnosis not present

## 2014-03-29 DIAGNOSIS — Z8639 Personal history of other endocrine, nutritional and metabolic disease: Secondary | ICD-10-CM | POA: Diagnosis not present

## 2014-03-29 DIAGNOSIS — Z872 Personal history of diseases of the skin and subcutaneous tissue: Secondary | ICD-10-CM | POA: Insufficient documentation

## 2014-03-29 DIAGNOSIS — R040 Epistaxis: Secondary | ICD-10-CM | POA: Diagnosis present

## 2014-03-29 HISTORY — DX: Raynaud's syndrome without gangrene: I73.00

## 2014-03-29 HISTORY — DX: Supraventricular tachycardia, unspecified: I47.10

## 2014-03-29 HISTORY — DX: Supraventricular tachycardia: I47.1

## 2014-03-29 MED ORDER — OXYMETAZOLINE HCL 0.05 % NA SOLN
1.0000 | Freq: Once | NASAL | Status: AC
Start: 2014-03-29 — End: 2014-03-29
  Administered 2014-03-29: 1 via NASAL
  Filled 2014-03-29: qty 15

## 2014-03-29 NOTE — ED Notes (Signed)
Pt held pressures for 10 minutes over bridge of nose. Bleeding has stopped.

## 2014-03-29 NOTE — ED Provider Notes (Signed)
CSN: [id number]     Arrival date & time 03/29/14  1308 History   First MD Initiated Contact with Patient 03/29/14 1326     Chief Complaint  Patient presents with  . Epistaxis     (Consider location/radiation/quality/duration/timing/severity/associated sxs/prior Treatment) Patient is a 64 y.o. female presenting with nosebleeds.  Epistaxis Location:  L nare Severity:  Severe Duration:  1 hour Timing:  Constant Progression:  Unchanged Chronicity:  Recurrent (same thing happened last night, resolved with pressure.) Context: aspirin use   Context: not anticoagulants and not trauma   Relieved by:  Nothing Ineffective treatments:  Applying pressure Associated symptoms: blood in oropharynx   Associated symptoms: no fever     Past Medical History  Diagnosis Date  . Hypertension   . Migraine headache     hormonal related - less frequent off hormone replacement  . Synovial cyst of lumbar facet joint     lower back - s/p surgery   . Derangement of knee, left 2009    started with fall at work. Has had repair patella and torn meniscus  . Migraines   . Hx of appendectomy   . High cholesterol   . Allergy   . Arthritis   . Neuropathy 04/23/2013  . Psoriasis 04/23/2013  . Deformity of joint 04/23/2013  . SVT (supraventricular tachycardia)   . Raynaud disease    Past Surgical History  Procedure Laterality Date  . Ovarian cyst surgery  1972  . Appendectomy  1971  . Cervical discectomy  1999    diskectomy with fixation:plate and screws  . Synovial cyst excision  2007    lumbar spine  . Patellar reefing  '09    repair of fractured patella after fall  . Knee arthroscopy w/ meniscal repair  09    left knee  . Venous ablation      for pain in the groin - VVTS did procedure. Has some residual discomfort at the  ablation site.   . L4-5 lumbar fusion  September 02, 2010    Dr. Hal Neer:  minimally invasive transforaminal interbody fusion with spacer  . Spine surgery     Family History   Problem Relation Age of Onset  . Diabetes Mother   . COPD Mother   . Aortic aneurysm Mother   . Hypertension Mother   . Cancer Father     lung  . Hyperlipidemia Father   . Hypertension Father   . Heart disease Father   . Diabetes Father   . Diabetes Brother   . Hypothyroidism Mother    History  Substance Use Topics  . Smoking status: Never Smoker   . Smokeless tobacco: Never Used  . Alcohol Use: No   OB History    No data available     Review of Systems  Constitutional: Negative for fever.  HENT: Positive for nosebleeds.   All other systems reviewed and are negative.     Allergies  Caine-1; Celebrex; Codeine; Epinephrine; Other; Procaine hcl; and Sulfa antibiotics  Home Medications   Prior to Admission medications   Medication Sig Start Date End Date Taking? Authorizing Provider  amitriptyline (ELAVIL) 10 MG tablet Take 4 tablets (40 mg total) by mouth at bedtime.    Hendricks Limes, MD  Ascorbic Acid (VITAMIN C) 500 MG CAPS Take 500 mg by mouth every other day.     Historical Provider, MD  aspirin EC 81 MG tablet Take 81 mg by mouth daily.    Historical Provider, MD  B Complex Vitamins (VITAMIN B COMPLEX PO) Take 1 tablet by mouth daily.     Historical Provider, MD  Beclomethasone Diprop Monohyd (VANCENASE AQ DOUBLE STRENGTH NA) Place 1 spray into the nose 2 (two) times daily as needed (for allergies).    Historical Provider, MD  clonazePAM (KLONOPIN) 1 MG tablet Take 1 tablet (1 mg total) by mouth at bedtime as needed for anxiety.    Neena Rhymes, MD  cyclobenzaprine (FLEXERIL) 10 MG tablet Take 10 mg by mouth 2 (two) times daily as needed for muscle spasms.     Historical Provider, MD  dextromethorphan-guaiFENesin (MUCINEX DM) 30-600 MG per 12 hr tablet Take 1 tablet by mouth every 12 (twelve) hours as needed.    Historical Provider, MD  esomeprazole (NEXIUM) 40 MG capsule Take 40 mg by mouth daily at 12 noon.    Historical Provider, MD  famotidine (PEPCID)  20 MG tablet Take 20 mg by mouth daily.    Historical Provider, MD  hydrochlorothiazide (HYDRODIURIL) 25 MG tablet Take 1 tablet (25 mg total) by mouth daily. 04/08/13   Neena Rhymes, MD  HYDROcodone-acetaminophen (NORCO/VICODIN) 5-325 MG per tablet Take 1-2 tablets by mouth every 6 (six) hours as needed for moderate pain.    Historical Provider, MD  hydrocortisone cream 1 % Apply 1 application topically as needed for itching.    Historical Provider, MD  ipratropium (ATROVENT) 0.03 % nasal spray Place 2 sprays into the nose 4 (four) times daily. 03/09/13   Shawnee Knapp, MD  magnesium gluconate (MAGONATE) 500 MG tablet Take 500 mg by mouth every other day.    Historical Provider, MD  Multiple Vitamin (MULTIVITAMIN WITH MINERALS) TABS Take 1 tablet by mouth daily.    Historical Provider, MD  naproxen sodium (ANAPROX) 220 MG tablet Take 440 mg by mouth 2 (two) times daily as needed (pain).    Historical Provider, MD  potassium gluconate (TH POTASSIUM GLUCONATE) 595 MG TABS Take 595 mg by mouth every other day.    Historical Provider, MD  PRESCRIPTION MEDICATION Take 1 tablet by mouth 2 (two) times daily as needed (pain). Hydrocodone product from [place] Provider, MD  vitamin E 400 UNIT capsule Take 400 Units by mouth every other day.     Historical Provider, MD   Pulse 69  Temp(Src) 97.8 F (36.6 C) (Oral)  Resp 20  Ht 5\' 5"  (1.651 m)  Wt 180 lb (81.647 kg)  BMI 29.95 kg/m2  SpO2 99% Physical Exam  Constitutional: She is oriented to person, place, and time. She appears well-developed and well-nourished. No distress.  HENT:  Head: Normocephalic and atraumatic.  Right nare has a small amount of blood at the opening of her nostril, no bleeding within nasal cavity.  Left nare is filled with clotted blood.  Small amount of blood in oropharynx  Eyes: Conjunctivae are normal. No scleral icterus.  Neck: Neck supple.  Cardiovascular: Normal rate and intact distal pulses.    Pulmonary/Chest: Effort normal. No stridor. No respiratory distress.  Abdominal: Normal appearance. She exhibits no distension.  Neurological: She is alert and oriented to person, place, and time.  Skin: Skin is warm and dry. No rash noted.  Psychiatric: She has a normal mood and affect. Her behavior is normal.  Nursing note and vitals reviewed.   ED Course  EPISTAXIS MANAGEMENT Date/Time: 03/29/2014 2:36 PM Performed by: Cyd Silence DAVID Authorized by: Cyd Silence DAVID Consent: Verbal consent obtained. Risks and  benefits: risks, benefits and alternatives were discussed Treatment site: left anterior Repair method: Afrin. Post-procedure assessment: bleeding stopped Treatment complexity: simple Recurrence: recurrence of recent bleed Patient tolerance: Patient tolerated the procedure well with no immediate complications   (including critical care time) Labs Review Labs Reviewed - No data to display  Imaging Review No results found.   EKG Interpretation None      MDM   Final diagnoses:  Left-sided epistaxis    Epistaxis controlled.  She is well appearing.  Vitals stable.      Houston Siren III, MD 03/29/14 [id number]

## 2014-03-29 NOTE — ED Notes (Addendum)
Pt was standing up praying when her nose started bleeding.  Unable to control.  Large amount of clots in sink per EMS.   Pt sates same last night.  C/o recent increased bruising.  PT is not on blood thinners.

## 2014-03-29 NOTE — ED Notes (Signed)
Dr Doy Mince removed dressing.  Sprayed afrin nostrils.  Holding pressure x 10 minutes.

## 2014-03-29 NOTE — Discharge Instructions (Signed)

## 2014-03-30 ENCOUNTER — Ambulatory Visit (INDEPENDENT_AMBULATORY_CARE_PROVIDER_SITE_OTHER): Payer: 59 | Admitting: Family Medicine

## 2014-03-30 VITALS — BP 148/82 | HR 65 | Temp 98.0°F | Resp 14 | Wt 200.2 lb

## 2014-03-30 DIAGNOSIS — R040 Epistaxis: Secondary | ICD-10-CM

## 2014-03-30 DIAGNOSIS — R5382 Chronic fatigue, unspecified: Secondary | ICD-10-CM

## 2014-03-30 DIAGNOSIS — Z1329 Encounter for screening for other suspected endocrine disorder: Secondary | ICD-10-CM

## 2014-03-30 LAB — COMPLETE METABOLIC PANEL WITH GFR
ALT: 32 U/L (ref 0–35)
AST: 24 U/L (ref 0–37)
Albumin: 4.2 g/dL (ref 3.5–5.2)
Alkaline Phosphatase: 87 U/L (ref 39–117)
BUN: 19 mg/dL (ref 6–23)
CO2: 28 mEq/L (ref 19–32)
Calcium: 9.9 mg/dL (ref 8.4–10.5)
Chloride: 102 mEq/L (ref 96–112)
Creat: 0.86 mg/dL (ref 0.50–1.10)
GFR, Est African American: 83 mL/min
GFR, Est Non African American: 72 mL/min
Glucose, Bld: 94 mg/dL (ref 70–99)
Potassium: 4.8 mEq/L (ref 3.5–5.3)
Sodium: 142 mEq/L (ref 135–145)
Total Bilirubin: 0.4 mg/dL (ref 0.2–1.2)
Total Protein: 7.4 g/dL (ref 6.0–8.3)

## 2014-03-30 LAB — POCT CBC
Granulocyte percent: 62.7 %G (ref 37–80)
HCT, POC: 42.2 % (ref 37.7–47.9)
Hemoglobin: 13.5 g/dL (ref 12.2–16.2)
Lymph, poc: 2.4 (ref 0.6–3.4)
MCH, POC: 29.1 pg (ref 27–31.2)
MCHC: 31.9 g/dL (ref 31.8–35.4)
MCV: 91.2 fL (ref 80–97)
MID (cbc): 0.6 (ref 0–0.9)
MPV: 7.1 fL (ref 0–99.8)
POC Granulocyte: 5 (ref 2–6.9)
POC LYMPH PERCENT: 29.6 %L (ref 10–50)
POC MID %: 7.7 %M (ref 0–12)
Platelet Count, POC: 320 10*3/uL (ref 142–424)
RBC: 4.63 M/uL (ref 4.04–5.48)
RDW, POC: 15.8 %
WBC: 8 10*3/uL (ref 4.6–10.2)

## 2014-03-30 LAB — T3, FREE: T3, Free: 2.6 pg/mL (ref 2.3–4.2)

## 2014-03-30 LAB — T4, FREE: Free T4: 1.05 ng/dL (ref 0.80–1.80)

## 2014-03-30 LAB — TSH: TSH: 4.05 u[IU]/mL (ref 0.350–4.500)

## 2014-03-30 NOTE — Progress Notes (Signed)
Chief Complaint:  Chief Complaint  Patient presents with  . Epistaxis    Went to the ED yesterday- told to f/u w/ PCP   . Shortness of Breath    Especially with exertion x2 days    HPI: [name] is a 64 y.o. female who is here to check if she ahs any clotting issues.   Yesterday seh developed a bad nose bleed. She then started thrwaing up the blood. Sink full of clots before the paramedics got there Was evaluated in ED and released without blood work, Needs follow-up with PCP was what she was told in ER. She states they were very busy. She has had abnormal bleeding, she ahs had this nose bleed but she rececently had an EP study done and had easy bruising as well.  She has a hx Palpitations-has had EP study but no circulation issues, she was kept overnight and per patient they  knicked an artery in her right groin and she had what sounds like a large hematoma and had bruising from this.  She wants to know if it could be the metoprolol or something else. She wa getting dixxy and so the meptrolol was decreased but this is one of the listed SE for metoprol Metorpolol 25 mg BID to 12.5 mg BID due to too much meds  She is also on losaratan, she is not on HCTZ , she is on metoprolol She was told to get blood work done to see if she ahd any malignancy due to easy brusing and bleeding She is a widow who has been married for last 3 years now, she lost her prior husband on her birthday to [place] but has since remarried, she is afraid something bad wrong and she doe snto know it.   PCP:  Georgie Chard Day MD  at [facility] Rheumatologist: Dr Ouida Sills ( per OV note he felt she di dnot have true Raynaud) ,  Dr Roetta Sessions [facility]      Past Medical History  Diagnosis Date  . Hypertension   . Migraine headache     hormonal related - less frequent off hormone replacement  . Synovial cyst of lumbar facet joint     lower back - s/p surgery   . Derangement of knee, left 2009      started with fall at work. Has had repair patella and torn meniscus  . Migraines   . Hx of appendectomy   . High cholesterol   . Allergy   . Arthritis   . Neuropathy 04/23/2013  . Psoriasis 04/23/2013  . Deformity of joint 04/23/2013  . SVT (supraventricular tachycardia)   . Raynaud disease    Past Surgical History  Procedure Laterality Date  . Ovarian cyst surgery  1972  . Appendectomy  1971  . Cervical discectomy  1999    diskectomy with fixation:plate and screws  . Synovial cyst excision  2007    lumbar spine  . Patellar reefing  '09    repair of fractured patella after fall  . Knee arthroscopy w/ meniscal repair  09    left knee  . Venous ablation      for pain in the groin - VVTS did procedure. Has some residual discomfort at the  ablation site.   . L4-5 lumbar fusion  September 02, 2010    Dr. Hal Neer:  minimally invasive transforaminal interbody fusion with spacer  . Spine surgery     History   Social  History  . Marital Status: Widowed    Spouse Name: N/A    Number of Children: N/A  . Years of Education: 14   Occupational History  . 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    Social History Main Topics  . Smoking status: Never Smoker   . Smokeless tobacco: Never Used  . Alcohol Use: No  . Drug Use: No  . Sexual Activity:    Partners: Male    Birth Control/ Protection: None   Other Topics Concern  . 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 Married '71- '10/widowed; engaged. 1 dtr- '72; 1 son '73; 4 grandchildren, 2 step-grands. 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    Family History  Problem Relation Age of Onset  . Diabetes Mother   . COPD Mother   . Aortic aneurysm Mother   . Hypertension Mother   . Cancer Father     lung  . Hyperlipidemia Father   . Hypertension Father   . Heart disease Father   . Diabetes Father   . Diabetes Brother   . Hypothyroidism  Mother    Allergies  Allergen Reactions  . Lyrica [Pregabalin] Anaphylaxis  . Lisinopril   . Novocain [Procaine]     dizziness  . Nsaids Swelling  . Tape     *adhesive* Reaction- blisters   . Caine-1 [Lidocaine Hcl] Palpitations  . Celebrex [Celecoxib] Palpitations  . Codeine Palpitations  . Nephron [Epinephrine] Palpitations    NASAL SPRAY  . Other Palpitations    Glucocorticoids/Steroids  . Procaine Hcl [Procaine Hcl] Palpitations  . Sulfa Antibiotics Palpitations   Prior to Admission medications   Medication Sig Start Date End Date Taking? Authorizing Provider  acetaminophen (TYLENOL) 500 MG tablet Take 500 mg by mouth every 6 (six) hours as needed.   Yes Historical Provider, MD  amitriptyline (ELAVIL) 10 MG tablet Take 4 tablets (40 mg total) by mouth at bedtime. Patient taking differently: Take 20 mg by mouth at bedtime.    Yes Hendricks Limes, MD  aspirin EC 81 MG tablet Take 81 mg by mouth daily.   Yes Historical Provider, MD  B Complex Vitamins (VITAMIN B COMPLEX PO) Take 1 tablet by mouth daily.    Yes Historical Provider, MD  calcium gluconate 500 MG tablet Take 1 tablet by mouth 3 (three) times daily.   Yes Historical Provider, MD  clonazePAM (KLONOPIN) 1 MG tablet Take 1 tablet (1 mg total) by mouth at bedtime as needed for anxiety.   Yes Neena Rhymes, MD  cyclobenzaprine (FLEXERIL) 10 MG tablet Take 10 mg by mouth 2 (two) times daily as needed for muscle spasms.    Yes Historical Provider, MD  dextromethorphan-guaiFENesin (MUCINEX DM) 30-600 MG per 12 hr tablet Take 1 tablet by mouth every 12 (twelve) hours as needed.   Yes Historical Provider, MD  esomeprazole (NEXIUM) 40 MG capsule Take 40 mg by mouth daily at 12 noon.   Yes Historical Provider, MD  HYDROcodone-acetaminophen (NORCO/VICODIN) 5-325 MG per tablet Take 1-2 tablets by mouth every 6 (six) hours as needed for moderate pain.   Yes Historical Provider, MD  hyoscyamine (ANASPAZ) 0.125 MG TBDP  disintergrating tablet Place 0.125 mg under the tongue.   Yes Historical Provider, MD  losartan (COZAAR) 25 MG tablet Take 20 mg by mouth daily.   Yes Historical Provider, MD  magnesium gluconate (MAGONATE) 500 MG tablet Take 500 mg  by mouth every other day.   Yes Historical Provider, MD  metoprolol tartrate (LOPRESSOR) 12.5 mg TABS tablet Take 12.5 mg by mouth 2 (two) times daily.   Yes Historical Provider, MD  Multiple Vitamin (MULTIVITAMIN WITH MINERALS) TABS Take 1 tablet by mouth daily.   Yes Historical Provider, MD  OMEGA-3 FATTY ACIDS PO Take 1 g by mouth.   Yes Historical Provider, MD  oxymetazoline (AFRIN) 0.05 % nasal spray Place 1 spray into both nostrils 2 (two) times daily.   Yes Historical Provider, MD  potassium gluconate (TH POTASSIUM GLUCONATE) 595 MG TABS Take 595 mg by mouth every other day.   Yes Historical Provider, MD  Teriparatide, Recombinant, 600 MCG/2.4ML SOLN Inject 20 mcg into the skin.   Yes Historical Provider, MD  tiZANidine (ZANAFLEX) 2 MG tablet Take 2 mg by mouth 3 (three) times daily.   Yes Historical Provider, MD  vitamin E 400 UNIT capsule Take 400 Units by mouth every other day.    Yes Historical Provider, MD  Ascorbic Acid (VITAMIN C) 500 MG CAPS Take 500 mg by mouth every other day.     Historical Provider, MD  Beclomethasone Diprop Monohyd (VANCENASE AQ DOUBLE STRENGTH NA) Place 1 spray into the nose 2 (two) times daily as needed (for allergies).    Historical Provider, MD  famotidine (PEPCID) 20 MG tablet Take 20 mg by mouth daily.    Historical Provider, MD  hydrochlorothiazide (HYDRODIURIL) 25 MG tablet Take 1 tablet (25 mg total) by mouth daily. Patient not taking: Reported on 03/30/2014 04/08/13   Neena Rhymes, MD  hydrocortisone cream 1 % Apply 1 application topically as needed for itching.    Historical Provider, MD  ipratropium (ATROVENT) 0.03 % nasal spray Place 2 sprays into the nose 4 (four) times daily. Patient not taking: Reported on 03/30/2014  03/09/13   Shawnee Knapp, MD  naproxen sodium (ANAPROX) 220 MG tablet Take 440 mg by mouth 2 (two) times daily as needed (pain).    Historical Provider, MD     ROS: The patient denies fevers, chills, night sweats, unintentional weight loss, chest pain, palpitations, wheezing, dyspnea on exertion, nausea, vomiting, abdominal pain, dysuria, hematuria, melena, + chronic numbness, weakness, or tingling.   All other systems have been reviewed and were otherwise negative with the exception of those mentioned in the HPI and as above.    PHYSICAL EXAM: Filed Vitals:   03/30/14 1439  BP: 148/82  Pulse: 65  Temp: 98 F (36.7 C)  Resp: 14   Filed Vitals:   03/30/14 1439  Weight: 200 lb 3.2 oz (90.81 kg)   Body mass index is 33.31 kg/(m^2).  General: Alert, no acute distress HEENT:  Normocephalic, atraumatic, oropharynx patent. EOMI, PERRLA, _ irritation of nasal mucosa, no active epistaxis, clots Cardiovascular:  Regular rate and rhythm, no rubs murmurs or gallops.  Radial pulse intact. No pedal edema.  Respiratory: Clear to auscultation bilaterally.  No wheezes, rales, or rhonchi.  No cyanosis, no use of accessory musculature GI: No organomegaly, abdomen is soft and non-tender, positive bowel sounds.  No masses. Skin: No rashes. Neurologic: Facial musculature symmetric. Psychiatric: Patient is appropriate throughout our interaction. Lymphatic: No cervical lymphadenopathy Musculoskeletal: Gait intact.   LABS: Results for orders placed or performed in visit on 03/30/14  POCT CBC  Result Value Ref Range   WBC 8.0 4.6 - 10.2 K/uL   Lymph, poc 2.4 0.6 - 3.4   POC LYMPH PERCENT 29.6 10 - 50 %L  MID (cbc) 0.6 0 - 0.9   POC MID % 7.7 0 - 12 %M   POC Granulocyte 5.0 2 - 6.9   Granulocyte percent 62.7 37 - 80 %G   RBC 4.63 4.04 - 5.48 M/uL   Hemoglobin 13.5 12.2 - 16.2 g/dL   HCT, POC 42.2 37.7 - 47.9 %   MCV 91.2 80 - 97 fL   MCH, POC 29.1 27 - 31.2 pg   MCHC 31.9 31.8 - 35.4 g/dL    RDW, POC 15.8 %   Platelet Count, POC 320 142 - 424 K/uL   MPV 7.1 0 - 99.8 fL     EKG/XRAY:   Primary read interpreted by Dr. Marin Comment at [facility].   ASSESSMENT/PLAN: Encounter Diagnoses  Name Primary?  . Epistaxis Yes  . Chronic fatigue   . Screening for thyroid disorder    She is here to get labs to make sure she does not have any abnormal malignancies. She went to the ER for epistaxis which I think is do to dry heat and also the fact she ahs been on aleve and and baby aspirin regular.  She stated the ER did not do any labs eventhough she bled a "whole sink full of clots", she is wants to know if she has any clotting disorder. She also has been tired.  CBC in house was WNL. Labs pending for fatigue workup, I am going to do limited workup since she is being followed by different specialist.  Will fax labs to [facility] Day MD  Gross sideeffects, risk and benefits, and alternatives of medications d/w patient. Patient is aware that all medications have potential sideeffects and we are unable to predict every sideeffect or drug-drug interaction that may occur.  Leshawn Houseworth, [place], DO 03/30/2014 4:20 PM

## 2014-03-31 LAB — PROTIME-INR
INR: 0.99 (ref <1.50)
Prothrombin Time: 13.1 seconds (ref 11.6–15.2)

## 2014-04-01 ENCOUNTER — Ambulatory Visit: Payer: Self-pay | Admitting: Family Medicine

## 2014-04-15 ENCOUNTER — Ambulatory Visit (INDEPENDENT_AMBULATORY_CARE_PROVIDER_SITE_OTHER): Payer: 59 | Admitting: Family Medicine

## 2014-04-15 VITALS — BP 128/78 | HR 73 | Temp 98.3°F | Resp 16 | Ht 64.75 in | Wt 200.8 lb

## 2014-04-15 DIAGNOSIS — R599 Enlarged lymph nodes, unspecified: Secondary | ICD-10-CM

## 2014-04-15 DIAGNOSIS — R8290 Unspecified abnormal findings in urine: Secondary | ICD-10-CM

## 2014-04-15 DIAGNOSIS — R8299 Other abnormal findings in urine: Secondary | ICD-10-CM

## 2014-04-15 DIAGNOSIS — Z87898 Personal history of other specified conditions: Secondary | ICD-10-CM

## 2014-04-15 DIAGNOSIS — J029 Acute pharyngitis, unspecified: Secondary | ICD-10-CM

## 2014-04-15 DIAGNOSIS — Z8709 Personal history of other diseases of the respiratory system: Secondary | ICD-10-CM

## 2014-04-15 DIAGNOSIS — R590 Localized enlarged lymph nodes: Secondary | ICD-10-CM

## 2014-04-15 DIAGNOSIS — K219 Gastro-esophageal reflux disease without esophagitis: Secondary | ICD-10-CM

## 2014-04-15 LAB — POCT UA - MICROSCOPIC ONLY
Bacteria, U Microscopic: NEGATIVE
Casts, Ur, LPF, POC: NEGATIVE
Crystals, Ur, HPF, POC: NEGATIVE
Epithelial cells, urine per micros: NEGATIVE
Mucus, UA: NEGATIVE
RBC, urine, microscopic: NEGATIVE
WBC, Ur, HPF, POC: NEGATIVE
Yeast, UA: NEGATIVE

## 2014-04-15 LAB — POCT CBC
Granulocyte percent: 62.8 %G (ref 37–80)
HCT, POC: 42.5 % (ref 37.7–47.9)
Hemoglobin: 13.7 g/dL (ref 12.2–16.2)
Lymph, poc: 2.2 (ref 0.6–3.4)
MCH, POC: 29.1 pg (ref 27–31.2)
MCHC: 32.2 g/dL (ref 31.8–35.4)
MCV: 90.3 fL (ref 80–97)
MID (cbc): 0.6 (ref 0–0.9)
MPV: 6.5 fL (ref 0–99.8)
POC Granulocyte: 4.6 (ref 2–6.9)
POC LYMPH PERCENT: 29.6 %L (ref 10–50)
POC MID %: 7.6 %M (ref 0–12)
Platelet Count, POC: 306 10*3/uL (ref 142–424)
RBC: 4.7 M/uL (ref 4.04–5.48)
RDW, POC: 15 %
WBC: 7.4 10*3/uL (ref 4.6–10.2)

## 2014-04-15 LAB — POCT URINALYSIS DIPSTICK
Bilirubin, UA: NEGATIVE
Blood, UA: NEGATIVE
Glucose, UA: NEGATIVE
Ketones, UA: NEGATIVE
Leukocytes, UA: NEGATIVE
Nitrite, UA: NEGATIVE
Protein, UA: NEGATIVE
Spec Grav, UA: 1.01
Urobilinogen, UA: 0.2
pH, UA: 7.5

## 2014-04-15 LAB — POCT RAPID STREP A (OFFICE): Rapid Strep A Screen: NEGATIVE

## 2014-04-15 MED ORDER — ESOMEPRAZOLE MAGNESIUM 40 MG PO CPDR: 40.0000 mg | DELAYED_RELEASE_CAPSULE | Freq: Every day | ORAL | Status: DC

## 2014-04-15 NOTE — Progress Notes (Signed)
Subjective: Patient is here for several things. She had a bad episode of epistaxis a couple of weeks ago. Has had one little bleeding since then. She had to have it cauterized. She has a couple of pictures of the large amount of blood in her St. He has not played for last couple of weeks now. She has had stuffiness in her right ear. Now she has a sore throat. No fever. She wonders whether she has strep or something. She is getting ready to go out of town and wanted to be certain. She's not coughing. She does have a little bit of cloudy urine. She is off of aspirin.   Patient also is having problems with her GERD bothering her more. She has been on Prilosec and Pepcid and doesn't feel like she gets the relief that she got with Nexium. She requests a new prescription for that.   Objective: TMs not well visible due to the cerumen in her years. Right was worse than left. Throat mildly erythematous. Strep test taken. Neck supple without significant nodes. Chest is clear to auscultation. Heart regular without murmurs.   Assessmen Pharyngitis History of epistaxis Change in urine GERD, exacerbated  Results for orders placed or performed in visit on 04/15/14  POCT CBC  Result Value Ref Range   WBC 7.4 4.6 - 10.2 K/uL   Lymph, poc 2.2 0.6 - 3.4   POC LYMPH PERCENT 29.6 10 - 50 %L   MID (cbc) 0.6 0 - 0.9   POC MID % 7.6 0 - 12 %M   POC Granulocyte 4.6 2 - 6.9   Granulocyte percent 62.8 37 - 80 %G   RBC 4.70 4.04 - 5.48 M/uL   Hemoglobin 13.7 12.2 - 16.2 g/dL   HCT, POC 42.5 37.7 - 47.9 %   MCV 90.3 80 - 97 fL   MCH, POC 29.1 27 - 31.2 pg   MCHC 32.2 31.8 - 35.4 g/dL   RDW, POC 15.0 %   Platelet Count, POC 306 142 - 424 K/uL   MPV 6.5 0 - 99.8 fL  POCT rapid strep A  Result Value Ref Range   Rapid Strep A Screen Negative Negative  POCT UA - Microscopic Only  Result Value Ref Range   WBC, Ur, HPF, POC neg    RBC, urine, microscopic neg    Bacteria, U Microscopic neg    Mucus, UA neg    Epithelial cells, urine per micros neg    Crystals, Ur, HPF, POC neg    Casts, Ur, LPF, POC neg    Yeast, UA neg   POCT urinalysis dipstick  Result Value Ref Range   Color, UA yellow    Clarity, UA clear    Glucose, UA neg    Bilirubin, UA neg    Ketones, UA neg    Spec Grav, UA 1.010    Blood, UA neg    pH, UA 7.5    Protein, UA neg    Urobilinogen, UA 0.2    Nitrite, UA neg    Leukocytes, UA Negative    Is my impression that she has a viral pharyngitis. Treat symptomatically.

## 2014-04-15 NOTE — Patient Instructions (Signed)
Take the Nexium for your reflux  If the ear gets more trouble please return  The sore throat is viral (not strep) and antibiotics will not treat it. It needs time to run its course. Treat symptomatically with Tylenol or lozenges orders or gargles.  Urine is normal, no evidence of infection  Return if neededpPharyngitis Pharyngitis is redness, pain, and swelling (inflammation) of your pharynx.  CAUSES  Pharyngitis is usually caused by infection. Most of the time, these infections are from viruses (viral) and are part of a cold. However, sometimes pharyngitis is caused by bacteria (bacterial). Pharyngitis can also be caused by allergies. Viral pharyngitis may be spread from person to person by coughing, sneezing, and personal items or utensils (cups, forks, spoons, toothbrushes). Bacterial pharyngitis may be spread from person to person by more intimate contact, such as kissing.  SIGNS AND SYMPTOMS  Symptoms of pharyngitis include:   Sore throat.   Tiredness (fatigue).   Low-grade fever.   Headache.  Joint pain and muscle aches.  Skin rashes.  Swollen lymph nodes.  Plaque-like film on throat or tonsils (often seen with bacterial pharyngitis). DIAGNOSIS  Your health care provider will ask you questions about your illness and your symptoms. Your medical history, along with a physical exam, is often all that is needed to diagnose pharyngitis. Sometimes, a rapid strep test is done. Other lab tests may also be done, depending on the suspected cause.  TREATMENT  Viral pharyngitis will usually get better in 3-4 days without the use of medicine. Bacterial pharyngitis is treated with medicines that kill germs (antibiotics).  HOME CARE INSTRUCTIONS   Drink enough water and fluids to keep your urine clear or pale yellow.   Only take over-the-counter or prescription medicines as directed by your health care provider:   If you are prescribed antibiotics, make sure you finish them even  if you start to feel better.   Do not take aspirin.   Get lots of rest.   Gargle with 8 oz of salt water ( tsp of salt per 1 qt of water) as often as every 1-2 hours to soothe your throat.   Throat lozenges (if you are not at risk for choking) or sprays may be used to soothe your throat. SEEK MEDICAL CARE IF:   You have large, tender lumps in your neck.  You have a rash.  You cough up green, yellow-brown, or bloody spit. SEEK IMMEDIATE MEDICAL CARE IF:   Your neck becomes stiff.  You drool or are unable to swallow liquids.  You vomit or are unable to keep medicines or liquids down.  You have severe pain that does not go away with the use of recommended medicines.  You have trouble breathing (not caused by a stuffy nose). MAKE SURE YOU:   Understand these instructions.  Will watch your condition.  Will get help right away if you are not doing well or get worse. Document Released: 02/27/2005 Document Revised: 12/18/2012 Document Reviewed: 11/04/2012 [facility] Patient Information 2015 [place], [place]. This information is not intended to replace advice given to you by your health care provider. Make sure you discuss any questions you have with your health care provider.

## 2014-04-16 ENCOUNTER — Other Ambulatory Visit: Payer: Self-pay | Admitting: Radiology

## 2014-04-16 ENCOUNTER — Telehealth: Payer: Self-pay

## 2014-04-16 DIAGNOSIS — R059 Cough, unspecified: Secondary | ICD-10-CM

## 2014-04-16 DIAGNOSIS — R05 Cough: Secondary | ICD-10-CM

## 2014-04-16 MED ORDER — AZITHROMYCIN 250 MG PO TABS
ORAL_TABLET | ORAL | Status: DC
Start: 2014-04-16 — End: 2014-10-25

## 2014-04-16 NOTE — Telephone Encounter (Signed)
Called pt, she states she is getting worse. She has yellow drainage and coughing more. She is feeling clammy and wants to see about getting an antibiotic. She has to go out of town this weekend. Please advise.

## 2014-04-16 NOTE — Telephone Encounter (Signed)
PA started for Nexium.

## 2014-04-16 NOTE — Telephone Encounter (Signed)
Patient says her "mucus has turned to infection". She is requesting an antibiotic

## 2014-04-17 NOTE — Telephone Encounter (Signed)
[facility] sent a fax stating the PA was denied. Plan only covers omeprazole and Pantoprozole. Did you want to write a Rx for one of these medications. Please advise.

## 2014-04-21 MED ORDER — PANTOPRAZOLE SODIUM 40 MG PO TBEC: 40.0000 mg | DELAYED_RELEASE_TABLET | Freq: Every day | ORAL | Status: DC

## 2014-04-21 NOTE — Telephone Encounter (Signed)
Sent to Rx [facility]

## 2014-04-21 NOTE — Telephone Encounter (Signed)
Okay to change to pantsoprasol 40mg ,  #30,  One daily,  RFx2

## 2014-04-21 NOTE — Telephone Encounter (Signed)
This was already taken care of.

## 2014-04-30 ENCOUNTER — Other Ambulatory Visit: Payer: Self-pay | Admitting: Family Medicine

## 2014-05-28 ENCOUNTER — Other Ambulatory Visit: Payer: Self-pay | Admitting: Family Medicine

## 2014-07-16 ENCOUNTER — Other Ambulatory Visit: Payer: Self-pay | Admitting: Otolaryngology

## 2014-07-16 DIAGNOSIS — R221 Localized swelling, mass and lump, neck: Secondary | ICD-10-CM

## 2014-08-04 ENCOUNTER — Other Ambulatory Visit: Payer: Self-pay | Admitting: Gastroenterology

## 2014-08-04 DIAGNOSIS — R7989 Other specified abnormal findings of blood chemistry: Secondary | ICD-10-CM

## 2014-08-04 DIAGNOSIS — R110 Nausea: Secondary | ICD-10-CM

## 2014-08-04 DIAGNOSIS — R945 Abnormal results of liver function studies: Principal | ICD-10-CM

## 2014-08-12 ENCOUNTER — Other Ambulatory Visit: Payer: 59

## 2014-08-19 ENCOUNTER — Ambulatory Visit
Admission: RE | Admit: 2014-08-19 | Discharge: 2014-08-19 | Disposition: A | Discharge status: Home / Self Care | Payer: 59 | Source: Ambulatory Visit | Attending: Gastroenterology | Admitting: Gastroenterology

## 2014-08-19 DIAGNOSIS — R110 Nausea: Secondary | ICD-10-CM

## 2014-08-19 DIAGNOSIS — R945 Abnormal results of liver function studies: Principal | ICD-10-CM

## 2014-08-19 DIAGNOSIS — R7989 Other specified abnormal findings of blood chemistry: Secondary | ICD-10-CM

## 2014-10-15 ENCOUNTER — Emergency Department (HOSPITAL_COMMUNITY): Payer: 59

## 2014-10-15 ENCOUNTER — Emergency Department (HOSPITAL_COMMUNITY)
Admission: EM | Admit: 2014-10-15 | Discharge: 2014-10-15 | Disposition: A | Discharge status: Home / Self Care | Payer: 59 | Attending: Emergency Medicine | Admitting: Emergency Medicine

## 2014-10-15 ENCOUNTER — Ambulatory Visit (INDEPENDENT_AMBULATORY_CARE_PROVIDER_SITE_OTHER): Payer: 59 | Admitting: Family Medicine

## 2014-10-15 ENCOUNTER — Encounter (HOSPITAL_COMMUNITY): Payer: Self-pay | Admitting: Emergency Medicine

## 2014-10-15 VITALS — BP 156/97 | HR 64 | Temp 98.2°F | Resp 16 | Ht 65.75 in | Wt 210.0 lb

## 2014-10-15 DIAGNOSIS — R5383 Other fatigue: Secondary | ICD-10-CM | POA: Diagnosis not present

## 2014-10-15 DIAGNOSIS — R200 Anesthesia of skin: Secondary | ICD-10-CM

## 2014-10-15 DIAGNOSIS — M549 Dorsalgia, unspecified: Secondary | ICD-10-CM | POA: Diagnosis not present

## 2014-10-15 DIAGNOSIS — F05 Delirium due to known physiological condition: Secondary | ICD-10-CM

## 2014-10-15 DIAGNOSIS — R42 Dizziness and giddiness: Secondary | ICD-10-CM

## 2014-10-15 DIAGNOSIS — R630 Anorexia: Secondary | ICD-10-CM | POA: Diagnosis not present

## 2014-10-15 DIAGNOSIS — R0789 Other chest pain: Secondary | ICD-10-CM

## 2014-10-15 DIAGNOSIS — H538 Other visual disturbances: Secondary | ICD-10-CM | POA: Diagnosis not present

## 2014-10-15 DIAGNOSIS — R531 Weakness: Secondary | ICD-10-CM | POA: Insufficient documentation

## 2014-10-15 DIAGNOSIS — R202 Paresthesia of skin: Secondary | ICD-10-CM | POA: Insufficient documentation

## 2014-10-15 DIAGNOSIS — R410 Disorientation, unspecified: Secondary | ICD-10-CM | POA: Insufficient documentation

## 2014-10-15 DIAGNOSIS — Z872 Personal history of diseases of the skin and subcutaneous tissue: Secondary | ICD-10-CM | POA: Insufficient documentation

## 2014-10-15 DIAGNOSIS — I10 Essential (primary) hypertension: Secondary | ICD-10-CM | POA: Diagnosis not present

## 2014-10-15 DIAGNOSIS — M1990 Unspecified osteoarthritis, unspecified site: Secondary | ICD-10-CM | POA: Diagnosis not present

## 2014-10-15 DIAGNOSIS — R208 Other disturbances of skin sensation: Secondary | ICD-10-CM | POA: Diagnosis not present

## 2014-10-15 DIAGNOSIS — Z8639 Personal history of other endocrine, nutritional and metabolic disease: Secondary | ICD-10-CM | POA: Diagnosis not present

## 2014-10-15 DIAGNOSIS — Z7952 Long term (current) use of systemic steroids: Secondary | ICD-10-CM | POA: Diagnosis not present

## 2014-10-15 DIAGNOSIS — R4182 Altered mental status, unspecified: Secondary | ICD-10-CM | POA: Diagnosis present

## 2014-10-15 DIAGNOSIS — G43909 Migraine, unspecified, not intractable, without status migrainosus: Secondary | ICD-10-CM | POA: Diagnosis not present

## 2014-10-15 DIAGNOSIS — Z79899 Other long term (current) drug therapy: Secondary | ICD-10-CM | POA: Diagnosis not present

## 2014-10-15 LAB — RAPID URINE DRUG SCREEN, HOSP PERFORMED
Amphetamines: NOT DETECTED
Barbiturates: NOT DETECTED
Benzodiazepines: NOT DETECTED
Cocaine: NOT DETECTED
Opiates: NOT DETECTED
Tetrahydrocannabinol: NOT DETECTED

## 2014-10-15 LAB — COMPREHENSIVE METABOLIC PANEL
ALT: 38 U/L (ref 14–54)
AST: 33 U/L (ref 15–41)
Albumin: 4 g/dL (ref 3.5–5.0)
Alkaline Phosphatase: 84 U/L (ref 38–126)
Anion gap: 11 (ref 5–15)
BUN: 14 mg/dL (ref 6–20)
CO2: 23 mmol/L (ref 22–32)
Calcium: 9.1 mg/dL (ref 8.9–10.3)
Chloride: 104 mmol/L (ref 101–111)
Creatinine, Ser: 0.95 mg/dL (ref 0.44–1.00)
GFR calc Af Amer: >60 mL/min (ref >60)
GFR calc non Af Amer: >60 mL/min (ref >60)
Glucose, Bld: 91 mg/dL (ref 65–99)
Potassium: 4.1 mmol/L (ref 3.5–5.1)
Sodium: 138 mmol/L (ref 135–145)
Total Bilirubin: 0.6 mg/dL (ref 0.3–1.2)
Total Protein: 7.6 g/dL (ref 6.5–8.1)

## 2014-10-15 LAB — URINALYSIS, ROUTINE W REFLEX MICROSCOPIC
Bilirubin Urine: NEGATIVE
Glucose, UA: NEGATIVE mg/dL
Hgb urine dipstick: NEGATIVE
Ketones, ur: NEGATIVE mg/dL
Nitrite: NEGATIVE
Protein, ur: NEGATIVE mg/dL
Specific Gravity, Urine: 1.005 (ref 1.005–1.030)
Urobilinogen, UA: 0.2 mg/dL (ref 0.0–1.0)
pH: 7 (ref 5.0–8.0)

## 2014-10-15 LAB — GLUCOSE, POCT (MANUAL RESULT ENTRY): POC Glucose: 103 mg/dl — AB (ref 70–99)

## 2014-10-15 LAB — CBC WITH DIFFERENTIAL/PLATELET
Basophils Absolute: 0 10*3/uL (ref 0.0–0.1)
Basophils Relative: 1 % (ref 0–1)
Eosinophils Absolute: 0.3 10*3/uL (ref 0.0–0.7)
Eosinophils Relative: 4 % (ref 0–5)
HCT: 43.5 % (ref 36.0–46.0)
Hemoglobin: 14.5 g/dL (ref 12.0–15.0)
Lymphocytes Relative: 28 % (ref 12–46)
Lymphs Abs: 2.2 10*3/uL (ref 0.7–4.0)
MCH: 29.4 pg (ref 26.0–34.0)
MCHC: 33.3 g/dL (ref 30.0–36.0)
MCV: 88.2 fL (ref 78.0–100.0)
Monocytes Absolute: 0.4 10*3/uL (ref 0.1–1.0)
Monocytes Relative: 5 % (ref 3–12)
Neutro Abs: 5.1 10*3/uL (ref 1.7–7.7)
Neutrophils Relative %: 62 % (ref 43–77)
Platelets: 251 10*3/uL (ref 150–400)
RBC: 4.93 MIL/uL (ref 3.87–5.11)
RDW: 14.1 % (ref 11.5–15.5)
WBC: 8.1 10*3/uL (ref 4.0–10.5)

## 2014-10-15 LAB — TROPONIN I
Troponin I: <0.03 ng/mL (ref <0.031)
Troponin I: <0.03 ng/mL (ref <0.031)

## 2014-10-15 LAB — URINE MICROSCOPIC-ADD ON

## 2014-10-15 MED ORDER — SODIUM CHLORIDE 0.9 % IV BOLUS (SEPSIS)
1000.0000 mL | Freq: Once | INTRAVENOUS | Status: AC
  Administered 2014-10-15: 1000 mL via INTRAVENOUS

## 2014-10-15 NOTE — ED Notes (Signed)
Pt states she woke up this morning at 0530 and felt weak all over and had blurry vision. Pt states she drove to work and felt very confused and like "she couldn't function". Pt was sent from urgent care for further evaluation. Pt is alert and ox3. Denies any pain at this time but states she had some intermittent chest pain today.

## 2014-10-15 NOTE — ED Notes (Signed)
Patient returned from MRI.

## 2014-10-15 NOTE — ED Provider Notes (Signed)
CSN: [id number]     Arrival date & time 10/15/14  1548 History   First MD Initiated Contact with Patient 10/15/14 1556     Chief Complaint  Patient presents with  . Altered Mental Status     (Consider location/radiation/quality/duration/timing/severity/associated sxs/prior Treatment) HPI Comments: Patient from UC with generalized weakness, confusion, blurred vision since waking up this morning at 530.  She was driving to work and forget where she was going for a few minutes.  Her "body feels heavy" and she no longer feels confused.  Endorses central chest pain x 5 minutes that is now resolved, attributed to anxiety.  No cardiac history other than SVT. Hx intermittent numbness in L arm x 6 months, being worked up for carpal tunnel by Dr. Grandville Silos. Denies weakness in arms or legs without focality. Denies dizziness, lightheadedness, nausea or vomiting. Denies any fever. Denies any recent medication changes. She did take Mucinex which she does not normally do.  The history is provided by a relative and the patient.    Past Medical History  Diagnosis Date  . Hypertension   . Migraine headache     hormonal related - less frequent off hormone replacement  . Synovial cyst of lumbar facet joint     lower back - s/p surgery   . Derangement of knee, left 2009    started with fall at work. Has had repair patella and torn meniscus  . Migraines   . Hx of appendectomy   . High cholesterol   . Allergy   . Arthritis   . Neuropathy 04/23/2013  . Psoriasis 04/23/2013  . Deformity of joint 04/23/2013  . SVT (supraventricular tachycardia)   . Raynaud disease    Past Surgical History  Procedure Laterality Date  . Ovarian cyst surgery  1972  . Appendectomy  1971  . Cervical discectomy  1999    diskectomy with fixation:plate and screws  . Synovial cyst excision  2007    lumbar spine  . Patellar reefing  '09    repair of fractured patella after fall  . Knee arthroscopy w/ meniscal repair  09    left  knee  . Venous ablation      for pain in the groin - VVTS did procedure. Has some residual discomfort at the  ablation site.   . L4-5 lumbar fusion  September 02, 2010    Dr. Hal Neer:  minimally invasive transforaminal interbody fusion with spacer  . Spine surgery     Family History  Problem Relation Age of Onset  . Diabetes Mother   . COPD Mother   . Aortic aneurysm Mother   . Hypertension Mother   . Cancer Father     lung  . Hyperlipidemia Father   . Hypertension Father   . Heart disease Father   . Diabetes Father   . Diabetes Brother   . Hypothyroidism Mother    History  Substance Use Topics  . Smoking status: Never Smoker   . Smokeless tobacco: Never Used  . Alcohol Use: No   OB History    No data available     Review of Systems  Constitutional: Positive for activity change, appetite change and fatigue. Negative for fever.  HENT: Negative for congestion and rhinorrhea.   Eyes: Positive for visual disturbance. Negative for photophobia.  Respiratory: Positive for chest tightness. Negative for cough and shortness of breath.   Cardiovascular: Positive for chest pain.  Gastrointestinal: Negative for nausea, vomiting and abdominal pain.  Genitourinary: Negative  for dysuria, hematuria, vaginal bleeding and vaginal discharge.  Musculoskeletal: Positive for myalgias, back pain and arthralgias. Negative for neck pain and neck stiffness.  Skin: Negative for color change and rash.  Neurological: Positive for dizziness, weakness and light-headedness. Negative for headaches.  A complete 10 system review of systems was obtained and all systems are negative except as noted in the HPI and PMH.      Allergies  Lyrica; Lisinopril; Novocain; Nsaids; Tape; Caine-1; Celebrex; Codeine; Nephron; Other; Procaine hcl; and Sulfa antibiotics  Home Medications   Prior to Admission medications   Medication Sig Start Date End Date Taking? Authorizing Provider  acetaminophen (TYLENOL) 500 MG  tablet Take 500 mg by mouth every 6 (six) hours as needed for mild pain, moderate pain or headache.    Yes Historical Provider, MD  amitriptyline (ELAVIL) 10 MG tablet Take 4 tablets (40 mg total) by mouth at bedtime. Patient taking differently: Take 20 mg by mouth at bedtime.    Yes Hendricks Limes, MD  aspirin EC 81 MG tablet Take 81 mg by mouth every other day.    Yes Historical Provider, MD  B Complex Vitamins (VITAMIN B COMPLEX PO) Take 1 tablet by mouth daily.    Yes Historical Provider, MD  calcium gluconate 500 MG tablet Take 1 tablet by mouth daily.    Yes Historical Provider, MD  clonazePAM (KLONOPIN) 1 MG tablet Take 1 tablet (1 mg total) by mouth at bedtime as needed for anxiety. Patient taking differently: Take 1 mg by mouth at bedtime.    Yes Neena Rhymes, MD  esomeprazole (NEXIUM) 40 MG capsule Take 1 capsule (40 mg total) by mouth daily at 12 noon. Patient taking differently: Take 40 mg by mouth at bedtime.  04/15/14  Yes Posey Boyer, MD  HYDROcodone-acetaminophen (NORCO/VICODIN) 5-325 MG per tablet Take 1-2 tablets by mouth every 6 (six) hours as needed for moderate pain.   Yes Historical Provider, MD  losartan (COZAAR) 25 MG tablet Take 25 mg by mouth daily.    Yes Historical Provider, MD  magnesium gluconate (MAGONATE) 500 MG tablet Take 500 mg by mouth daily.    Yes Historical Provider, MD  metoprolol tartrate (LOPRESSOR) 25 MG tablet Take 12.5 mg by mouth 2 (two) times daily.   Yes Historical Provider, MD  Multiple Vitamin (MULTIVITAMIN WITH MINERALS) TABS Take 1 tablet by mouth daily.   Yes Historical Provider, MD  naproxen sodium (ANAPROX) 220 MG tablet Take 440 mg by mouth 2 (two) times daily as needed (pain).   Yes Historical Provider, MD  OMEGA-3 FATTY ACIDS PO Take 1 g by mouth.   Yes Historical Provider, MD  sucralfate (CARAFATE) 1 G tablet Take 1 g by mouth daily. 09/03/14  Yes Historical Provider, MD  tiZANidine (ZANAFLEX) 2 MG tablet Take 2 mg by mouth 2 (two)  times daily.    Yes Historical Provider, MD  vitamin E 400 UNIT capsule Take 400 Units by mouth every other day.    Yes Historical Provider, MD  azithromycin (ZITHROMAX) 250 MG tablet Take 2 tabs PO x 1 dose, then 1 tab PO QD x 4 days Patient not taking: Reported on 10/15/2014 04/16/14   Posey Boyer, MD  hydrochlorothiazide (HYDRODIURIL) 25 MG tablet Take 1 tablet (25 mg total) by mouth daily. Patient not taking: Reported on 10/15/2014 04/08/13   Neena Rhymes, MD  ipratropium (ATROVENT) 0.03 % nasal spray Place 2 sprays into the nose 4 (four) times daily. Patient not  taking: Reported on 10/15/2014 03/09/13   Shawnee Knapp, MD  pantoprazole (PROTONIX) 40 MG tablet Take 1 tablet (40 mg total) by mouth daily. PATIENT NEEDS OFFICE VISIT/FASTING LABS FOR ADDITIONAL REFILLS Patient not taking: Reported on 10/15/2014 05/29/14   Tereasa Coop, PA-C   BP 138/64 mmHg  Pulse 74  Temp(Src) 97.5 F (36.4 C) (Oral)  Resp 16  Ht 5\' 5"  (1.651 m)  Wt 190 lb (86.183 kg)  BMI 31.62 kg/m2  SpO2 94% Physical Exam  Constitutional: She is oriented to person, place, and time. She appears well-developed and well-nourished. No distress.  HENT:  Head: Normocephalic and atraumatic.  Mouth/Throat: Oropharynx is clear and moist. No oropharyngeal exudate.  Eyes: Conjunctivae and EOM are normal. Pupils are equal, round, and reactive to light.  Neck: Normal range of motion. Neck supple.  No meningismus.  Cardiovascular: Normal rate, regular rhythm, normal heart sounds and intact distal pulses.   No murmur heard. Pulmonary/Chest: Effort normal and breath sounds normal. No respiratory distress. She exhibits no tenderness.  Abdominal: Soft. There is no tenderness. There is no rebound and no guarding.  Genitourinary: No vaginal discharge found.  Musculoskeletal: Normal range of motion. She exhibits no edema or tenderness.  Neurological: She is alert and oriented to person, place, and time. No cranial nerve deficit. She  exhibits normal muscle tone. Coordination normal.  No ataxia on finger to nose bilaterally. No pronator drift. 5/5 strength throughout. CN 2-12 intact. Positive romberg. Equal grip strength. Sensation intact.  Skin: Skin is warm.  Psychiatric: She has a normal mood and affect. Her behavior is normal.  Nursing note and vitals reviewed.   ED Course  Procedures (including critical care time) Labs Review Labs Reviewed  URINALYSIS, ROUTINE W REFLEX MICROSCOPIC (NOT AT [facility]) - Abnormal; Notable for the following:    Leukocytes, UA TRACE (*)    All other components within normal limits  CBC WITH DIFFERENTIAL/PLATELET  COMPREHENSIVE METABOLIC PANEL  TROPONIN I  URINE RAPID DRUG SCREEN, HOSP PERFORMED  URINE MICROSCOPIC-ADD ON  TROPONIN I    Imaging Review Dg Chest 2 View  10/15/2014   CLINICAL DATA:  Confusion and weakness beginning this morning. Initial encounter.  EXAM: CHEST  2 VIEW  COMPARISON:  PA and lateral chest 06/11/2012.  FINDINGS: Minimal linear atelectasis or scar is seen in the left lung base. The lungs are otherwise clear. Heart size is normal. No pneumothorax or pleural effusion. No focal bony abnormality.  IMPRESSION: No acute disease.   Electronically Signed   By: Inge Rise M.D.   On: 10/15/2014 16:51   Ct Head Wo Contrast  10/15/2014   CLINICAL DATA:  Weakness and blurry vision beginning this morning. Confusion. Initial encounter.  EXAM: CT HEAD WITHOUT CONTRAST  TECHNIQUE: Contiguous axial images were obtained from the base of the skull through the vertex without intravenous contrast.  COMPARISON:  None.  FINDINGS: There is no evidence of acute intracranial abnormality including hemorrhage, infarct, mass lesion, mass effect, midline shift or abnormal extra-axial fluid collection. No hydrocephalus or pneumocephalus. The calvarium is intact. Imaged paranasal sinuses and mastoid air cells are clear.  IMPRESSION: Negative head CT.   Electronically Signed   By: Inge Rise  M.D.   On: 10/15/2014 18:17   Mr Brain Wo Contrast  10/15/2014   CLINICAL DATA:  Confusion  EXAM: MRI HEAD WITHOUT CONTRAST  TECHNIQUE: Multiplanar, multiecho pulse sequences of the brain and surrounding structures were obtained without intravenous contrast.  COMPARISON:  CT head  10/15/2014  FINDINGS: Ventricle size and cerebral volume normal. Pituitary normal in size. Craniocervical junction normal.  Negative for acute or chronic infarction. Negative for demyelinating disease  Brainstem and cerebellum normal  Negative for hemorrhage or mass. No fluid collection or shift of the midline structures.  Paranasal sinuses clear.  Normal orbit bilaterally.  IMPRESSION: Negative   Electronically Signed   By: Franchot Gallo M.D.   On: 10/15/2014 20:19     EKG Interpretation   Date/Time:  Thursday October 15 2014 15:54:46 EDT Ventricular Rate:  60 PR Interval:  161 QRS Duration: 91 QT Interval:  412 QTC Calculation: 412 R Axis:   50 Text Interpretation:  Sinus rhythm No significant change was found  Confirmed by Wyvonnia Dusky  MD, Calhoun Reichardt [phone] on 10/15/2014 3:56:57 PM      MDM   Final diagnoses:  Confusion   Confusion, AMS, blurred vision since this morning.  Vision now normal.  Intermittent central chest pain x5 minutes, now resolved.   EKG normal sinus rhythm. Patient on multiple sedating medications. Denies any new medications or excessive dosages. She did take Mucinex in addition to her regular medications.  Patient with intermittent numbness and tingling in the left arm daily for the past 6 months. She has been worked up by Dr. Grandville Silos. She reports having a MRI of her cervical spine which showed no cervical radiculopathy.  Labs unremarkable. Troponin negative. CT head is normal.  Discussed with Dr. Janann Colonel of neurology. He recommends MRI to evaluate for infarct. He feels if negative, she can be discharged for outpatient follow-up. Consider TIA versus transient global amnesia.  Her chest pain  is atypical for ACS. Her EKG is normal sinus rhythm. Troponin is negative.  MRI is negative for acute infarct. Delta troponin is negative. Patient is back to baseline per family. She is ambulatory and tolerating by mouth. Discussed with patient she needs outpatient workup for TIA including echocardiogram and carotid Dopplers. Continue baby aspirin daily. Follow up with PCP and neurology. Return precautions discussed.  Ezequiel Essex, MD 10/16/14 [phone]

## 2014-10-15 NOTE — Discharge Instructions (Signed)
Confusion Your testing is negative for stroke. Follow-up with the neurologist and your primary care doctor. you need have an echocardiogram and ultrasound of the blood vessels in your neck. Continue to take an aspirin daily. Return to the ED if you develop new or worsening symptoms. Confusion is the inability to think with your usual speed or clarity. Confusion may come on quickly or slowly over time. How quickly the confusion comes on depends on the cause. Confusion can be due to any number of causes. CAUSES   Concussion, head injury, or head trauma.  Seizures.  Stroke.  Fever.  Brain tumor.  Age related decreased brain function (dementia).  Heightened emotional states like rage or terror.  Mental illness in which the person loses the ability to determine what is real and what is not (hallucinations).  Infections such as a urinary tract infection (UTI).  Toxic effects from alcohol, drugs, or prescription medicines.  Dehydration and an imbalance of salts in the body (electrolytes).  Lack of sleep.  Low blood sugar (diabetes).  Low levels of oxygen from conditions such as chronic lung disorders.  Drug interactions or other medicine side effects.  Nutritional deficiencies, especially niacin, thiamine, vitamin C, or vitamin B.  Sudden drop in body temperature (hypothermia).  Change in routine, such as when traveling or hospitalized. SIGNS AND SYMPTOMS  People often describe their thinking as cloudy or unclear when they are confused. Confusion can also include feeling disoriented. That means you are unaware of where or who you are. You may also not know what the date or time is. If confused, you may also have difficulty paying attention, remembering, and making decisions. Some people also act aggressively when they are confused.  DIAGNOSIS  The medical evaluation of confusion may include:  Blood and urine tests.  X-rays.  Brain and nervous system tests.  Analyzing your  brain waves (electroencephalogram or EEG).  Magnetic resonance imaging (MRI) of your head.  Computed tomography (CT) scan of your head.  Mental status tests in which your health care provider may ask many questions. Some of these questions may seem silly or strange, but they are a very important test to help diagnose and treat confusion. TREATMENT  An admission to the hospital may not be needed, but a person with confusion should not be left alone. Stay with a family member or friend until the confusion clears. Avoid alcohol, pain relievers, or sedative drugs until you have fully recovered. Do not drive until directed by your health care provider. HOME CARE INSTRUCTIONS  What family and friends can do:  To find out if someone is confused, ask the person to state his or her name, age, and the date. If the person is unsure or answers incorrectly, he or she is confused.  Always introduce yourself, no matter how well the person knows you.  Often remind the person of his or her location.  Place a calendar and clock near the confused person.  Help the person with his or her medicines. You may want to use a pill box, an alarm as a reminder, or give the person each dose as prescribed.  Talk about current events and plans for the day.  Try to keep the environment calm, quiet, and peaceful.  Make sure the person keeps follow-up visits with his or her health care provider. PREVENTION  Ways to prevent confusion:  Avoid alcohol.  Eat a balanced diet.  Get enough sleep.  Take medicine only as directed by your health care  provider.  Do not become isolated. Spend time with other people and make plans for your days.  Keep careful watch on your blood sugar levels if you are diabetic. SEEK IMMEDIATE MEDICAL CARE IF:   You develop severe headaches, repeated vomiting, seizures, blackouts, or slurred speech.  There is increasing confusion, weakness, numbness, restlessness, or personality  changes.  You develop a loss of balance, have marked dizziness, feel uncoordinated, or fall.  You have delusions, hallucinations, or develop severe anxiety.  Your family members think you need to be rechecked. Document Released: 04/06/2004 Document Revised: 07/14/2013 Document Reviewed: 04/04/2013 [facility] Patient Information 2015 [place], [place]. This information is not intended to replace advice given to you by your health care provider. Make sure you discuss any questions you have with your health care provider.

## 2014-10-15 NOTE — Patient Instructions (Signed)
Sent to ER by EMS.  

## 2014-10-15 NOTE — Progress Notes (Signed)
Subjective:  This chart was scribed for Taylor Parish, MD by Leandra Kern, Medical Scribe. This patient was seen in Room 7 and the patient's care was started at 2:50 PM.    Patient ID: [name], female    DOB: April 01, 1950, 64 y.o.   MRN: [id number]  Chief Complaint  Patient presents with  . Numbness    left side   . confusion  . Chest Pain  . Blurred Vision   HPI  The provider was pulled to the room acutely due to pt's complaints.  HPI Comments: [name] is a 64 y.o. female who presents to [facility] complaining of left sided numbness, onset this morning at around 5:30 am before going to work. Pt reports associated symptoms of chest tightness, confusion starting around 5:30 am, fatigue, blurred vision with starting onset, tingling sensation in her arms, mild shortness of breath, and mild headache. Pt reports that her chest tightness could be anxiety related to her current symptoms. She also states that her confusion worsened when she got to work and has been constant throughout the day. She indicates that after she started experiencing her symptoms she ate sugary food due to her concern that it might be sugar related symptoms. Pt denies slurred speech. She notes that she takes Aspirin once at night time, between one night and the other. Pt denies any sedating or new medications this morning. .   Pt has a history of left arm numbness or feeling like it was asleep for months now. She is followed by Dr. Grandville Silos at [facility]. She notes experiencing similar weakness in her left arm today than her previous symptoms. Hx of SVT but denies palpitations today, did have dyspnea. Pt was advised to be at a high risk for DVT in the past due to venous insufficiency.  Patient Active Problem List   Diagnosis Date Noted  . Neuropathy 04/23/2013  . Psoriasis 04/23/2013  . Deformity of joint 04/23/2013  . Peripheral cyanosis 09/28/2012  . Routine  health maintenance 08/17/2011  . Anemia, iron deficiency 01/22/2011  . Chronic pain 09/22/2010  . Depression with anxiety 09/21/2010  . Spinal stenosis 08/14/2010  . Hypertension 07/08/2010  . Migraine 07/08/2010  . Knee internal derangement 07/08/2010  . Non-cardiac chest pain 07/08/2010   Past Medical History  Diagnosis Date  . Hypertension   . Migraine headache     hormonal related - less frequent off hormone replacement  . Synovial cyst of lumbar facet joint     lower back - s/p surgery   . Derangement of knee, left 2009    started with fall at work. Has had repair patella and torn meniscus  . Migraines   . Hx of appendectomy   . High cholesterol   . Allergy   . Arthritis   . Neuropathy 04/23/2013  . Psoriasis 04/23/2013  . Deformity of joint 04/23/2013  . SVT (supraventricular tachycardia)   . Raynaud disease    Past Surgical History  Procedure Laterality Date  . Ovarian cyst surgery  1972  . Appendectomy  1971  . Cervical discectomy  1999    diskectomy with fixation:plate and screws  . Synovial cyst excision  2007    lumbar spine  . Patellar reefing  '09    repair of fractured patella after fall  . Knee arthroscopy w/ meniscal repair  09    left knee  . Venous ablation      for pain in  the groin - VVTS did procedure. Has some residual discomfort at the  ablation site.   . L4-5 lumbar fusion  September 02, 2010    Dr. Hal Neer:  minimally invasive transforaminal interbody fusion with spacer  . Spine surgery     Allergies  Allergen Reactions  . Lyrica [Pregabalin] Anaphylaxis  . Lisinopril   . Novocain [Procaine]     dizziness  . Nsaids Swelling  . Tape     *adhesive* Reaction- blisters   . Caine-1 [Lidocaine Hcl] Palpitations  . Celebrex [Celecoxib] Palpitations  . Codeine Palpitations  . Nephron [Epinephrine] Palpitations    NASAL SPRAY  . Other Palpitations    Glucocorticoids/Steroids  . Procaine Hcl [Procaine Hcl] Palpitations  . Sulfa Antibiotics  Palpitations   Prior to Admission medications   Medication Sig Start Date End Date Taking? Authorizing Provider  acetaminophen (TYLENOL) 500 MG tablet Take 500 mg by mouth every 6 (six) hours as needed.    Historical Provider, MD  amitriptyline (ELAVIL) 10 MG tablet Take 4 tablets (40 mg total) by mouth at bedtime. Patient taking differently: Take 20 mg by mouth at bedtime.     Hendricks Limes, MD  Ascorbic Acid (VITAMIN C) 500 MG CAPS Take 500 mg by mouth every other day.     Historical Provider, MD  aspirin EC 81 MG tablet Take 81 mg by mouth daily.    Historical Provider, MD  azithromycin (ZITHROMAX) 250 MG tablet Take 2 tabs PO x 1 dose, then 1 tab PO QD x 4 days 04/16/14   Posey Boyer, MD  B Complex Vitamins (VITAMIN B COMPLEX PO) Take 1 tablet by mouth daily.     Historical Provider, MD  Beclomethasone Diprop Monohyd (VANCENASE AQ DOUBLE STRENGTH NA) Place 1 spray into the nose 2 (two) times daily as needed (for allergies).    Historical Provider, MD  calcium gluconate 500 MG tablet Take 1 tablet by mouth 3 (three) times daily.    Historical Provider, MD  clonazePAM (KLONOPIN) 1 MG tablet Take 1 tablet (1 mg total) by mouth at bedtime as needed for anxiety.    Neena Rhymes, MD  dextromethorphan-guaiFENesin [facility] DM) 30-600 MG per 12 hr tablet Take 1 tablet by mouth every 12 (twelve) hours as needed.    Historical Provider, MD  esomeprazole (NEXIUM) 40 MG capsule Take 1 capsule (40 mg total) by mouth daily at 12 noon. 04/15/14   Posey Boyer, MD  famotidine (PEPCID) 20 MG tablet Take 20 mg by mouth daily.    Historical Provider, MD  hydrochlorothiazide (HYDRODIURIL) 25 MG tablet Take 1 tablet (25 mg total) by mouth daily. 04/08/13   Neena Rhymes, MD  HYDROcodone-acetaminophen (NORCO/VICODIN) 5-325 MG per tablet Take 1-2 tablets by mouth every 6 (six) hours as needed for moderate pain.    Historical Provider, MD  hydrocortisone cream 1 % Apply 1 application topically as needed for  itching.    Historical Provider, MD  hyoscyamine (ANASPAZ) 0.125 MG TBDP disintergrating tablet Place 0.125 mg under the tongue.    Historical Provider, MD  ipratropium (ATROVENT) 0.03 % nasal spray Place 2 sprays into the nose 4 (four) times daily. 03/09/13   Shawnee Knapp, MD  losartan (COZAAR) 25 MG tablet Take 20 mg by mouth daily.    Historical Provider, MD  magnesium gluconate (MAGONATE) 500 MG tablet Take 500 mg by mouth every other day.    Historical Provider, MD  metoprolol tartrate (LOPRESSOR) 12.5 mg TABS tablet  Take 12.5 mg by mouth 2 (two) times daily.    Historical Provider, MD  Multiple Vitamin (MULTIVITAMIN WITH MINERALS) TABS Take 1 tablet by mouth daily.    Historical Provider, MD  naproxen sodium (ANAPROX) 220 MG tablet Take 440 mg by mouth 2 (two) times daily as needed (pain).    Historical Provider, MD  OMEGA-3 FATTY ACIDS PO Take 1 g by mouth.    Historical Provider, MD  oxymetazoline (AFRIN) 0.05 % nasal spray Place 1 spray into both nostrils 2 (two) times daily.    Historical Provider, MD  pantoprazole (PROTONIX) 40 MG tablet Take 1 tablet (40 mg total) by mouth daily. PATIENT NEEDS OFFICE VISIT/FASTING LABS FOR ADDITIONAL REFILLS 05/29/14   Tereasa Coop, PA-C  potassium gluconate (TH POTASSIUM GLUCONATE) 595 MG TABS Take 595 mg by mouth every other day.    Historical Provider, MD  Teriparatide, Recombinant, 600 MCG/2.4ML SOLN Inject 20 mcg into the skin.    Historical Provider, MD  tiZANidine (ZANAFLEX) 2 MG tablet Take 2 mg by mouth 3 (three) times daily.    Historical Provider, MD  vitamin E 400 UNIT capsule Take 400 Units by mouth every other day.     Historical Provider, MD   History   Social History  . Marital Status: Widowed    Spouse Name: N/A  . Number of Children: N/A  . Years of Education: 14   Occupational History  . 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    Social History Main Topics  . Smoking status: Never Smoker   . Smokeless tobacco: Never Used  . Alcohol Use: No  .  Drug Use: No  . Sexual Activity:    Partners: Male    Birth Control/ Protection: None   Other Topics Concern  . 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 1 dtr- '72; 1 son '73; 4 grandchildren, 2 step-grands. 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     Review of Systems  Constitutional: Positive for fatigue.  Eyes: Positive for visual disturbance.  Respiratory: Positive for chest tightness.   Cardiovascular: Positive for chest pain.  Neurological: Positive for numbness and headaches. Negative for speech difficulty.       Objective:   Physical Exam  Constitutional: She is oriented to person, place, and time. She appears well-developed and well-nourished. No distress.  Speaking very softly and slightly slowed in speech but not slurring.   HENT:  Head: Normocephalic and atraumatic.  Eyes: Conjunctivae and EOM are normal. Pupils are equal, round, and reactive to light.  Neck: Neck supple. Carotid bruit is not present.  Cardiovascular: Normal rate, regular rhythm, normal heart sounds and intact distal pulses.   Pulmonary/Chest: Effort normal and breath sounds normal.  Abdominal: Soft. She exhibits no pulsatile midline mass. There is no tenderness.  Musculoskeletal:  No calf pain. No appreciable lower extremities edema bilaterally   Neurological: She is alert and oriented to person, place, and time. No cranial nerve deficit.  Equal grip strength bilaterally. Equal arm strength bilaterally. Equal strength in the lower extremities bilaterally.     Skin: Skin is warm and dry.  Psychiatric: She has a normal mood and affect. Her behavior is normal.  Nursing note and vitals reviewed.    Filed Vitals:   10/15/14 1451  BP: 156/97  Pulse: 64  Temp: 98.2 F (36.8 C)  TempSrc: Oral  Resp: 16  Height: 5' 5.75" (1.67 m)  Weight:  210 lb (95.255 kg)  SpO2: 97%    3:12 PM - EMS was called for transport. She was placed on a hear monitor. O2 on 2 LPM by [place].   Results for orders placed or performed in visit on 10/15/14  POCT glucose (manual entry)  Result Value Ref Range   POC Glucose 103 (A) 70 - 99 mg/dl   EKG sinus rhythm: rate 64, no appearrant acute findings compared to Jan 2016 EKG.   3:22 PM- Report given to EMS with transfer of care. 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      Assessment & Plan:  KHIA DIETERICH is a 64 y.o. female Acute confusional state - Plan: EKG 12-Lead  Dizziness - Plan: POCT glucose (manual entry), EKG 12-Lead  Left arm numbness  Chest tightness  Blurred vision, bilateral  Acute onset of mental status changes this morning at 5:30 AM. Associated with blurry vision, disorientation and difficulty with focus at work. She did initially complain of left arm numbness, tingling when arrived to our office, but on further discussion the symptoms been present for months and currently under the care of specialist with possible planned nerve conduction studies.  No focal weakness appreciated on exam, but she did have some what slowed speech although it was without slurring. EKG without any acute findings, glucose normal. On review of her medications she is on multiple sedating medications, but denies any new medicines or new doses recently, and has not had these symptoms with her medications in the past. EMS called for transport and further evaluation through the emergency room.   No orders of the defined types were placed in this encounter.   Patient Instructions  Sent to ER by EMS.    I personally performed the services described in this documentation, which was scribed in my presence. The recorded information has been reviewed and considered, and addended by me as needed.

## 2014-10-15 NOTE — ED Notes (Signed)
Pt in MRI.

## 2014-10-25 ENCOUNTER — Emergency Department (HOSPITAL_COMMUNITY): Payer: 59

## 2014-10-25 ENCOUNTER — Encounter (HOSPITAL_COMMUNITY): Payer: Self-pay | Admitting: *Deleted

## 2014-10-25 ENCOUNTER — Emergency Department (HOSPITAL_COMMUNITY)
Admission: EM | Admit: 2014-10-25 | Discharge: 2014-10-25 | Disposition: A | Discharge status: Home / Self Care | Payer: 59 | Attending: Emergency Medicine | Admitting: Emergency Medicine

## 2014-10-25 DIAGNOSIS — R6883 Chills (without fever): Secondary | ICD-10-CM | POA: Diagnosis not present

## 2014-10-25 DIAGNOSIS — Z87828 Personal history of other (healed) physical injury and trauma: Secondary | ICD-10-CM | POA: Insufficient documentation

## 2014-10-25 DIAGNOSIS — R0602 Shortness of breath: Secondary | ICD-10-CM | POA: Diagnosis not present

## 2014-10-25 DIAGNOSIS — R61 Generalized hyperhidrosis: Secondary | ICD-10-CM | POA: Diagnosis not present

## 2014-10-25 DIAGNOSIS — Z8639 Personal history of other endocrine, nutritional and metabolic disease: Secondary | ICD-10-CM | POA: Insufficient documentation

## 2014-10-25 DIAGNOSIS — R5383 Other fatigue: Secondary | ICD-10-CM | POA: Insufficient documentation

## 2014-10-25 DIAGNOSIS — R079 Chest pain, unspecified: Secondary | ICD-10-CM | POA: Diagnosis present

## 2014-10-25 DIAGNOSIS — R05 Cough: Secondary | ICD-10-CM | POA: Diagnosis not present

## 2014-10-25 DIAGNOSIS — H539 Unspecified visual disturbance: Secondary | ICD-10-CM | POA: Insufficient documentation

## 2014-10-25 DIAGNOSIS — R2243 Localized swelling, mass and lump, lower limb, bilateral: Secondary | ICD-10-CM | POA: Diagnosis not present

## 2014-10-25 DIAGNOSIS — Z872 Personal history of diseases of the skin and subcutaneous tissue: Secondary | ICD-10-CM | POA: Insufficient documentation

## 2014-10-25 DIAGNOSIS — R197 Diarrhea, unspecified: Secondary | ICD-10-CM | POA: Diagnosis not present

## 2014-10-25 DIAGNOSIS — Z7982 Long term (current) use of aspirin: Secondary | ICD-10-CM | POA: Insufficient documentation

## 2014-10-25 DIAGNOSIS — R42 Dizziness and giddiness: Secondary | ICD-10-CM | POA: Insufficient documentation

## 2014-10-25 DIAGNOSIS — G43909 Migraine, unspecified, not intractable, without status migrainosus: Secondary | ICD-10-CM | POA: Insufficient documentation

## 2014-10-25 DIAGNOSIS — I10 Essential (primary) hypertension: Secondary | ICD-10-CM | POA: Insufficient documentation

## 2014-10-25 DIAGNOSIS — M1990 Unspecified osteoarthritis, unspecified site: Secondary | ICD-10-CM | POA: Diagnosis not present

## 2014-10-25 DIAGNOSIS — Z792 Long term (current) use of antibiotics: Secondary | ICD-10-CM | POA: Insufficient documentation

## 2014-10-25 HISTORY — DX: Systemic involvement of connective tissue, unspecified: M35.9

## 2014-10-25 LAB — URINALYSIS, ROUTINE W REFLEX MICROSCOPIC
Bilirubin Urine: NEGATIVE
Glucose, UA: NEGATIVE mg/dL
Hgb urine dipstick: NEGATIVE
Ketones, ur: NEGATIVE mg/dL
Leukocytes, UA: NEGATIVE
Nitrite: NEGATIVE
Protein, ur: NEGATIVE mg/dL
Specific Gravity, Urine: 1.01 (ref 1.005–1.030)
Urobilinogen, UA: 0.2 mg/dL (ref 0.0–1.0)
pH: 7 (ref 5.0–8.0)

## 2014-10-25 LAB — CBC WITH DIFFERENTIAL/PLATELET
Basophils Absolute: 0 10*3/uL (ref 0.0–0.1)
Basophils Relative: 0 % (ref 0–1)
Eosinophils Absolute: 0.5 10*3/uL (ref 0.0–0.7)
Eosinophils Relative: 6 % — ABNORMAL HIGH (ref 0–5)
HCT: 41.8 % (ref 36.0–46.0)
Hemoglobin: 14 g/dL (ref 12.0–15.0)
Lymphocytes Relative: 25 % (ref 12–46)
Lymphs Abs: 1.9 10*3/uL (ref 0.7–4.0)
MCH: 29.7 pg (ref 26.0–34.0)
MCHC: 33.5 g/dL (ref 30.0–36.0)
MCV: 88.7 fL (ref 78.0–100.0)
Monocytes Absolute: 0.6 10*3/uL (ref 0.1–1.0)
Monocytes Relative: 8 % (ref 3–12)
Neutro Abs: 4.6 10*3/uL (ref 1.7–7.7)
Neutrophils Relative %: 61 % (ref 43–77)
Platelets: 240 10*3/uL (ref 150–400)
RBC: 4.71 MIL/uL (ref 3.87–5.11)
RDW: 14.1 % (ref 11.5–15.5)
WBC: 7.6 10*3/uL (ref 4.0–10.5)

## 2014-10-25 LAB — BASIC METABOLIC PANEL
Anion gap: 8 (ref 5–15)
BUN: 13 mg/dL (ref 6–20)
CO2: 30 mmol/L (ref 22–32)
Calcium: 9.4 mg/dL (ref 8.9–10.3)
Chloride: 100 mmol/L — ABNORMAL LOW (ref 101–111)
Creatinine, Ser: 1.11 mg/dL — ABNORMAL HIGH (ref 0.44–1.00)
GFR calc Af Amer: 60 mL/min — ABNORMAL LOW (ref >60)
GFR calc non Af Amer: 52 mL/min — ABNORMAL LOW (ref >60)
Glucose, Bld: 115 mg/dL — ABNORMAL HIGH (ref 65–99)
Potassium: 4.8 mmol/L (ref 3.5–5.1)
Sodium: 138 mmol/L (ref 135–145)

## 2014-10-25 LAB — D-DIMER, QUANTITATIVE (NOT AT ARMC): D-Dimer, Quant: 1.26 ug/mL-FEU — ABNORMAL HIGH (ref 0.00–0.48)

## 2014-10-25 LAB — I-STAT TROPONIN, ED: Troponin i, poc: 0 ng/mL (ref 0.00–0.08)

## 2014-10-25 MED ORDER — DOXYCYCLINE HYCLATE 100 MG PO TABS
100.0000 mg | ORAL_TABLET | Freq: Once | ORAL | Status: AC
  Administered 2014-10-25: 100 mg via ORAL
  Filled 2014-10-25: qty 1

## 2014-10-25 MED ORDER — DOXYCYCLINE HYCLATE 100 MG PO CAPS: 100.0000 mg | ORAL_CAPSULE | Freq: Two times a day (BID) | ORAL | Status: DC

## 2014-10-25 MED ORDER — IOHEXOL 350 MG/ML SOLN
75.0000 mL | Freq: Once | INTRAVENOUS | Status: AC | PRN
  Administered 2014-10-25: 75 mL via INTRAVENOUS

## 2014-10-25 MED ORDER — SODIUM CHLORIDE 0.9 % IV BOLUS (SEPSIS)
1000.0000 mL | Freq: Once | INTRAVENOUS | Status: AC
  Administered 2014-10-25: 1000 mL via INTRAVENOUS

## 2014-10-25 NOTE — ED Notes (Signed)
Pt does not want an IV at this time. Agreed to let blood work result before placing IV.

## 2014-10-25 NOTE — ED Provider Notes (Signed)
This is a 64 year old female comes in today with multiple complaints. She was seen here 10 days prior to evaluation and states that she was confused, weak, and dizzy at that time. During that evaluation she had an MRI of her brain which she not showing any evidence of stroke. She states that she continues to have weakness with her body "feeling heavy" every morning. She states that she has chronic venous insufficiency, but that her legs have swollen intermittently much more than normal. Upon direct questioning she states that she does have intermittent chest discomfort. She had repeat laboratory values done here. She also states that she has been evaluated for hypothyroidism emesis labs recently in June was borderline hypothyroid. She also states that she had a tick bite of her head 3 weeks ago which preceded the symptoms. Repeat lab evaluation here was normal except for an elevated d-dimer. She subsequently had a CT angiogram of her chest done that was normal. I had a long discussion with the patient and her husband who aren't demanding that she be admitted. I have advised them that we have no reason to admit her to the hospital. She states "fine I will just go home and die". I have advised her that we should have her primary care doctor involved. She states that her primary care doctor only spends 2 minutes with her. She states that the reason that her primary care doctor within 2 minutes was because the patient was late arriving due to traffic. She states that she has seen a vascular surgeon for her venous insufficiency. She asked me multiple questions about having clots where they could possibly go. She is concerned that there is a clot in her abdomen. I reassured her that her abdomen is soft and nontender she has normal labs here. Currently, the plan is to send [facility] spotted fever titers will treat her with doxycycline. She is referred to cardiology as an outpatient for her chest pain and we'll  attempt to find her a referral for new primary care as her current primary care is at [facility].   EKG Interpretation  Date/Time:  Sunday October 25 2014 09:20:07 EDT Ventricular Rate:  69 PR Interval:  154 QRS Duration: 88 QT Interval:  366 QTC Calculation: 392 R Axis:   65 Text Interpretation:  Normal sinus rhythm Normal ECG Confirmed by Sharel Behne MD, Andee Poles ([id number]) on 10/25/2014 12:25:34 PM       I performed a history and physical examination of [name] and discussed her management with [name].  I agree with the history, physical, assessment, and plan of care, with the following exceptions: None  I was present for the following procedures: None Time Spent in Critical Care of the patient: None Time spent in discussions with the patient and family: 60  Sincere Berlanga Shaune Pollack, MD 10/25/14 1339

## 2014-10-25 NOTE — ED Provider Notes (Signed)
CSN: [id number]     Arrival date & time 10/25/14  0915 History  This chart was scribed for Ottie Glazier, PA-C, working with Pattricia Boss, MD by Steva Colder, ED Scribe. The patient was seen in room A01C/A01C at 9:35 AM.    Chief complain: Patient presents with dizziness    The history is provided by the patient. No language interpreter was used.    [name] is a 64 y.o. female with a medical hx of HTN, hyperlipidemia, and SVT, who presents to the Emergency Department complaining of dizziness and diaphorese onset this morning. Pt was recently seen in the ED on 10/15/14 for AMS. Husband notes that she was at work and dizzy and lightheaded and while at a stop light she didn't know how she got there. Pt was seen in the Urgent Care and was pallor and transferred to the ED and was informed to come for an ECG, that she is trying to get scheduled for follow up with her pcp in [facility]. Pt thinks that she is high risk for a DVT and its because she had a spinal cord injury. Pt has not had a DVT ever and she is not on estrogen. Pt does take 81 mg ASA daily. Pt is having associated symptoms of productive cough, swelling to bilateral ankles, fatigue, dizziness, SOB, diaphoresis/clammy, blurred vision, bilateral leg pain, chills, non-bloody watery diarrhea x 6 episodes. She denies color change, wound, abdominal pain, rash, fever, and any other symptoms. Pt is a non-smoker although her mother has had an aortic aneurysm and her father had heart disease. Pt is not a diabetic. Pt reports that she takes her blood pressure medications as prescribed.   Past Medical History  Diagnosis Date  . Hypertension   . Migraine headache     hormonal related - less frequent off hormone replacement  . Synovial cyst of lumbar facet joint     lower back - s/p surgery   . Derangement of knee, left 2009    started with fall at work. Has had repair patella and torn meniscus  . Migraines   . Hx of  appendectomy   . High cholesterol   . Allergy   . Arthritis   . Neuropathy 04/23/2013  . Psoriasis 04/23/2013  . Deformity of joint 04/23/2013  . SVT (supraventricular tachycardia)   . Raynaud disease   . Collagen vascular disease    Past Surgical History  Procedure Laterality Date  . Ovarian cyst surgery  1972  . Appendectomy  1971  . Cervical discectomy  1999    diskectomy with fixation:plate and screws  . Synovial cyst excision  2007    lumbar spine  . Patellar reefing  '09    repair of fractured patella after fall  . Knee arthroscopy w/ meniscal repair  09    left knee  . Venous ablation      for pain in the groin - VVTS did procedure. Has some residual discomfort at the  ablation site.   . L4-5 lumbar fusion  September 02, 2010    Dr. Hal Neer:  minimally invasive transforaminal interbody fusion with spacer  . Spine surgery     Family History  Problem Relation Age of Onset  . Diabetes Mother   . COPD Mother   . Aortic aneurysm Mother   . Hypertension Mother   . Cancer Father     lung  . Hyperlipidemia Father   . Hypertension Father   . Heart disease Father   .  Diabetes Father   . Diabetes Brother   . Hypothyroidism Mother    Social History  Substance Use Topics  . Smoking status: Never Smoker   . Smokeless tobacco: Never Used  . Alcohol Use: No   OB History    No data available     Review of Systems  Constitutional: Positive for chills and diaphoresis (clammy). Negative for fever.  Eyes: Positive for visual disturbance.  Respiratory: Positive for cough and shortness of breath.   Cardiovascular: Positive for chest pain.  Gastrointestinal: Positive for diarrhea. Negative for nausea, vomiting, abdominal pain and blood in stool.  Musculoskeletal: Positive for joint swelling (bilateral ankles). Negative for myalgias and arthralgias.  Skin: Negative for color change, rash and wound.  Neurological: Negative for dizziness and light-headedness.  All other systems  reviewed and are negative.     Allergies  Lyrica; Lisinopril; Novocain; Nsaids; Prednisone; Tape; Caine-1; Celebrex; Codeine; Nephron; Other; Procaine hcl; and Sulfa antibiotics  Home Medications   Prior to Admission medications   Medication Sig Start Date End Date Taking? Authorizing Provider  acetaminophen (TYLENOL) 500 MG tablet Take 500 mg by mouth every 6 (six) hours as needed for mild pain, moderate pain or headache.    Yes Historical Provider, MD  amitriptyline (ELAVIL) 10 MG tablet Take 4 tablets (40 mg total) by mouth at bedtime. Patient taking differently: Take 20 mg by mouth at bedtime.    Yes Hendricks Limes, MD  aspirin EC 81 MG tablet Take 81 mg by mouth every other day.    Yes Historical Provider, MD  B Complex Vitamins (VITAMIN B COMPLEX PO) Take 1 tablet by mouth daily.    Yes Historical Provider, MD  calcium gluconate 500 MG tablet Take 1 tablet by mouth daily.    Yes Historical Provider, MD  clonazePAM (KLONOPIN) 1 MG tablet Take 1 tablet (1 mg total) by mouth at bedtime as needed for anxiety. Patient taking differently: Take 1 mg by mouth at bedtime.    Yes Neena Rhymes, MD  esomeprazole (NEXIUM) 40 MG capsule Take 1 capsule (40 mg total) by mouth daily at 12 noon. Patient taking differently: Take 40 mg by mouth at bedtime.  04/15/14  Yes Posey Boyer, MD  HYDROcodone-acetaminophen (NORCO/VICODIN) 5-325 MG per tablet Take 1-2 tablets by mouth every 6 (six) hours as needed for moderate pain.   Yes Historical Provider, MD  losartan (COZAAR) 25 MG tablet Take 25 mg by mouth daily.    Yes Historical Provider, MD  magnesium gluconate (MAGONATE) 500 MG tablet Take 500 mg by mouth daily.    Yes Historical Provider, MD  metoprolol tartrate (LOPRESSOR) 25 MG tablet Take 12.5 mg by mouth 2 (two) times daily.   Yes Historical Provider, MD  Multiple Vitamin (MULTIVITAMIN WITH MINERALS) TABS Take 1 tablet by mouth daily.   Yes Historical Provider, MD  naproxen sodium (ANAPROX)  220 MG tablet Take 440 mg by mouth 2 (two) times daily as needed (pain).   Yes Historical Provider, MD  OMEGA-3 FATTY ACIDS PO Take 1 g by mouth.   Yes Historical Provider, MD  sucralfate (CARAFATE) 1 G tablet Take 1 g by mouth daily. 09/03/14  Yes Historical Provider, MD  tiZANidine (ZANAFLEX) 2 MG tablet Take 2 mg by mouth 2 (two) times daily.    Yes Historical Provider, MD  vitamin E 400 UNIT capsule Take 400 Units by mouth every other day.    Yes Historical Provider, MD  doxycycline (VIBRAMYCIN) 100 MG capsule  Take 1 capsule (100 mg total) by mouth 2 (two) times daily. 10/25/14   Bonnell Placzek Patel-Mills, PA-C  hydrochlorothiazide (HYDRODIURIL) 25 MG tablet Take 1 tablet (25 mg total) by mouth daily. Patient not taking: Reported on 10/15/2014 04/08/13   Neena Rhymes, MD  ipratropium (ATROVENT) 0.03 % nasal spray Place 2 sprays into the nose 4 (four) times daily. Patient not taking: Reported on 10/15/2014 03/09/13   Shawnee Knapp, MD  pantoprazole (PROTONIX) 40 MG tablet Take 1 tablet (40 mg total) by mouth daily. PATIENT NEEDS OFFICE VISIT/FASTING LABS FOR ADDITIONAL REFILLS Patient not taking: Reported on 10/15/2014 05/29/14   Tereasa Coop, PA-C   BP 129/58 mmHg  Pulse 58  Temp(Src) 98 F (36.7 C) (Oral)  Resp 15  SpO2 97% Physical Exam  Constitutional: She is oriented to person, place, and time. She appears well-developed and well-nourished. No distress.  HENT:  Head: Normocephalic and atraumatic.  Eyes: EOM are normal.  Neck: Neck supple. No tracheal deviation present.  Cardiovascular: Normal rate, regular rhythm, normal heart sounds and intact distal pulses.  Exam reveals no gallop and no friction rub.   No murmur heard. Pulmonary/Chest: Effort normal and breath sounds normal. No respiratory distress. She has no decreased breath sounds. She has no wheezes. She has no rales.  Abdominal: Soft. She exhibits no distension. There is no tenderness. There is no rebound and no guarding.   Musculoskeletal: Normal range of motion. She exhibits no edema or tenderness.  No edema or tenderness to bilateral lower legs.  Neurological: She is alert and oriented to person, place, and time. She has normal strength. No sensory deficit. She displays a negative Romberg sign. Coordination and gait normal. GCS eye subscore is 4. GCS verbal subscore is 5. GCS motor subscore is 6.  Cranial nerves III through XII intact.  Skin: Skin is warm and dry.  Psychiatric: She has a normal mood and affect. Her behavior is normal.  Nursing note and vitals reviewed.   ED Course  Procedures (including critical care time) DIAGNOSTIC STUDIES: Oxygen Saturation is 95% on RA, adequate by my interpretation.    COORDINATION OF CARE: 9:49 AM Discussed treatment plan with pt at bedside and pt agreed to plan.  9:52 AM- Per chart review on 10/21/14: Patient had normal MRI of brain, CT of head, CXR, EKG, and unremarkable labs.   Labs Review Labs Reviewed  CBC WITH DIFFERENTIAL/PLATELET - Abnormal; Notable for the following:    Eosinophils Relative 6 (*)    All other components within normal limits  BASIC METABOLIC PANEL - Abnormal; Notable for the following:    Chloride 100 (*)    Glucose, Bld 115 (*)    Creatinine, Ser 1.11 (*)    GFR calc non Af Amer 52 (*)    GFR calc Af Amer 60 (*)    All other components within normal limits  D-DIMER, QUANTITATIVE (NOT AT [facility]) - Abnormal; Notable for the following:    D-Dimer, Quant 1.26 (*)    All other components within normal limits  URINALYSIS, ROUTINE W REFLEX MICROSCOPIC (NOT AT [facility])  LYME DISEASE DNA BY PCR(BORRELIA BURG)  B. BURGDORFI ANTIBODIES  ROCKY MTN SPOTTED FVR ABS PNL(IGG+IGM)  I-STAT TROPOININ, ED    Imaging Review Dg Chest 2 View  10/25/2014   CLINICAL DATA:  Right side chest pain  EXAM: CHEST  2 VIEW  COMPARISON:  10/15/2014  FINDINGS: The heart size and mediastinal contours are within normal limits. Both lungs are clear. The  visualized  skeletal structures are unremarkable.  IMPRESSION: No active cardiopulmonary disease.   Electronically Signed   By: Lahoma Crocker M.D.   On: 10/25/2014 11:06   Ct Angio Chest Pe W/cm &/or Wo Cm  10/25/2014   CLINICAL DATA:  64 year old female with fatigue a, dizziness and mid chest pain, with productive cough and ankle swelling.  EXAM: CT ANGIOGRAPHY CHEST WITH CONTRAST  TECHNIQUE: Multidetector CT imaging of the chest was performed using the standard protocol during bolus administration of intravenous contrast. Multiplanar CT image reconstructions and MIPs were obtained to evaluate the vascular anatomy.  CONTRAST:  48mL OMNIPAQUE IOHEXOL 350 MG/ML SOLN  COMPARISON:  Chest CT 01/12/2011.  FINDINGS: Mediastinum/Lymph Nodes: There are no filling defects within the pulmonary arterial tree to suggest underlying pulmonary embolism. Heart size is normal. There is no significant pericardial fluid, thickening or pericardial calcification. There is a small 2.2 x 1.6 cm well-defined low-attenuation collection adjacent to the right inferior pulmonary vein (image 48 of series 4), which is unchanged in retrospect compared to prior study 01/12/2011, favored to represent a small pericardial recess or tiny pericardial cyst (this is a benign finding). No pathologically enlarged mediastinal or hilar lymph nodes. Esophagus is unremarkable in appearance. No axillary lymphadenopathy.  Lungs/Pleura: Areas of mild scarring noted in the left lower lobe, similar to prior study 01/12/2011. No consolidative airspace disease. No pleural effusions. No suspicious appearing pulmonary nodules or masses.  Upper Abdomen: Atherosclerosis.  Musculoskeletal/Soft Tissues: There are no aggressive appearing lytic or blastic lesions noted in the visualized portions of the skeleton. Orthopedic fixation hardware in the lower cervical spine.  Review of the MIP images confirms the above findings.  IMPRESSION: 1. No evidence of pulmonary embolism. 2. No acute  findings in the thorax to account for the patient's symptoms. 3. Incidental findings, as above.   Electronically Signed   By: Vinnie Langton M.D.   On: 10/25/2014 12:10   I, Patel-Mills, Orvil Feil, personally reviewed and evaluated these images and lab results as part of my medical decision-making.   EKG Interpretation   Date/Time:  Sunday October 25 2014 09:20:07 EDT Ventricular Rate:  69 PR Interval:  154 QRS Duration: 88 QT Interval:  366 QTC Calculation: 392 R Axis:   65 Text Interpretation:  Normal sinus rhythm Normal ECG Confirmed by RAY MD,  Andee Poles ([id number]) on 10/25/2014 12:25:34 PM      MDM   Final diagnoses:  Dizziness   Patient presents for dizziness. She was seen here 10 days ago for the same. She is well-appearing and in no acute distress. Vitals are stable and have remained stable throughout her stay in the ED. Due to her complaint of persistent chest pain since her discharge 10 days ago I obtained a d-dimer which was positive. CTA chest was negative for PE. Her labs were not concerning. Troponin negative. EKG was normal. I spoke to Dr. Jeanell Sparrow regarding the patient's request to speak to an attending. I discussed with the patient that there was no reason for admission and that she could be followed up outpatient with a cardiologist as well as PCP. She stated that her PCP was in [place] and could not get an appointment for several months. Dr. Jeanell Sparrow was able to speak to the patient at which time the patient explained she had a tick bite on her head. She was treated with doxycycline prophylactically and labs were ordered. I also gave her [facility] health cardiology and a PCP follow-up in [facility]. Return precautions were  also discussed.  Medications  sodium chloride 0.9 % bolus 1,000 mL (0 mLs Intravenous Stopped 10/25/14 1209)  iohexol (OMNIPAQUE) 350 MG/ML injection 75 mL (75 mLs Intravenous Contrast Given 10/25/14 1137)  doxycycline (VIBRA-TABS) tablet 100 mg (100 mg Oral Given  10/25/14 1351)     Ottie Glazier, PA-C 10/25/14 1441  Pattricia Boss, MD 10/25/14 [phone]

## 2014-10-25 NOTE — Discharge Instructions (Signed)
Dizziness   Dizziness means you feel unsteady or lightheaded. You might feel like you are going to pass out (faint).  HOME CARE   · Drink enough fluids to keep your pee (urine) clear or pale yellow.  · Take your medicines exactly as told by your doctor. If you take blood pressure medicine, always stand up slowly from the lying or sitting position. Hold on to something to steady yourself.  · If you need to stand in one place for a long time, move your legs often. Tighten and relax your leg muscles.  · Have someone stay with you until you feel okay.  · Do not drive or use heavy machinery if you feel dizzy.  · Do not drink alcohol.  GET HELP RIGHT AWAY IF:   · You feel dizzy or lightheaded and it gets worse.  · You feel sick to your stomach (nauseous), or you throw up (vomit).  · You have trouble talking or walking.  · You feel weak or have trouble using your arms, hands, or legs.  · You cannot think clearly or have trouble forming sentences.  · You have chest pain, belly (abdominal) pain, sweating, or you are short of breath.  · Your vision changes.  · You are bleeding.  · You have problems from your medicine that seem to be getting worse.  MAKE SURE YOU:   · Understand these instructions.  · Will watch your condition.  · Will get help right away if you are not doing well or get worse.  Document Released: 02/16/2011 Document Revised: 05/22/2011 Document Reviewed: 02/16/2011  ExitCare® Patient Information ©2015 [employer]. This information is not intended to replace advice given to you by your health care provider. Make sure you discuss any questions you have with your health care provider.

## 2014-10-25 NOTE — ED Notes (Addendum)
Pt reports being here recently for [facility], now pt having fatigue, dizziness and mid chest pain. Reports having productive cough and swelling to ankles. ekg done at triage.

## 2014-10-26 ENCOUNTER — Other Ambulatory Visit (HOSPITAL_COMMUNITY): Payer: Self-pay | Admitting: Internal Medicine

## 2014-10-26 ENCOUNTER — Other Ambulatory Visit: Payer: Self-pay | Admitting: Orthopaedic Surgery

## 2014-10-26 DIAGNOSIS — R410 Disorientation, unspecified: Secondary | ICD-10-CM

## 2014-10-26 DIAGNOSIS — R002 Palpitations: Secondary | ICD-10-CM

## 2014-10-26 DIAGNOSIS — R079 Chest pain, unspecified: Secondary | ICD-10-CM

## 2014-10-26 DIAGNOSIS — M48061 Spinal stenosis, lumbar region without neurogenic claudication: Secondary | ICD-10-CM

## 2014-10-26 LAB — ROCKY MTN SPOTTED FVR ABS PNL(IGG+IGM)
RMSF IgG: NEGATIVE
RMSF IgM: 0.64 index (ref 0.00–0.89)

## 2014-10-26 LAB — B. BURGDORFI ANTIBODIES: B burgdorferi Ab IgG+IgM: <0.91 {ISR} (ref 0.00–0.90)

## 2014-10-27 ENCOUNTER — Ambulatory Visit (HOSPITAL_BASED_OUTPATIENT_CLINIC_OR_DEPARTMENT_OTHER)
Admission: RE | Admit: 2014-10-27 | Discharge: 2014-10-27 | Disposition: A | Discharge status: Home / Self Care | Payer: 59 | Source: Ambulatory Visit | Attending: Orthopaedic Surgery | Admitting: Orthopaedic Surgery

## 2014-10-27 ENCOUNTER — Ambulatory Visit (HOSPITAL_COMMUNITY)
Admission: RE | Admit: 2014-10-27 | Discharge: 2014-10-27 | Disposition: A | Discharge status: Home / Self Care | Payer: 59 | Source: Ambulatory Visit | Attending: Orthopaedic Surgery | Admitting: Orthopaedic Surgery

## 2014-10-27 DIAGNOSIS — E780 Pure hypercholesterolemia: Secondary | ICD-10-CM | POA: Diagnosis not present

## 2014-10-27 DIAGNOSIS — R410 Disorientation, unspecified: Secondary | ICD-10-CM | POA: Diagnosis not present

## 2014-10-27 DIAGNOSIS — R079 Chest pain, unspecified: Secondary | ICD-10-CM | POA: Diagnosis not present

## 2014-10-27 DIAGNOSIS — R002 Palpitations: Secondary | ICD-10-CM | POA: Insufficient documentation

## 2014-10-27 DIAGNOSIS — I10 Essential (primary) hypertension: Secondary | ICD-10-CM | POA: Diagnosis not present

## 2014-10-27 DIAGNOSIS — I6523 Occlusion and stenosis of bilateral carotid arteries: Secondary | ICD-10-CM | POA: Diagnosis not present

## 2014-10-27 NOTE — Progress Notes (Signed)
Echocardiogram 2D Echocardiogram has been performed.  [name] 10/27/2014, 2:40 PM

## 2014-10-27 NOTE — Progress Notes (Signed)
*  PRELIMINARY RESULTS* Vascular Ultrasound Carotid Duplex (Doppler) has been completed.   Findings suggest 1-39% internal carotid artery stenosis bilaterally. Vertebral arteries are patent with antegrade flow.  10/27/2014 2:55 PM Maudry Mayhew, RVT, RDCS, RDMS

## 2014-11-02 ENCOUNTER — Telehealth (HOSPITAL_COMMUNITY): Payer: Self-pay

## 2014-11-20 ENCOUNTER — Inpatient Hospital Stay
Admission: RE | Admit: 2014-11-20 | Discharge: 2014-11-20 | Disposition: A | Discharge status: Home / Self Care | Payer: Self-pay | Source: Ambulatory Visit | Attending: Orthopaedic Surgery | Admitting: Orthopaedic Surgery

## 2014-11-20 ENCOUNTER — Ambulatory Visit
Admission: RE | Admit: 2014-11-20 | Discharge: 2014-11-20 | Disposition: A | Discharge status: Home / Self Care | Payer: 59 | Source: Ambulatory Visit | Attending: Orthopaedic Surgery | Admitting: Orthopaedic Surgery

## 2014-11-20 ENCOUNTER — Other Ambulatory Visit: Payer: Self-pay | Admitting: Orthopaedic Surgery

## 2014-11-20 DIAGNOSIS — M48061 Spinal stenosis, lumbar region without neurogenic claudication: Secondary | ICD-10-CM

## 2014-11-20 DIAGNOSIS — M5441 Lumbago with sciatica, right side: Secondary | ICD-10-CM

## 2014-11-20 MED ORDER — ONDANSETRON HCL 4 MG/2ML IJ SOLN: 4.0000 mg | Freq: Four times a day (QID) | INTRAMUSCULAR | Status: DC | PRN

## 2014-11-20 MED ORDER — IOHEXOL 180 MG/ML  SOLN
15.0000 mL | Freq: Once | INTRAMUSCULAR | Status: DC | PRN
  Administered 2014-11-20: 15 mL via INTRATHECAL

## 2014-11-20 MED ORDER — DIAZEPAM 5 MG PO TABS
10.0000 mg | ORAL_TABLET | Freq: Once | ORAL | Status: AC
  Administered 2014-11-20: 10 mg via ORAL

## 2014-11-20 NOTE — Discharge Instructions (Signed)
Myelogram Discharge Instructions  1. Go home and rest quietly for the next 24 hours.  It is important to lie flat for the next 24 hours.  Get up only to go to the restroom.  You may lie in the bed or on a couch on your back, your stomach, your left side or your right side.  You may have one pillow under your head.  You may have pillows between your knees while you are on your side or under your knees while you are on your back.  2. DO NOT drive today.  Recline the seat as far back as it will go, while still wearing your seat belt, on the way home.  3. You may get up to go to the bathroom as needed.  You may sit up for 10 minutes to eat.  You may resume your normal diet and medications unless otherwise indicated.  Drink plenty of extra fluids today and tomorrow.  4. The incidence of a spinal headache with nausea and/or vomiting is about 5% (one in 20 patients).  If you develop a headache, lie flat and drink plenty of fluids until the headache goes away.  Caffeinated beverages may be helpful.  If you develop severe nausea and vomiting or a headache that does not go away with flat bed rest, call [phone].  5. You may resume normal activities after your 24 hours of bed rest is over; however, do not exert yourself strongly or do any heavy lifting tomorrow.  6. Call your physician for a follow-up appointment.   You may resume Amitriptyline on Saturday, November 21, 2014 after 9:30a.m.

## 2014-12-12 HISTORY — PX: ESOPHAGOGASTRODUODENOSCOPY: SHX1529

## 2015-01-13 ENCOUNTER — Ambulatory Visit (INDEPENDENT_AMBULATORY_CARE_PROVIDER_SITE_OTHER): Payer: 59 | Admitting: Family Medicine

## 2015-01-13 VITALS — BP 122/77 | HR 75 | Temp 98.2°F | Resp 16 | Ht 65.0 in | Wt 220.2 lb

## 2015-01-13 DIAGNOSIS — R05 Cough: Secondary | ICD-10-CM

## 2015-01-13 DIAGNOSIS — R059 Cough, unspecified: Secondary | ICD-10-CM

## 2015-01-13 DIAGNOSIS — J3489 Other specified disorders of nose and nasal sinuses: Secondary | ICD-10-CM

## 2015-01-13 MED ORDER — DOXYCYCLINE HYCLATE 100 MG PO CAPS: 100.0000 mg | ORAL_CAPSULE | Freq: Two times a day (BID) | ORAL | Status: DC

## 2015-01-13 NOTE — Patient Instructions (Signed)
Please take mucinex 12 hour twice a day for 3 days at least Add an antihistamine - over the counter zyrtec, claritin or allegra (generic fine) for 3 days at least Drink 8 glasses of water every day, avoid drinking your calories- eat fruit instead of drinking juice.

## 2015-01-13 NOTE — Progress Notes (Signed)
Subjective:    Patient ID: [name], female    DOB: 11/29/1950, 64 y.o.   MRN: [id number]  HPI This is a pleasant 64 yo female who sees  PCP in [place] with [place].  Patient presents today with 2 week history of cough with yellow sputum. She coughs during the night intermittently and early in the morning upon awakening. She is scheduled to have orthopedic surgery in about 2 weeks and wants anything cleared up prior. She was seen by her PCP earlier in October and was given Azithromycin which she finished. It gave her diarrhea. She had resolution of most of her symptoms, but continues with lingering, intermittent cough. Does not interrupt sleep. Poor water intake.   Has taken benadryl in the past, but not any antihistamines in the past. Has taken some Mucinex, not sure which kind.   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 [place] about a month ago when she tripped on the edge of a rug. No injury. Swelling goes down during the night and if she keeps her feet elevated. She has chronic venous insufficiency.   Past Medical History  Diagnosis Date  . Hypertension   . Migraine headache     hormonal related - less frequent off hormone replacement  . Synovial cyst of lumbar facet joint     lower back - s/p surgery   . Derangement of knee, left 2009    started with fall at work. Has had repair patella and torn meniscus  . Migraines   . Hx of appendectomy   . High cholesterol   . Allergy   . Arthritis   . Neuropathy 04/23/2013  . Psoriasis 04/23/2013  . Deformity of joint 04/23/2013  . SVT (supraventricular tachycardia)   . Raynaud disease   . Collagen vascular disease    Past Surgical History  Procedure Laterality Date  . Ovarian cyst surgery  1972  . Appendectomy  1971  . Cervical discectomy  1999    diskectomy with fixation:plate and screws  . Synovial cyst excision  2007    lumbar spine  . Patellar reefing  '09   repair of fractured patella after fall  . Knee arthroscopy w/ meniscal repair  09    left knee  . Venous ablation      for pain in the groin - VVTS did procedure. Has some residual discomfort at the  ablation site.   . L4-5 lumbar fusion  September 02, 2010    Dr. Hal Neer:  minimally invasive transforaminal interbody fusion with spacer  . Spine surgery     Family History  Problem Relation Age of Onset  . Diabetes Mother   . COPD Mother   . Aortic aneurysm Mother   . Hypertension Mother   . Cancer Father     lung  . Hyperlipidemia Father   . Hypertension Father   . Heart disease Father   . Diabetes Father   . Diabetes Brother   . Hypothyroidism Mother    Social History  Substance Use Topics  . Smoking status: Never Smoker   . Smokeless tobacco: Never Used  . Alcohol Use: No     Review of Systems No fever or chills, no wheezing, some rhinorrhea, some post nasal drainage,+ ear pressure, no SOB. No chest pain. No DOE, no PND.    Objective:   Physical Exam Physical Exam  Constitutional: Oriented to person, place, and time. She appears well-developed and  well-nourished.  HENT:  Head: Normocephalic and atraumatic.  Eyes: Conjunctivae are normal.  Neck: Normal range of motion. Neck supple.  Cardiovascular: Normal rate, regular rhythm and normal heart sounds.  Pulmonary/Chest: Effort normal and breath sounds normal.  Musculoskeletal: Normal range of motion. No pedal edema.  Neurological: Alert and oriented to person, place, and time.  Skin: Skin is warm and dry.  Psychiatric: Normal mood and affect. Behavior is normal. Judgment and thought content normal.  Vitals reviewed.  BP 122/77 mmHg  Pulse 75  Temp(Src) 98.2 F (36.8 C) (Oral)  Resp 16  Ht 5\' 5"  (1.651 m)  Wt 220 lb 3.2 oz (99.882 kg)  BMI 36.64 kg/m2 Wt Readings from Last 3 Encounters:  01/13/15 220 lb 3.2 oz (99.882 kg)  10/15/14 190 lb (86.183 kg)  10/15/14 210 lb (95.255 kg)      Assessment & Plan:  1.  Cough - suspect this is post viral or allergic in nature. Patient very insistent on being free of any infection before surgery. I have reassured her regarding normal lung exam, and suggested symptomatic treatment - she was given a wait and see antibiotic prescription to use if she is not improved in 2-3 days with symptomatic treatment.  - doxycycline (VIBRAMYCIN) 100 MG capsule; Take 1 capsule (100 mg total) by mouth 2 (two) times daily.  Dispense: 14 capsule; Refill: 0  2. Nasal drainage - suggested an OTC long acting antihistamine for a couple of days to decrease drainage  - RTC precautions given  Clarene Reamer, FNP-BC  [facility]  01/15/2015 6:12 PM

## 2015-01-14 ENCOUNTER — Telehealth: Payer: Self-pay

## 2015-01-14 NOTE — Telephone Encounter (Signed)
Pt states she saw debbie gessner yesterday and they discussed her getting the pnemonia shot and she states that she did not get it -she called today to confirm that it was documented and charged before she came back since she lives in [place], but when 104 reviewed everything from yesterday there was no documentation or charge for one-pt says that since they discussed this she feels that debbie should correct this so she does not have to come in and be seen again that because she discussed it she sound not have to be seen for this   Best number [phone]

## 2015-01-15 ENCOUNTER — Encounter: Payer: Self-pay | Admitting: Family Medicine

## 2015-01-15 NOTE — Telephone Encounter (Signed)
Called patient regarding her pneumonia vaccine. She had Prevnar 13 about 2 months ago (9/16), so I explained to her that it is too early to get the PPSV23, that she can have that 6-12 months following Prevnar administration. She reports that her cough is slowly improving.

## 2015-04-05 ENCOUNTER — Ambulatory Visit (INDEPENDENT_AMBULATORY_CARE_PROVIDER_SITE_OTHER): Payer: BLUE CROSS/BLUE SHIELD | Admitting: Family Medicine

## 2015-04-05 VITALS — BP 130/80 | HR 83 | Temp 98.0°F | Resp 20 | Wt 221.0 lb

## 2015-04-05 DIAGNOSIS — I10 Essential (primary) hypertension: Secondary | ICD-10-CM

## 2015-04-05 DIAGNOSIS — Z79899 Other long term (current) drug therapy: Secondary | ICD-10-CM | POA: Diagnosis not present

## 2015-04-05 DIAGNOSIS — R002 Palpitations: Secondary | ICD-10-CM

## 2015-04-05 DIAGNOSIS — H9201 Otalgia, right ear: Secondary | ICD-10-CM | POA: Diagnosis not present

## 2015-04-05 DIAGNOSIS — R232 Flushing: Secondary | ICD-10-CM

## 2015-04-05 DIAGNOSIS — R350 Frequency of micturition: Secondary | ICD-10-CM | POA: Diagnosis not present

## 2015-04-05 DIAGNOSIS — R42 Dizziness and giddiness: Secondary | ICD-10-CM | POA: Diagnosis not present

## 2015-04-05 LAB — POCT URINALYSIS DIP (MANUAL ENTRY)
Bilirubin, UA: NEGATIVE
Glucose, UA: NEGATIVE
Ketones, POC UA: NEGATIVE
Nitrite, UA: NEGATIVE
Protein Ur, POC: NEGATIVE
Spec Grav, UA: <=1.005
Urobilinogen, UA: 0.2
pH, UA: 5

## 2015-04-05 LAB — POCT CBC
Granulocyte percent: 58.5 %G (ref 37–80)
HCT, POC: 43.8 % (ref 37.7–47.9)
Hemoglobin: 14.9 g/dL (ref 12.2–16.2)
Lymph, poc: 3.1 (ref 0.6–3.4)
MCH, POC: 28.9 pg (ref 27–31.2)
MCHC: 33.9 g/dL (ref 31.8–35.4)
MCV: 85.2 fL (ref 80–97)
MID (cbc): 0.7 (ref 0–0.9)
MPV: 6.8 fL (ref 0–99.8)
POC Granulocyte: 5.4 (ref 2–6.9)
POC LYMPH PERCENT: 34.2 %L (ref 10–50)
POC MID %: 7.3 %M (ref 0–12)
Platelet Count, POC: 314 10*3/uL (ref 142–424)
RBC: 5.14 M/uL (ref 4.04–5.48)
RDW, POC: 15 %
WBC: 9.2 10*3/uL (ref 4.6–10.2)

## 2015-04-05 LAB — POC MICROSCOPIC URINALYSIS (UMFC): Mucus: ABSENT

## 2015-04-05 LAB — POCT SEDIMENTATION RATE: POCT SED RATE: 20 mm/hr (ref 0–22)

## 2015-04-05 LAB — POCT GLYCOSYLATED HEMOGLOBIN (HGB A1C): Hemoglobin A1C: 5.7

## 2015-04-05 LAB — GLUCOSE, POCT (MANUAL RESULT ENTRY): POC Glucose: 104 mg/dl — AB (ref 70–99)

## 2015-04-05 NOTE — Patient Instructions (Signed)
I will let you know the results of your labs in the next few days. In the meanwhile take it easy for the next few days and see how you do. I suspect you may have a viral infection causing her to feel so bad.  Continue to try and get a good night sleep  When possible increase exercise. The water aerobics is a good thing for you.  Return if worse or not improving  I recommend that you discuss with your primary doctor whether you can wean off any of the medication safely, or at least cut back on them.

## 2015-04-05 NOTE — Progress Notes (Signed)
Patient ID: [name], female    DOB: [birth year]  Age: 65 y.o. MRN: [id number]  Chief Complaint  Patient presents with  . Other    red sopt in face x 2 days  . Dizziness    x 1 week, weaknessin the both hands  . Headache  . Cyst    neck   . Ear Pain    Subjective:   65 year old lady who is here with a number of symptoms. She is not feeling well. She has had a rough stretch of time with several back operations. She hasn't been able to be as active, and has noticed that she has put on weight. About a week ago she began trying to reduce her carbohydrates to see if she could control that. She could hear the pulsations in her right ear last night. She's had some dizziness. She got very flushed in her face today, her husband showed me a picture of it. She felt feverish at that time her temperature was only 97. She feels pretty run down. She has had some discomfort in her right ear. She has a history of having some episodes of SVT but the EP study was normal on her. She feels bloated in her upper abdomen. She has a long list of medications. She says despite the she is not able to sleep. She is taking Klonopin and gabapentin and amitriptyline and tizanidine. She is on blood pressure medications. She has a hiatal hernia is on a PPI. She wonders whether she could have diabetes she sees a doctor and [place] primarily, and has had some mild abnormality of her thyroid which is not receive treatment because it didn't reach the need for that yet.  Current allergies, medications, problem list, past/family and social histories reviewed.  Objective:  BP 130/80 mmHg  Pulse 83  Temp(Src) 98 F (36.7 C) (Oral)  Resp 20  Wt 221 lb (100.245 kg)  SpO2 97%  No major acute distress. Cheeks are a little bit flushed but not likely were in the picture. Her vitals are stable. TMs normal. Eyes PERRLA. Throat clear. No erythema. Neck supple without significant nodes except for some right side  of the neck, sternocleidomastoid where she has had some cyst or scarring that is been evaluated. Nothing definitive found. Her chest is clear to auscultation. Heart regular without any murmurs or ectopy. Abdomen soft and nontender.  Results for orders placed or performed in visit on 04/05/15  POCT CBC  Result Value Ref Range   WBC 9.2 4.6 - 10.2 K/uL   Lymph, poc 3.1 0.6 - 3.4   POC LYMPH PERCENT 34.2 10 - 50 %L   MID (cbc) 0.7 0 - 0.9   POC MID % 7.3 0 - 12 %M   POC Granulocyte 5.4 2 - 6.9   Granulocyte percent 58.5 37 - 80 %G   RBC 5.14 4.04 - 5.48 M/uL   Hemoglobin 14.9 12.2 - 16.2 g/dL   HCT, POC 43.8 37.7 - 47.9 %   MCV 85.2 80 - 97 fL   MCH, POC 28.9 27 - 31.2 pg   MCHC 33.9 31.8 - 35.4 g/dL   RDW, POC 15.0 %   Platelet Count, POC 314 142 - 424 K/uL   MPV 6.8 0 - 99.8 fL  POCT glycosylated hemoglobin (Hb A1C)  Result Value Ref Range   Hemoglobin A1C 5.7    EKG essentially normal.  Assessment & Plan:   Assessment: 1. Dizziness and giddiness  2. Palpitations   3. Otalgia of right ear   4. Essential hypertension   5. Polypharmacy   6. Flushing   7. Urinary frequency       Plan: Although she feels bad and has multitude of symptoms, nothing real specific to identify what is going on. Will check some labs and discuss from there. Reassurance that she is not diabetic. Some of the labs go out to the commercial lab the CBC and glucose are normal.  Explained I do not have any easy answers but I will let her know the results of things.  Orders Placed This Encounter  Procedures  . Comprehensive metabolic panel  . Magnesium  . POCT CBC  . POCT SEDIMENTATION RATE  . POCT glycosylated hemoglobin (Hb A1C)  . POCT glucose (manual entry)  . POCT urinalysis dipstick  . POCT Microscopic Urinalysis ([facility])  . EKG 12-Lead    No orders of the defined types were placed in this encounter.         Patient Instructions  I will let you know the results of your labs in the  next few days. In the meanwhile take it easy for the next few days and see how you do. I suspect you may have a viral infection causing her to feel so bad.  Continue to try and get a good night sleep  When possible increase exercise. The water aerobics is a good thing for you.  Return if worse or not improving  I recommend that you discuss with your primary doctor whether you can wean off any of the medication safely, or at least cut back on them.     Return if symptoms worsen or fail to improve.   Taylor Lapinski, MD 04/05/2015

## 2015-04-06 LAB — MAGNESIUM: Magnesium: 2.1 mg/dL (ref 1.5–2.5)

## 2015-04-06 LAB — COMPREHENSIVE METABOLIC PANEL
ALT: 46 U/L — ABNORMAL HIGH (ref 6–29)
AST: 35 U/L (ref 10–35)
Albumin: 4.6 g/dL (ref 3.6–5.1)
Alkaline Phosphatase: 86 U/L (ref 33–130)
BUN: 23 mg/dL (ref 7–25)
CO2: 26 mmol/L (ref 20–31)
Calcium: 9.7 mg/dL (ref 8.6–10.4)
Chloride: 100 mmol/L (ref 98–110)
Creat: 1.14 mg/dL — ABNORMAL HIGH (ref 0.50–0.99)
Glucose, Bld: 98 mg/dL (ref 65–99)
Potassium: 4.7 mmol/L (ref 3.5–5.3)
Sodium: 137 mmol/L (ref 135–146)
Total Bilirubin: 0.5 mg/dL (ref 0.2–1.2)
Total Protein: 7.7 g/dL (ref 6.1–8.1)

## 2015-04-08 ENCOUNTER — Telehealth: Payer: Self-pay

## 2015-04-08 NOTE — Telephone Encounter (Signed)
Pt req. Call back for lab results/ she states she has lft msgs w/lab and has been waiting since Monday.  [phone]

## 2015-04-09 ENCOUNTER — Encounter: Payer: Self-pay | Admitting: Family Medicine

## 2015-04-09 NOTE — Telephone Encounter (Signed)
See labs 

## 2015-04-12 ENCOUNTER — Other Ambulatory Visit: Payer: Self-pay | Admitting: Family Medicine

## 2015-04-12 DIAGNOSIS — R7989 Other specified abnormal findings of blood chemistry: Secondary | ICD-10-CM

## 2015-04-13 ENCOUNTER — Encounter: Payer: Self-pay | Admitting: Family Medicine

## 2015-04-14 NOTE — Telephone Encounter (Signed)
Dr. Linna Darner,  It looks like we did not do a CMP with [facility].

## 2015-04-20 ENCOUNTER — Other Ambulatory Visit: Payer: Self-pay | Admitting: Family Medicine

## 2015-05-07 ENCOUNTER — Encounter: Payer: Self-pay | Admitting: Family Medicine

## 2015-05-10 ENCOUNTER — Ambulatory Visit (HOSPITAL_BASED_OUTPATIENT_CLINIC_OR_DEPARTMENT_OTHER): Payer: BLUE CROSS/BLUE SHIELD | Attending: Internal Medicine

## 2015-05-10 ENCOUNTER — Encounter: Payer: Self-pay | Admitting: Family Medicine

## 2015-05-10 ENCOUNTER — Other Ambulatory Visit: Payer: Self-pay | Admitting: Family Medicine

## 2015-05-10 VITALS — Ht 65.0 in | Wt 212.0 lb

## 2015-05-10 DIAGNOSIS — R0683 Snoring: Secondary | ICD-10-CM | POA: Insufficient documentation

## 2015-05-10 DIAGNOSIS — Z79899 Other long term (current) drug therapy: Secondary | ICD-10-CM | POA: Diagnosis not present

## 2015-05-10 DIAGNOSIS — I129 Hypertensive chronic kidney disease with stage 1 through stage 4 chronic kidney disease, or unspecified chronic kidney disease: Secondary | ICD-10-CM

## 2015-05-10 DIAGNOSIS — I471 Supraventricular tachycardia: Secondary | ICD-10-CM

## 2015-05-12 NOTE — Telephone Encounter (Deleted)
Medical records has sent your records to your provider. We have removed the other PCP from our system. Per medical records; we are not able to add PCP because [facility] is not in our electronic medical record system.

## 2015-05-15 DIAGNOSIS — G4733 Obstructive sleep apnea (adult) (pediatric): Secondary | ICD-10-CM

## 2015-05-15 NOTE — Progress Notes (Signed)
  Patient Name: Taylor, [name] Date: 05/10/2015 Gender: Female D.O.B: 1950/04/12 Age (years): 65 Referring Provider: Dimas Aguas Height (inches): 65 Interpreting Physician: Baird Lyons MD, ABSM Weight (lbs): 212 RPSGT: Joni Reining BMI: 35 MRN: [id number] Neck Size: 15.00 CLINICAL INFORMATION Sleep Study Type: NPSG Indication for sleep study: Snoring Epworth Sleepiness Score: 8  SLEEP STUDY TECHNIQUE As per the AASM Manual for the Scoring of Sleep and Associated Events v2.3 (April 2016) with a hypopnea requiring 4% desaturations. The channels recorded and monitored were frontal, central and occipital EEG, electrooculogram (EOG), submentalis EMG (chin), nasal and oral airflow, thoracic and abdominal wall motion, anterior tibialis EMG, snore microphone, electrocardiogram, and pulse oximetry.  MEDICATIONS Patient's medications include: charted for review. Medications self-administered by patient during sleep study : nexium, clonazepam, amitriptyline, BASA  SLEEP ARCHITECTURE The study was initiated at 10:40:05 PM and ended at 4:46:39 AM. Sleep onset time was 5.0 minutes and the sleep efficiency was 84.0%. The total sleep time was 308.0 minutes. Stage REM latency was 145.0 minutes. The patient spent 4.71% of the night in stage N1 sleep, 68.99% in stage N2 sleep, 0.00% in stage N3 and 26.30% in REM. Alpha intrusion was absent. Supine sleep was 51.95%. Wake after sleep onset 53 minutes  RESPIRATORY PARAMETERS The overall apnea/hypopnea index (AHI) was 4.9 per hour. There were 1 total apneas, including 1 obstructive, 0 central and 0 mixed apneas. There were 24 hypopneas and 1 RERAs. The AHI during Stage REM sleep was 14.8 per hour. AHI while supine was 8.6 per hour. The mean oxygen saturation was 93.39%. The minimum SpO2 during sleep was 86.00%. Soft snoring was noted during this study.  CARDIAC DATA The 2 lead EKG demonstrated sinus rhythm. The mean  heart rate was 69.57 beats per minute. Other EKG findings include: None.  LEG MOVEMENT DATA The total PLMS were 0 with a resulting PLMS index of 0.00. Associated arousal with leg movement index was 0.0 .  IMPRESSIONS - No significant obstructive sleep apnea occurred during this study (AHI = 4.9/h). - No significant central sleep apnea occurred during this study (CAI = 0.0/h). - Mild oxygen desaturation was noted during this study (Min O2 = 86.00%). - The patient snored with Soft snoring volume. - No cardiac abnormalities were noted during this study. - Clinically significant periodic limb movements did not occur during sleep. No significant associated arousals.  DIAGNOSIS - Normal study  RECOMMENDATIONS - Avoid alcohol, sedatives and other CNS depressants that may worsen sleep apnea and disrupt normal sleep architecture. - Sleep hygiene should be reviewed to assess factors that may improve sleep quality. - Weight management and regular exercise should be initiated or continued if appropriate.  ELECTRONICALLY SIGNED ON:  05/15/2015, 10:22 AM [facility] PH: ([phone]) [phone]   FX: [phone] [place]

## 2015-05-20 DIAGNOSIS — I471 Supraventricular tachycardia: Secondary | ICD-10-CM | POA: Insufficient documentation

## 2015-08-03 ENCOUNTER — Encounter: Payer: Self-pay | Admitting: Skilled Nursing Facility1

## 2015-08-03 ENCOUNTER — Encounter: Payer: BLUE CROSS/BLUE SHIELD | Attending: Family Medicine | Admitting: Skilled Nursing Facility1

## 2015-08-03 VITALS — Ht 65.0 in | Wt 209.0 lb

## 2015-08-03 DIAGNOSIS — E669 Obesity, unspecified: Secondary | ICD-10-CM

## 2015-08-03 NOTE — Patient Instructions (Signed)
-  Try to stay about 1500-2000 mg of sodium a day -Use the bike, arm chair exercises, or dance a few times a week for as long as you can tolerate -Try kefir -Manage your stress: hobbies, activity, gardening -Rinse your canned items

## 2015-08-03 NOTE — Progress Notes (Signed)
  Medical Nutrition Therapy:  Appt start time: 1100 end time:  1200.   Assessment:  Primary concerns today: referred for obesity. Pt states she has chronic kidney disease stage 2-3. Pt states she has had 2 knee surgeries and 4 back surgeries.  Pt states she has not had her TSH level in the last year. Pt states she cannot lose wt, has very low energy when she used to have high energy, a foggy thought process/memory, with no hair loss. Pt states she has to take pain medication sometimes which causes some constipation. Pt states she struggles with UTI fairly frequently.   Preferred Learning Style:   No preference indicated   Learning Readiness:   Ready  MEDICATIONS: See List   DIETARY INTAKE:  Usual eating pattern includes 3 meals and 3 snacks per day.  Everyday foods include none stated.  Avoided foods include none stated.    24-hr recall:  B ( AM): greek yogurt with apple and granola Snk ( AM): apple---nuts----fig bars L ( PM): cereal----salad Snk ( PM): popcorn D ( PM): pork chop----lean beef----onion Snk ( PM): fruit gummies----low carb bar ice cream Beverages: water with flavorings, carbonated water  Usual physical activity: ADL's  Estimated energy needs: 1600 calories 180 g carbohydrates 120 g protein 44 g fat  Progress Towards Goal(s):  In progress.   Nutritional Diagnosis:  NB-1.1 Food and nutrition-related knowledge deficit As related to no prior nutrition education from a nutrition professional.  As evidenced by pt report and 24 hr recall.    Intervention:  Nutrition counseling for obesity. Dietitian educated the pt on a low sodium diet, balanced meals, and physical activity. Goals: -Try to stay about 1500-2000 mg of sodium a day -Use the bike, arm chair exercises, or dance a few times a week for as long as you can tolerate -Try kefir -Manage your stress: hobbies, activity, gardening -Rinse your canned items  Teaching Method Utilized:   Visual Auditory  Handouts given during visit include:  Low sodium flavoring options  Barriers to learning/adherence to lifestyle change: fear of harming kidneys  Demonstrated degree of understanding via:  Teach Back   Monitoring/Evaluation:  Dietary intake, exercise, kidney labs, and body weight prn.

## 2015-10-14 ENCOUNTER — Telehealth: Payer: Self-pay | Admitting: Vascular Surgery

## 2015-10-14 NOTE — Telephone Encounter (Signed)
Spoke to pt to sch appt 10/10; lab at 1:00 and MD at 2:15.

## 2015-10-14 NOTE — Telephone Encounter (Signed)
-----   Message from Rica Records, RN sent at 10/14/2015 12:43 PM EDT ----- Regarding: RE: poss vv appt? She had a laser ablation (left greater saphenous vein) done in January of 2011.  She will need to be a new VV patient since it has been > 3 years since she has been seen.  Go ahead and schedule her to see Dr. Donnetta Hutching since she is his established patient.  She will need to have a venous reflux study scheduled with this new evaluation.    ----- Message ----- From: Georgiann Mccoy Sent: 10/14/2015  11:52 AM To: Rica Records, RN Subject: poss vv appt?                                  She was a pt of [facility] in 2011. She is calling back, says she has venous insufficiency; her feet swell and she has several knots on her L LE.   I could not see in the chart, but was she a laser abl pt? And if so should I bring her in as a new pt or a follow up?  Thank you, Selinda Eon

## 2015-10-21 ENCOUNTER — Other Ambulatory Visit: Payer: Self-pay | Admitting: Vascular Surgery

## 2015-10-21 DIAGNOSIS — I872 Venous insufficiency (chronic) (peripheral): Secondary | ICD-10-CM

## 2015-11-08 ENCOUNTER — Other Ambulatory Visit: Payer: Self-pay | Admitting: Internal Medicine

## 2015-11-08 DIAGNOSIS — R7989 Other specified abnormal findings of blood chemistry: Secondary | ICD-10-CM

## 2015-11-11 ENCOUNTER — Encounter: Payer: Self-pay | Admitting: Family Medicine

## 2015-11-16 ENCOUNTER — Encounter (HOSPITAL_COMMUNITY): Payer: BLUE CROSS/BLUE SHIELD

## 2015-11-16 ENCOUNTER — Encounter: Payer: BLUE CROSS/BLUE SHIELD | Admitting: Vascular Surgery

## 2015-11-18 ENCOUNTER — Ambulatory Visit
Admission: RE | Admit: 2015-11-18 | Discharge: 2015-11-18 | Disposition: A | Discharge status: Home / Self Care | Payer: BLUE CROSS/BLUE SHIELD | Source: Ambulatory Visit | Attending: Internal Medicine | Admitting: Internal Medicine

## 2015-11-18 DIAGNOSIS — R7989 Other specified abnormal findings of blood chemistry: Secondary | ICD-10-CM

## 2015-11-19 ENCOUNTER — Other Ambulatory Visit: Payer: BLUE CROSS/BLUE SHIELD

## 2015-11-19 ENCOUNTER — Encounter: Payer: Self-pay | Admitting: Emergency Medicine

## 2015-11-19 DIAGNOSIS — C439 Malignant melanoma of skin, unspecified: Secondary | ICD-10-CM

## 2015-11-19 HISTORY — DX: Malignant melanoma of skin, unspecified: C43.9

## 2015-12-01 ENCOUNTER — Telehealth: Payer: Self-pay | Admitting: *Deleted

## 2015-12-01 NOTE — Telephone Encounter (Signed)
Returning [name]"s earlier phone call with concerns about right leg pain and swelling.  She states she has had right leg pain and swelling for 2 weeks and was advised by her internist (2 weeks ago) to go to the emergency room and have her "right leg scanned" and to see her primary care doctor. [name] states she has not gone to the emergency room or seen her PCP and right leg pain and swelling have continued. Advised her to go to emergency room and have have venous duplex exam (right leg to assess for DVT) and to contact her primary care physician as would treat her and manage her medical treatment if DVT was found on venous duplex.  [name] verbalized understanding of instructions.

## 2015-12-02 ENCOUNTER — Telehealth: Payer: Self-pay | Admitting: Family Medicine

## 2015-12-02 ENCOUNTER — Ambulatory Visit (INDEPENDENT_AMBULATORY_CARE_PROVIDER_SITE_OTHER): Payer: BLUE CROSS/BLUE SHIELD | Admitting: Family Medicine

## 2015-12-02 ENCOUNTER — Encounter: Payer: Self-pay | Admitting: Family Medicine

## 2015-12-02 ENCOUNTER — Ambulatory Visit (HOSPITAL_BASED_OUTPATIENT_CLINIC_OR_DEPARTMENT_OTHER)
Admission: RE | Admit: 2015-12-02 | Discharge: 2015-12-02 | Disposition: A | Discharge status: Home / Self Care | Payer: BLUE CROSS/BLUE SHIELD | Source: Ambulatory Visit | Attending: Family Medicine | Admitting: Family Medicine

## 2015-12-02 ENCOUNTER — Ambulatory Visit (INDEPENDENT_AMBULATORY_CARE_PROVIDER_SITE_OTHER): Payer: BLUE CROSS/BLUE SHIELD

## 2015-12-02 VITALS — BP 122/80 | HR 73 | Temp 98.1°F | Resp 16 | Ht 66.0 in | Wt 211.0 lb

## 2015-12-02 DIAGNOSIS — Z888 Allergy status to other drugs, medicaments and biological substances status: Secondary | ICD-10-CM

## 2015-12-02 DIAGNOSIS — M79671 Pain in right foot: Secondary | ICD-10-CM

## 2015-12-02 DIAGNOSIS — M25561 Pain in right knee: Secondary | ICD-10-CM

## 2015-12-02 DIAGNOSIS — M7989 Other specified soft tissue disorders: Secondary | ICD-10-CM | POA: Diagnosis not present

## 2015-12-02 DIAGNOSIS — M7121 Synovial cyst of popliteal space [Baker], right knee: Secondary | ICD-10-CM

## 2015-12-02 NOTE — Patient Instructions (Addendum)
Your appointment is at 230pm for your doppler at [facility]. Address: [address], [place], [place] [address].  Phone:  [phone].   You do have arthritis in both year knee in her foot.  I do feel a Baker's cyst in your right knee. This can sometimes cause pain. You should follow-up with the orthopedist for this.  I have added the lisinopril and captopril tear allergy list.  Go today for a ultrasound of your leg. I do not think you have a blood clot I think the swelling is coming for many.  It was nice to meet you today!  Baker Cyst A Baker cyst is a sac-like structure that forms in the back of the knee. It is filled with the same fluid that is located in your knee. This fluid lubricates the bones and cartilage of the knee and allows them to move over each other more easily. CAUSES  When the knee becomes injured or inflamed, increased fluid forms in the knee. When this happens, the joint lining is pushed out behind the knee and forms the Baker cyst. This cyst may also be caused by inflammation from arthritic conditions and infections. SIGNS AND SYMPTOMS  A Baker cyst usually has no symptoms. When the cyst is substantially enlarged:  You may feel pressure behind the knee, stiffness in the knee, or a mass in the area behind the knee.  You may develop pain, redness, and swelling in the calf. This can suggest a blood clot and requires evaluation by your health care provider. DIAGNOSIS  A Baker cyst is most often found during an ultrasound exam. This exam may have been performed for other reasons, and the cyst was found incidentally. Sometimes an MRI is used. This picks up other problems within a joint that an ultrasound exam may not. If the Baker cyst developed immediately after an injury, X-ray exams may be used to diagnose the cyst. TREATMENT  The treatment depends on the cause of the cyst. Anti-inflammatory medicines and rest often will be prescribed. If the cyst is caused  by a bacterial infection, antibiotic medicines may be prescribed.  HOME CARE INSTRUCTIONS   If the cyst was caused by an injury, for the first 24 hours, keep the injured leg elevated on 2 pillows while lying down.  For the first 24 hours while you are awake, apply ice to the injured area:  Put ice in a plastic bag.  Place a towel between your skin and the bag.  Leave the ice on for 20 minutes, 2-3 times a day.  Only take over-the-counter or prescription medicines for pain, discomfort, or fever as directed by your health care provider.  Only take antibiotic medicine as directed. Make sure to finish it even if you start to feel better. MAKE SURE YOU:   Understand these instructions.  Will watch your condition.  Will get help right away if you are not doing well or get worse.   This information is not intended to replace advice given to you by your health care provider. Make sure you discuss any questions you have with your health care provider.   Document Released: 02/27/2005 Document Revised: 12/18/2012 Document Reviewed: 10/09/2012 Elsevier Interactive Patient Education [employer].

## 2015-12-02 NOTE — Progress Notes (Signed)
[name] is a 65 y.o. female who presents to [facility] today for Right knee and foot swelling:  1.  Right knee pain:  Patient has had pain in right leg the past 2 weeks. She states that she notices when she tries to twist her knee or turn a certain direction. Describes as a sharp stabbing pain that occurs medial aspect of knee for a few seconds then remits. She's noted some mild swelling in that knee as well. No falls. No injury. No trauma. Due to the swelling she is concerned she has a blood clot. She contacted her vascular surgeon for whom she has been treated with pain stripping of varicose veins the past recommended she go to the emergency room yesterday. She therefore showed up today because this is been going on for couple weeks did not want to wait in the emergency room. No redness or swelling to her calf. No pain of her calf. She does have numbness of her calf and foot secondary to spinal injury.  2.  Right foot pain:  Also present for about 2 weeks. However she thinks may have been going on for her knee pain she is not sure. As noted above she does have chronic numbness in her foot secondary to spinal cord injury. She describes sharp stabbing pain on the dorsum of her foot. No swelling. No redness. Pain is worse when she walks or bears weight. No trauma or injury.  About 2 weeks ago she was tested for ACTH stimulation. Evidently during this test she was given captopril. About 20 minutes later she began having numbness and tingling around her lips and throat. She evidently has a known allergy to lisinopril. She first noted the pain in her right knee after being administered the captopril as well.    ROS as above.    PMH reviewed. Patient is a nonsmoker.   Past Medical History:  Diagnosis Date  . Allergy   . Arthritis   . Collagen vascular disease ([place])   . Deformity of joint 04/23/2013  . Derangement of knee, left 2009   started with fall at work. Has  had repair patella and torn meniscus  . High cholesterol   . Hx of appendectomy   . Hypertension   . Melanoma ([place]) 11/19/2015  . Migraine headache    hormonal related - less frequent off hormone replacement  . Migraines   . Neuropathy ([place]) 04/23/2013  . Psoriasis 04/23/2013  . Raynaud disease   . SVT (supraventricular tachycardia) ([place])   . Synovial cyst of lumbar facet joint    lower back - s/p surgery    Past Surgical History:  Procedure Laterality Date  . APPENDECTOMY  1971  . CERVICAL DISCECTOMY  1999   diskectomy with fixation:plate and screws  . KNEE ARTHROSCOPY W/ MENISCAL REPAIR  09   left knee  . L4-5 lumbar fusion  September 02, 2010   Dr. Hal Neer:  minimally invasive transforaminal interbody fusion with spacer  . OVARIAN CYST SURGERY  1972  . PATELLAR REEFING  '09   repair of fractured patella after fall  . SPINE SURGERY    . SYNOVIAL CYST EXCISION  2007   lumbar spine  . VENOUS ABLATION     for pain in the groin - VVTS did procedure. Has some residual discomfort at the  ablation site.     Medications reviewed. Current Outpatient Prescriptions  Medication Sig Dispense Refill  . acetaminophen (TYLENOL) 500 MG tablet  Take 500 mg by mouth every 6 (six) hours as needed for mild pain, moderate pain or headache.     Marland Kitchen amitriptyline (ELAVIL) 10 MG tablet Take 4 tablets (40 mg total) by mouth at bedtime. (Patient taking differently: Take 20 mg by mouth at bedtime. ) 120 tablet 0  . aspirin EC 81 MG tablet Take 81 mg by mouth every other day.     . B Complex Vitamins (VITAMIN B COMPLEX PO) Take 1 tablet by mouth daily.     . calcium gluconate 500 MG tablet Take 1 tablet by mouth daily. Reported on 04/05/2015    . clonazePAM (KLONOPIN) 1 MG tablet Take 1 tablet (1 mg total) by mouth at bedtime as needed for anxiety. (Patient taking differently: Take 1 mg by mouth at bedtime. ) 30 tablet 0  . doxycycline (VIBRAMYCIN) 100 MG capsule Take 1 capsule (100 mg total) by mouth 2 (two)  times daily. (Patient not taking: Reported on 04/05/2015) 14 capsule 0  . esomeprazole (NEXIUM) 40 MG capsule Take 1 capsule (40 mg total) by mouth daily at 12 noon. (Patient taking differently: Take 40 mg by mouth at bedtime. ) 30 capsule 2  . hydrochlorothiazide (HYDRODIURIL) 25 MG tablet Take 1 tablet (25 mg total) by mouth daily. 30 tablet 11  . HYDROcodone-acetaminophen (NORCO/VICODIN) 5-325 MG per tablet Take 1-2 tablets by mouth every 6 (six) hours as needed for moderate pain. Reported on 04/05/2015    . ipratropium (ATROVENT) 0.03 % nasal spray Place 2 sprays into the nose 4 (four) times daily. (Patient not taking: Reported on 10/15/2014) 30 mL 1  . magnesium gluconate (MAGONATE) 500 MG tablet Take 500 mg by mouth daily.     . metoprolol tartrate (LOPRESSOR) 25 MG tablet Take 12.5 mg by mouth 2 (two) times daily.    . Multiple Vitamin (MULTIVITAMIN WITH MINERALS) TABS Take 1 tablet by mouth daily.    . naproxen sodium (ANAPROX) 220 MG tablet Take 440 mg by mouth 2 (two) times daily as needed (pain). Reported on 04/05/2015    . OMEGA-3 FATTY ACIDS PO Take 1 g by mouth.    . pantoprazole (PROTONIX) 40 MG tablet Take 1 tablet (40 mg total) by mouth daily. PATIENT NEEDS OFFICE VISIT/FASTING LABS FOR ADDITIONAL REFILLS (Patient not taking: Reported on 04/05/2015) 30 tablet 0  . sucralfate (CARAFATE) 1 G tablet Take 1 g by mouth daily.    Marland Kitchen tiZANidine (ZANAFLEX) 2 MG tablet Take 2 mg by mouth 2 (two) times daily. Reported on 04/05/2015    . vitamin E 400 UNIT capsule Take 400 Units by mouth every other day. Reported on 04/05/2015     No current facility-administered medications for this visit.      Physical Exam:  Pulse 73   Temp 98.1 F (36.7 C) (Oral)   Resp 16   Ht 5\' 6"  (1.676 m)   Wt 211 lb (95.7 kg)   SpO2 95%   BMI 34.06 kg/m  Gen:  Alert, cooperative patient who appears stated age in no acute distress.  Vital signs reviewed. HEENT: EOMI,  MMM Pulm:  Clear to auscultation bilaterally  with good air movement.  No wheezes or rales noted.   Cardiac:  Regular rate and rhythm without murmur auscultated.  Good S1/S2. MSK: Right knee. Minimal effusion noted. No redness. No joint line tenderness medial or laterally. Anterior posterior drawer tests are negative. Varus and valgus testing are negative. She does have what feels to Baker's cyst posterior popliteal fossa.  She is able to bear weight without pain. She has some apprehension at trying to squat. Unable to do this secondary to fear of pain. -Right foot: No sensation plantar aspect of foot. She has some mild pain across the dorsum of her foot. No redness. No swelling. She is able to dorsiflex and plantarflex 5 out of 5 strength. No pain with that either. Extremities: Left lower extremity within normal limits except for some trace edema at ankle.. Right lower extremity minimal/trace edema at ankle. No redness. Well-perfused.  No calf tenderness. Does not actually look different from her left lower extremity based on visualization.  Assessment and Plan:  1.  Right knee pain: - secondary to tricompartment OA.  Also likely component of acute cartilage tear, nontraumatic.   She already is seen by a orthopedist. She should follow-up with him for further evaluation. -Do note Baker's cyst on the right. I did discuss with her what this is. - Refer to Ortho for evaluation of OA and cartilage tear.    #2 right foot pain: - Likely tendinitis. Negative x-rays. -Did discuss increasing activity as well as weight loss for treatment. -She has tramadol which she can take for pain relief.  #3. Leg swelling: -Sending for ultrasound today. I think this is less likely. -However she does have numbness in that knee and this is been on for the past 2 weeks. Were likely secondary to Baker's cyst.  4.  ACE inhibitor allergy. -She does have non-differentiated allergy to lisinopril and her allergy list. -I have added the fact this causes angioedema to  her allergy list.

## 2015-12-03 NOTE — Telephone Encounter (Signed)
Called and spoke with patient yesterday evening. This is a late note. Relayed to her that the fact that her lower venous Doppler was negative for any DVT. Patient was appreciative of call. Answered all questions.

## 2015-12-17 ENCOUNTER — Encounter: Payer: Self-pay | Admitting: Vascular Surgery

## 2015-12-21 ENCOUNTER — Encounter: Payer: Self-pay | Admitting: Vascular Surgery

## 2015-12-21 ENCOUNTER — Ambulatory Visit (HOSPITAL_COMMUNITY)
Admission: RE | Admit: 2015-12-21 | Discharge: 2015-12-21 | Disposition: A | Discharge status: Home / Self Care | Payer: BLUE CROSS/BLUE SHIELD | Source: Ambulatory Visit | Attending: Vascular Surgery | Admitting: Vascular Surgery

## 2015-12-21 ENCOUNTER — Ambulatory Visit (INDEPENDENT_AMBULATORY_CARE_PROVIDER_SITE_OTHER): Payer: BLUE CROSS/BLUE SHIELD | Admitting: Vascular Surgery

## 2015-12-21 VITALS — BP 122/71 | HR 73 | Temp 98.0°F | Resp 18 | Ht 64.0 in | Wt 210.2 lb

## 2015-12-21 DIAGNOSIS — I8392 Asymptomatic varicose veins of left lower extremity: Secondary | ICD-10-CM | POA: Insufficient documentation

## 2015-12-21 DIAGNOSIS — I87302 Chronic venous hypertension (idiopathic) without complications of left lower extremity: Secondary | ICD-10-CM

## 2015-12-21 DIAGNOSIS — I872 Venous insufficiency (chronic) (peripheral): Secondary | ICD-10-CM | POA: Diagnosis not present

## 2015-12-21 NOTE — Progress Notes (Signed)
Vascular and Vein Specialist of [place]  Patient name: [name] MRN: [id number] DOB: 19-Aug-1950 Sex: female  REASON FOR CONSULT: Evaluation of lower extremity pain and swelling  HPI: [name] is a 65 y.o. female, who is here today for evaluation of lower from the pain and swelling. She is known to me from a prior great saphenous vein ablation in 2011 and also stab phlebectomy of multiple tributary varicosities at that same setting. She has multiple compliant components of discomfort. She does have bilateral lower extremity swelling but this is felt to be related to fluid retention in general. She had swelling in her right knee with pain and the duplex showed ruptured Bakers cyst. He does report some scattered varicosities in her medial posterior left calf and does report some discomfort over these. No history of DVT. She has no changes related to skin changes related to venous hypertension. She is also concerned regarding a recent CT scan for other causes that the reports calcification and atherosclerotic change of her aorta and iliac vessels. Has no symptoms of arterial insufficiency. Specifically he has no claudication.  Past Medical History:  Diagnosis Date  . Allergy   . Arthritis   . Chronic kidney disease   . Collagen vascular disease ([place])   . Deformity of joint 04/23/2013  . Derangement of knee, left 2009   started with fall at work. Has had repair patella and torn meniscus  . High cholesterol   . Hx of appendectomy   . Hypertension   . Melanoma ([place]) 11/19/2015  . Migraine headache    hormonal related - less frequent off hormone replacement  . Migraines   . Neuropathy ([place]) 04/23/2013  . Psoriasis 04/23/2013  . Raynaud disease   . SVT (supraventricular tachycardia) ([place])   . Synovial cyst of lumbar facet joint    lower back - s/p surgery     Family History  Problem Relation Age of Onset  . Diabetes Mother   .  COPD Mother   . Aortic aneurysm Mother   . Hypertension Mother   . Hypothyroidism Mother   . Cancer Father     lung  . Hyperlipidemia Father   . Hypertension Father   . Heart disease Father   . Diabetes Father   . Diabetes Brother     SOCIAL HISTORY: Social History   Social History  . Marital status: Widowed    Spouse name: N/A  . Number of children: N/A  . Years of education: 66   Occupational History  . 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 [employer]   Social History Main Topics  . Smoking status: Never Smoker  . Smokeless tobacco: Never Used  . Alcohol use No  . Drug use: No  . Sexual activity: Yes    Partners: Male    Birth control/ protection: None   Other Topics Concern  . 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 1 dtr- '72; 1 son '73; 4 grandchildren, 2 step-grands. 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     Allergies  Allergen Reactions  . Lyrica [Pregabalin] Anaphylaxis  . Nsaids Swelling    swelling  . Tolmetin Swelling  . Prednisone Swelling  . Tape Other (See Comments)    *adhesive* Reaction- blisters   . Captopril   . Lisinopril Swelling  . Caine-1 [Lidocaine Hcl] Palpitations  . Celebrex [Celecoxib]  Palpitations  . Codeine Palpitations  . Nephron [Epinephrine] Palpitations    NASAL SPRAY  . Novocain [Procaine] Other (See Comments)    dizziness  . Other Palpitations    Glucocorticoids/Steroids  . Procaine Hcl Palpitations  . Sulfa Antibiotics Palpitations    Current Outpatient Prescriptions  Medication Sig Dispense Refill  . acetaminophen (TYLENOL) 500 MG tablet Take 500 mg by mouth every 6 (six) hours as needed for mild pain, moderate pain or headache.     Marland Kitchen amitriptyline (ELAVIL) 10 MG tablet Take 4 tablets (40 mg total) by mouth at bedtime. (Patient taking differently: Take 20 mg by mouth at bedtime. ) 120  tablet 0  . aspirin EC 81 MG tablet Take 81 mg by mouth every other day.     . B Complex Vitamins (VITAMIN B COMPLEX PO) Take 1 tablet by mouth daily.     . clonazePAM (KLONOPIN) 1 MG tablet Take 1 tablet (1 mg total) by mouth at bedtime as needed for anxiety. (Patient taking differently: Take 1 mg by mouth at bedtime. ) 30 tablet 0  . levothyroxine (SYNTHROID, LEVOTHROID) 25 MCG tablet Take 25 mcg by mouth daily before breakfast.    . METOPROLOL SUCCINATE ER PO Take 25 mg by mouth. Take 1 tablet in morning and 1/2 tablet if BP elevated in afternoon.    . sucralfate (CARAFATE) 1 G tablet Take 1 g by mouth daily.    Marland Kitchen tiZANidine (ZANAFLEX) 2 MG tablet Take 2 mg by mouth 2 (two) times daily. Reported on 04/05/2015    . traMADol (ULTRAM) 50 MG tablet Take by mouth every 6 (six) hours as needed.    . vitamin E 400 UNIT capsule Take 400 Units by mouth every other day. Reported on 04/05/2015     No current facility-administered medications for this visit.     REVIEW OF SYSTEMS:  [X]  denotes positive finding, [ ]  denotes negative finding Cardiac  Comments:  Chest pain or chest pressure:    Shortness of breath upon exertion: x   Short of breath when lying flat:    Irregular heart rhythm: x       Vascular    Pain in calf, thigh, or hip brought on by ambulation:    Pain in feet at night that wakes you up from your sleep:     Blood clot in your veins:    Leg swelling:         Pulmonary    Oxygen at home:    Productive cough:     Wheezing:         Neurologic    Sudden weakness in arms or legs:     Sudden numbness in arms or legs:     Sudden onset of difficulty speaking or slurred speech:    Temporary loss of vision in one eye:     Problems with dizziness:         Gastrointestinal    Blood in stool:     Vomited blood:         Genitourinary    Burning when urinating:     Blood in urine:        Psychiatric    Major depression:         Hematologic    Bleeding problems:    Problems  with blood clotting too easily:        Skin    Rashes or ulcers:        Constitutional    Fever  or chills:      PHYSICAL EXAM: Vitals:   12/21/15 1351  BP: 122/71  Pulse: 73  Resp: 18  Temp: 98 F (36.7 C)  TempSrc: Oral  SpO2: 97%  Weight: 210 lb 3.2 oz (95.3 kg)  Height: 5\' 4"  (1.626 m)    GENERAL: The patient is a well-nourished female, in no acute distress. The vital signs are documented above. CARDIOVASCULAR: 2+ dorsalis pedis pulses bilaterally. She does have a very few scattered varicosities in her left posterior calf. None on her right  PULMONARY: There is good air exchange  ABDOMEN: Soft and non-tender  MUSCULOSKELETAL: There are no major deformities or cyanosis. NEUROLOGIC: No focal weakness or paresthesias are detected. SKIN: There are no ulcers or rashes noted. PSYCHIATRIC: The patient has a normal affect.  DATA:  I reviewed her formal duplex from 921 and [facility] from [facility] [place] rule out DVT.  We imaged her left leg today. This does show reflux in her left common femoral vein and femoral vein. She does have ablation of her prior great saphenous vein and does appear to have an anterior accessory branch and this is minimal dilatation of the 0.3 -0.41 mm   MEDICAL ISSUES: Long discussion with patient regarding all these issues. I did review her actual CT scan from September as well. This does show more than would be expected atherosclerotic changes but no flow limitation with normal distal pulses. I reassured her regarding this. I feel that she would have minimal if any improvement in treating this is anterior accessory branch. I have recommended continued observation and conservative treatment only with elevation and compression. She was reassured with this discussion will see [place] again on an as-needed basis  Rosetta Posner, MD [facility] Vascular and Vein Specialists of [facility] Tel [phone] Pager [phone]

## 2015-12-22 ENCOUNTER — Telehealth: Payer: Self-pay | Admitting: *Deleted

## 2015-12-22 NOTE — Telephone Encounter (Signed)
Returning [name] earlier phone message requesting clarification regarding her office visit with Dr. Donnetta Hutching on10-12-2015. Reviewed Dr. Luther Parody office note with her explaining that her left anterior accessory branch is minimally dilated so Dr. Donnetta Hutching is recommending continued observation and conservative treatment only with elevation of legs and wearing compression hose.  [name] requested a copy of her venous reflux exam (12-21-2015). I mailed a [facility] medical release form for her to complete and return per her request.

## 2016-01-17 ENCOUNTER — Other Ambulatory Visit: Payer: Self-pay | Admitting: Orthopaedic Surgery

## 2016-01-17 DIAGNOSIS — M542 Cervicalgia: Secondary | ICD-10-CM

## 2016-01-17 DIAGNOSIS — Z77018 Contact with and (suspected) exposure to other hazardous metals: Secondary | ICD-10-CM

## 2016-01-18 ENCOUNTER — Ambulatory Visit
Admission: RE | Admit: 2016-01-18 | Discharge: 2016-01-18 | Disposition: A | Discharge status: Home / Self Care | Payer: BLUE CROSS/BLUE SHIELD | Source: Ambulatory Visit | Attending: Orthopaedic Surgery | Admitting: Orthopaedic Surgery

## 2016-01-18 DIAGNOSIS — Z77018 Contact with and (suspected) exposure to other hazardous metals: Secondary | ICD-10-CM

## 2016-01-18 DIAGNOSIS — M542 Cervicalgia: Secondary | ICD-10-CM

## 2016-03-22 ENCOUNTER — Encounter: Payer: Self-pay | Admitting: Vascular Surgery

## 2016-03-22 ENCOUNTER — Ambulatory Visit (HOSPITAL_COMMUNITY)
Admission: RE | Admit: 2016-03-22 | Discharge: 2016-03-22 | Disposition: A | Discharge status: Home / Self Care | Payer: Medicare Other | Source: Ambulatory Visit | Attending: Vascular Surgery | Admitting: Vascular Surgery

## 2016-03-22 ENCOUNTER — Telehealth: Payer: Self-pay | Admitting: *Deleted

## 2016-03-22 ENCOUNTER — Other Ambulatory Visit: Payer: Self-pay | Admitting: *Deleted

## 2016-03-22 ENCOUNTER — Ambulatory Visit (INDEPENDENT_AMBULATORY_CARE_PROVIDER_SITE_OTHER): Payer: Medicare Other | Admitting: Vascular Surgery

## 2016-03-22 VITALS — BP 140/82 | HR 81 | Temp 97.9°F | Resp 20 | Ht 64.0 in | Wt 213.5 lb

## 2016-03-22 DIAGNOSIS — M7989 Other specified soft tissue disorders: Secondary | ICD-10-CM

## 2016-03-22 DIAGNOSIS — M79604 Pain in right leg: Secondary | ICD-10-CM

## 2016-03-22 DIAGNOSIS — M79661 Pain in right lower leg: Secondary | ICD-10-CM

## 2016-03-22 NOTE — Progress Notes (Signed)
Vascular and Vein Specialist of [place]  Patient name: [name] MRN: [id number] DOB: [birth year] Sex: female  REASON FOR VISIT: Valuation of right leg pain  HPI: [name] is a 66 y.o. female today for evaluation of right leg pain is a abdomen. I'd seen in the past for swelling and discomfort in her left leg. She had undergone prior left leg venous duplex showing no significant correctable a left leg venous pathology. She has a history of left great saphenous vein ablation called this morning report for supporting of severe pain in her right leg with some swelling and coolness on the medial aspect and warmness in the lateral aspect of her foot. She did not have any prior imaging studies so therefore was recommended that we obtain a duplex to rule out DVT. She did not have any prior history of arterial insufficiency but did have prior CT scan showing extensive atherosclerotic changes but no flow-limiting stenosis  Past Medical History:  Diagnosis Date  . Allergy   . Arthritis   . Chronic kidney disease   . Collagen vascular disease ([place])   . Deformity of joint 04/23/2013  . Derangement of knee, left 2009   started with fall at work. Has had repair patella and torn meniscus  . High cholesterol   . Hx of appendectomy   . Hypertension   . Melanoma ([place]) 11/19/2015  . Migraine headache    hormonal related - less frequent off hormone replacement  . Migraines   . Neuropathy ([place]) 04/23/2013  . Psoriasis 04/23/2013  . Raynaud disease   . SVT (supraventricular tachycardia) ([place])   . Synovial cyst of lumbar facet joint    lower back - s/p surgery     Family History  Problem Relation Age of Onset  . Diabetes Mother   . COPD Mother   . Aortic aneurysm Mother   . Hypertension Mother   . Hypothyroidism Mother   . Cancer Father     lung  . Hyperlipidemia Father   . Hypertension Father   . Heart disease Father   . Diabetes  Father   . Diabetes Brother     SOCIAL HISTORY: Social History  Substance Use Topics  . Smoking status: Never Smoker  . Smokeless tobacco: Never Used  . Alcohol use No    Allergies  Allergen Reactions  . Lyrica [Pregabalin] Anaphylaxis  . Nsaids Swelling    swelling  . Tolmetin Swelling  . Prednisone Swelling  . Tape Other (See Comments)    *adhesive* Reaction- blisters   . Captopril   . Lisinopril Swelling  . Caine-1 [Lidocaine Hcl] Palpitations  . Celebrex [Celecoxib] Palpitations  . Codeine Palpitations  . Nephron [Epinephrine] Palpitations    NASAL SPRAY  . Novocain [Procaine] Other (See Comments)    dizziness  . Other Palpitations    Glucocorticoids/Steroids  . Procaine Hcl Palpitations  . Sulfa Antibiotics Palpitations    Current Outpatient Prescriptions  Medication Sig Dispense Refill  . acetaminophen (TYLENOL) 500 MG tablet Take 500 mg by mouth every 6 (six) hours as needed for mild pain, moderate pain or headache.     Marland Kitchen amitriptyline (ELAVIL) 10 MG tablet Take 4 tablets (40 mg total) by mouth at bedtime. (Patient taking differently: Take 20 mg by mouth at bedtime. ) 120 tablet 0  . aspirin EC 81 MG tablet Take 81 mg by mouth every other day.     . B Complex Vitamins (VITAMIN B COMPLEX PO) Take 1  tablet by mouth daily.     . clonazePAM (KLONOPIN) 1 MG tablet Take 1 tablet (1 mg total) by mouth at bedtime as needed for anxiety. (Patient taking differently: Take 1 mg by mouth at bedtime. ) 30 tablet 0  . levothyroxine (SYNTHROID, LEVOTHROID) 25 MCG tablet Take 75 mcg by mouth daily before breakfast.     . METOPROLOL SUCCINATE ER PO Take 25 mg by mouth daily.     . Multiple Vitamins-Minerals (ZINC PO) Take by mouth.    . sucralfate (CARAFATE) 1 G tablet Take 1 g by mouth daily.    Marland Kitchen tiZANidine (ZANAFLEX) 2 MG tablet Take 2 mg by mouth 2 (two) times daily. Reported on 04/05/2015    . traMADol (ULTRAM) 50 MG tablet Take by mouth every 6 (six) hours as needed.    .  naproxen (NAPROSYN) 375 MG tablet Take by mouth.    . vitamin E 400 UNIT capsule Take 400 Units by mouth every other day. Reported on 04/05/2015     No current facility-administered medications for this visit.     REVIEW OF SYSTEMS:  [X]  denotes positive finding, [ ]  denotes negative finding Cardiac  Comments:  Chest pain or chest pressure:    Shortness of breath upon exertion:    Short of breath when lying flat:    Irregular heart rhythm:        Vascular    Pain in calf, thigh, or hip brought on by ambulation:    Pain in feet at night that wakes you up from your sleep:     Blood clot in your veins:    Leg swelling:  x         PHYSICAL EXAM: Vitals:   03/22/16 1320  BP: 140/82  Pulse: 81  Resp: 20  Temp: 97.9 F (36.6 C)  TempSrc: Oral  SpO2: 96%  Weight: 213 lb 8 oz (96.8 kg)  Height: 5\' 4"  (1.626 m)    GENERAL: The patient is a well-nourished female, in no acute distress. The vital signs are documented above. CARDIOVASCULAR: 2+ dorsalis pedis pulses bilaterally. PULMONARY: There is good air exchange  MUSCULOSKELETAL: There are no major deformities or cyanosis. NEUROLOGIC: No focal weakness or paresthesias are detected. SKIN: There are no ulcers or rashes noted. PSYCHIATRIC: The patient has a normal affect. Mild swelling in both lower extremities DATA:  Duplex today was reviewed with the patient showing no evidence of DVT in her right leg.  MEDICAL ISSUES: Pain in the right foot of unknown determine etiology. Reassured her that she does not have any evidence of arterial or venous pathology. She has had multiple prior back surgeries and does have ongoing difficulty associated with this. She will discuss this with her primary care physician will see [place] again on an as-needed basis    Rosetta Posner, MD [facility] Vascular and Vein Specialists of [facility] Tel [phone] Pager [phone]

## 2016-03-22 NOTE — Telephone Encounter (Signed)
Returning Mrs. Aurea Graff earlier telephone message regarding right foot pain.   Mrs. Semhar Ordoyne states she was awoken at 1:45AM this morning (03-22-2016) with severe right foot pain and swelling.  She states "there is coldness on the right side of my foot and warmth and redness on the left side of my foot." She states when she elevates her right foot and leg the pain and swelling worsen and when she sits with her legs down or walks the right foot pain and swelling improves.  Reviewed [name] Haven's current symptoms and clinical records with Dr. Donnetta Hutching.  Dr. Donnetta Hutching ordered venous duplex of right leg and appointment with him today 03-22-2016.  Appointment for venous duplex(right leg) scheduled for 12:30PM today and [facility] follow up appointment with Dr. Donnetta Hutching today (03-22-2016) at 1:30PM.  [name] aware of appointment times today and states she is able to make those appointments.

## 2016-04-04 DIAGNOSIS — E039 Hypothyroidism, unspecified: Secondary | ICD-10-CM | POA: Diagnosis not present

## 2016-04-12 ENCOUNTER — Ambulatory Visit (INDEPENDENT_AMBULATORY_CARE_PROVIDER_SITE_OTHER): Payer: Medicare Other | Admitting: Physician Assistant

## 2016-04-12 VITALS — BP 118/62 | HR 94 | Temp 98.9°F

## 2016-04-12 DIAGNOSIS — R112 Nausea with vomiting, unspecified: Secondary | ICD-10-CM

## 2016-04-12 DIAGNOSIS — R1030 Lower abdominal pain, unspecified: Secondary | ICD-10-CM

## 2016-04-12 DIAGNOSIS — R05 Cough: Secondary | ICD-10-CM | POA: Diagnosis not present

## 2016-04-12 DIAGNOSIS — A0811 Acute gastroenteropathy due to Norwalk agent: Secondary | ICD-10-CM

## 2016-04-12 DIAGNOSIS — R197 Diarrhea, unspecified: Secondary | ICD-10-CM | POA: Diagnosis not present

## 2016-04-12 DIAGNOSIS — R058 Other specified cough: Secondary | ICD-10-CM

## 2016-04-12 LAB — POCT CBC
Granulocyte percent: 89.8 %G — AB (ref 37–80)
HCT, POC: 42.1 % (ref 37.7–47.9)
Hemoglobin: 14.7 g/dL (ref 12.2–16.2)
Lymph, poc: 0.4 — AB (ref 0.6–3.4)
MCH, POC: 29.8 pg (ref 27–31.2)
MCHC: 34.9 g/dL (ref 31.8–35.4)
MCV: 85.4 fL (ref 80–97)
MID (cbc): 0.5 (ref 0–0.9)
MPV: 7.3 fL (ref 0–99.8)
POC Granulocyte: 7.8 — AB (ref 2–6.9)
POC LYMPH PERCENT: 4.7 %L — AB (ref 10–50)
POC MID %: 5.5 %M (ref 0–12)
Platelet Count, POC: 215 10*3/uL (ref 142–424)
RBC: 4.93 M/uL (ref 4.04–5.48)
RDW, POC: 13.9 %
WBC: 8.7 10*3/uL (ref 4.6–10.2)

## 2016-04-12 LAB — POC MICROSCOPIC URINALYSIS (UMFC): Mucus: ABSENT

## 2016-04-12 LAB — POCT URINALYSIS DIP (MANUAL ENTRY)
Bilirubin, UA: NEGATIVE
Glucose, UA: NEGATIVE
Ketones, POC UA: NEGATIVE
Leukocytes, UA: NEGATIVE
Nitrite, UA: NEGATIVE
Protein Ur, POC: NEGATIVE
Spec Grav, UA: 1.02
Urobilinogen, UA: 0.2
pH, UA: 7

## 2016-04-12 LAB — POCT INFLUENZA A/B
Influenza A, POC: NEGATIVE
Influenza B, POC: NEGATIVE

## 2016-04-12 MED ORDER — HYOSCYAMINE SULFATE SL 0.125 MG SL SUBL: 0.1250 mg | SUBLINGUAL_TABLET | Freq: Four times a day (QID) | SUBLINGUAL | 0 refills | Status: AC

## 2016-04-12 MED ORDER — ONDANSETRON 8 MG PO TBDP: 8.0000 mg | ORAL_TABLET | Freq: Three times a day (TID) | ORAL | 0 refills | Status: DC | PRN

## 2016-04-12 NOTE — Patient Instructions (Addendum)
I would like you to make sure that you are hydrating with at least 64 oz of water if not more.   I would like you to return with the sample. Please return if there is no improvement in the next 48 hours. Please hold off on the zanafex, and klonopin at this time, for the next 24 hours.   Food Choices to Help Relieve Diarrhea, Adult When you have diarrhea, the foods you eat and your eating habits are very important. Choosing the right foods and drinks can help relieve diarrhea. Also, because diarrhea can last up to 7 days, you need to replace lost fluids and electrolytes (such as sodium, potassium, and chloride) in order to help prevent dehydration. What general guidelines do I need to follow?  Slowly drink 1 cup (8 oz) of fluid for each episode of diarrhea. If you are getting enough fluid, your urine will be clear or pale yellow.  Eat starchy foods. Some good choices include white rice, white toast, pasta, low-fiber cereal, baked potatoes (without the skin), saltine crackers, and bagels.  Avoid large servings of any cooked vegetables.  Limit fruit to two servings per day. A serving is  cup or 1 small piece.  Choose foods with less than 2 g of fiber per serving.  Limit fats to less than 8 tsp (38 g) per day.  Avoid fried foods.  Eat foods that have probiotics in them. Probiotics can be found in certain dairy products.  Avoid foods and beverages that may increase the speed at which food moves through the stomach and intestines (gastrointestinal tract). Things to avoid include:  High-fiber foods, such as dried fruit, raw fruits and vegetables, nuts, seeds, and whole grain foods.  Spicy foods and high-fat foods.  Foods and beverages sweetened with high-fructose corn syrup, honey, or sugar alcohols such as xylitol, sorbitol, and mannitol. What foods are recommended? Grains  White rice. White, [place], or pita breads (fresh or toasted), including plain rolls, buns, or bagels. White pasta.  Saltine, soda, or graham crackers. Pretzels. Low-fiber cereal. Cooked cereals made with water (such as cornmeal, farina, or cream cereals). Plain muffins. Matzo. Melba toast. Zwieback. Vegetables  Potatoes (without the skin). Strained tomato and vegetable juices. Most well-cooked and canned vegetables without seeds. Tender lettuce. Fruits  Cooked or canned applesauce, apricots, cherries, fruit cocktail, grapefruit, peaches, pears, or plums. Fresh bananas, apples without skin, cherries, grapes, cantaloupe, grapefruit, peaches, oranges, or plums. Meat and Other Protein Products  Baked or boiled chicken. Eggs. Tofu. Fish. Seafood. Smooth peanut butter. Ground or well-cooked tender beef, ham, veal, lamb, pork, or poultry. Dairy  Plain yogurt, kefir, and unsweetened liquid yogurt. Lactose-free milk, buttermilk, or soy milk. Plain hard cheese. Beverages  Sport drinks. Clear broths. Diluted fruit juices (except prune). Regular, caffeine-free sodas such as ginger ale. Water. Decaffeinated teas. Oral rehydration solutions. Sugar-free beverages not sweetened with sugar alcohols. Other  Bouillon, broth, or soups made from recommended foods. The items listed above may not be a complete list of recommended foods or beverages. Contact your dietitian for more options.  What foods are not recommended? Grains  Whole grain, whole wheat, bran, or rye breads, rolls, pastas, crackers, and cereals. Wild or brown rice. Cereals that contain more than 2 g of fiber per serving. Corn tortillas or taco shells. Cooked or dry oatmeal. Granola. Popcorn. Vegetables  Raw vegetables. Cabbage, broccoli, Brussels sprouts, artichokes, baked beans, beet greens, corn, kale, legumes, peas, sweet potatoes, and yams. Potato skins. Cooked spinach and cabbage.  Fruits  Dried fruit, including raisins and dates. Raw fruits. Stewed or dried prunes. Fresh apples with skin, apricots, mangoes, pears, raspberries, and strawberries. Meat and Other  Protein Products  Chunky peanut butter. Nuts and seeds. Beans and lentils. Berniece Salines. Dairy  High-fat cheeses. Milk, chocolate milk, and beverages made with milk, such as milk shakes. Cream. Ice cream. Sweets and Desserts  Sweet rolls, doughnuts, and sweet breads. Pancakes and waffles. Fats and Oils  Butter. Cream sauces. Margarine. Salad oils. Plain salad dressings. Olives. Avocados. Beverages  Caffeinated beverages (such as coffee, tea, soda, or energy drinks). Alcoholic beverages. Fruit juices with pulp. Prune juice. Soft drinks sweetened with high-fructose corn syrup or sugar alcohols. Other  Coconut. Hot sauce. Chili powder. Mayonnaise. Gravy. Cream-based or milk-based soups. The items listed above may not be a complete list of foods and beverages to avoid. Contact your dietitian for more information.  What should I do if I become dehydrated? Diarrhea can sometimes lead to dehydration. Signs of dehydration include dark urine and dry mouth and skin. If you think you are dehydrated, you should rehydrate with an oral rehydration solution. These solutions can be purchased at pharmacies, retail stores, or online. Drink -1 cup (120-240 mL) of oral rehydration solution each time you have an episode of diarrhea. If drinking this amount makes your diarrhea worse, try drinking smaller amounts more often. For example, drink 1-3 tsp (5-15 mL) every 5-10 minutes. A general rule for staying hydrated is to drink 1-2 L of fluid per day. Talk to your health care provider about the specific amount you should be drinking each day. Drink enough fluids to keep your urine clear or pale yellow. This information is not intended to replace advice given to you by your health care provider. Make sure you discuss any questions you have with your health care provider. Document Released: 05/20/2003 Document Revised: 08/05/2015 Document Reviewed: 01/20/2013 Elsevier Interactive Patient Education  2017 [employer].

## 2016-04-12 NOTE — Progress Notes (Signed)
[facility] [address] [place] [address] [phone]  Date:  04/12/2016   Name:  [name]   DOB:  [birth year]   MRN:  [id number]  PCP:  No primary care provider on file.    History of Present Illness:  [name] is a 66 y.o. female patient who presents to [facility] for cc of diarrhea.   --this morning patient woke at 5am with diarrhea.  She has had over 10 episodes.  She reports weakness with diarrhea, and pre-syncope.  Stool is black and brown without blood.  Complains of abdominal cramping.  New hx of bladder incontinence which is followed by urologist.  The patient reports productive cough, and congestion.  She has taken no new medications, but tylenol for abdominal cramping.   She has been visiting the hospital with grandchild (birth).  Contact with family who has similar symptoms.   She is concerned of the flu.  She keeps asking to have the flu test.     Patient Active Problem List   Diagnosis Date Noted  . Melanoma ([place]) 11/19/2015  . SVT (supraventricular tachycardia) ([place]) 05/20/2015  . Chronic venous insufficiency 10/10/2013  . Neuropathy ([place]) 04/23/2013  . Psoriasis 04/23/2013  . Deformity of joint 04/23/2013  . 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 Peripheral cyanosis 09/28/2012  . Arthritis of knee, degenerative 09/02/2012  . Routine health maintenance 08/17/2011  . Anemia, iron deficiency 01/22/2011  . Chronic pain 09/22/2010  . Depression with anxiety 09/21/2010  . Spinal stenosis 08/14/2010  . Hypertension 07/08/2010  . Migraine 07/08/2010  . Knee internal derangement 07/08/2010  . Non-cardiac chest pain 07/08/2010    Past Medical History:  Diagnosis Date  . Allergy   . Arthritis   . Chronic kidney disease   . Collagen vascular disease ([place])   . Deformity of joint 04/23/2013  . Derangement of knee, left 2009   started with fall at work. Has had repair patella and torn meniscus  . High cholesterol    . Hx of appendectomy   . Hypertension   . Melanoma ([place]) 11/19/2015  . Migraine headache    hormonal related - less frequent off hormone replacement  . Migraines   . Neuropathy ([place]) 04/23/2013  . Psoriasis 04/23/2013  . Raynaud disease   . SVT (supraventricular tachycardia) ([place])   . Synovial cyst of lumbar facet joint    lower back - s/p surgery     Past Surgical History:  Procedure Laterality Date  . APPENDECTOMY  1971  . CERVICAL DISCECTOMY  1999   diskectomy with fixation:plate and screws  . KNEE ARTHROSCOPY W/ MENISCAL REPAIR  09   left knee  . L4-5 lumbar fusion  September 02, 2010   Dr. Hal Neer:  minimally invasive transforaminal interbody fusion with spacer  . OVARIAN CYST SURGERY  1972  . PATELLAR REEFING  '09   repair of fractured patella after fall  . SPINE SURGERY    . SYNOVIAL CYST EXCISION  2007   lumbar spine  . VENOUS ABLATION     for pain in the groin - VVTS did procedure. Has some residual discomfort at the  ablation site.     Social History  Substance Use Topics  . Smoking status: Never Smoker  . Smokeless tobacco: Never Used  . Alcohol use No    Family History  Problem Relation Age of Onset  . Diabetes Mother   . COPD Mother   . Aortic aneurysm Mother   .  Hypertension Mother   . Hypothyroidism Mother   . Cancer Father     lung  . Hyperlipidemia Father   . Hypertension Father   . Heart disease Father   . Diabetes Father   . Diabetes Brother     Allergies  Allergen Reactions  . Lyrica [Pregabalin] Anaphylaxis  . Nsaids Swelling    swelling  . Tolmetin Swelling  . Prednisone Swelling  . Tape Other (See Comments)    *adhesive* Reaction- blisters   . Captopril   . Lisinopril Swelling  . Caine-1 [Lidocaine Hcl] Palpitations  . Celebrex [Celecoxib] Palpitations  . Codeine Palpitations  . Nephron [Epinephrine] Palpitations    NASAL SPRAY  . Novocain [Procaine] Other (See Comments)    dizziness  . Other Palpitations     Glucocorticoids/Steroids  . Procaine Hcl Palpitations  . Sulfa Antibiotics Palpitations    Medication list has been reviewed and updated.  Current Outpatient Prescriptions on File Prior to Visit  Medication Sig Dispense Refill  . acetaminophen (TYLENOL) 500 MG tablet Take 500 mg by mouth every 6 (six) hours as needed for mild pain, moderate pain or headache.     Marland Kitchen amitriptyline (ELAVIL) 10 MG tablet Take 4 tablets (40 mg total) by mouth at bedtime. (Patient taking differently: Take 20 mg by mouth at bedtime. ) 120 tablet 0  . aspirin EC 81 MG tablet Take 81 mg by mouth every other day.     . B Complex Vitamins (VITAMIN B COMPLEX PO) Take 1 tablet by mouth daily.     . clonazePAM (KLONOPIN) 1 MG tablet Take 1 tablet (1 mg total) by mouth at bedtime as needed for anxiety. (Patient taking differently: Take 1 mg by mouth at bedtime. ) 30 tablet 0  . levothyroxine (SYNTHROID, LEVOTHROID) 25 MCG tablet Take 75 mcg by mouth daily before breakfast.     . METOPROLOL SUCCINATE ER PO Take 25 mg by mouth daily.     . Multiple Vitamins-Minerals (ZINC PO) Take by mouth.    . naproxen (NAPROSYN) 375 MG tablet Take by mouth.    . sucralfate (CARAFATE) 1 G tablet Take 1 g by mouth daily.    Marland Kitchen tiZANidine (ZANAFLEX) 2 MG tablet Take 2 mg by mouth 2 (two) times daily. Reported on 04/05/2015    . traMADol (ULTRAM) 50 MG tablet Take by mouth every 6 (six) hours as needed.    . vitamin E 400 UNIT capsule Take 400 Units by mouth every other day. Reported on 04/05/2015     No current facility-administered medications on file prior to visit.     ROS ROS otherwise unremarkable unless listed above.   Physical Examination: BP 118/62   Pulse 94   Temp 98.9 F (37.2 C) (Oral)   SpO2 93%  Ideal Body Weight:    Physical Exam  Constitutional: She is oriented to person, place, and time. She appears well-developed and well-nourished. No distress.  HENT:  Head: Normocephalic and atraumatic.  Right Ear: External  ear normal.  Left Ear: External ear normal.  Eyes: Conjunctivae and EOM are normal. Pupils are equal, round, and reactive to light.  Cardiovascular: Normal rate.   Pulmonary/Chest: Effort normal. No respiratory distress.  Abdominal: Normal appearance and bowel sounds are normal. There is tenderness in the right lower quadrant, suprapubic area and left lower quadrant. There is no guarding, no CVA tenderness, no tenderness at McBurney's point and negative Murphy's sign.  Neurological: She is alert and oriented to person, place, and time.  Skin: She is not diaphoretic.  Psychiatric: She has a normal mood and affect. Her behavior is normal.   Results for orders placed or performed in visit on 04/12/16  Urine culture  Result Value Ref Range   Urine Culture, Routine Final report    Urine Culture result 1 No growth   GI Profile, Stool, PCR  Result Value Ref Range   Campylobacter Not Detected Not Detected   C difficile toxin A/B Not Detected Not Detected   Plesiomonas shigelloides Not Detected Not Detected   Salmonella Not Detected Not Detected   Vibrio Not Detected Not Detected   Vibrio cholerae Not Detected Not Detected   Yersinia enterocolitica Not Detected Not Detected   Enteroaggregative E coli Not Detected Not Detected   Enteropathogenic E coli Not Detected Not Detected   Enterotoxigenic E coli Not Detected Not Detected   Shiga-toxin-producing E coli Not Detected Not Detected   E coli Q657 Not applicable Not Detected   Shigella/Enteroinvasive E coli Not Detected Not Detected   Cryptosporidium Not Detected Not Detected   Cyclospora cayetanensis Not Detected Not Detected   Entamoeba histolytica Not Detected Not Detected   Giardia lamblia Not Detected Not Detected   Adenovirus F 40/41 Not Detected Not Detected   Astrovirus Not Detected Not Detected   Norovirus GI/GII Detected (A) Not Detected   Rotavirus A Not Detected Not Detected   Sapovirus Not Detected Not Detected  POCT CBC   Result Value Ref Range   WBC 8.7 4.6 - 10.2 K/uL   Lymph, poc 0.4 (A) 0.6 - 3.4   POC LYMPH PERCENT 4.7 (A) 10 - 50 %L   MID (cbc) 0.5 0 - 0.9   POC MID % 5.5 0 - 12 %M   POC Granulocyte 7.8 (A) 2 - 6.9   Granulocyte percent 89.8 (A) 37 - 80 %G   RBC 4.93 4.04 - 5.48 M/uL   Hemoglobin 14.7 12.2 - 16.2 g/dL   HCT, POC 42.1 37.7 - 47.9 %   MCV 85.4 80 - 97 fL   MCH, POC 29.8 27 - 31.2 pg   MCHC 34.9 31.8 - 35.4 g/dL   RDW, POC 13.9 %   Platelet Count, POC 215 142 - 424 K/uL   MPV 7.3 0 - 99.8 fL  POCT Microscopic Urinalysis ([facility])  Result Value Ref Range   WBC,UR,HPF,POC None None WBC/hpf   RBC,UR,HPF,POC None None RBC/hpf   Bacteria None None, Too numerous to count   Mucus Absent Absent   Epithelial Cells, UR Per Microscopy Few (A) None, Too numerous to count cells/hpf  POCT urinalysis dipstick  Result Value Ref Range   Color, UA yellow yellow   Clarity, UA clear clear   Glucose, UA negative negative   Bilirubin, UA negative negative   Ketones, POC UA negative negative   Spec Grav, UA 1.020    Blood, UA trace-intact (A) negative   pH, UA 7.0    Protein Ur, POC negative negative   Urobilinogen, UA 0.2    Nitrite, UA Negative Negative   Leukocytes, UA Negative Negative  POCT Influenza A/B  Result Value Ref Range   Influenza A, POC Negative Negative   Influenza B, POC Negative Negative      Assessment and Plan: Rayola Everhart is a 66 y.o. female who is here today for cc of abdominal pain and diarrhea. Stool in office was formed with excessive urination, and I was instructed by lab that the specimen may not take due to  urine.  We will obtain urine culture with blood in urine.  Given stool culture kit to return if diarrhea continued.   It is concerning to have formed stool, after reporting 10 episodes of diarrhea.  Vitals wnl.  Stools normal.  orthostats obtained, and normal.     Lower abdominal pain - Plan: POCT CBC, POCT Microscopic Urinalysis ([facility]), POCT  urinalysis dipstick, POCT Influenza A/B, Hyoscyamine Sulfate SL (LEVSIN/SL) 0.125 MG SUBL, Urine culture, GI Profile, Stool, PCR, DISCONTINUED: ondansetron (ZOFRAN-ODT) 8 MG disintegrating tablet  Diarrhea, unspecified type - Plan: Hyoscyamine Sulfate SL (LEVSIN/SL) 0.125 MG SUBL, GI Profile, Stool, PCR, DISCONTINUED: ondansetron (ZOFRAN-ODT) 8 MG disintegrating tablet  Productive cough - Plan: POCT Influenza A/B  Nausea and vomiting, intractability of vomiting not specified, unspecified vomiting type - Plan: Urine culture, GI Profile, Stool, PCR, CANCELED: GI Profile, Stool, PCR  Ivar Drape, PA-C [facility] 2/2/20189:28 AM  Addendum 04/14/2016 9:50am: left message with norovirus gi stool result.  Advised hydration.  Also stated that she can try taking loperamide for 2 days as otc instruction.  If she continues to have symptoms, she can follow up with [place].  Advised contact precautions including not going near the hospital, contact with others, or preparing foods.  Advised soap and water hand washing, as well as cleaning with water and bleach.

## 2016-04-14 ENCOUNTER — Encounter: Payer: Self-pay | Admitting: Physician Assistant

## 2016-04-14 ENCOUNTER — Telehealth: Payer: Self-pay | Admitting: *Deleted

## 2016-04-14 LAB — GI PROFILE, STOOL, PCR

## 2016-04-14 LAB — URINE CULTURE: Organism ID, Bacteria: NO GROWTH

## 2016-04-14 NOTE — Telephone Encounter (Signed)
Thank you.  I left a rather detailed message on her phone about contact precautions 48-72 hours after symptoms resolve, including hospitals, baby, and preparing foods for others.  I also left one in her mychart.

## 2016-04-14 NOTE — Telephone Encounter (Signed)
Patient called stating she would like to talk to the nurse about the [name] virus, patient states the doctor called her and left her a message. Patient also has a new born baby in the family, patient wants to know the per cautions she needs to take for the baby so the baby doesn't get it.. Please advise [phone].

## 2016-04-14 NOTE — Telephone Encounter (Signed)
Pt advised she needs to practice good handwashing and minimize contact with baby. Stay hydrated with gatorade, rice, noodles and greenish bananas ok. Could restart probitoic too.

## 2016-05-19 DIAGNOSIS — K589 Irritable bowel syndrome without diarrhea: Secondary | ICD-10-CM | POA: Diagnosis not present

## 2016-05-19 DIAGNOSIS — N183 Chronic kidney disease, stage 3 (moderate): Secondary | ICD-10-CM | POA: Diagnosis not present

## 2016-05-19 DIAGNOSIS — M4800 Spinal stenosis, site unspecified: Secondary | ICD-10-CM | POA: Diagnosis not present

## 2016-05-19 DIAGNOSIS — I129 Hypertensive chronic kidney disease with stage 1 through stage 4 chronic kidney disease, or unspecified chronic kidney disease: Secondary | ICD-10-CM | POA: Diagnosis not present

## 2016-05-19 DIAGNOSIS — M797 Fibromyalgia: Secondary | ICD-10-CM | POA: Diagnosis not present

## 2016-05-19 DIAGNOSIS — R740 Nonspecific elevation of levels of transaminase and lactic acid dehydrogenase [LDH]: Secondary | ICD-10-CM | POA: Diagnosis not present

## 2016-05-19 DIAGNOSIS — M2550 Pain in unspecified joint: Secondary | ICD-10-CM | POA: Diagnosis not present

## 2016-05-19 DIAGNOSIS — F419 Anxiety disorder, unspecified: Secondary | ICD-10-CM | POA: Diagnosis not present

## 2016-05-19 DIAGNOSIS — E039 Hypothyroidism, unspecified: Secondary | ICD-10-CM | POA: Diagnosis not present

## 2016-05-19 DIAGNOSIS — M810 Age-related osteoporosis without current pathological fracture: Secondary | ICD-10-CM | POA: Diagnosis not present

## 2016-05-19 DIAGNOSIS — Z7189 Other specified counseling: Secondary | ICD-10-CM | POA: Diagnosis not present

## 2016-05-19 DIAGNOSIS — K2960 Other gastritis without bleeding: Secondary | ICD-10-CM | POA: Diagnosis not present

## 2016-05-29 DIAGNOSIS — M2550 Pain in unspecified joint: Secondary | ICD-10-CM | POA: Diagnosis not present

## 2016-05-29 DIAGNOSIS — R5383 Other fatigue: Secondary | ICD-10-CM | POA: Diagnosis not present

## 2016-05-29 DIAGNOSIS — E669 Obesity, unspecified: Secondary | ICD-10-CM | POA: Diagnosis not present

## 2016-05-29 DIAGNOSIS — M797 Fibromyalgia: Secondary | ICD-10-CM | POA: Diagnosis not present

## 2016-05-29 DIAGNOSIS — M150 Primary generalized (osteo)arthritis: Secondary | ICD-10-CM | POA: Diagnosis not present

## 2016-05-29 DIAGNOSIS — Z6835 Body mass index (BMI) 35.0-35.9, adult: Secondary | ICD-10-CM | POA: Diagnosis not present

## 2016-05-30 DIAGNOSIS — Z92241 Personal history of systemic steroid therapy: Secondary | ICD-10-CM | POA: Diagnosis not present

## 2016-05-30 DIAGNOSIS — Z9181 History of falling: Secondary | ICD-10-CM | POA: Diagnosis not present

## 2016-05-30 DIAGNOSIS — M8588 Other specified disorders of bone density and structure, other site: Secondary | ICD-10-CM | POA: Diagnosis not present

## 2016-05-30 DIAGNOSIS — E039 Hypothyroidism, unspecified: Secondary | ICD-10-CM | POA: Diagnosis not present

## 2016-05-30 DIAGNOSIS — Z8349 Family history of other endocrine, nutritional and metabolic diseases: Secondary | ICD-10-CM | POA: Diagnosis not present

## 2016-05-31 DIAGNOSIS — M8588 Other specified disorders of bone density and structure, other site: Secondary | ICD-10-CM | POA: Diagnosis not present

## 2016-06-02 DIAGNOSIS — L821 Other seborrheic keratosis: Secondary | ICD-10-CM | POA: Diagnosis not present

## 2016-06-02 DIAGNOSIS — Z85820 Personal history of malignant melanoma of skin: Secondary | ICD-10-CM | POA: Diagnosis not present

## 2016-06-02 DIAGNOSIS — D1801 Hemangioma of skin and subcutaneous tissue: Secondary | ICD-10-CM | POA: Diagnosis not present

## 2016-06-24 IMAGING — DX DG CHEST 2V
2 series · 2 of 2 positions shown · non-contrast
Comparison: 10/15/2014

CLINICAL DATA: Right side chest pain

EXAM:
CHEST  2 VIEW

[w chest lat]
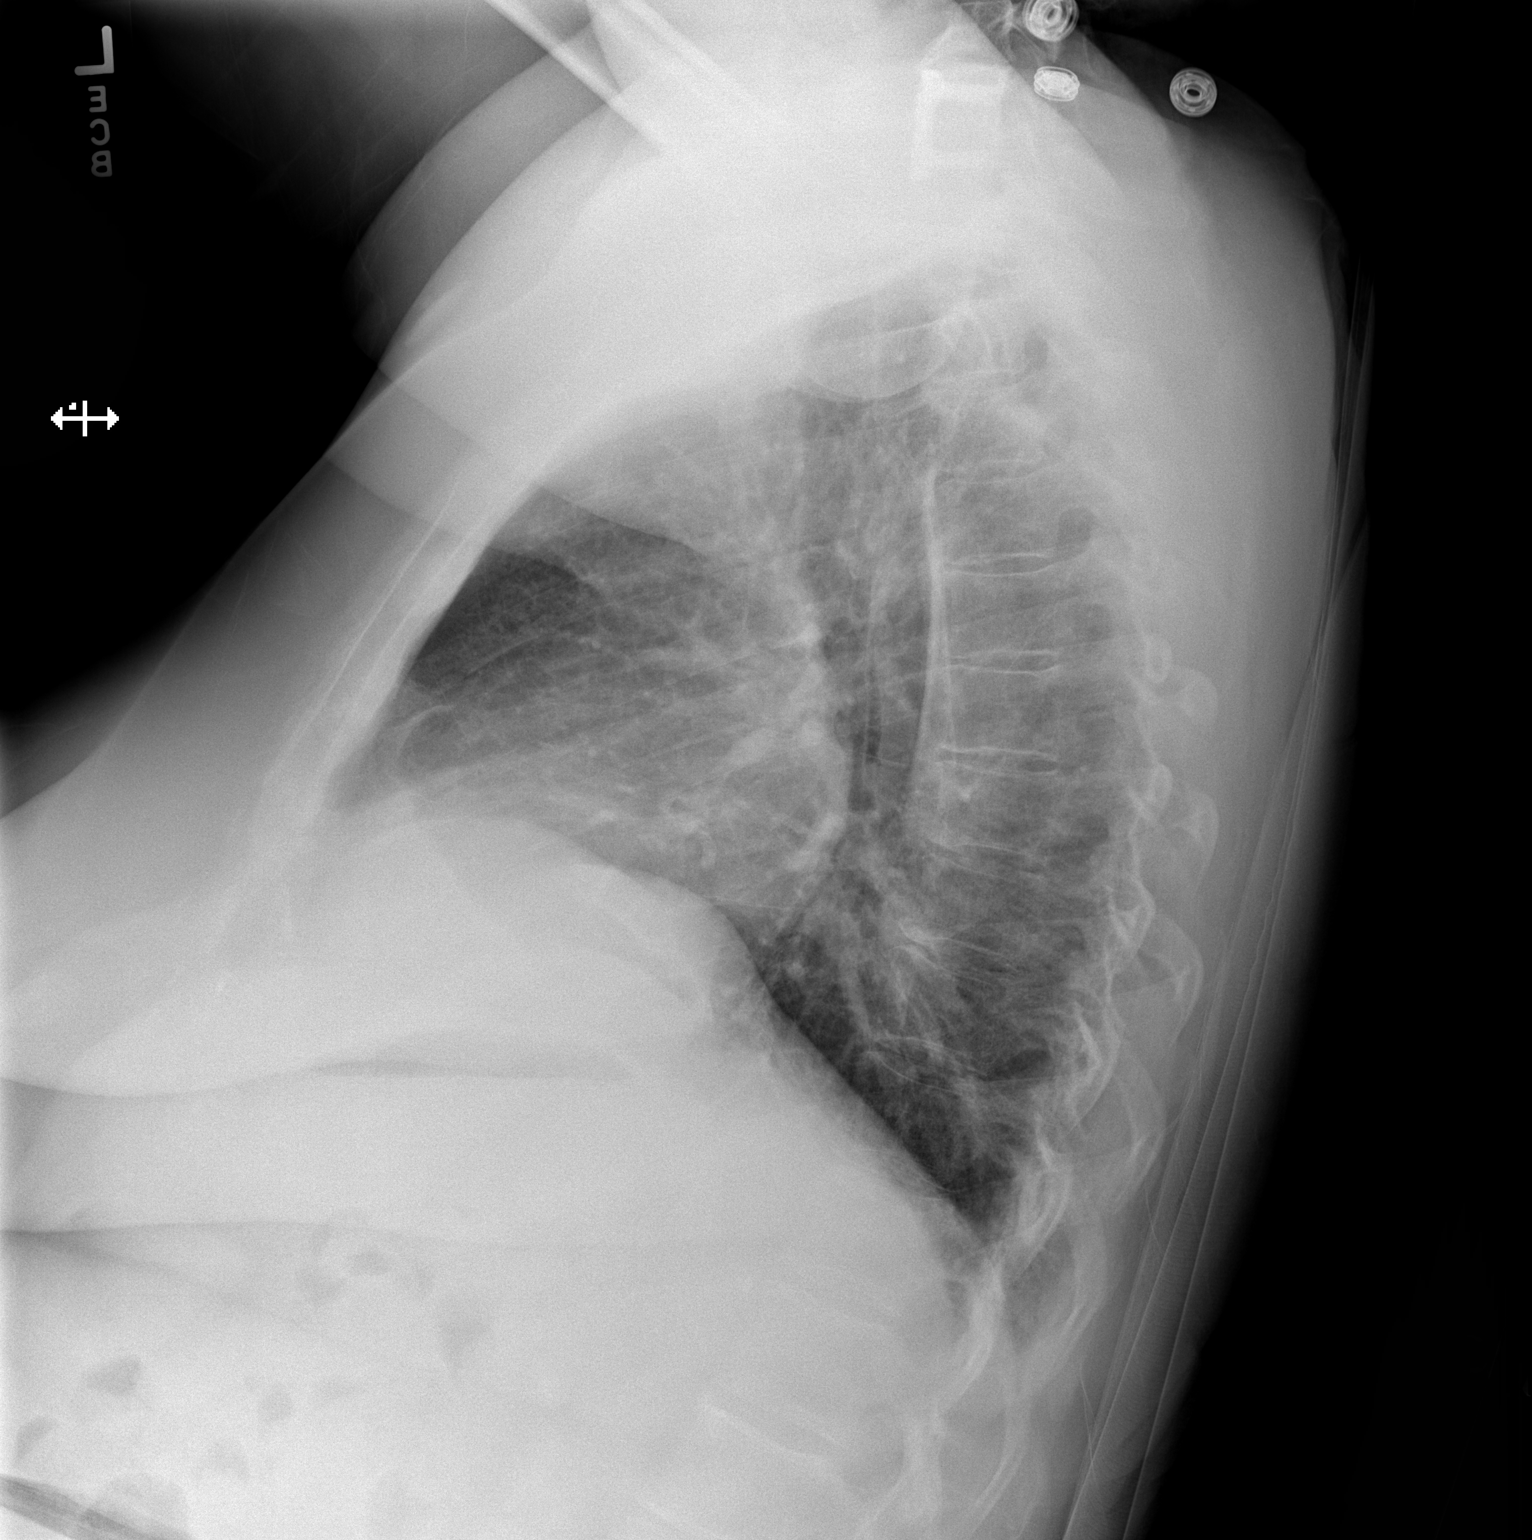

[x chest ap]
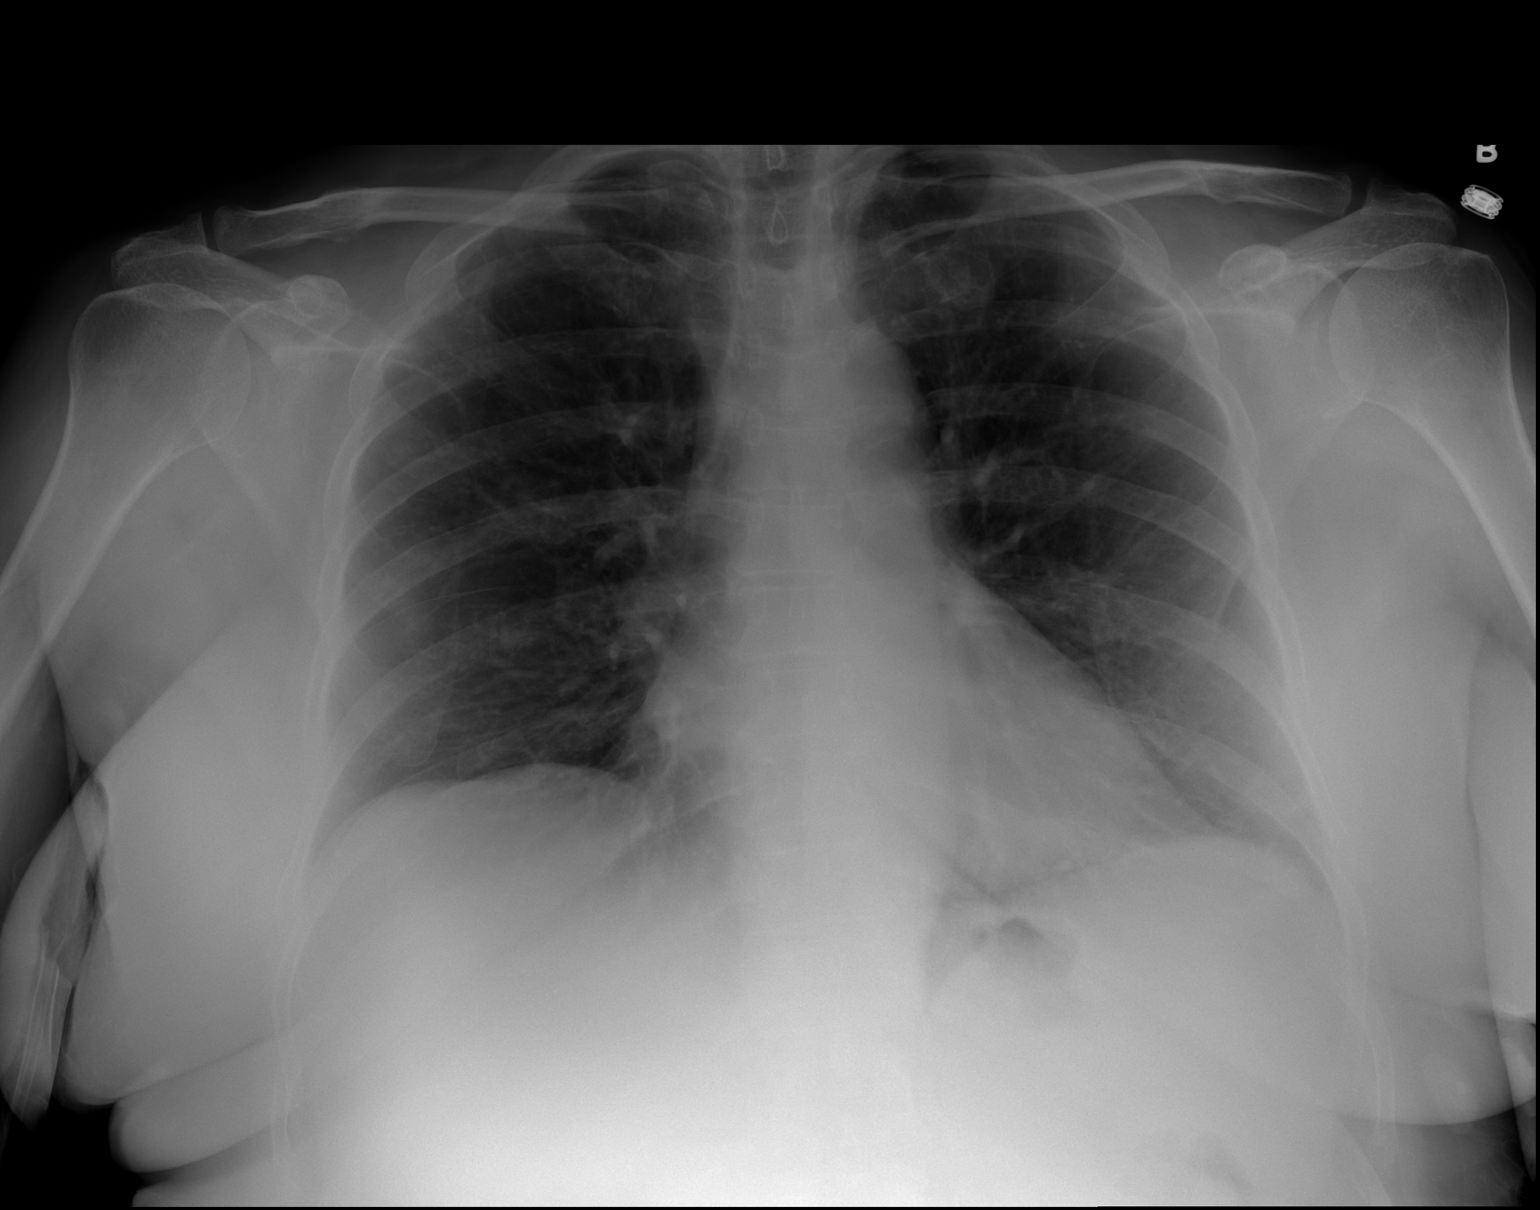

[2 of 2 positions shown; findings below may reference images not displayed]

FINDINGS: The heart size and mediastinal contours are within normal limits.
Both lungs are clear. The visualized skeletal structures are
unremarkable.
IMPRESSION: No active cardiopulmonary disease.

## 2016-07-25 ENCOUNTER — Encounter: Payer: Self-pay | Admitting: Physician Assistant

## 2016-07-25 ENCOUNTER — Ambulatory Visit (INDEPENDENT_AMBULATORY_CARE_PROVIDER_SITE_OTHER): Payer: Medicare Other | Admitting: Physician Assistant

## 2016-07-25 ENCOUNTER — Ambulatory Visit (INDEPENDENT_AMBULATORY_CARE_PROVIDER_SITE_OTHER): Payer: Medicare Other

## 2016-07-25 VITALS — BP 138/66 | HR 82 | Temp 98.1°F | Resp 16 | Ht 64.0 in | Wt 220.6 lb

## 2016-07-25 DIAGNOSIS — R0600 Dyspnea, unspecified: Secondary | ICD-10-CM | POA: Diagnosis not present

## 2016-07-25 DIAGNOSIS — R05 Cough: Secondary | ICD-10-CM

## 2016-07-25 DIAGNOSIS — R058 Other specified cough: Secondary | ICD-10-CM

## 2016-07-25 DIAGNOSIS — R0602 Shortness of breath: Secondary | ICD-10-CM | POA: Diagnosis not present

## 2016-07-25 MED ORDER — GUAIFENESIN ER 1200 MG PO TB12: 1.0000 | ORAL_TABLET | Freq: Two times a day (BID) | ORAL | 1 refills | Status: DC | PRN

## 2016-07-25 MED ORDER — CEFDINIR 300 MG PO CAPS: 300.0000 mg | ORAL_CAPSULE | Freq: Two times a day (BID) | ORAL | 0 refills | Status: DC

## 2016-07-25 NOTE — Patient Instructions (Addendum)
Please take medication as prescribed. You can try Delsym over the counter for cough.  You need to follow up with your primary care in regards to the swelling.   Also watch your salt intake.      IF you received an x-ray today, you will receive an invoice from [facility]. Please contact [facility] at [phone] with questions or concerns regarding your invoice.   IF you received labwork today, you will receive an invoice from [facility]. Please contact [facility] at [phone] with questions or concerns regarding your invoice.   Our billing staff will not be able to assist you with questions regarding bills from these companies.  You will be contacted with the lab results as soon as they are available. The fastest way to get your results is to activate your My Chart account. Instructions are located on the last page of this paperwork. If you have not heard from [place] regarding the results in 2 weeks, please contact this office.

## 2016-07-26 LAB — BASIC METABOLIC PANEL
BUN/Creatinine Ratio: 14 (ref 12–28)
BUN: 13 mg/dL (ref 8–27)
CO2: 21 mmol/L (ref 18–29)
Calcium: 9.7 mg/dL (ref 8.7–10.3)
Chloride: 101 mmol/L (ref 96–106)
Creatinine, Ser: 0.93 mg/dL (ref 0.57–1.00)
GFR calc Af Amer: 75 mL/min/{1.73_m2} (ref >59)
GFR calc non Af Amer: 65 mL/min/{1.73_m2} (ref >59)
Glucose: 106 mg/dL — ABNORMAL HIGH (ref 65–99)
Potassium: 4.7 mmol/L (ref 3.5–5.2)
Sodium: 140 mmol/L (ref 134–144)

## 2016-07-26 LAB — CBC
Hematocrit: 44 % (ref 34.0–46.6)
Hemoglobin: 14.8 g/dL (ref 11.1–15.9)
MCH: 29 pg (ref 26.6–33.0)
MCHC: 33.6 g/dL (ref 31.5–35.7)
MCV: 86 fL (ref 79–97)
Platelets: 278 10*3/uL (ref 150–379)
RBC: 5.11 x10E6/uL (ref 3.77–5.28)
RDW: 14.5 % (ref 12.3–15.4)
WBC: 10.2 10*3/uL (ref 3.4–10.8)

## 2016-07-26 LAB — PRO B NATRIURETIC PEPTIDE: NT-Pro BNP: 119 pg/mL (ref 0–301)

## 2016-07-26 NOTE — Progress Notes (Signed)
PRIMARY CARE AT [facility] [address] [place] [address] [phone]  Date:  07/25/2016   Name:  [name]   DOB:  13-Jul-1950   MRN:  [id number]  PCP:  Kelton Pillar, MD    History of Present Illness:  [name] is a 66 y.o. female patient who presents to PCP with  Chief Complaint  Patient presents with  . Nasal Congestion    Symptoms began Sunday evening   . Cough    productive     Patient reports nasal congestion and productive cough that started 3 days ago and has progressively worsened.  She has had subjective fever, achey, coughing and sneezing.  She has significant pain at her head and chest.  She has post nasal drainage that runs down, and noticed that the mucus that she would cough up is thick and stringy. The congestion she feels at times in her chest as well.  She is sitting in a recliner at bedtime for relief of this. She also noticed that her feet were swelling bilaterally and hurt.  She just returned from a cruise and states it was worse there.  No sedentary positioning, but did have to walk a lot with excursions.  She is taking the metoprolol succinate, and removed from lasix.  No use of compression stockings.     Patient Active Problem List   Diagnosis Date Noted  . Melanoma ([place]) 11/19/2015  . SVT (supraventricular tachycardia) ([place]) 05/20/2015  . Chronic venous insufficiency 10/10/2013  . Neuropathy 04/23/2013  . Psoriasis 04/23/2013  . Deformity of joint 04/23/2013  . 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 Peripheral cyanosis 09/28/2012  . Arthritis of knee, degenerative 09/02/2012  . Routine health maintenance 08/17/2011  . Anemia, iron deficiency 01/22/2011  . Chronic pain 09/22/2010  . Depression with anxiety 09/21/2010  . Spinal stenosis 08/14/2010  . Hypertension 07/08/2010  . Migraine 07/08/2010  . Knee internal derangement 07/08/2010  . Non-cardiac chest pain 07/08/2010    Past Medical History:  Diagnosis  Date  . Allergy   . Arthritis   . Chronic kidney disease   . Collagen vascular disease ([place])   . Deformity of joint 04/23/2013  . Derangement of knee, left 2009   started with fall at work. Has had repair patella and torn meniscus  . High cholesterol   . Hx of appendectomy   . Hypertension   . Melanoma ([place]) 11/19/2015  . Migraine headache    hormonal related - less frequent off hormone replacement  . Migraines   . Neuropathy 04/23/2013  . Psoriasis 04/23/2013  . Raynaud disease   . SVT (supraventricular tachycardia) ([place])   . Synovial cyst of lumbar facet joint    lower back - s/p surgery     Past Surgical History:  Procedure Laterality Date  . APPENDECTOMY  1971  . CERVICAL DISCECTOMY  1999   diskectomy with fixation:plate and screws  . KNEE ARTHROSCOPY W/ MENISCAL REPAIR  09   left knee  . L4-5 lumbar fusion  September 02, 2010   Dr. Hal Neer:  minimally invasive transforaminal interbody fusion with spacer  . OVARIAN CYST SURGERY  1972  . PATELLAR REEFING  '09   repair of fractured patella after fall  . SPINE SURGERY    . SYNOVIAL CYST EXCISION  2007   lumbar spine  . VENOUS ABLATION     for pain in the groin - VVTS did procedure. Has some residual discomfort at the  ablation site.  Social History  Substance Use Topics  . Smoking status: Never Smoker  . Smokeless tobacco: Never Used  . Alcohol use No    Family History  Problem Relation Age of Onset  . Diabetes Mother   . COPD Mother   . Aortic aneurysm Mother   . Hypertension Mother   . Hypothyroidism Mother   . Cancer Father        lung  . Hyperlipidemia Father   . Hypertension Father   . Heart disease Father   . Diabetes Father   . Diabetes Brother     Allergies  Allergen Reactions  . Lyrica [Pregabalin] Anaphylaxis  . Nsaids Swelling    swelling  . Tolmetin Swelling  . Prednisone Swelling  . Tape Other (See Comments)    *adhesive* Reaction- blisters   . Captopril   . Lisinopril Swelling  .  Caine-1 [Lidocaine Hcl] Palpitations  . Celebrex [Celecoxib] Palpitations  . Codeine Palpitations  . Nephron [Epinephrine] Palpitations    NASAL SPRAY  . Novocain [Procaine] Other (See Comments)    dizziness  . Other Palpitations    Glucocorticoids/Steroids  . Procaine Hcl Palpitations  . Sulfa Antibiotics Palpitations    Medication list has been reviewed and updated.  Current Outpatient Prescriptions on File Prior to Visit  Medication Sig Dispense Refill  . amitriptyline (ELAVIL) 10 MG tablet Take 4 tablets (40 mg total) by mouth at bedtime. (Patient taking differently: Take 20 mg by mouth at bedtime. ) 120 tablet 0  . aspirin EC 81 MG tablet Take 81 mg by mouth every other day.     . B Complex Vitamins (VITAMIN B COMPLEX PO) Take 1 tablet by mouth daily.     . clonazePAM (KLONOPIN) 1 MG tablet Take 1 tablet (1 mg total) by mouth at bedtime as needed for anxiety. (Patient taking differently: Take 1 mg by mouth at bedtime. ) 30 tablet 0  . Hyoscyamine Sulfate SL (LEVSIN/SL) 0.125 MG SUBL Place 0.125 mg under the tongue 4 (four) times daily. 40 each 0  . levothyroxine (SYNTHROID, LEVOTHROID) 25 MCG tablet Take 75 mcg by mouth daily before breakfast.     . METOPROLOL SUCCINATE ER PO Take 25 mg by mouth daily.     . Multiple Vitamins-Minerals (ZINC PO) Take by mouth.    Marland Kitchen tiZANidine (ZANAFLEX) 2 MG tablet Take 2 mg by mouth 2 (two) times daily. Reported on 04/05/2015    . traMADol (ULTRAM) 50 MG tablet Take by mouth every 6 (six) hours as needed.    Marland Kitchen acetaminophen (TYLENOL) 500 MG tablet Take 500 mg by mouth every 6 (six) hours as needed for mild pain, moderate pain or headache.     . naproxen (NAPROSYN) 375 MG tablet Take by mouth.    . ondansetron (ZOFRAN-ODT) 8 MG disintegrating tablet Take 1 tablet (8 mg total) by mouth every 8 (eight) hours as needed for nausea. (Patient not taking: Reported on 07/25/2016) 20 tablet 0  . sucralfate (CARAFATE) 1 G tablet Take 1 g by mouth daily.    .  vitamin E 400 UNIT capsule Take 400 Units by mouth every other day. Reported on 04/05/2015     No current facility-administered medications on file prior to visit.     ROS ROS otherwise unremarkable unless listed above.  Physical Examination: BP 138/66   Pulse 82   Temp 98.1 F (36.7 C) (Oral)   Resp 16   Ht 5\' 4"  (1.626 m)   Wt 220 lb 9.6 oz (  100.1 kg)   SpO2 98%   BMI 37.87 kg/m  Ideal Body Weight: Weight in (lb) to have BMI = 25: 145.3  Physical Exam  Constitutional: She is oriented to person, place, and time. She appears well-developed and well-nourished. No distress.  HENT:  Head: Normocephalic and atraumatic.  Right Ear: Tympanic membrane, external ear and ear canal normal.  Left Ear: Tympanic membrane, external ear and ear canal normal.  Nose: Mucosal edema and rhinorrhea present. Right sinus exhibits no maxillary sinus tenderness and no frontal sinus tenderness. Left sinus exhibits maxillary sinus tenderness. Left sinus exhibits no frontal sinus tenderness.  Mouth/Throat: No uvula swelling. No oropharyngeal exudate, posterior oropharyngeal edema or posterior oropharyngeal erythema.  Eyes: Conjunctivae and EOM are normal. Pupils are equal, round, and reactive to light.  Cardiovascular: Normal rate and regular rhythm.  Exam reveals no gallop, no distant heart sounds and no friction rub.   No murmur heard. Pulses:      Radial pulses are 2+ on the right side, and 2+ on the left side.       Dorsalis pedis pulses are 2+ on the right side, and 2+ on the left side.  Minimal lower extremity non-pitting edema.   Pulmonary/Chest: Effort normal. No respiratory distress. She has no decreased breath sounds. She has no wheezes. She has no rhonchi.  Lymphadenopathy:       Head (right side): No submandibular, no tonsillar, no preauricular and no posterior auricular adenopathy present.       Head (left side): No submandibular, no tonsillar, no preauricular and no posterior auricular  adenopathy present.  Neurological: She is alert and oriented to person, place, and time.  Skin: She is not diaphoretic.  Psychiatric: She has a normal mood and affect. Her behavior is normal.   Results for orders placed or performed in visit on 07/25/16  Pro b natriuretic peptide  Result Value Ref Range   NT-Pro BNP 119 0 - 301 pg/mL  CBC  Result Value Ref Range   WBC 10.2 3.4 - 10.8 x10E3/uL   RBC 5.11 3.77 - 5.28 x10E6/uL   Hemoglobin 14.8 11.1 - 15.9 g/dL   Hematocrit 44.0 34.0 - 46.6 %   MCV 86 79 - 97 fL   MCH 29.0 26.6 - 33.0 pg   MCHC 33.6 31.5 - 35.7 g/dL   RDW 14.5 12.3 - 15.4 %   Platelets 278 150 - 379 [id number]/BB  Basic metabolic panel  Result Value Ref Range   Glucose 106 (H) 65 - 99 mg/dL   BUN 13 8 - 27 mg/dL   Creatinine, Ser 0.93 0.57 - 1.00 mg/dL   GFR calc non Af Amer 65 >59 mL/min/1.73   GFR calc Af Amer 75 >59 mL/min/1.73   BUN/Creatinine Ratio 14 12 - 28   Sodium 140 134 - 144 mmol/L   Potassium 4.7 3.5 - 5.2 mmol/L   Chloride 101 96 - 106 mmol/L   CO2 21 18 - 29 mmol/L   Calcium 9.7 8.7 - 10.3 mg/dL   Dg Chest 2 View  Result Date: 07/25/2016 CLINICAL DATA:  Cough, shortness of Breath EXAM: CHEST  2 VIEW COMPARISON:  11/25/2014 FINDINGS: Heart and mediastinal contours are within normal limits. No focal opacities or effusions. No acute bony abnormality. IMPRESSION: No active cardiopulmonary disease. Electronically Signed   By: Rolm Baptise M.D.   On: 07/25/2016 10:25      Assessment and Plan: Laterria Lasota is a 66 y.o. female who is here today for  cc of cough, and leg swelling.   Likely not heart failure, but more respiratory infection.  Will treat at this time.  Advised to use compression stockings at this time.  And watch salt intake. Advised to report presence to pcp.  She voiced understanding. She will follow up with cardiologist.   Alarming sxs to warrant immediate return were discussed. Advised to continue claritin.  Will start mucinex,  and cefdinir.   Productive cough - Plan: Pro b natriuretic peptide, DG Chest 2 View, CBC, Basic metabolic panel  Dyspnea, unspecified type - Plan: Basic metabolic panel  Ivar Drape, PA-C [facility] 5/16/20188:38 AM

## 2016-07-31 ENCOUNTER — Encounter: Payer: Self-pay | Admitting: Vascular Surgery

## 2016-07-31 DIAGNOSIS — M25562 Pain in left knee: Secondary | ICD-10-CM | POA: Diagnosis not present

## 2016-07-31 DIAGNOSIS — M25561 Pain in right knee: Secondary | ICD-10-CM | POA: Diagnosis not present

## 2016-08-16 DIAGNOSIS — R635 Abnormal weight gain: Secondary | ICD-10-CM | POA: Diagnosis not present

## 2016-08-16 DIAGNOSIS — E039 Hypothyroidism, unspecified: Secondary | ICD-10-CM | POA: Diagnosis not present

## 2016-08-16 DIAGNOSIS — Z92241 Personal history of systemic steroid therapy: Secondary | ICD-10-CM | POA: Diagnosis not present

## 2016-08-16 DIAGNOSIS — M810 Age-related osteoporosis without current pathological fracture: Secondary | ICD-10-CM | POA: Diagnosis not present

## 2016-08-16 DIAGNOSIS — Z8349 Family history of other endocrine, nutritional and metabolic diseases: Secondary | ICD-10-CM | POA: Diagnosis not present

## 2016-08-16 DIAGNOSIS — M8588 Other specified disorders of bone density and structure, other site: Secondary | ICD-10-CM | POA: Diagnosis not present

## 2016-08-28 DIAGNOSIS — R32 Unspecified urinary incontinence: Secondary | ICD-10-CM | POA: Diagnosis not present

## 2016-08-28 DIAGNOSIS — M170 Bilateral primary osteoarthritis of knee: Secondary | ICD-10-CM | POA: Diagnosis not present

## 2016-08-28 DIAGNOSIS — N182 Chronic kidney disease, stage 2 (mild): Secondary | ICD-10-CM | POA: Diagnosis not present

## 2016-08-28 DIAGNOSIS — I129 Hypertensive chronic kidney disease with stage 1 through stage 4 chronic kidney disease, or unspecified chronic kidney disease: Secondary | ICD-10-CM | POA: Diagnosis not present

## 2016-09-04 DIAGNOSIS — M797 Fibromyalgia: Secondary | ICD-10-CM | POA: Diagnosis not present

## 2016-09-04 DIAGNOSIS — M545 Low back pain: Secondary | ICD-10-CM | POA: Diagnosis not present

## 2016-09-04 DIAGNOSIS — I129 Hypertensive chronic kidney disease with stage 1 through stage 4 chronic kidney disease, or unspecified chronic kidney disease: Secondary | ICD-10-CM | POA: Diagnosis not present

## 2016-09-04 DIAGNOSIS — Z6837 Body mass index (BMI) 37.0-37.9, adult: Secondary | ICD-10-CM | POA: Diagnosis not present

## 2016-09-04 DIAGNOSIS — K589 Irritable bowel syndrome without diarrhea: Secondary | ICD-10-CM | POA: Diagnosis not present

## 2016-09-04 DIAGNOSIS — N183 Chronic kidney disease, stage 3 (moderate): Secondary | ICD-10-CM | POA: Diagnosis not present

## 2016-09-04 DIAGNOSIS — G629 Polyneuropathy, unspecified: Secondary | ICD-10-CM | POA: Diagnosis not present

## 2016-09-04 DIAGNOSIS — F419 Anxiety disorder, unspecified: Secondary | ICD-10-CM | POA: Diagnosis not present

## 2016-09-04 DIAGNOSIS — E039 Hypothyroidism, unspecified: Secondary | ICD-10-CM | POA: Diagnosis not present

## 2016-09-04 DIAGNOSIS — M810 Age-related osteoporosis without current pathological fracture: Secondary | ICD-10-CM | POA: Diagnosis not present

## 2016-09-07 DIAGNOSIS — H25013 Cortical age-related cataract, bilateral: Secondary | ICD-10-CM | POA: Diagnosis not present

## 2016-09-07 DIAGNOSIS — M25561 Pain in right knee: Secondary | ICD-10-CM | POA: Diagnosis not present

## 2016-09-07 DIAGNOSIS — H524 Presbyopia: Secondary | ICD-10-CM | POA: Diagnosis not present

## 2016-09-07 DIAGNOSIS — M25562 Pain in left knee: Secondary | ICD-10-CM | POA: Diagnosis not present

## 2016-09-07 DIAGNOSIS — Z1231 Encounter for screening mammogram for malignant neoplasm of breast: Secondary | ICD-10-CM | POA: Diagnosis not present

## 2016-09-14 DIAGNOSIS — M170 Bilateral primary osteoarthritis of knee: Secondary | ICD-10-CM | POA: Diagnosis not present

## 2016-09-20 ENCOUNTER — Encounter: Payer: Self-pay | Admitting: Physical Therapy

## 2016-09-20 ENCOUNTER — Ambulatory Visit: Payer: Medicare Other | Attending: Orthopedic Surgery | Admitting: Physical Therapy

## 2016-09-20 DIAGNOSIS — R262 Difficulty in walking, not elsewhere classified: Secondary | ICD-10-CM

## 2016-09-20 DIAGNOSIS — G8929 Other chronic pain: Secondary | ICD-10-CM

## 2016-09-20 DIAGNOSIS — M25562 Pain in left knee: Secondary | ICD-10-CM | POA: Diagnosis not present

## 2016-09-20 DIAGNOSIS — M25561 Pain in right knee: Secondary | ICD-10-CM | POA: Diagnosis not present

## 2016-09-20 DIAGNOSIS — M62830 Muscle spasm of back: Secondary | ICD-10-CM | POA: Diagnosis not present

## 2016-09-20 DIAGNOSIS — M5441 Lumbago with sciatica, right side: Secondary | ICD-10-CM | POA: Diagnosis not present

## 2016-09-20 NOTE — Therapy (Signed)
[place], [place], [address] Phone: [phone]   Fax:  [phone]  Physical Therapy Evaluation  Patient Details  Name: [name] MRN: [id number] Date of Birth: 28-Dec-1950 Referring Provider: Noemi Chapel  Encounter Date: 09/20/2016      PT End of Session - 09/20/16 0915    Visit Number 1   Date for PT Re-Evaluation 11/21/16   PT Start Time 0830   PT Stop Time 0930   PT Time Calculation (min) 60 min   Activity Tolerance Patient tolerated treatment well   Behavior During Therapy [facility] for tasks assessed/performed      Past Medical History:  Diagnosis Date  . Allergy   . Arthritis   . Chronic kidney disease   . Collagen vascular disease ([place])   . Deformity of joint 04/23/2013  . Derangement of knee, left 2009   started with fall at work. Has had repair patella and torn meniscus  . High cholesterol   . Hx of appendectomy   . Hypertension   . Melanoma ([place]) 11/19/2015  . Migraine headache    hormonal related - less frequent off hormone replacement  . Migraines   . Neuropathy 04/23/2013  . Psoriasis 04/23/2013  . Raynaud disease   . SVT (supraventricular tachycardia) ([place])   . Synovial cyst of lumbar facet joint    lower back - s/p surgery     Past Surgical History:  Procedure Laterality Date  . APPENDECTOMY  1971  . CERVICAL DISCECTOMY  1999   diskectomy with fixation:plate and screws  . KNEE ARTHROSCOPY W/ MENISCAL REPAIR  09   left knee  . L4-5 lumbar fusion  September 02, 2010   Dr. Hal Neer:  minimally invasive transforaminal interbody fusion with spacer  . OVARIAN CYST SURGERY  1972  . PATELLAR REEFING  '09   repair of fractured patella after fall  . SPINE SURGERY    . SYNOVIAL CYST EXCISION  2007   lumbar spine  . VENOUS ABLATION     for pain in the groin - VVTS did procedure. Has some residual discomfort at the  ablation site.     There were no vitals filed for this  visit.       Subjective Assessment - 09/20/16 0830    Subjective Patient comes in with back pain and knee pain.  Reports that she has a long history of back and knee pain.  Reports that she has gotten weaker and with worse pain in the past 6-12 months.  X-rays show DDD and arthritis/stenosis in the back, DJD and OA in the knees.   Pertinent History reports 4 spinal surgeries and a fusion, 2012.  Reports 2 past knee surgeries on the left knee, venous insufficiency   Limitations Sitting;Lifting;Standing;Walking;House hold activities   Patient Stated Goals have less pain, have better strength and function, squat   Currently in Pain? Yes   Pain Score 7    Pain Location Knee  and back   Pain Orientation Left;Right;Lower   Pain Descriptors / Indicators Aching;Sharp   Pain Type Chronic pain   Pain Onset More than a month ago   Pain Frequency Constant   Aggravating Factors  standing, walking, ADL's all will increase pain to 10/10   Pain Relieving Factors pain meds helps, heat helps pain down to 5/10   Effect of Pain on Daily Activities limits all ADL's. reports that she cannot be active very long  [facility] PT Assessment - 09/20/16 0001      Assessment   Medical Diagnosis back and knee pain   Referring Provider [place]   Onset Date/Surgical Date 08/21/16   Prior Therapy none in the past 2 years     Precautions   Precautions None     Balance Screen   Has the patient fallen in the past 6 months Yes   How many times? 1   Has the patient had a decrease in activity level because of a fear of falling?  No   Is the patient reluctant to leave their home because of a fear of falling?  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   Leisure keeps a 20# grandchild, no exercise      Sensation   Additional Comments reports burning and numbness in the legs, especially the left leg being numb     Posture/Postural Control   Posture Comments fwd head, rounded shoulders     ROM / Strength   AROM / PROM / Strength AROM;Strength     AROM   Overall AROM Comments Lumbar ROM decreased 50% with pain in the low back,    AROM Assessment Site Knee   Right/Left Knee Right;Left   Right Knee Extension 0   Right Knee Flexion 110   Left Knee Extension 0   Left Knee Flexion 94     Strength   Overall Strength Comments knees 4/5, hips 4-/5 with pain in the back     Palpation   Palpation comment tender patellar tendons, tender in the low back and SI, tender GT areas and ITB     Ambulation/Gait   Gait Comments no device, antalgic on the left, she is very slow the first few steps and is forward flexed, has some loss of balance with turning            Objective measurements completed on examination: See above findings.          [facility] Adult PT Treatment/Exercise - 09/20/16 0001      Modalities   Modalities Moist Heat;Electrical Stimulation     Moist Heat Therapy   Number Minutes Moist Heat 15 Minutes   Moist Heat Location Lumbar Spine     Electrical Stimulation   Electrical Stimulation Location lumbar spine   Electrical Stimulation Action IFC   Electrical Stimulation Parameters supine   Electrical Stimulation Goals Pain                PT Education - 09/20/16 0914    Education provided Yes   Education Details Wms flexion   Person(s) Educated Patient   Methods Explanation;Demonstration;Handout;Tactile cues;Verbal cues   Comprehension Verbalized understanding          PT Short Term Goals - 09/20/16 0925      PT SHORT TERM GOAL #1   Title independent with initial HEP   Time 2   Period Weeks   Status New           PT Long Term Goals - 09/20/16 8099      PT LONG TERM GOAL #1   Title decrease pain in the back 25%   Time 8   Period  Weeks   Status New     PT LONG TERM GOAL #  2   Title decrease left knee pain 25%   Time 8   Period Weeks   Status New     PT LONG TERM GOAL #3   Title report able to lift grandchild without issue   Time 8   Period Weeks   Status New     PT LONG TERM GOAL #4   Title understand proper posture and body mechanics   Time 8   Period Weeks   Status New     PT LONG TERM GOAL #5   Title independent with an advanced HEP or return to gym or pool program   Time 8   Period Weeks   Status New                Plan - 09/20/16 0915    Clinical Impression Statement Patient has a myriad of c/o pain, mostly in the low back and the knees, her right leg she reports is numb from a past back surgery, her left knee is weak, she has had 4 lumbar surgeries and reports a "disc issues" at this time.  She reports that over the past 6-12 months she has been having increased pain and having difficulty with ADL's, she has a grandchild that she would like to get in the floor to play with but can't and reports difficulty lifting the grandchild,  she is weak in the core, she is very tight in the LE's especially the ITB and the left ITB.  Lumbar ROM was decreased 50%   History and Personal Factors relevant to plan of care: Has a lot of past medical history that will negatively affect the progression of PT   Clinical Presentation Evolving   Clinical Decision Making Moderate   Rehab Potential Good   PT Frequency 2x / week   PT Duration 8 weeks   PT Treatment/Interventions ADLs/Self Care Home Management;Cryotherapy;Electrical Stimulation;Iontophoresis '4mg'$ /ml Dexamethasone;Moist Heat;Ultrasound;Stair training;Functional mobility training;Patient/family education;Therapeutic exercise;Therapeutic activities;Manual techniques   PT Next Visit Plan Slowly start gym exercises, flexibility   PT Home Exercise Plan gave Wms flexion, will need some flexibility for HEP in the future   Consulted and Agree with Plan of Care  Patient      Patient will benefit from skilled therapeutic intervention in order to improve the following deficits and impairments:  Abnormal gait, Cardiopulmonary status limiting activity, Decreased activity tolerance, Decreased mobility, Decreased strength, Pain, Decreased range of motion, Difficulty walking, Increased muscle spasms  Visit Diagnosis: Chronic bilateral low back pain with right-sided sciatica - Plan: PT plan of care cert/re-cert  Muscle spasm of back - Plan: PT plan of care cert/re-cert  Chronic pain of left knee - Plan: PT plan of care cert/re-cert  Chronic pain of right knee - Plan: PT plan of care cert/re-cert  Difficulty in walking, not elsewhere classified - Plan: PT plan of care cert/re-cert      G-Codes - 32/44/01 0932    Functional Assessment Tool Used (Outpatient Only) foto 69% limitation   Functional Limitation Mobility: Walking and moving around   Mobility: Walking and Moving Around Current Status ([id number]) At least 60 percent but less than 80 percent impaired, limited or restricted   Mobility: Walking and Moving Around Goal Status [phone]) At least 40 percent but less than 60 percent impaired, limited or restricted       Problem List Patient Active Problem List   Diagnosis Date Noted  . Melanoma ([place]) 11/19/2015  . SVT (supraventricular tachycardia) ([place]) 05/20/2015  . Chronic venous insufficiency 10/10/2013  .  Neuropathy 04/23/2013  . Psoriasis 04/23/2013  . Deformity of joint 04/23/2013  . 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 Peripheral cyanosis 09/28/2012  . Arthritis of knee, degenerative 09/02/2012  . Routine health maintenance 08/17/2011  . Anemia, iron deficiency 01/22/2011  . Chronic pain 09/22/2010  . Depression with anxiety 09/21/2010  . Spinal stenosis 08/14/2010  . Hypertension 07/08/2010  . Migraine 07/08/2010  . Knee internal derangement 07/08/2010  . Non-cardiac chest pain 07/08/2010    Sumner Boast.,  PT 09/20/2016, 9:38 AM  [place], [place], [address] Phone: [phone]   Fax:  [phone]  Name: [name] MRN: [id number] Date of Birth: 11/25/1950

## 2016-09-22 DIAGNOSIS — I129 Hypertensive chronic kidney disease with stage 1 through stage 4 chronic kidney disease, or unspecified chronic kidney disease: Secondary | ICD-10-CM | POA: Diagnosis not present

## 2016-09-22 DIAGNOSIS — Z6836 Body mass index (BMI) 36.0-36.9, adult: Secondary | ICD-10-CM | POA: Diagnosis not present

## 2016-09-22 DIAGNOSIS — R32 Unspecified urinary incontinence: Secondary | ICD-10-CM | POA: Diagnosis not present

## 2016-09-22 DIAGNOSIS — M170 Bilateral primary osteoarthritis of knee: Secondary | ICD-10-CM | POA: Diagnosis not present

## 2016-09-22 DIAGNOSIS — N182 Chronic kidney disease, stage 2 (mild): Secondary | ICD-10-CM | POA: Diagnosis not present

## 2016-09-26 ENCOUNTER — Ambulatory Visit: Payer: Medicare Other | Admitting: Physical Therapy

## 2016-09-26 ENCOUNTER — Encounter: Payer: Self-pay | Admitting: Physical Therapy

## 2016-09-26 DIAGNOSIS — M25561 Pain in right knee: Secondary | ICD-10-CM | POA: Diagnosis not present

## 2016-09-26 DIAGNOSIS — M62830 Muscle spasm of back: Secondary | ICD-10-CM | POA: Diagnosis not present

## 2016-09-26 DIAGNOSIS — M5441 Lumbago with sciatica, right side: Secondary | ICD-10-CM | POA: Diagnosis not present

## 2016-09-26 DIAGNOSIS — R262 Difficulty in walking, not elsewhere classified: Secondary | ICD-10-CM | POA: Diagnosis not present

## 2016-09-26 DIAGNOSIS — M25562 Pain in left knee: Secondary | ICD-10-CM

## 2016-09-26 DIAGNOSIS — G8929 Other chronic pain: Secondary | ICD-10-CM | POA: Diagnosis not present

## 2016-09-26 NOTE — Therapy (Signed)
[name], [place], [address] Phone: [phone]   Fax:  [phone]  Physical Therapy Treatment  Patient Details  Name: [name] MRN: [id number] Date of Birth: [birth year] Referring Provider: Noemi Chapel  Encounter Date: 09/26/2016      PT End of Session - 09/26/16 1114    Visit Number 2   Date for PT Re-Evaluation 11/21/16   PT Start Time 1009   PT Stop Time 1115   PT Time Calculation (min) 66 min   Activity Tolerance Patient tolerated treatment well   Behavior During Therapy [facility] for tasks assessed/performed      Past Medical History:  Diagnosis Date  . Allergy   . Arthritis   . Chronic kidney disease   . Collagen vascular disease ([place])   . Deformity of joint 04/23/2013  . Derangement of knee, left 2009   started with fall at work. Has had repair patella and torn meniscus  . High cholesterol   . Hx of appendectomy   . Hypertension   . Melanoma ([place]) 11/19/2015  . Migraine headache    hormonal related - less frequent off hormone replacement  . Migraines   . Neuropathy 04/23/2013  . Psoriasis 04/23/2013  . Raynaud disease   . SVT (supraventricular tachycardia) ([place])   . Synovial cyst of lumbar facet joint    lower back - s/p surgery     Past Surgical History:  Procedure Laterality Date  . APPENDECTOMY  1971  . CERVICAL DISCECTOMY  1999   diskectomy with fixation:plate and screws  . KNEE ARTHROSCOPY W/ MENISCAL REPAIR  09   left knee  . L4-5 lumbar fusion  September 02, 2010   Dr. Hal Neer:  minimally invasive transforaminal interbody fusion with spacer  . OVARIAN CYST SURGERY  1972  . PATELLAR REEFING  '09   repair of fractured patella after fall  . SPINE SURGERY    . SYNOVIAL CYST EXCISION  2007   lumbar spine  . VENOUS ABLATION     for pain in the groin - VVTS did procedure. Has some residual discomfort at the  ablation site.     There were no vitals filed for this  visit.      Subjective Assessment - 09/26/16 1011    Subjective Patient reports that over the weekend she tried to do a lot more and is very sore and tired.  Reports that she did a lot of lifting, reports that she did a lot of spraying in the yard and walking.   Currently in Pain? Yes   Pain Score 8    Pain Location Back   Pain Orientation Lower                         [facility] Adult PT Treatment/Exercise - 09/26/16 0001      Exercises   Exercises Lumbar     Lumbar Exercises: Stretches   Passive Hamstring Stretch 4 reps;20 seconds   Piriformis Stretch 4 reps;20 seconds     Lumbar Exercises: Aerobic   Tread Mill NuStep Level 4 x 5 minutes     Lumbar Exercises: Machines for Strengthening   Cybex Knee Extension 5# 2x10   Cybex Knee Flexion 25# 2x10     Lumbar Exercises: Standing   Scapular Retraction 20 reps;Theraband   Theraband Level (Scapular Retraction) Level 2 (Red)   Shoulder Extension 20 reps;Theraband   Theraband Level (Shoulder Extension) Level  2 (Red)   Other Standing Lumbar Exercises trunk rotation with red tband     Lumbar Exercises: Supine   Other Supine Lumbar Exercises feet on ball K2C, trunk rotation, bridges and isometric abdominals     Moist Heat Therapy   Number Minutes Moist Heat 15 Minutes   Moist Heat Location Lumbar Spine     Electrical Stimulation   Electrical Stimulation Location lumbar spine   Electrical Stimulation Action IFC   Electrical Stimulation Parameters supine   Electrical Stimulation Goals Pain     Manual Therapy   Manual Therapy Soft tissue mobilization   Soft tissue mobilization rolling of the quads and ITB area in supine                  PT Short Term Goals - 09/20/16 0925      PT SHORT TERM GOAL #1   Title independent with initial HEP   Time 2   Period Weeks   Status New           PT Long Term Goals - 09/20/16 7096      PT LONG TERM GOAL #1   Title decrease pain in the back 25%   Time 8    Period Weeks   Status New     PT LONG TERM GOAL #2   Title decrease left knee pain 25%   Time 8   Period Weeks   Status New     PT LONG TERM GOAL #3   Title report able to lift grandchild without issue   Time 8   Period Weeks   Status New     PT LONG TERM GOAL #4   Title understand proper posture and body mechanics   Time 8   Period Weeks   Status New     PT LONG TERM GOAL #5   Title independent with an advanced HEP or return to gym or pool program   Time 8   Period Weeks   Status New               Plan - 09/26/16 1115    Clinical Impression Statement Patient tolerated exrecises well, with standing she did have some increase of knee pain.  Had good activation of the abdominals with exercises and cues.   PT Next Visit Plan see how the gym exercises did and adjust accordingly   Consulted and Agree with Plan of Care Patient      Patient will benefit from skilled therapeutic intervention in order to improve the following deficits and impairments:  Abnormal gait, Cardiopulmonary status limiting activity, Decreased activity tolerance, Decreased mobility, Decreased strength, Pain, Decreased range of motion, Difficulty walking, Increased muscle spasms  Visit Diagnosis: Chronic bilateral low back pain with right-sided sciatica  Muscle spasm of back  Chronic pain of left knee  Chronic pain of right knee  Difficulty in walking, not elsewhere classified     Problem List Patient Active Problem List   Diagnosis Date Noted  . Melanoma ([place]) 11/19/2015  . SVT (supraventricular tachycardia) ([place]) 05/20/2015  . Chronic venous insufficiency 10/10/2013  . Neuropathy 04/23/2013  . Psoriasis 04/23/2013  . Deformity of joint 04/23/2013  . 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 Peripheral cyanosis 09/28/2012  . Arthritis of knee, degenerative 09/02/2012  . Routine health maintenance 08/17/2011  . Anemia, iron deficiency 01/22/2011  . Chronic pain 09/22/2010   . Depression with anxiety 09/21/2010  . Spinal stenosis 08/14/2010  . Hypertension 07/08/2010  . Migraine 07/08/2010  .  Knee internal derangement 07/08/2010  . Non-cardiac chest pain 07/08/2010    [name]., PT 09/26/2016, 11:17 AM  [name], [place], [address] Phone: [phone]   Fax:  [phone]  Name: [name] MRN: [id number] Date of Birth: 04/14/1950

## 2016-09-27 ENCOUNTER — Ambulatory Visit: Payer: Medicare Other | Admitting: Physical Therapy

## 2016-09-27 ENCOUNTER — Encounter: Payer: Medicare Other | Admitting: Physical Therapy

## 2016-09-27 ENCOUNTER — Encounter: Payer: Self-pay | Admitting: Physical Therapy

## 2016-09-27 DIAGNOSIS — M25562 Pain in left knee: Secondary | ICD-10-CM | POA: Diagnosis not present

## 2016-09-27 DIAGNOSIS — M5441 Lumbago with sciatica, right side: Secondary | ICD-10-CM | POA: Diagnosis not present

## 2016-09-27 DIAGNOSIS — G8929 Other chronic pain: Secondary | ICD-10-CM | POA: Diagnosis not present

## 2016-09-27 DIAGNOSIS — M62830 Muscle spasm of back: Secondary | ICD-10-CM

## 2016-09-27 DIAGNOSIS — R262 Difficulty in walking, not elsewhere classified: Secondary | ICD-10-CM | POA: Diagnosis not present

## 2016-09-27 DIAGNOSIS — M25561 Pain in right knee: Secondary | ICD-10-CM | POA: Diagnosis not present

## 2016-09-27 NOTE — Therapy (Signed)
[place], [place], [address] Phone: [phone]   Fax:  [phone]  Physical Therapy Treatment  Patient Details  Name: [name] MRN: [id number] Date of Birth: 1950-05-08 Referring Provider: Noemi Chapel  Encounter Date: 09/27/2016      PT End of Session - 09/27/16 1603    Visit Number 3   Date for PT Re-Evaluation 11/21/16   PT Start Time 1510   PT Stop Time 1610   PT Time Calculation (min) 60 min   Activity Tolerance Patient limited by pain   Behavior During Therapy [facility] for tasks assessed/performed      Past Medical History:  Diagnosis Date  . Allergy   . Arthritis   . Chronic kidney disease   . Collagen vascular disease ([place])   . Deformity of joint 04/23/2013  . Derangement of knee, left 2009   started with fall at work. Has had repair patella and torn meniscus  . High cholesterol   . Hx of appendectomy   . Hypertension   . Melanoma ([place]) 11/19/2015  . Migraine headache    hormonal related - less frequent off hormone replacement  . Migraines   . Neuropathy 04/23/2013  . Psoriasis 04/23/2013  . Raynaud disease   . SVT (supraventricular tachycardia) ([place])   . Synovial cyst of lumbar facet joint    lower back - s/p surgery     Past Surgical History:  Procedure Laterality Date  . APPENDECTOMY  1971  . CERVICAL DISCECTOMY  1999   diskectomy with fixation:plate and screws  . KNEE ARTHROSCOPY W/ MENISCAL REPAIR  09   left knee  . L4-5 lumbar fusion  September 02, 2010   Dr. Hal Neer:  minimally invasive transforaminal interbody fusion with spacer  . OVARIAN CYST SURGERY  1972  . PATELLAR REEFING  '09   repair of fractured patella after fall  . SPINE SURGERY    . SYNOVIAL CYST EXCISION  2007   lumbar spine  . VENOUS ABLATION     for pain in the groin - VVTS did procedure. Has some residual discomfort at the  ablation site.     There were no vitals filed for this visit.       Subjective Assessment - 09/27/16 1508    Subjective PAtient reports a fall this AM, she reports she caught her foot on the door and fall into the wall and slid down to the floor.  She reports that she is stiff and sore in the back and in the knees   Currently in Pain? Yes   Pain Score 9    Pain Location Back  both knees                         [facility] Adult PT Treatment/Exercise - 09/27/16 0001      Lumbar Exercises: Stretches   Passive Hamstring Stretch 4 reps;20 seconds   Piriformis Stretch 4 reps;20 seconds     Lumbar Exercises: Aerobic   Tread Mill NuStep Level 4 x 6 minutes     Lumbar Exercises: Standing   Scapular Retraction 20 reps;Theraband   Theraband Level (Scapular Retraction) Level 2 (Red)   Shoulder Extension 20 reps;Theraband   Theraband Level (Shoulder Extension) Level 2 (Red)   Other Standing Lumbar Exercises trunk rotation with red tband     Lumbar Exercises: Supine   Straight Leg Raise 2 seconds;15 reps   Other Supine Lumbar  Exercises feet on ball K2C, trunk rotation, bridges and isometric abdominals     Modalities   Modalities Cryotherapy     Moist Heat Therapy   Number Minutes Moist Heat 15 Minutes   Moist Heat Location Lumbar Spine     Cryotherapy   Number Minutes Cryotherapy 15 Minutes   Cryotherapy Location Knee   Type of Cryotherapy Ice pack     Electrical Stimulation   Electrical Stimulation Location lumbar spine   Electrical Stimulation Action IFC   Electrical Stimulation Parameters supine   Electrical Stimulation Goals Pain                  PT Short Term Goals - 09/20/16 0925      PT SHORT TERM GOAL #1   Title independent with initial HEP   Time 2   Period Weeks   Status New           PT Long Term Goals - 09/20/16 4562      PT LONG TERM GOAL #1   Title decrease pain in the back 25%   Time 8   Period Weeks   Status New     PT LONG TERM GOAL #2   Title decrease left knee pain 25%   Time 8    Period Weeks   Status New     PT LONG TERM GOAL #3   Title report able to lift grandchild without issue   Time 8   Period Weeks   Status New     PT LONG TERM GOAL #4   Title understand proper posture and body mechanics   Time 8   Period Weeks   Status New     PT LONG TERM GOAL #5   Title independent with an advanced HEP or return to gym or pool program   Time 8   Period Weeks   Status New               Plan - 09/27/16 1647    Clinical Impression Statement Patient had a fall and is increased pain today.  She did tolerate all of the exercises wihtout much issue but I did more in supine and sitting and less weight and less weightbearing   PT Next Visit Plan continue to adjust   Consulted and Agree with Plan of Care Patient      Patient will benefit from skilled therapeutic intervention in order to improve the following deficits and impairments:  Abnormal gait, Cardiopulmonary status limiting activity, Decreased activity tolerance, Decreased mobility, Decreased strength, Pain, Decreased range of motion, Difficulty walking, Increased muscle spasms  Visit Diagnosis: Chronic bilateral low back pain with right-sided sciatica  Muscle spasm of back  Chronic pain of left knee  Chronic pain of right knee  Difficulty in walking, not elsewhere classified     Problem List Patient Active Problem List   Diagnosis Date Noted  . Melanoma ([place]) 11/19/2015  . SVT (supraventricular tachycardia) ([place]) 05/20/2015  . Chronic venous insufficiency 10/10/2013  . Neuropathy 04/23/2013  . Psoriasis 04/23/2013  . Deformity of joint 04/23/2013  . 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 Peripheral cyanosis 09/28/2012  . Arthritis of knee, degenerative 09/02/2012  . Routine health maintenance 08/17/2011  . Anemia, iron deficiency 01/22/2011  . Chronic pain 09/22/2010  . Depression with anxiety 09/21/2010  . Spinal stenosis 08/14/2010  . Hypertension 07/08/2010  . Migraine  07/08/2010  . Knee internal derangement 07/08/2010  . Non-cardiac chest pain 07/08/2010    Kartik Fernando W.,  PT 09/27/2016, 4:50 PM  [place], [place], [address] Phone: [phone]   Fax:  [phone]  Name: [name] MRN: [id number] Date of Birth: [birth year]

## 2016-09-28 DIAGNOSIS — M170 Bilateral primary osteoarthritis of knee: Secondary | ICD-10-CM | POA: Diagnosis not present

## 2016-10-03 ENCOUNTER — Ambulatory Visit: Payer: Medicare Other | Admitting: Physical Therapy

## 2016-10-03 ENCOUNTER — Encounter: Payer: Self-pay | Admitting: Physical Therapy

## 2016-10-03 DIAGNOSIS — M25562 Pain in left knee: Secondary | ICD-10-CM

## 2016-10-03 DIAGNOSIS — G8929 Other chronic pain: Secondary | ICD-10-CM | POA: Diagnosis not present

## 2016-10-03 DIAGNOSIS — M5441 Lumbago with sciatica, right side: Principal | ICD-10-CM

## 2016-10-03 DIAGNOSIS — M25561 Pain in right knee: Secondary | ICD-10-CM

## 2016-10-03 DIAGNOSIS — R262 Difficulty in walking, not elsewhere classified: Secondary | ICD-10-CM | POA: Diagnosis not present

## 2016-10-03 DIAGNOSIS — M62830 Muscle spasm of back: Secondary | ICD-10-CM | POA: Diagnosis not present

## 2016-10-03 NOTE — Therapy (Signed)
[name], [place], [address] Phone: [phone]   Fax:  [phone]  Physical Therapy Treatment  Patient Details  Name: [name] MRN: [id number] Date of Birth: 1951-01-14 Referring Provider: Noemi Chapel  Encounter Date: 10/03/2016      PT End of Session - 10/03/16 1148    Visit Number 4   Date for PT Re-Evaluation 11/21/16   PT Start Time 1057   PT Stop Time 1157   PT Time Calculation (min) 60 min   Activity Tolerance Patient tolerated treatment well   Behavior During Therapy [facility] for tasks assessed/performed      Past Medical History:  Diagnosis Date  . Allergy   . Arthritis   . Chronic kidney disease   . Collagen vascular disease ([place])   . Deformity of joint 04/23/2013  . Derangement of knee, left 2009   started with fall at work. Has had repair patella and torn meniscus  . High cholesterol   . Hx of appendectomy   . Hypertension   . Melanoma ([place]) 11/19/2015  . Migraine headache    hormonal related - less frequent off hormone replacement  . Migraines   . Neuropathy 04/23/2013  . Psoriasis 04/23/2013  . Raynaud disease   . SVT (supraventricular tachycardia) ([place])   . Synovial cyst of lumbar facet joint    lower back - s/p surgery     Past Surgical History:  Procedure Laterality Date  . APPENDECTOMY  1971  . CERVICAL DISCECTOMY  1999   diskectomy with fixation:plate and screws  . KNEE ARTHROSCOPY W/ MENISCAL REPAIR  09   left knee  . L4-5 lumbar fusion  September 02, 2010   Dr. Hal Neer:  minimally invasive transforaminal interbody fusion with spacer  . OVARIAN CYST SURGERY  1972  . PATELLAR REEFING  '09   repair of fractured patella after fall  . SPINE SURGERY    . SYNOVIAL CYST EXCISION  2007   lumbar spine  . VENOUS ABLATION     for pain in the groin - VVTS did procedure. Has some residual discomfort at the  ablation site.     There were no vitals filed for this  visit.                       [facility] Adult PT Treatment/Exercise - 10/03/16 0001      High Level Balance   High Level Balance Comments resisted gait all directions, high knee marching     Lumbar Exercises: Stretches   Passive Hamstring Stretch 4 reps;20 seconds   Piriformis Stretch 4 reps;20 seconds     Lumbar Exercises: Aerobic   Tread Mill NuStep Level 5 x 6 minutes   UBE (Upper Arm Bike) level 6 x 5 minutes     Lumbar Exercises: Machines for Strengthening   Cybex Knee Extension 5# 2x10   Cybex Knee Flexion 25# 2x10   Other Lumbar Machine Exercise straight arm pull downs 25#   Other Lumbar Machine Exercise seated row 15#, lats 20# 2x10     Lumbar Exercises: Supine   Other Supine Lumbar Exercises feet on ball K2C, trunk rotation, bridges and isometric abdominals     Moist Heat Therapy   Number Minutes Moist Heat 15 Minutes   Moist Heat Location Lumbar Spine     Electrical Stimulation   Electrical Stimulation Location lumbar spine   Electrical Stimulation Action IFC   Electrical Stimulation Parameters  supine   Electrical Stimulation Goals Pain                  PT Short Term Goals - 09/20/16 0925      PT SHORT TERM GOAL #1   Title independent with initial HEP   Time 2   Period Weeks   Status New           PT Long Term Goals - 09/20/16 1610      PT LONG TERM GOAL #1   Title decrease pain in the back 25%   Time 8   Period Weeks   Status New     PT LONG TERM GOAL #2   Title decrease left knee pain 25%   Time 8   Period Weeks   Status New     PT LONG TERM GOAL #3   Title report able to lift grandchild without issue   Time 8   Period Weeks   Status New     PT LONG TERM GOAL #4   Title understand proper posture and body mechanics   Time 8   Period Weeks   Status New     PT LONG TERM GOAL #5   Title independent with an advanced HEP or return to gym or pool program   Time 8   Period Weeks   Status New                Plan - 10/03/16 1149    Clinical Impression Statement Patient really did well with all the exercises, she does have great difficulty lifting the legs to get in and out of the car.   PT Next Visit Plan continue to adjust   Consulted and Agree with Plan of Care Patient      Patient will benefit from skilled therapeutic intervention in order to improve the following deficits and impairments:  Abnormal gait, Cardiopulmonary status limiting activity, Decreased activity tolerance, Decreased mobility, Decreased strength, Pain, Decreased range of motion, Difficulty walking, Increased muscle spasms  Visit Diagnosis: Chronic bilateral low back pain with right-sided sciatica  Muscle spasm of back  Chronic pain of left knee  Chronic pain of right knee  Difficulty in walking, not elsewhere classified     Problem List Patient Active Problem List   Diagnosis Date Noted  . Melanoma ([place]) 11/19/2015  . SVT (supraventricular tachycardia) ([place]) 05/20/2015  . Chronic venous insufficiency 10/10/2013  . Neuropathy 04/23/2013  . Psoriasis 04/23/2013  . Deformity of joint 04/23/2013  . 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 Peripheral cyanosis 09/28/2012  . Arthritis of knee, degenerative 09/02/2012  . Routine health maintenance 08/17/2011  . Anemia, iron deficiency 01/22/2011  . Chronic pain 09/22/2010  . Depression with anxiety 09/21/2010  . Spinal stenosis 08/14/2010  . Hypertension 07/08/2010  . Migraine 07/08/2010  . Knee internal derangement 07/08/2010  . Non-cardiac chest pain 07/08/2010    Taylor Boast., PT 10/03/2016, 11:50 AM  [place], [place], [address] Phone: [phone]   Fax:  [phone]  Name: [name] MRN: [id number] Date of Birth: May 12, 1950

## 2016-10-05 ENCOUNTER — Ambulatory Visit: Payer: Medicare Other | Admitting: Physical Therapy

## 2016-10-05 ENCOUNTER — Encounter: Payer: Self-pay | Admitting: Physical Therapy

## 2016-10-05 DIAGNOSIS — M25561 Pain in right knee: Secondary | ICD-10-CM

## 2016-10-05 DIAGNOSIS — G8929 Other chronic pain: Secondary | ICD-10-CM

## 2016-10-05 DIAGNOSIS — R262 Difficulty in walking, not elsewhere classified: Secondary | ICD-10-CM

## 2016-10-05 DIAGNOSIS — M62830 Muscle spasm of back: Secondary | ICD-10-CM | POA: Diagnosis not present

## 2016-10-05 DIAGNOSIS — M25562 Pain in left knee: Secondary | ICD-10-CM | POA: Diagnosis not present

## 2016-10-05 DIAGNOSIS — M5441 Lumbago with sciatica, right side: Secondary | ICD-10-CM | POA: Diagnosis not present

## 2016-10-05 NOTE — Therapy (Signed)
[place] [address], [place], [address] Phone: [phone]   Fax:  [phone]  Physical Therapy Treatment  Patient Details  Name: [name] MRN: [id number] Date of Birth: 05-24-1950 Referring Provider: Noemi Chapel  Encounter Date: 10/05/2016      PT End of Session - 10/05/16 1446    Visit Number 5   Date for PT Re-Evaluation 11/21/16   PT Start Time 1400   PT Stop Time 1501   PT Time Calculation (min) 61 min   Activity Tolerance Patient tolerated treatment well   Behavior During Therapy [facility] for tasks assessed/performed      Past Medical History:  Diagnosis Date  . Allergy   . Arthritis   . Chronic kidney disease   . Collagen vascular disease ([place])   . Deformity of joint 04/23/2013  . Derangement of knee, left 2009   started with fall at work. Has had repair patella and torn meniscus  . High cholesterol   . Hx of appendectomy   . Hypertension   . Melanoma ([place]) 11/19/2015  . Migraine headache    hormonal related - less frequent off hormone replacement  . Migraines   . Neuropathy 04/23/2013  . Psoriasis 04/23/2013  . Raynaud disease   . SVT (supraventricular tachycardia) ([place])   . Synovial cyst of lumbar facet joint    lower back - s/p surgery     Past Surgical History:  Procedure Laterality Date  . APPENDECTOMY  1971  . CERVICAL DISCECTOMY  1999   diskectomy with fixation:plate and screws  . KNEE ARTHROSCOPY W/ MENISCAL REPAIR  09   left knee  . L4-5 lumbar fusion  September 02, 2010   Dr. Hal Neer:  minimally invasive transforaminal interbody fusion with spacer  . OVARIAN CYST SURGERY  1972  . PATELLAR REEFING  '09   repair of fractured patella after fall  . SPINE SURGERY    . SYNOVIAL CYST EXCISION  2007   lumbar spine  . VENOUS ABLATION     for pain in the groin - VVTS did procedure. Has some residual discomfort at the  ablation site.     There were no vitals filed for this  visit.      Subjective Assessment - 10/05/16 1401    Subjective Patient reports that she is pretty sore, reports that she tried to do some yardwork yesterday and ended up doing about 3 hours in the yard.   Currently in Pain? Yes   Pain Score 7    Pain Location Back   Pain Orientation Lower   Aggravating Factors  yardwork   Pain Relieving Factors this treatment seems to help some                         [facility] Adult PT Treatment/Exercise - 10/05/16 0001      Lumbar Exercises: Aerobic   Tread Mill NuStep Level 5 x 6 minutes   UBE (Upper Arm Bike) level 6 x 6 minutes     Lumbar Exercises: Machines for Strengthening   Cybex Knee Extension 5# 2x10   Cybex Knee Flexion 25# 2x10   Leg Press 30# 2x10   Other Lumbar Machine Exercise straight arm pull downs 25#   Other Lumbar Machine Exercise seated row 15#, lats 20# 2x10, 25# triceps, 5# biceps     Lumbar Exercises: Standing   Other Standing Lumbar Exercises hip extension, ball over head lift to  engage the core     Moist Heat Therapy   Number Minutes Moist Heat 15 Minutes   Moist Heat Location Lumbar Spine     Electrical Stimulation   Electrical Stimulation Location lumbar spine   Electrical Stimulation Action IFC   Electrical Stimulation Parameters supine   Electrical Stimulation Goals Pain     Manual Therapy   Manual Therapy Soft tissue mobilization   Soft tissue mobilization rolling of the quads and ITB area in supine                  PT Short Term Goals - 10/05/16 1447      PT SHORT TERM GOAL #1   Title independent with initial HEP   Status Achieved           PT Long Term Goals - 09/20/16 0925      PT LONG TERM GOAL #1   Title decrease pain in the back 25%   Time 8   Period Weeks   Status New     PT LONG TERM GOAL #2   Title decrease left knee pain 25%   Time 8   Period Weeks   Status New     PT LONG TERM GOAL #3   Title report able to lift grandchild without issue   Time 8    Period Weeks   Status New     PT LONG TERM GOAL #4   Title understand proper posture and body mechanics   Time 8   Period Weeks   Status New     PT LONG TERM GOAL #5   Title independent with an advanced HEP or return to gym or pool program   Time 8   Period Weeks   Status New               Plan - 10/05/16 1446    Clinical Impression Statement Patient has a high rating of pain but gives good effort and tries all of the exercises.  She seems to have decreased core strength.     PT Next Visit Plan work on core   Consulted and Agree with Plan of Care Patient      Patient will benefit from skilled therapeutic intervention in order to improve the following deficits and impairments:  Abnormal gait, Cardiopulmonary status limiting activity, Decreased activity tolerance, Decreased mobility, Decreased strength, Pain, Decreased range of motion, Difficulty walking, Increased muscle spasms  Visit Diagnosis: Chronic bilateral low back pain with right-sided sciatica  Muscle spasm of back  Chronic pain of left knee  Chronic pain of right knee  Difficulty in walking, not elsewhere classified     Problem List Patient Active Problem List   Diagnosis Date Noted  . Melanoma ([place]) 11/19/2015  . SVT (supraventricular tachycardia) ([place]) 05/20/2015  . Chronic venous insufficiency 10/10/2013  . Neuropathy 04/23/2013  . Psoriasis 04/23/2013  . Deformity of joint 04/23/2013  . 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 Peripheral cyanosis 09/28/2012  . Arthritis of knee, degenerative 09/02/2012  . Routine health maintenance 08/17/2011  . Anemia, iron deficiency 01/22/2011  . Chronic pain 09/22/2010  . Depression with anxiety 09/21/2010  . Spinal stenosis 08/14/2010  . Hypertension 07/08/2010  . Migraine 07/08/2010  . Knee internal derangement 07/08/2010  . Non-cardiac chest pain 07/08/2010    Sumner Boast., PT 10/05/2016, 2:48 PM  [place] [address] [place], [place], [address] Phone: [phone]   Fax:  [phone]  Name: Leveta Wahab MRN: [id number] Date of Birth: 05-08-1950

## 2016-10-10 ENCOUNTER — Ambulatory Visit: Payer: Medicare Other | Admitting: Physical Therapy

## 2016-10-12 ENCOUNTER — Ambulatory Visit: Payer: Medicare Other | Attending: Orthopedic Surgery | Admitting: Physical Therapy

## 2016-10-12 ENCOUNTER — Encounter: Payer: Self-pay | Admitting: Physical Therapy

## 2016-10-12 DIAGNOSIS — M5441 Lumbago with sciatica, right side: Secondary | ICD-10-CM | POA: Insufficient documentation

## 2016-10-12 DIAGNOSIS — M25562 Pain in left knee: Secondary | ICD-10-CM | POA: Insufficient documentation

## 2016-10-12 DIAGNOSIS — R262 Difficulty in walking, not elsewhere classified: Secondary | ICD-10-CM | POA: Insufficient documentation

## 2016-10-12 DIAGNOSIS — M62830 Muscle spasm of back: Secondary | ICD-10-CM | POA: Diagnosis not present

## 2016-10-12 DIAGNOSIS — M25561 Pain in right knee: Secondary | ICD-10-CM | POA: Insufficient documentation

## 2016-10-12 DIAGNOSIS — G8929 Other chronic pain: Secondary | ICD-10-CM | POA: Insufficient documentation

## 2016-10-12 NOTE — Therapy (Signed)
[place], [place], [address] Phone: [phone]   Fax:  [phone]  Physical Therapy Treatment  Patient Details  Name: [name] MRN: [id number] Date of Birth: 07-16-1950 Referring Provider: Noemi Chapel  Encounter Date: 10/12/2016      PT End of Session - 10/12/16 1445    Visit Number 6   Date for PT Re-Evaluation 11/21/16   PT Start Time 1352   PT Stop Time 1455   PT Time Calculation (min) 63 min   Activity Tolerance Patient tolerated treatment well   Behavior During Therapy [facility] for tasks assessed/performed      Past Medical History:  Diagnosis Date  . Allergy   . Arthritis   . Chronic kidney disease   . Collagen vascular disease ([place])   . Deformity of joint 04/23/2013  . Derangement of knee, left 2009   started with fall at work. Has had repair patella and torn meniscus  . High cholesterol   . Hx of appendectomy   . Hypertension   . Melanoma ([place]) 11/19/2015  . Migraine headache    hormonal related - less frequent off hormone replacement  . Migraines   . Neuropathy 04/23/2013  . Psoriasis 04/23/2013  . Raynaud disease   . SVT (supraventricular tachycardia) ([place])   . Synovial cyst of lumbar facet joint    lower back - s/p surgery     Past Surgical History:  Procedure Laterality Date  . APPENDECTOMY  1971  . CERVICAL DISCECTOMY  1999   diskectomy with fixation:plate and screws  . KNEE ARTHROSCOPY W/ MENISCAL REPAIR  09   left knee  . L4-5 lumbar fusion  September 02, 2010   Dr. Hal Neer:  minimally invasive transforaminal interbody fusion with spacer  . OVARIAN CYST SURGERY  1972  . PATELLAR REEFING  '09   repair of fractured patella after fall  . SPINE SURGERY    . SYNOVIAL CYST EXCISION  2007   lumbar spine  . VENOUS ABLATION     for pain in the groin - VVTS did procedure. Has some residual discomfort at the  ablation site.     There were no vitals filed for this  visit.      Subjective Assessment - 10/12/16 1404    Subjective Reports that she was hurting pretty bad over the weekend, tried diclofenac and it upset her stomach, reports that she is now on meloxicam.  Reports that her back, hips and left knee are hurting today   Currently in Pain? Yes   Pain Score 7    Pain Location Back  hips and left knee   Aggravating Factors  activity   Pain Relieving Factors rest                         [facility] Adult PT Treatment/Exercise - 10/12/16 0001      Lumbar Exercises: Aerobic   Elliptical R=5, I=6 x 3 minutes   Tread Mill NuStep Level 4 x 6 minutes     Lumbar Exercises: Machines for Strengthening   Cybex Knee Extension 5# 2x10   Cybex Knee Flexion 25# 2x10   Leg Press 30# 2x10   Other Lumbar Machine Exercise straight arm pull downs 25#   Other Lumbar Machine Exercise seated row 15#, lats 20# 2x10, 25# triceps, 5# biceps     Lumbar Exercises: Standing   Other Standing Lumbar Exercises hip extension, ball over head  lift to engage the core   Other Standing Lumbar Exercises tried hip abduction, had pain in the right gluteal area     Lumbar Exercises: Supine   Other Supine Lumbar Exercises feet on ball K2C, trunk rotation, bridges and isometric abdominals     Moist Heat Therapy   Number Minutes Moist Heat 15 Minutes   Moist Heat Location Lumbar Spine     Electrical Stimulation   Electrical Stimulation Location lumbar spine   Electrical Stimulation Action IFC   Electrical Stimulation Parameters supien   Electrical Stimulation Goals Pain                  PT Short Term Goals - 10/05/16 1447      PT SHORT TERM GOAL #1   Title independent with initial HEP   Status Achieved           PT Long Term Goals - 10/12/16 1447      PT LONG TERM GOAL #1   Title decrease pain in the back 25%   Status On-going     PT LONG TERM GOAL #2   Title decrease left knee pain 25%   Status On-going               Plan  - 10/12/16 1446    Clinical Impression Statement Patient reports some increased soreness after the last session but reports that she feels like that is what she needs.  She had pain with right hip abduction, she is very tender in the right gluteal medius area   PT Next Visit Plan may look at the gluteals on what we can do to decrease the sorenss   Consulted and Agree with Plan of Care Patient      Patient will benefit from skilled therapeutic intervention in order to improve the following deficits and impairments:  Abnormal gait, Cardiopulmonary status limiting activity, Decreased activity tolerance, Decreased mobility, Decreased strength, Pain, Decreased range of motion, Difficulty walking, Increased muscle spasms  Visit Diagnosis: Chronic bilateral low back pain with right-sided sciatica  Muscle spasm of back  Chronic pain of left knee  Difficulty in walking, not elsewhere classified  Chronic pain of right knee     Problem List Patient Active Problem List   Diagnosis Date Noted  . Melanoma ([place]) 11/19/2015  . SVT (supraventricular tachycardia) ([place]) 05/20/2015  . Chronic venous insufficiency 10/10/2013  . Neuropathy 04/23/2013  . Psoriasis 04/23/2013  . Deformity of joint 04/23/2013  . 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 Peripheral cyanosis 09/28/2012  . Arthritis of knee, degenerative 09/02/2012  . Routine health maintenance 08/17/2011  . Anemia, iron deficiency 01/22/2011  . Chronic pain 09/22/2010  . Depression with anxiety 09/21/2010  . Spinal stenosis 08/14/2010  . Hypertension 07/08/2010  . Migraine 07/08/2010  . Knee internal derangement 07/08/2010  . Non-cardiac chest pain 07/08/2010    Sumner Boast., PT 10/12/2016, 2:48 PM  [place], [place], [address] Phone: [phone]   Fax:  [phone]  Name: [name] MRN: [id number] Date of Birth:  December 02, 1950

## 2016-10-17 ENCOUNTER — Encounter: Payer: Self-pay | Admitting: Physical Therapy

## 2016-10-17 ENCOUNTER — Ambulatory Visit: Payer: Medicare Other | Admitting: Physical Therapy

## 2016-10-17 DIAGNOSIS — G8929 Other chronic pain: Secondary | ICD-10-CM

## 2016-10-17 DIAGNOSIS — R262 Difficulty in walking, not elsewhere classified: Secondary | ICD-10-CM

## 2016-10-17 DIAGNOSIS — M25561 Pain in right knee: Secondary | ICD-10-CM

## 2016-10-17 DIAGNOSIS — M62830 Muscle spasm of back: Secondary | ICD-10-CM | POA: Diagnosis not present

## 2016-10-17 DIAGNOSIS — M25562 Pain in left knee: Secondary | ICD-10-CM

## 2016-10-17 DIAGNOSIS — M5441 Lumbago with sciatica, right side: Principal | ICD-10-CM

## 2016-10-17 NOTE — Therapy (Signed)
[place], [place], [address] Phone: [phone]   Fax:  [phone]  Physical Therapy Treatment  Patient Details  Name: [name] MRN: [id number] Date of Birth: 10-30-1950 Referring Provider: Noemi Chapel  Encounter Date: 10/17/2016    Past Medical History:  Diagnosis Date  . Allergy   . Arthritis   . Chronic kidney disease   . Collagen vascular disease ([place])   . Deformity of joint 04/23/2013  . Derangement of knee, left 2009   started with fall at work. Has had repair patella and torn meniscus  . High cholesterol   . Hx of appendectomy   . Hypertension   . Melanoma ([place]) 11/19/2015  . Migraine headache    hormonal related - less frequent off hormone replacement  . Migraines   . Neuropathy 04/23/2013  . Psoriasis 04/23/2013  . Raynaud disease   . SVT (supraventricular tachycardia) ([place])   . Synovial cyst of lumbar facet joint    lower back - s/p surgery     Past Surgical History:  Procedure Laterality Date  . APPENDECTOMY  1971  . CERVICAL DISCECTOMY  1999   diskectomy with fixation:plate and screws  . KNEE ARTHROSCOPY W/ MENISCAL REPAIR  09   left knee  . L4-5 lumbar fusion  September 02, 2010   Dr. Hal Neer:  minimally invasive transforaminal interbody fusion with spacer  . OVARIAN CYST SURGERY  1972  . PATELLAR REEFING  '09   repair of fractured patella after fall  . SPINE SURGERY    . SYNOVIAL CYST EXCISION  2007   lumbar spine  . VENOUS ABLATION     for pain in the groin - VVTS did procedure. Has some residual discomfort at the  ablation site.     There were no vitals filed for this visit.      Subjective Assessment - 10/17/16 1344    Subjective Reports that she is feeling better today compared to last week, still hurting in the hips some   Currently in Pain? Yes   Pain Score 5    Pain Location Back  and into hips                         [facility]  Adult PT Treatment/Exercise - 10/17/16 0001      Lumbar Exercises: Stretches   Passive Hamstring Stretch 4 reps;20 seconds   Piriformis Stretch 4 reps;20 seconds     Lumbar Exercises: Aerobic   Elliptical R=5, I=6 x 4 minutes   Tread Mill NuStep Level 4 x 6 minutes     Lumbar Exercises: Machines for Strengthening   Leg Press 40# 2x10   Other Lumbar Machine Exercise straight arm pull downs 25#     Lumbar Exercises: Supine   Clam 20 reps   Clam Limitations green tband with bridge   Bridge 5 seconds;20 reps   Other Supine Lumbar Exercises feet on ball K2C, trunk rotation, bridges and isometric abdominals     Lumbar Exercises: Sidelying   Clam 20 reps   Hip Abduction 10 reps     Modalities   Modalities Iontophoresis     Moist Heat Therapy   Number Minutes Moist Heat 15 Minutes   Moist Heat Location Lumbar Spine     Electrical Stimulation   Electrical Stimulation Location lumbar spine   Electrical Stimulation Action IFC   Electrical Stimulation Parameters supine   Electrical Stimulation Goals Pain  Iontophoresis   Type of Iontophoresis Dexamethasone   Location right GT area   Dose 70mA   Time 4 hour patch #1     Manual Therapy   Manual Therapy Soft tissue mobilization   Soft tissue mobilization rolling of the quads and right ITB area in supine                  PT Short Term Goals - 10/05/16 1447      PT SHORT TERM GOAL #1   Title independent with initial HEP   Status Achieved           PT Long Term Goals - 10/12/16 1447      PT LONG TERM GOAL #1   Title decrease pain in the back 25%   Status On-going     PT LONG TERM GOAL #2   Title decrease left knee pain 25%   Status On-going               Plan - 10/17/16 1430    Clinical Impression Statement Patient is very tight in th eHS, the piriformis and the ITB, she is very tneder in the right GT area and the left.  Glute med is very sore   PT Next Visit Plan see if ionto helps    Consulted and Agree with Plan of Care Patient      Patient will benefit from skilled therapeutic intervention in order to improve the following deficits and impairments:  Abnormal gait, Cardiopulmonary status limiting activity, Decreased activity tolerance, Decreased mobility, Decreased strength, Pain, Decreased range of motion, Difficulty walking, Increased muscle spasms  Visit Diagnosis: Chronic bilateral low back pain with right-sided sciatica  Muscle spasm of back  Chronic pain of left knee  Difficulty in walking, not elsewhere classified  Chronic pain of right knee     Problem List Patient Active Problem List   Diagnosis Date Noted  . Melanoma ([place]) 11/19/2015  . SVT (supraventricular tachycardia) ([place]) 05/20/2015  . Chronic venous insufficiency 10/10/2013  . Neuropathy 04/23/2013  . Psoriasis 04/23/2013  . Deformity of joint 04/23/2013  . 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 Peripheral cyanosis 09/28/2012  . Arthritis of knee, degenerative 09/02/2012  . Routine health maintenance 08/17/2011  . Anemia, iron deficiency 01/22/2011  . Chronic pain 09/22/2010  . Depression with anxiety 09/21/2010  . Spinal stenosis 08/14/2010  . Hypertension 07/08/2010  . Migraine 07/08/2010  . Knee internal derangement 07/08/2010  . Non-cardiac chest pain 07/08/2010    Sumner Boast., PT 10/17/2016, 2:32 PM  [place], [place], [address] Phone: [phone]   Fax:  [phone]  Name: Lamonda Noxon MRN: [id number] Date of Birth: Jun 22, 1950

## 2016-10-19 ENCOUNTER — Encounter (HOSPITAL_COMMUNITY): Payer: Self-pay | Admitting: Emergency Medicine

## 2016-10-19 ENCOUNTER — Encounter: Payer: Self-pay | Admitting: Physical Therapy

## 2016-10-19 ENCOUNTER — Ambulatory Visit: Payer: Medicare Other | Admitting: Physical Therapy

## 2016-10-19 ENCOUNTER — Emergency Department (HOSPITAL_COMMUNITY): Payer: Medicare Other

## 2016-10-19 DIAGNOSIS — R9431 Abnormal electrocardiogram [ECG] [EKG]: Secondary | ICD-10-CM | POA: Diagnosis not present

## 2016-10-19 DIAGNOSIS — M6281 Muscle weakness (generalized): Secondary | ICD-10-CM | POA: Diagnosis not present

## 2016-10-19 DIAGNOSIS — M79606 Pain in leg, unspecified: Secondary | ICD-10-CM

## 2016-10-19 DIAGNOSIS — R112 Nausea with vomiting, unspecified: Secondary | ICD-10-CM | POA: Diagnosis not present

## 2016-10-19 DIAGNOSIS — R197 Diarrhea, unspecified: Secondary | ICD-10-CM | POA: Diagnosis not present

## 2016-10-19 DIAGNOSIS — A419 Sepsis, unspecified organism: Secondary | ICD-10-CM | POA: Diagnosis not present

## 2016-10-19 DIAGNOSIS — Z5321 Procedure and treatment not carried out due to patient leaving prior to being seen by health care provider: Secondary | ICD-10-CM

## 2016-10-19 DIAGNOSIS — K529 Noninfective gastroenteritis and colitis, unspecified: Secondary | ICD-10-CM | POA: Diagnosis not present

## 2016-10-19 DIAGNOSIS — M62830 Muscle spasm of back: Secondary | ICD-10-CM

## 2016-10-19 DIAGNOSIS — M25561 Pain in right knee: Secondary | ICD-10-CM

## 2016-10-19 DIAGNOSIS — E039 Hypothyroidism, unspecified: Secondary | ICD-10-CM | POA: Diagnosis not present

## 2016-10-19 DIAGNOSIS — G894 Chronic pain syndrome: Secondary | ICD-10-CM | POA: Diagnosis not present

## 2016-10-19 DIAGNOSIS — I10 Essential (primary) hypertension: Secondary | ICD-10-CM | POA: Diagnosis not present

## 2016-10-19 DIAGNOSIS — S299XXA Unspecified injury of thorax, initial encounter: Secondary | ICD-10-CM | POA: Diagnosis not present

## 2016-10-19 DIAGNOSIS — R110 Nausea: Secondary | ICD-10-CM

## 2016-10-19 DIAGNOSIS — M5441 Lumbago with sciatica, right side: Principal | ICD-10-CM

## 2016-10-19 DIAGNOSIS — G8929 Other chronic pain: Secondary | ICD-10-CM

## 2016-10-19 DIAGNOSIS — H538 Other visual disturbances: Secondary | ICD-10-CM | POA: Insufficient documentation

## 2016-10-19 DIAGNOSIS — J189 Pneumonia, unspecified organism: Secondary | ICD-10-CM | POA: Diagnosis not present

## 2016-10-19 DIAGNOSIS — R262 Difficulty in walking, not elsewhere classified: Secondary | ICD-10-CM

## 2016-10-19 DIAGNOSIS — R509 Fever, unspecified: Secondary | ICD-10-CM | POA: Diagnosis not present

## 2016-10-19 DIAGNOSIS — R079 Chest pain, unspecified: Secondary | ICD-10-CM | POA: Diagnosis not present

## 2016-10-19 DIAGNOSIS — A020 Salmonella enteritis: Secondary | ICD-10-CM | POA: Diagnosis not present

## 2016-10-19 DIAGNOSIS — R109 Unspecified abdominal pain: Secondary | ICD-10-CM | POA: Diagnosis not present

## 2016-10-19 DIAGNOSIS — M25562 Pain in left knee: Secondary | ICD-10-CM

## 2016-10-19 DIAGNOSIS — R52 Pain, unspecified: Secondary | ICD-10-CM | POA: Diagnosis not present

## 2016-10-19 DIAGNOSIS — F418 Other specified anxiety disorders: Secondary | ICD-10-CM | POA: Diagnosis not present

## 2016-10-19 LAB — COMPREHENSIVE METABOLIC PANEL
ALT: 75 U/L — ABNORMAL HIGH (ref 14–54)
AST: 64 U/L — ABNORMAL HIGH (ref 15–41)
Albumin: 4.3 g/dL (ref 3.5–5.0)
Alkaline Phosphatase: 114 U/L (ref 38–126)
Anion gap: 10 (ref 5–15)
BUN: 12 mg/dL (ref 6–20)
CO2: 25 mmol/L (ref 22–32)
Calcium: 9.4 mg/dL (ref 8.9–10.3)
Chloride: 101 mmol/L (ref 101–111)
Creatinine, Ser: 0.99 mg/dL (ref 0.44–1.00)
GFR calc Af Amer: >60 mL/min (ref >60)
GFR calc non Af Amer: 59 mL/min — ABNORMAL LOW (ref >60)
Glucose, Bld: 112 mg/dL — ABNORMAL HIGH (ref 65–99)
Potassium: 4.4 mmol/L (ref 3.5–5.1)
Sodium: 136 mmol/L (ref 135–145)
Total Bilirubin: 0.9 mg/dL (ref 0.3–1.2)
Total Protein: 7.7 g/dL (ref 6.5–8.1)

## 2016-10-19 LAB — CBC WITH DIFFERENTIAL/PLATELET
Basophils Absolute: 0 10*3/uL (ref 0.0–0.1)
Basophils Relative: 0 %
Eosinophils Absolute: 0.1 10*3/uL (ref 0.0–0.7)
Eosinophils Relative: 1 %
HCT: 43.4 % (ref 36.0–46.0)
Hemoglobin: 14.7 g/dL (ref 12.0–15.0)
Lymphocytes Relative: 10 %
Lymphs Abs: 1.2 10*3/uL (ref 0.7–4.0)
MCH: 29.1 pg (ref 26.0–34.0)
MCHC: 33.9 g/dL (ref 30.0–36.0)
MCV: 85.8 fL (ref 78.0–100.0)
Monocytes Absolute: 0.7 10*3/uL (ref 0.1–1.0)
Monocytes Relative: 6 %
Neutro Abs: 9.9 10*3/uL — ABNORMAL HIGH (ref 1.7–7.7)
Neutrophils Relative %: 83 %
Platelets: 231 10*3/uL (ref 150–400)
RBC: 5.06 MIL/uL (ref 3.87–5.11)
RDW: 14.3 % (ref 11.5–15.5)
WBC: 12 10*3/uL — ABNORMAL HIGH (ref 4.0–10.5)

## 2016-10-19 LAB — URINALYSIS, ROUTINE W REFLEX MICROSCOPIC
Bacteria, UA: NONE SEEN
Bilirubin Urine: NEGATIVE
Glucose, UA: NEGATIVE mg/dL
Ketones, ur: NEGATIVE mg/dL
Leukocytes, UA: NEGATIVE
Nitrite: NEGATIVE
Protein, ur: NEGATIVE mg/dL
Specific Gravity, Urine: 1.013 (ref 1.005–1.030)
pH: 6 (ref 5.0–8.0)

## 2016-10-19 LAB — I-STAT TROPONIN, ED: Troponin i, poc: 0 ng/mL (ref 0.00–0.08)

## 2016-10-19 LAB — I-STAT CG4 LACTIC ACID, ED: Lactic Acid, Venous: 1.76 mmol/L (ref 0.5–1.9)

## 2016-10-19 NOTE — ED Notes (Signed)
Pt presents with multiple complaints. Reports bilateral leg pain, dizziness, nausea, blurred vision, CP since yesterday. Pt went to [facility] and noted to have low grade fever. Pt came here for further eval. Pt poor historian.

## 2016-10-19 NOTE — Therapy (Addendum)
[place], [place], [address] Phone: [phone]   Fax:  [phone]  Physical Therapy Treatment  Patient Details  Name: [name] MRN: [id number] Date of Birth: May 31, 1950 Referring Provider: Noemi Chapel  Encounter Date: 10/19/2016      PT End of Session - 10/19/16 1530    Visit Number 7   Date for PT Re-Evaluation 11/21/16   PT Start Time 1962   PT Stop Time 1540   PT Time Calculation (min) 61 min   Activity Tolerance Patient tolerated treatment well   Behavior During Therapy [facility] for tasks assessed/performed      Past Medical History:  Diagnosis Date  . Allergy   . Arthritis   . Chronic kidney disease   . Collagen vascular disease ([place])   . Deformity of joint 04/23/2013  . Derangement of knee, left 2009   started with fall at work. Has had repair patella and torn meniscus  . High cholesterol   . Hx of appendectomy   . Hypertension   . Melanoma ([place]) 11/19/2015  . Migraine headache    hormonal related - less frequent off hormone replacement  . Migraines   . Neuropathy 04/23/2013  . Psoriasis 04/23/2013  . Raynaud disease   . SVT (supraventricular tachycardia) ([place])   . Synovial cyst of lumbar facet joint    lower back - s/p surgery     Past Surgical History:  Procedure Laterality Date  . APPENDECTOMY  1971  . CERVICAL DISCECTOMY  1999   diskectomy with fixation:plate and screws  . KNEE ARTHROSCOPY W/ MENISCAL REPAIR  09   left knee  . L4-5 lumbar fusion  September 02, 2010   Dr. Hal Neer:  minimally invasive transforaminal interbody fusion with spacer  . OVARIAN CYST SURGERY  1972  . PATELLAR REEFING  '09   repair of fractured patella after fall  . SPINE SURGERY    . SYNOVIAL CYST EXCISION  2007   lumbar spine  . VENOUS ABLATION     for pain in the groin - VVTS did procedure. Has some residual discomfort at the  ablation site.     There were no vitals filed for this  visit.      Subjective Assessment - 10/19/16 1439    Subjective Patient reports that she feels like the ionto may have helped, she reports pain and ache in the lateral hips and thighs today, but reports that she did a lot of vacuuming yesterday.   Currently in Pain? Yes   Pain Score 8    Pain Location Hip   Pain Orientation Right;Left;Lateral   Pain Descriptors / Indicators Aching;Sore   Aggravating Factors  vacuuming   Pain Relieving Factors rest                         [facility] Adult PT Treatment/Exercise - 10/19/16 0001      Therapeutic Activites    Therapeutic Activities ADL's;Lifting   ADL's vacuum, prolonged standing, reaching, bending   Lifting using knees to squat, golfer's lift, PT demonstrated and explained all     Lumbar Exercises: Stretches   Passive Hamstring Stretch 4 reps;20 seconds   ITB Stretch 4 reps;20 seconds   Piriformis Stretch 4 reps;20 seconds     Lumbar Exercises: Aerobic   Tread Mill NuStep Level 4 x 6 minutes     Moist Heat Therapy   Number Minutes Moist Heat 15 Minutes  Moist Heat Location Lumbar Spine;Hip     Electrical Stimulation   Electrical Stimulation Location lumbar spine and both hips    Electrical Stimulation Action IFC to lumbar , premod to each hip   Electrical Stimulation Parameters supine   Electrical Stimulation Goals Pain     Iontophoresis   Type of Iontophoresis Dexamethasone   Location right GT area   Dose 77mA   Time 4 hour patch #2     Manual Therapy   Manual Therapy Soft tissue mobilization   Soft tissue mobilization rolling of the quads and right ITB area in supine                  PT Short Term Goals - 10/05/16 1447      PT SHORT TERM GOAL #1   Title independent with initial HEP   Status Achieved           PT Long Term Goals - 10/19/16 1532      PT LONG TERM GOAL #1   Title decrease pain in the back 25%   Status On-going     PT LONG TERM GOAL #4   Title understand proper  posture and body mechanics   Status Achieved               Plan - 10/19/16 1530    Clinical Impression Statement Patient had some increaed c/o shortness of breath, she had increased pain in the bilateral hips.  I changed to education of posture and body mechanics, some increased stretching and STM.   PT Next Visit Plan see how the treatment today went, advised her to call MD if shortness of breath continued   Consulted and Agree with Plan of Care Patient      Patient will benefit from skilled therapeutic intervention in order to improve the following deficits and impairments:  Abnormal gait, Cardiopulmonary status limiting activity, Decreased activity tolerance, Decreased mobility, Decreased strength, Pain, Decreased range of motion, Difficulty walking, Increased muscle spasms  Visit Diagnosis: Chronic bilateral low back pain with right-sided sciatica  Muscle spasm of back  Chronic pain of left knee  Difficulty in walking, not elsewhere classified  Chronic pain of right knee     Problem List Patient Active Problem List   Diagnosis Date Noted  . Melanoma ([place]) 11/19/2015  . SVT (supraventricular tachycardia) ([place]) 05/20/2015  . Chronic venous insufficiency 10/10/2013  . Neuropathy 04/23/2013  . Psoriasis 04/23/2013  . Deformity of joint 04/23/2013  . 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 Peripheral cyanosis 09/28/2012  . Arthritis of knee, degenerative 09/02/2012  . Routine health maintenance 08/17/2011  . Anemia, iron deficiency 01/22/2011  . Chronic pain 09/22/2010  . Depression with anxiety 09/21/2010  . Spinal stenosis 08/14/2010  . Hypertension 07/08/2010  . Migraine 07/08/2010  . Knee internal derangement 07/08/2010  . Non-cardiac chest pain 07/08/2010   Patient called on 10/20/16 reported that the shortness of breath that I noted during her treatment did not go away and that she went to the ED, she reports to me that she felt like it was the  ionto patches that we used, I reminded her that she was short of breath prior to the patches yesterday, she reports that we used a patch on her two days prior.  We will not do the ionto again.  She reports having a dark colored urine with an odor. Sumner Boast., PT 10/19/2016, 3:33 PM  [place]  [place], [place], [address] Phone: [phone]   Fax:  [phone]  Name: [name] MRN: [id number] Date of Birth: 1950/04/23

## 2016-10-20 ENCOUNTER — Emergency Department (HOSPITAL_COMMUNITY)
Admission: EM | Admit: 2016-10-20 | Discharge: 2016-10-20 | Discharge status: Against Medical Advice | Payer: Medicare Other | Source: Home / Self Care

## 2016-10-20 NOTE — ED Notes (Signed)
Pt called,no answer.

## 2016-10-20 NOTE — ED Notes (Signed)
No answer when called for vitals. 

## 2016-10-21 ENCOUNTER — Emergency Department (HOSPITAL_COMMUNITY): Payer: Medicare Other

## 2016-10-21 ENCOUNTER — Other Ambulatory Visit: Payer: Self-pay

## 2016-10-21 ENCOUNTER — Inpatient Hospital Stay (HOSPITAL_COMMUNITY)
Admission: AD | Admit: 2016-10-21 | Discharge: 2016-10-26 | DRG: 872 | Disposition: A | Discharge status: Home / Self Care | Payer: Medicare Other | Attending: Internal Medicine | Admitting: Internal Medicine

## 2016-10-21 ENCOUNTER — Encounter (HOSPITAL_COMMUNITY): Payer: Self-pay | Admitting: Emergency Medicine

## 2016-10-21 DIAGNOSIS — K591 Functional diarrhea: Secondary | ICD-10-CM | POA: Diagnosis not present

## 2016-10-21 DIAGNOSIS — G8929 Other chronic pain: Secondary | ICD-10-CM | POA: Diagnosis present

## 2016-10-21 DIAGNOSIS — K760 Fatty (change of) liver, not elsewhere classified: Secondary | ICD-10-CM | POA: Diagnosis present

## 2016-10-21 DIAGNOSIS — Z833 Family history of diabetes mellitus: Secondary | ICD-10-CM

## 2016-10-21 DIAGNOSIS — G894 Chronic pain syndrome: Secondary | ICD-10-CM | POA: Diagnosis present

## 2016-10-21 DIAGNOSIS — I129 Hypertensive chronic kidney disease with stage 1 through stage 4 chronic kidney disease, or unspecified chronic kidney disease: Secondary | ICD-10-CM | POA: Diagnosis present

## 2016-10-21 DIAGNOSIS — A020 Salmonella enteritis: Secondary | ICD-10-CM

## 2016-10-21 DIAGNOSIS — R110 Nausea: Secondary | ICD-10-CM

## 2016-10-21 DIAGNOSIS — R509 Fever, unspecified: Secondary | ICD-10-CM | POA: Diagnosis not present

## 2016-10-21 DIAGNOSIS — K529 Noninfective gastroenteritis and colitis, unspecified: Secondary | ICD-10-CM | POA: Diagnosis present

## 2016-10-21 DIAGNOSIS — J189 Pneumonia, unspecified organism: Secondary | ICD-10-CM | POA: Diagnosis not present

## 2016-10-21 DIAGNOSIS — R7401 Elevation of levels of liver transaminase levels: Secondary | ICD-10-CM

## 2016-10-21 DIAGNOSIS — F418 Other specified anxiety disorders: Secondary | ICD-10-CM | POA: Diagnosis not present

## 2016-10-21 DIAGNOSIS — R740 Nonspecific elevation of levels of transaminase and lactic acid dehydrogenase [LDH]: Secondary | ICD-10-CM | POA: Diagnosis not present

## 2016-10-21 DIAGNOSIS — I10 Essential (primary) hypertension: Secondary | ICD-10-CM | POA: Diagnosis present

## 2016-10-21 DIAGNOSIS — Z8249 Family history of ischemic heart disease and other diseases of the circulatory system: Secondary | ICD-10-CM

## 2016-10-21 DIAGNOSIS — Z9181 History of falling: Secondary | ICD-10-CM

## 2016-10-21 DIAGNOSIS — R197 Diarrhea, unspecified: Secondary | ICD-10-CM | POA: Diagnosis not present

## 2016-10-21 DIAGNOSIS — I159 Secondary hypertension, unspecified: Secondary | ICD-10-CM | POA: Diagnosis not present

## 2016-10-21 DIAGNOSIS — Z79899 Other long term (current) drug therapy: Secondary | ICD-10-CM

## 2016-10-21 DIAGNOSIS — S299XXA Unspecified injury of thorax, initial encounter: Secondary | ICD-10-CM | POA: Diagnosis not present

## 2016-10-21 DIAGNOSIS — R109 Unspecified abdominal pain: Secondary | ICD-10-CM | POA: Diagnosis not present

## 2016-10-21 DIAGNOSIS — A419 Sepsis, unspecified organism: Principal | ICD-10-CM

## 2016-10-21 DIAGNOSIS — M549 Dorsalgia, unspecified: Secondary | ICD-10-CM | POA: Diagnosis not present

## 2016-10-21 DIAGNOSIS — E039 Hypothyroidism, unspecified: Secondary | ICD-10-CM | POA: Diagnosis present

## 2016-10-21 DIAGNOSIS — Z7951 Long term (current) use of inhaled steroids: Secondary | ICD-10-CM

## 2016-10-21 DIAGNOSIS — Z7982 Long term (current) use of aspirin: Secondary | ICD-10-CM

## 2016-10-21 DIAGNOSIS — R51 Headache: Secondary | ICD-10-CM | POA: Diagnosis not present

## 2016-10-21 HISTORY — DX: Noninfective gastroenteritis and colitis, unspecified: K52.9

## 2016-10-21 HISTORY — DX: Dorsalgia, unspecified: M54.9

## 2016-10-21 HISTORY — DX: Pneumonia, unspecified organism: J18.9

## 2016-10-21 HISTORY — DX: Other chronic pain: G89.29

## 2016-10-21 HISTORY — DX: Fever, unspecified: R50.9

## 2016-10-21 HISTORY — DX: Pain in unspecified knee: M25.569

## 2016-10-21 LAB — DIFFERENTIAL
Basophils Absolute: 0 10*3/uL (ref 0.0–0.1)
Basophils Relative: 0 %
Eosinophils Absolute: 0 10*3/uL (ref 0.0–0.7)
Eosinophils Relative: 0 %
Lymphocytes Relative: 8 %
Lymphs Abs: 0.9 10*3/uL (ref 0.7–4.0)
Monocytes Absolute: 0.6 10*3/uL (ref 0.1–1.0)
Monocytes Relative: 5 %
Neutro Abs: 9.6 10*3/uL — ABNORMAL HIGH (ref 1.7–7.7)
Neutrophils Relative %: 87 %

## 2016-10-21 LAB — URINALYSIS, ROUTINE W REFLEX MICROSCOPIC
Bacteria, UA: NONE SEEN
Bilirubin Urine: NEGATIVE
Glucose, UA: NEGATIVE mg/dL
Ketones, ur: NEGATIVE mg/dL
Leukocytes, UA: NEGATIVE
Nitrite: NEGATIVE
Protein, ur: 100 mg/dL — AB
Specific Gravity, Urine: 1.021 (ref 1.005–1.030)
pH: 5 (ref 5.0–8.0)

## 2016-10-21 LAB — CBC
HCT: 42.1 % (ref 36.0–46.0)
Hemoglobin: 14.4 g/dL (ref 12.0–15.0)
MCH: 29.4 pg (ref 26.0–34.0)
MCHC: 34.2 g/dL (ref 30.0–36.0)
MCV: 86.1 fL (ref 78.0–100.0)
Platelets: 188 10*3/uL (ref 150–400)
RBC: 4.89 MIL/uL (ref 3.87–5.11)
RDW: 14.7 % (ref 11.5–15.5)
WBC: 11.2 10*3/uL — ABNORMAL HIGH (ref 4.0–10.5)

## 2016-10-21 LAB — COMPREHENSIVE METABOLIC PANEL
ALT: 169 U/L — ABNORMAL HIGH (ref 14–54)
AST: 143 U/L — ABNORMAL HIGH (ref 15–41)
Albumin: 4 g/dL (ref 3.5–5.0)
Alkaline Phosphatase: 127 U/L — ABNORMAL HIGH (ref 38–126)
Anion gap: 10 (ref 5–15)
BUN: 12 mg/dL (ref 6–20)
CO2: 24 mmol/L (ref 22–32)
Calcium: 8.8 mg/dL — ABNORMAL LOW (ref 8.9–10.3)
Chloride: 102 mmol/L (ref 101–111)
Creatinine, Ser: 0.99 mg/dL (ref 0.44–1.00)
GFR calc Af Amer: >60 mL/min (ref >60)
GFR calc non Af Amer: 59 mL/min — ABNORMAL LOW (ref >60)
Glucose, Bld: 121 mg/dL — ABNORMAL HIGH (ref 65–99)
Potassium: 4 mmol/L (ref 3.5–5.1)
Sodium: 136 mmol/L (ref 135–145)
Total Bilirubin: 1 mg/dL (ref 0.3–1.2)
Total Protein: 7.9 g/dL (ref 6.5–8.1)

## 2016-10-21 LAB — TROPONIN I: Troponin I: <0.03 ng/mL (ref <0.03)

## 2016-10-21 LAB — HCG, QUANTITATIVE, PREGNANCY: hCG, Beta Chain, Quant, S: 3 m[IU]/mL (ref <5)

## 2016-10-21 LAB — LACTIC ACID, PLASMA
Lactic Acid, Venous: 1.2 mmol/L (ref 0.5–1.9)
Lactic Acid, Venous: 2.1 mmol/L (ref 0.5–1.9)
Lactic Acid, Venous: 1.9 mmol/L (ref 0.5–1.9)
Lactic Acid, Venous: 2.1 mmol/L (ref 0.5–1.9)

## 2016-10-21 LAB — I-STAT BETA HCG BLOOD, ED (MC, WL, AP ONLY): I-stat hCG, quantitative: 21.6 m[IU]/mL — ABNORMAL HIGH (ref <5)

## 2016-10-21 LAB — LIPASE, BLOOD: Lipase: 23 U/L (ref 11–51)

## 2016-10-21 MED ORDER — ONDANSETRON HCL 4 MG/2ML IJ SOLN: 4.0000 mg | INTRAMUSCULAR | Status: DC | PRN

## 2016-10-21 MED ORDER — ACETAMINOPHEN 325 MG PO TABS
650.0000 mg | ORAL_TABLET | ORAL | Status: AC | PRN
  Administered 2016-10-21 – 2016-10-23 (×4): 650 mg via ORAL
  Filled 2016-10-21 (×4): qty 2

## 2016-10-21 MED ORDER — SUCRALFATE 1 G PO TABS
1.0000 g | ORAL_TABLET | Freq: Every day | ORAL | Status: DC
  Administered 2016-10-22 – 2016-10-26 (×5): 1 g via ORAL
  Filled 2016-10-21 (×5): qty 1

## 2016-10-21 MED ORDER — MORPHINE SULFATE (PF) 2 MG/ML IV SOLN
2.0000 mg | INTRAVENOUS | Status: DC | PRN
  Administered 2016-10-21: 2 mg via INTRAVENOUS
  Filled 2016-10-21: qty 1

## 2016-10-21 MED ORDER — BOOST / RESOURCE BREEZE PO LIQD
1.0000 | Freq: Three times a day (TID) | ORAL | Status: DC
  Administered 2016-10-21 – 2016-10-26 (×10): 1 via ORAL

## 2016-10-21 MED ORDER — LEVOTHYROXINE SODIUM 75 MCG PO TABS
75.0000 ug | ORAL_TABLET | Freq: Every day | ORAL | Status: DC
  Administered 2016-10-21 – 2016-10-26 (×6): 75 ug via ORAL
  Filled 2016-10-21 (×7): qty 1

## 2016-10-21 MED ORDER — FLUTICASONE PROPIONATE 50 MCG/ACT NA SUSP
2.0000 | Freq: Every day | NASAL | Status: DC
  Administered 2016-10-22 – 2016-10-26 (×5): 2 via NASAL
  Filled 2016-10-21: qty 16

## 2016-10-21 MED ORDER — PIPERACILLIN-TAZOBACTAM 3.375 G IVPB
3.3750 g | Freq: Three times a day (TID) | INTRAVENOUS | Status: DC
  Administered 2016-10-21 – 2016-10-22 (×4): 3.375 g via INTRAVENOUS
  Filled 2016-10-21 (×4): qty 50

## 2016-10-21 MED ORDER — ACETAMINOPHEN 325 MG PO TABS: 650.0000 mg | ORAL_TABLET | Freq: Once | ORAL | Status: DC

## 2016-10-21 MED ORDER — TRAMADOL HCL 50 MG PO TABS
50.0000 mg | ORAL_TABLET | Freq: Four times a day (QID) | ORAL | Status: DC | PRN
  Administered 2016-10-21 – 2016-10-24 (×2): 50 mg via ORAL
  Filled 2016-10-21 (×2): qty 1

## 2016-10-21 MED ORDER — SODIUM CHLORIDE 0.9 % IV SOLN
INTRAVENOUS | Status: DC
  Administered 2016-10-21 – 2016-10-22 (×3): via INTRAVENOUS
  Administered 2016-10-23: 1000 mL via INTRAVENOUS
  Administered 2016-10-23 – 2016-10-24 (×3): via INTRAVENOUS
  Administered 2016-10-24: 100 mL/h via INTRAVENOUS
  Administered 2016-10-25 – 2016-10-26 (×2): via INTRAVENOUS

## 2016-10-21 MED ORDER — IOPAMIDOL (ISOVUE-300) INJECTION 61%
INTRAVENOUS | Status: AC
  Filled 2016-10-21: qty 100

## 2016-10-21 MED ORDER — PANTOPRAZOLE SODIUM 40 MG PO TBEC
40.0000 mg | DELAYED_RELEASE_TABLET | Freq: Every day | ORAL | Status: DC
  Administered 2016-10-21 – 2016-10-26 (×6): 40 mg via ORAL
  Filled 2016-10-21 (×6): qty 1

## 2016-10-21 MED ORDER — CIPROFLOXACIN IN D5W 400 MG/200ML IV SOLN
400.0000 mg | Freq: Once | INTRAVENOUS | Status: AC
  Administered 2016-10-21: 400 mg via INTRAVENOUS
  Filled 2016-10-21: qty 200

## 2016-10-21 MED ORDER — METOPROLOL SUCCINATE ER 25 MG PO TB24
25.0000 mg | ORAL_TABLET | Freq: Every day | ORAL | Status: DC
  Administered 2016-10-21 – 2016-10-26 (×6): 25 mg via ORAL
  Filled 2016-10-21 (×6): qty 1

## 2016-10-21 MED ORDER — TIZANIDINE HCL 4 MG PO TABS
2.0000 mg | ORAL_TABLET | Freq: Once | ORAL | Status: AC
  Administered 2016-10-21: 2 mg via ORAL
  Filled 2016-10-21: qty 1

## 2016-10-21 MED ORDER — ASPIRIN EC 81 MG PO TBEC
81.0000 mg | DELAYED_RELEASE_TABLET | ORAL | Status: DC
  Administered 2016-10-22 – 2016-10-26 (×3): 81 mg via ORAL
  Filled 2016-10-21 (×3): qty 1

## 2016-10-21 MED ORDER — SODIUM CHLORIDE 0.9 % IV BOLUS (SEPSIS)
500.0000 mL | Freq: Once | INTRAVENOUS | Status: AC
  Administered 2016-10-21: 500 mL via INTRAVENOUS

## 2016-10-21 MED ORDER — METRONIDAZOLE IN NACL 5-0.79 MG/ML-% IV SOLN
500.0000 mg | Freq: Once | INTRAVENOUS | Status: AC
  Administered 2016-10-21: 500 mg via INTRAVENOUS
  Filled 2016-10-21: qty 100

## 2016-10-21 MED ORDER — SODIUM CHLORIDE 0.9 % IJ SOLN
INTRAMUSCULAR | Status: AC
  Filled 2016-10-21: qty 50

## 2016-10-21 MED ORDER — AMITRIPTYLINE HCL 10 MG PO TABS
20.0000 mg | ORAL_TABLET | Freq: Every day | ORAL | Status: DC
  Administered 2016-10-21 – 2016-10-25 (×5): 20 mg via ORAL
  Filled 2016-10-21 (×5): qty 2

## 2016-10-21 MED ORDER — IOPAMIDOL (ISOVUE-300) INJECTION 61%
100.0000 mL | Freq: Once | INTRAVENOUS | Status: AC | PRN
Start: 2016-10-21 — End: 2016-10-21
  Administered 2016-10-21: 100 mL via INTRAVENOUS

## 2016-10-21 MED ORDER — ENOXAPARIN SODIUM 40 MG/0.4ML ~~LOC~~ SOLN
40.0000 mg | SUBCUTANEOUS | Status: DC
  Administered 2016-10-21 – 2016-10-25 (×5): 40 mg via SUBCUTANEOUS
  Filled 2016-10-21 (×5): qty 0.4

## 2016-10-21 MED ORDER — CLONAZEPAM 1 MG PO TABS
1.0000 mg | ORAL_TABLET | Freq: Every evening | ORAL | Status: DC | PRN
  Administered 2016-10-22 – 2016-10-25 (×4): 1 mg via ORAL
  Filled 2016-10-21 (×5): qty 1

## 2016-10-21 MED ORDER — FENTANYL CITRATE (PF) 100 MCG/2ML IJ SOLN
50.0000 ug | INTRAMUSCULAR | Status: DC | PRN
  Administered 2016-10-21: 50 ug via INTRAVENOUS
  Filled 2016-10-21: qty 2

## 2016-10-21 MED ORDER — ONDANSETRON HCL 4 MG/2ML IJ SOLN
4.0000 mg | Freq: Once | INTRAMUSCULAR | Status: AC | PRN
  Administered 2016-10-21: 4 mg via INTRAVENOUS
  Filled 2016-10-21: qty 2

## 2016-10-21 NOTE — Progress Notes (Signed)
PT Cancellation Note  Patient Details Name: [name] MRN: [id number] DOB: 1950/12/26   Cancelled Treatment:    Reason Eval/Treat Not Completed: PT screened, no needs identified, will sign off Pt independently ambulating from bathroom back to bed with IV pole upon arriving at door.  Pt requesting comb to brush her hair.  Pt stated she felt to weak to participate however appears to be moving well within her room.  Would recommend f/u HHPT upon d/c if pt agreeable due to reported weakness and previous notes stating pt had a fall a couple weeks ago.  PT to sign off as pt can ambulate with staff during acute stay (due to enteric precautions) and can f/u with HHPT if agreeable.   Kenyona Rena,KATHrine E 10/21/2016, 2:11 PM Carmelia Bake, PT, DPT 10/21/2016 Pager: [phone]

## 2016-10-21 NOTE — H&P (Signed)
History and Physical    [name] ZOX:[id number] DOB: 05/08/1950 DOA: 10/21/2016  PCP: Kelton Pillar, MD  Patient coming from: Home.   I have personally briefly reviewed patient's old medical records in [place]  Chief Complaint: General Malaise , fever, chills, nausea,diarrhea since 3 day.  HPI: [name] is a 66 y.o. female with medical history significant of hypertension, hypothyroidism, chronic pain syndrome, presents with fever, chills, abdominal pain, nausea,diarrhea , cough for 3 days. On arrival to ED, she was found to be febrile, with tmax of 100.8. CT abd shows colits and CXR shows Mild patchy linear density over the right midlung and left base likely atelectasis. She reports having cough with productive sputum almost for a month and worsened over the last three days. No sick contacts or travel recently. She reports mild generalized abdominal pain with diarrhea for 3 days associated with nausea and no vomiting. She also reports having a fall 2 weeks ago. Denies any pain . She reports chronic back pain for which she takes tramadol. Pt also reports taking tciks off her skin recently.  She was referred to medical service for further evaluation of her colits and possible atypical pneumonia.   Review of Systems: As per HPI otherwise 10 point review of systems negative.    Past Medical History:  Diagnosis Date  . Allergy   . Arthritis   . Chronic back pain   . Chronic kidney disease   . Chronic knee pain   . Collagen vascular disease ([place])   . Deformity of joint 04/23/2013  . Derangement of knee, left 2009   started with fall at work. Has had repair patella and torn meniscus  . High cholesterol   . Hx of appendectomy   . Hypertension   . Melanoma ([place]) 11/19/2015  . Migraine headache    hormonal related - less frequent off hormone replacement  . Migraines   . Neuropathy 04/23/2013  . Psoriasis 04/23/2013  . Raynaud disease   . SVT  (supraventricular tachycardia) ([place])   . Synovial cyst of lumbar facet joint    lower back - s/p surgery     Past Surgical History:  Procedure Laterality Date  . APPENDECTOMY  1971  . CERVICAL DISCECTOMY  1999   diskectomy with fixation:plate and screws  . KNEE ARTHROSCOPY W/ MENISCAL REPAIR  09   left knee  . L4-5 lumbar fusion  September 02, 2010   Dr. Hal Neer:  minimally invasive transforaminal interbody fusion with spacer  . OVARIAN CYST SURGERY  1972  . PATELLAR REEFING  '09   repair of fractured patella after fall  . SPINE SURGERY    . SYNOVIAL CYST EXCISION  2007   lumbar spine  . VENOUS ABLATION     for pain in the groin - VVTS did procedure. Has some residual discomfort at the  ablation site.      reports that she has never smoked. She has never used smokeless tobacco. She reports that she does not drink alcohol or use drugs.  Allergies  Allergen Reactions  . Lyrica [Pregabalin] Anaphylaxis  . Nsaids Swelling    swelling  . Tolmetin Swelling  . Prednisone Swelling  . Tape Other (See Comments)    *adhesive* Reaction- blisters   . Captopril   . Lisinopril Swelling  . Caine-1 [Lidocaine Hcl] Palpitations  . Celebrex [Celecoxib] Palpitations  . Codeine Palpitations  . Nephron [Epinephrine] Palpitations    NASAL SPRAY  . Novocain [Procaine] Other (See  Comments)    dizziness  . Other Palpitations    Glucocorticoids/Steroids  . Procaine Hcl Palpitations  . Sulfa Antibiotics Palpitations    Family History  Problem Relation Age of Onset  . Diabetes Mother   . COPD Mother   . Aortic aneurysm Mother   . Hypertension Mother   . Hypothyroidism Mother   . Cancer Father        lung  . Hyperlipidemia Father   . Hypertension Father   . Heart disease Father   . Diabetes Father   . Diabetes Brother    Reviewed.  Prior to Admission medications   Medication Sig Start Date End Date Taking? Authorizing Provider  acetaminophen (TYLENOL) 500 MG tablet Take 1,000 mg by  mouth every 6 (six) hours as needed for mild pain, moderate pain or headache.    Yes [provider]  amitriptyline (ELAVIL) 10 MG tablet Take 4 tablets (40 mg total) by mouth at bedtime. Patient taking differently: Take 20 mg by mouth at bedtime.    Yes Hendricks Limes, MD  aspirin EC 81 MG tablet Take 81 mg by mouth every other day.    Yes [provider]  B Complex Vitamins (VITAMIN B COMPLEX PO) Take 1 tablet by mouth daily.    Yes [provider]  cholecalciferol (VITAMIN D) 1000 units tablet Take 1,000 Units by mouth daily.   Yes [provider]  clonazePAM (KLONOPIN) 1 MG tablet Take 1 tablet (1 mg total) by mouth at bedtime as needed for anxiety. Patient taking differently: Take 1 mg by mouth at bedtime.    Yes Norins, Heinz Knuckles, MD  esomeprazole (NEXIUM) 20 MG capsule Take 20 mg by mouth daily before breakfast.   Yes [provider]  fluticasone (FLONASE) 50 MCG/ACT nasal spray Place into both nostrils daily.   Yes [provider]  Guaifenesin (MUCINEX MAXIMUM STRENGTH) 1200 MG TB12 Take 1 tablet (1,200 mg total) by mouth every 12 (twelve) hours as needed. 07/25/16  Yes English, Colletta Maryland D, PA  Hyoscyamine Sulfate SL (LEVSIN/SL) 0.125 MG SUBL Place 0.125 mg under the tongue 4 (four) times daily. 04/12/16  Yes English, Colletta Maryland D, PA  levothyroxine (SYNTHROID, LEVOTHROID) 25 MCG tablet Take 75 mcg by mouth daily before breakfast.    Yes [provider]  Magnesium 300 MG CAPS Take 300 mg by mouth daily.   Yes [provider]  METOPROLOL SUCCINATE ER PO Take 25 mg by mouth daily.    Yes [provider]  Multiple Vitamins-Minerals (ZINC PO) Take by mouth.   Yes [provider]  sucralfate (CARAFATE) 1 G tablet Take 1 g by mouth daily. 09/03/14  Yes [provider]  tiZANidine (ZANAFLEX) 2 MG tablet Take 2 mg by mouth 2 (two) times daily. Reported on 04/05/2015   Yes [provider]    traMADol (ULTRAM) 50 MG tablet Take by mouth every 6 (six) hours as needed.   Yes [provider]  tretinoin (RETIN-A) 0.025 % cream Apply topically at bedtime.   Yes [provider]  vitamin E 400 UNIT capsule Take 400 Units by mouth every other day. Reported on 04/05/2015   Yes [provider]  zinc gluconate 50 MG tablet Take 100 mg by mouth daily.    Yes [provider]    Physical Exam: Vitals:   10/21/16 0629 10/21/16 0801 10/21/16 1000 10/21/16 1040  BP: 130/68 (!) 160/71 134/66 128/62  Pulse: (!) 110 (!) 111 (!) 109 Marland Kitchen)  107  Resp: 18 18 (!) 37 17  Temp: 99.8 F (37.7 C) (!) 100.8 F (38.2 C)    TempSrc: Oral Rectal    SpO2: 96% 96%  94%  Weight: 98.9 kg (218 lb)     Height: 5\' 5"  (1.651 m)       Constitutional: NAD, calm, comfortable Vitals:   10/21/16 0629 10/21/16 0801 10/21/16 1000 10/21/16 1040  BP: 130/68 (!) 160/71 134/66 128/62  Pulse: (!) 110 (!) 111 (!) 109 (!) 107  Resp: 18 18 (!) 37 17  Temp: 99.8 F (37.7 C) (!) 100.8 F (38.2 C)    TempSrc: Oral Rectal    SpO2: 96% 96%  94%  Weight: 98.9 kg (218 lb)     Height: 5\' 5"  (1.651 m)      Eyes: PERRL, lids and conjunctivae normal ENMT: Mucous membranes are moist. Posterior pharynx clear of any exudate or lesions.Normal dentition.  Neck: normal, supple, no masses, no thyromegaly Respiratory: clear to auscultation bilaterally, no wheezing, no crackles. Normal respiratory effort. No accessory muscle use.  Cardiovascular: Regular rate and rhythm, no murmurs / rubs / gallops. No extremity edema. 2+ pedal pulses. No carotid bruits.  Abdomen: mild general abd tenderness no masses palpated. No hepatosplenomegaly. Bowel sounds positive.  Musculoskeletal: no clubbing / cyanosis. No joint deformity upper and lower extremities. Good ROM, no contractures. Normal muscle tone.  Skin: no rashes, lesions, ulcers. No induration Neurologic: CN 2-12 grossly intact. Sensation intact, DTR  normal. Strength 5/5 in all 4.  Psychiatric: Normal judgment and insight. Alert and oriented x 3. Normal mood.    Labs on Admission: I have personally reviewed following labs and imaging studies  CBC:  Recent Labs Lab 10/19/16 2033 10/21/16 0703  WBC 12.0* 11.2*  NEUTROABS 9.9* 9.6*  HGB 14.7 14.4  HCT 43.4 42.1  MCV 85.8 86.1  PLT 231 448   Basic Metabolic Panel:  Recent Labs Lab 10/19/16 2033 10/21/16 0703  NA 136 136  K 4.4 4.0  CL 101 102  CO2 25 24  GLUCOSE 112* 121*  BUN 12 12  CREATININE 0.99 0.99  CALCIUM 9.4 8.8*   GFR: Estimated Creatinine Clearance: 66 mL/min (by C-G formula based on SCr of 0.99 mg/dL). Liver Function Tests:  Recent Labs Lab 10/19/16 2033 10/21/16 0703  AST 64* 143*  ALT 75* 169*  ALKPHOS 114 127*  BILITOT 0.9 1.0  PROT 7.7 7.9  ALBUMIN 4.3 4.0    Recent Labs Lab 10/21/16 0703  LIPASE 23   No results for input(s): AMMONIA in the last 168 hours. Coagulation Profile: No results for input(s): INR, PROTIME in the last 168 hours. Cardiac Enzymes:  Recent Labs Lab 10/21/16 0755  TROPONINI <0.03   BNP (last 3 results)  Recent Labs  07/25/16 0958  PROBNP 119   HbA1C: No results for input(s): HGBA1C in the last 72 hours. CBG: No results for input(s): GLUCAP in the last 168 hours. Lipid Profile: No results for input(s): CHOL, HDL, LDLCALC, TRIG, CHOLHDL, LDLDIRECT in the last 72 hours. Thyroid Function Tests: No results for input(s): TSH, T4TOTAL, FREET4, T3FREE, THYROIDAB in the last 72 hours. Anemia Panel: No results for input(s): VITAMINB12, FOLATE, FERRITIN, TIBC, IRON, RETICCTPCT in the last 72 hours. Urine analysis:    Component Value Date/Time   COLORURINE AMBER (A) 10/21/2016 0703   APPEARANCEUR HAZY (A) 10/21/2016 0703   LABSPEC 1.021 10/21/2016 0703   PHURINE 5.0 10/21/2016 0703   GLUCOSEU NEGATIVE 10/21/2016 0703   GLUCOSEU NEGATIVE  07/25/2010 1555   HGBUR SMALL (A) 10/21/2016 0703   BILIRUBINUR  NEGATIVE 10/21/2016 0703   BILIRUBINUR negative 04/12/2016 1158   BILIRUBINUR neg 04/15/2014 0941   KETONESUR NEGATIVE 10/21/2016 0703   PROTEINUR 100 (A) 10/21/2016 0703   UROBILINOGEN 0.2 04/12/2016 1158   UROBILINOGEN 0.2 10/25/2014 1204   NITRITE NEGATIVE 10/21/2016 0703   LEUKOCYTESUR NEGATIVE 10/21/2016 0703    Radiological Exams on Admission: Dg Chest 2 View  Result Date: 10/21/2016 CLINICAL DATA:  Fever, chills, nausea, vomiting and diarrhea. Leg pain 1-2 weeks worsening today. Recent falls and multiple previous back surgeries. Headache. EXAM: CHEST  2 VIEW COMPARISON:  10/19/2016 FINDINGS: Lungs are adequately inflated with patchy linear density over the right midlung and left lung base suggesting atelectasis. No effusion or pneumothorax. Cardiomediastinal silhouette is within normal. Anterior fusion hardware over the lower cervical spine unchanged. Minimal degenerate change of the spine. IMPRESSION: Mild patchy linear density over the right midlung and left base likely atelectasis. Cannot completely exclude early infection. Electronically Signed   By: Marin Olp M.D.   On: 10/21/2016 08:37   Dg Chest 2 View  Result Date: 10/19/2016 CLINICAL DATA:  Acute chest pain and fever for 1 day. EXAM: CHEST  2 VIEW COMPARISON:  07/25/2016 and prior chest radiograph FINDINGS: The cardiomediastinal silhouette is unremarkable. There is no evidence of focal airspace disease, pulmonary edema, suspicious pulmonary nodule/mass, pleural effusion, or pneumothorax. No acute bony abnormalities are identified. IMPRESSION: No active cardiopulmonary disease. Electronically Signed   By: Margarette Canada M.D.   On: 10/19/2016 20:54   Ct Abdomen Pelvis W Contrast  Result Date: 10/21/2016 CLINICAL DATA:  Abdominal pain and diarrhea EXAM: CT ABDOMEN AND PELVIS WITH CONTRAST TECHNIQUE: Multidetector CT imaging of the abdomen and pelvis was performed using the standard protocol following bolus administration of  intravenous contrast. CONTRAST:  190mL ISOVUE-300 IOPAMIDOL (ISOVUE-300) INJECTION 61% COMPARISON:  11/18/2015 FINDINGS: Lower chest: Mild bibasilar atelectatic changes are noted. No sizable effusion is seen. Hepatobiliary: Fatty infiltration of the liver is noted. The gallbladder is within normal limits. Pancreas: Unremarkable. No pancreatic ductal dilatation or surrounding inflammatory changes. Spleen: Normal in size without focal abnormality. Adrenals/Urinary Tract: Adrenal glands are unremarkable. Kidneys are normal, without renal calculi, focal lesion, or hydronephrosis. Bladder is unremarkable. Stomach/Bowel: The appendix has been surgically removed. Mild pericolonic inflammatory changes are noted in the ascending colon which may represent focal colitis. The remainder the colon is within normal limits. No obstructive changes are seen. Vascular/Lymphatic: Aortic atherosclerosis. No enlarged abdominal or pelvic lymph nodes. Reproductive: Uterus and bilateral adnexa are unremarkable. Other: No abdominal wall hernia or abnormality. No abdominopelvic ascites. Musculoskeletal: Postsurgical changes are noted from L3-S1. IMPRESSION: Changes of mild colitis in the ascending colon. No perforation or abscess is seen. No other acute abnormality is noted. Electronically Signed   By: Inez Catalina M.D.   On: 10/21/2016 08:34    EKG: Independently reviewed.   Assessment/Plan Active Problems:   Hypertension   Depression with anxiety   Chronic pain   Colitis   CAP (community acquired pneumonia)   Fever chills   Nausea, abdominal pain and diarrhea explain the symptoms of Colitis:  Admit for IV antibiotics.  Start with IV zosyn, IV fluids, symptomatic management with IV anti emetics.  GI pathogen PCR will be sent.  Blood cultures ordered and sent.    Atypical pneumonia:  Start patient on IV zosyn, sputum for culture.   oxygen to keep sats greater than 90%.    Hypertension:  Well controlled.    Depression with anxiety:  Resume home medications.    Hypothyroidism:  Resume synthroid.  Get TSH.   Mild leukocytosis:  Possibly from the atypical pneumonia and colitis    Elevated liver function tests: No tender ness in the RUQ.  Fatty liver infiltration, GB is wnl.    Elevated lactic acid:  Possibly from SIRS, infection. Gentle hydration.    H/o fall , chronic back pain:  PT evaluation and pain control.   H/o tick bites:  RMSF  Work up pending.    DVT prophylaxis: lovenox.  Code Status: full code.  Family Communication: family/ husband at bedside.  Disposition Plan: pending resolution of colitis and pneumonia.  Consults called: none.  Admission status: obs/ med surg.    Hosie Poisson MD [facility] Pager [phone]   If 7PM-7AM, please contact night-coverage [email or web address] Password [facility]  10/21/2016, 10:44 AM

## 2016-10-21 NOTE — ED Triage Notes (Signed)
Pt comes to ed, via ptar ( c/o is fever, chills, NVD.  Leg pain. Was at [facility]  2 days ago and did not want wait around, v/s 160/90, pulse 130  SVT, cbg 120, SpO2 95 room air,  Pt was bitten by a tick recently,  Pt c/o of bad headache,    recent fall and hx of back issues.

## 2016-10-21 NOTE — Progress Notes (Signed)
Paged K. Threasa Alpha, MD, Hospitalist for Lactic acid 2.1. Awaiting callback

## 2016-10-21 NOTE — Progress Notes (Signed)
Pt has had several episodes of diarrhea today.  A fever of 103.3, MD was notified. Complaining of headache that she has had for past 3 days.  Family wondering if maybe she should have a scan for her head. Says she feels very weak upon standing.

## 2016-10-21 NOTE — ED Provider Notes (Signed)
[facility] DEPT Provider Note   CSN: [id number] Arrival date & time: 10/21/16  7253     History   Chief Complaint Chief Complaint  Patient presents with  . Nausea  . Emesis  . Diarrhea  . Generalized Body Aches    tick bite recently     HPI [name] is a 66 y.o. female.   Emesis   Associated symptoms include diarrhea.  Diarrhea   Associated symptoms include vomiting.    Pt was seen at 0740. Per pt, c/o gradual onset and persistence of multiple intermittent episodes of diarrhea that began 3 days ago. Has been associated with nausea and generalized abd pan. Describes the stools as "watery." Denies vomiting, no CP/SOB, no fevers, no black or blood in stools. Pt also c/o multiple other symptoms including: acute flair of her chronic low back pain x3 weeks (currently attending physical therapy for this, "wearing new cortisone patches this week"), "live around a lot of ticks" and unsure if has had a tick bite, "might have been exposed to strep throat" by another family member, and "coughing for a while." Denies sore throat, no rash. Denies any change in her usual chronic pain pattern.  Pain worsens with palpation of the area and body position changes. Denies incont/retention of bowel or bladder, no saddle anesthesia, no focal motor weakness, no tingling/numbness in extremities.    Past Medical History:  Diagnosis Date  . Allergy   . Arthritis   . Chronic back pain   . Chronic kidney disease   . Chronic knee pain   . Collagen vascular disease ([place])   . Deformity of joint 04/23/2013  . Derangement of knee, left 2009   started with fall at work. Has had repair patella and torn meniscus  . High cholesterol   . Hx of appendectomy   . Hypertension   . Melanoma ([place]) 11/19/2015  . Migraine headache    hormonal related - less frequent off hormone replacement  . Migraines   . Neuropathy 04/23/2013  . Psoriasis 04/23/2013  . Raynaud disease   . SVT (supraventricular  tachycardia) ([place])   . Synovial cyst of lumbar facet joint    lower back - s/p surgery     Patient Active Problem List   Diagnosis Date Noted  . Melanoma ([place]) 11/19/2015  . SVT (supraventricular tachycardia) ([place]) 05/20/2015  . Chronic venous insufficiency 10/10/2013  . Neuropathy 04/23/2013  . Psoriasis 04/23/2013  . Deformity of joint 04/23/2013  . 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 Peripheral cyanosis 09/28/2012  . Arthritis of knee, degenerative 09/02/2012  . Routine health maintenance 08/17/2011  . Anemia, iron deficiency 01/22/2011  . Chronic pain 09/22/2010  . Depression with anxiety 09/21/2010  . Spinal stenosis 08/14/2010  . Hypertension 07/08/2010  . Migraine 07/08/2010  . Knee internal derangement 07/08/2010  . Non-cardiac chest pain 07/08/2010    Past Surgical History:  Procedure Laterality Date  . APPENDECTOMY  1971  . CERVICAL DISCECTOMY  1999   diskectomy with fixation:plate and screws  . KNEE ARTHROSCOPY W/ MENISCAL REPAIR  09   left knee  . L4-5 lumbar fusion  September 02, 2010   Dr. Hal Neer:  minimally invasive transforaminal interbody fusion with spacer  . OVARIAN CYST SURGERY  1972  . PATELLAR REEFING  '09   repair of fractured patella after fall  . SPINE SURGERY    . SYNOVIAL CYST EXCISION  2007   lumbar spine  . VENOUS ABLATION     for pain in  the groin - VVTS did procedure. Has some residual discomfort at the  ablation site.     OB History    No data available       Home Medications    Prior to Admission medications   Medication Sig Start Date End Date Taking? Authorizing Provider  acetaminophen (TYLENOL) 500 MG tablet Take 500 mg by mouth every 6 (six) hours as needed for mild pain, moderate pain or headache.     [provider]  amitriptyline (ELAVIL) 10 MG tablet Take 4 tablets (40 mg total) by mouth at bedtime. Patient taking differently: Take 20 mg by mouth at bedtime.     Hendricks Limes, MD  aspirin EC 81 MG  tablet Take 81 mg by mouth every other day.     [provider]  B Complex Vitamins (VITAMIN B COMPLEX PO) Take 1 tablet by mouth daily.     [provider]  captopril (CAPOTEN) 50 MG tablet Take 50 mg by mouth 3 (three) times daily.    [provider]  cefdinir (OMNICEF) 300 MG capsule Take 1 capsule (300 mg total) by mouth 2 (two) times daily. 07/25/16   Ivar Drape D, PA  cholecalciferol (VITAMIN D) 1000 units tablet Take 1,000 Units by mouth daily.    [provider]  clonazePAM (KLONOPIN) 1 MG tablet Take 1 tablet (1 mg total) by mouth at bedtime as needed for anxiety. Patient taking differently: Take 1 mg by mouth at bedtime.     Norins, Heinz Knuckles, MD  esomeprazole (NEXIUM) 20 MG packet Take 20 mg by mouth daily before breakfast.    [provider]  fluticasone (FLONASE) 50 MCG/ACT nasal spray Place into both nostrils daily.    [provider]  Guaifenesin (MUCINEX MAXIMUM STRENGTH) 1200 MG TB12 Take 1 tablet (1,200 mg total) by mouth every 12 (twelve) hours as needed. 07/25/16   Ivar Drape D, PA  Hyoscyamine Sulfate SL (LEVSIN/SL) 0.125 MG SUBL Place 0.125 mg under the tongue 4 (four) times daily. 04/12/16   Ivar Drape D, PA  levothyroxine (SYNTHROID, LEVOTHROID) 25 MCG tablet Take 75 mcg by mouth daily before breakfast.     [provider]  loratadine (CLARITIN) 10 MG tablet Take 10 mg by mouth daily.    [provider]  METOPROLOL SUCCINATE ER PO Take 25 mg by mouth daily.     [provider]  Multiple Vitamins-Minerals (ZINC PO) Take by mouth.    [provider]  naproxen (NAPROSYN) 375 MG tablet Take by mouth.    [provider]  ondansetron (ZOFRAN-ODT) 8 MG disintegrating tablet Take 1 tablet (8 mg total) by mouth every 8 (eight) hours as needed for nausea. Patient not taking: Reported on 07/25/2016 04/12/16   Ivar Drape D, PA  scopolamine (TRANSDERM-SCOP) 1  MG/3DAYS Place 1 patch onto the skin every 3 (three) days.    [provider]  sucralfate (CARAFATE) 1 G tablet Take 1 g by mouth daily. 09/03/14   [provider]  tiZANidine (ZANAFLEX) 2 MG tablet Take 2 mg by mouth 2 (two) times daily. Reported on 04/05/2015    [provider]  traMADol (ULTRAM) 50 MG tablet Take by mouth every 6 (six) hours as needed.    [provider]  tretinoin (RETIN-A) 0.025 % cream Apply topically at bedtime.    [provider]  vitamin E 400 UNIT capsule Take 400 Units by mouth every other day. Reported on 04/05/2015  [provider]  zinc gluconate 50 MG tablet Take 50 mg by mouth daily.    [provider]    Family History Family History  Problem Relation Age of Onset  . Diabetes Mother   . COPD Mother   . Aortic aneurysm Mother   . Hypertension Mother   . Hypothyroidism Mother   . Cancer Father        lung  . Hyperlipidemia Father   . Hypertension Father   . Heart disease Father   . Diabetes Father   . Diabetes Brother     Social History Social History  Substance Use Topics  . Smoking status: Never Smoker  . Smokeless tobacco: Never Used  . Alcohol use No     Allergies   Lyrica [pregabalin]; Nsaids; Tolmetin; Prednisone; Tape; Captopril; Lisinopril; Caine-1 [lidocaine hcl]; Celebrex [celecoxib]; Codeine; Nephron [epinephrine]; Novocain [procaine]; Other; Procaine hcl; and Sulfa antibiotics   Review of Systems Review of Systems  Gastrointestinal: Positive for diarrhea and vomiting.  ROS: Statement: All systems negative except as marked or noted in the HPI; Constitutional: Negative for fever and chills. ; ; Eyes: Negative for eye pain, redness and discharge. ; ; ENMT: Negative for ear pain, hoarseness, nasal congestion, sinus pressure and sore throat. ; ; Cardiovascular: Negative for chest pain, palpitations, diaphoresis, dyspnea and peripheral edema. ; ; Respiratory: +cough. Negative  for wheezing and stridor. ; ; Gastrointestinal: +nausea, diarrhea, abd pain. Negative for vomiting, blood in stool, hematemesis, jaundice and rectal bleeding. . ; ; Genitourinary: Negative for dysuria, flank pain and hematuria. ; ; Musculoskeletal: +chronic back pain. Negative for neck pain. Negative for swelling and deformity.; ; Skin: Negative for pruritus, rash, abrasions, blisters, bruising and skin lesion.; ; Neuro: Negative for headache, lightheadedness and neck stiffness. Negative for weakness, altered level of consciousness, altered mental status, extremity weakness, paresthesias, involuntary movement, seizure and syncope.      Physical Exam Updated Vital Signs BP 130/68 (BP Location: Left Arm)   Pulse (!) 110   Temp 99.8 F (37.7 C) (Oral)   Resp 18   Ht 5\' 5"  (1.651 m)   Wt 98.9 kg (218 lb)   SpO2 96%   BMI 36.28 kg/m   Patient Vitals for the past 24 hrs:  BP Temp Temp src Pulse Resp SpO2 Height Weight  10/21/16 0801 (!) 160/71 (!) 100.8 F (38.2 C) Rectal (!) 111 18 96 % - -  10/21/16 0629 130/68 99.8 F (37.7 C) Oral (!) 110 18 96 % 5\' 5"  (1.651 m) 98.9 kg (218 lb)  10/21/16 0628 - - - - - 100 % - -    09:13 Orthostatic Vital Signs AM  Orthostatic Lying   BP- Lying: 150/72  Pulse- Lying: 110      Orthostatic Sitting  BP- Sitting: 163/76  Pulse- Sitting: 110      Orthostatic Standing at 0 minutes  BP- Standing at 0 minutes: 143/80  Pulse- Standing at 0 minutes: 111     Physical Exam 0745: Physical examination:  Nursing notes reviewed; Vital signs and O2 SAT reviewed;  Constitutional: Well developed, Well nourished, In no acute distress; Head:  Normocephalic, atraumatic; Eyes: EOMI, PERRL, No scleral icterus; ENMT: Mouth and pharynx normal, Mucous membranes dry; Neck: Supple, Full range of motion, No lymphadenopathy. No meningeal signs.; Cardiovascular: Tachycardic rate and rhythm, No gallop; Respiratory: Breath sounds clear & equal bilaterally, No wheezes.   Speaking full sentences with ease, Normal respiratory effort/excursion; Chest: Nontender, Movement normal; Abdomen: Soft, +mild  diffuse tenderness to palp. No rebound or guarding. Nondistended, Normal bowel sounds; Genitourinary: No CVA tenderness; Extremities: Pulses normal, No tenderness, No edema, No calf edema or asymmetry.; Neuro: AA&Ox3, tangential historian. Major CN grossly intact. No facial droop. Speech clear. No gross focal motor or sensory deficits in extremities.; Skin: Color normal, Warm, Dry.   ED Treatments / Results  Labs (all labs ordered are listed, but only abnormal results are displayed)   EKG  EKG Interpretation None       Radiology   Procedures Procedures (including critical care time)  Medications Ordered in ED Medications  ondansetron (ZOFRAN) injection 4 mg (not administered)  fentaNYL (SUBLIMAZE) injection 50 mcg (not administered)  ondansetron (ZOFRAN) injection 4 mg (4 mg Intravenous Given 10/21/16 0716)     Initial Impression / Assessment and Plan / ED Course  I have reviewed the triage vital signs and the nursing notes.  Pertinent labs & imaging results that were available during my care of the patient were reviewed by me and considered in my medical decision making (see chart for details).  MDM Reviewed: previous chart, nursing note and vitals Reviewed previous: labs and ECG Interpretation: labs, x-ray, CT scan and ECG   ED ECG REPORT   Date: 10/21/2016  Rate: 109  Rhythm: sinus tachycardia  QRS Axis: normal  Intervals: normal  ST/T Wave abnormalities: nonspecific ST/T changes  Conduction Disutrbances:none  Narrative Interpretation:   Old EKG Reviewed: unchanged; no significant changes from previous EKG dated 10/19/2016.  Results for orders placed or performed during the hospital encounter of 10/21/16  Lipase, blood  Result Value Ref Range   Lipase 23 11 - 51 U/L  Comprehensive metabolic panel  Result Value Ref Range   Sodium 136  135 - 145 mmol/L   Potassium 4.0 3.5 - 5.1 mmol/L   Chloride 102 101 - 111 mmol/L   CO2 24 22 - 32 mmol/L   Glucose, Bld 121 (H) 65 - 99 mg/dL   BUN 12 6 - 20 mg/dL   Creatinine, Ser 0.99 0.44 - 1.00 mg/dL   Calcium 8.8 (L) 8.9 - 10.3 mg/dL   Total Protein 7.9 6.5 - 8.1 g/dL   Albumin 4.0 3.5 - 5.0 g/dL   AST 143 (H) 15 - 41 U/L   ALT 169 (H) 14 - 54 U/L   Alkaline Phosphatase 127 (H) 38 - 126 U/L   Total Bilirubin 1.0 0.3 - 1.2 mg/dL   GFR calc non Af Amer 59 (L) >60 mL/min   GFR calc Af Amer >60 >60 mL/min   Anion gap 10 5 - 15  CBC  Result Value Ref Range   WBC 11.2 (H) 4.0 - 10.5 K/uL   RBC 4.89 3.87 - 5.11 MIL/uL   Hemoglobin 14.4 12.0 - 15.0 g/dL   HCT 42.1 36.0 - 46.0 %   MCV 86.1 78.0 - 100.0 fL   MCH 29.4 26.0 - 34.0 pg   MCHC 34.2 30.0 - 36.0 g/dL   RDW 14.7 11.5 - 15.5 %   Platelets 188 150 - 400 K/uL  Urinalysis, Routine w reflex microscopic  Result Value Ref Range   Color, Urine AMBER (A) YELLOW   APPearance HAZY (A) CLEAR   Specific Gravity, Urine 1.021 1.005 - 1.030   pH 5.0 5.0 - 8.0   Glucose, UA NEGATIVE NEGATIVE mg/dL   Hgb urine dipstick SMALL (A) NEGATIVE   Bilirubin Urine NEGATIVE NEGATIVE   Ketones, ur NEGATIVE NEGATIVE mg/dL   Protein, ur 100 (A) NEGATIVE  mg/dL   Nitrite NEGATIVE NEGATIVE   Leukocytes, UA NEGATIVE NEGATIVE   RBC / HPF 6-30 0 - 5 RBC/hpf   WBC, UA 0-5 0 - 5 WBC/hpf   Bacteria, UA NONE SEEN NONE SEEN   Squamous Epithelial / LPF 0-5 (A) NONE SEEN   Mucous PRESENT    Ca Oxalate Crys, UA PRESENT   hCG, quantitative, pregnancy  Result Value Ref Range   hCG, Beta Chain, Quant, S 3 <5 mIU/mL  Lactic acid, plasma  Result Value Ref Range   Lactic Acid, Venous 1.2 0.5 - 1.9 mmol/L  Troponin I  Result Value Ref Range   Troponin I <0.03 <0.03 ng/mL  Differential  Result Value Ref Range   Neutrophils Relative % 87 %   Neutro Abs 9.6 (H) 1.7 - 7.7 K/uL   Lymphocytes Relative 8 %   Lymphs Abs 0.9 0.7 - 4.0 K/uL   Monocytes  Relative 5 %   Monocytes Absolute 0.6 0.1 - 1.0 K/uL   Eosinophils Relative 0 %   Eosinophils Absolute 0.0 0.0 - 0.7 K/uL   Basophils Relative 0 %   Basophils Absolute 0.0 0.0 - 0.1 K/uL  I-Stat beta hCG blood, ED  Result Value Ref Range   I-stat hCG, quantitative 21.6 (H) <5 mIU/mL   Comment 3           Dg Chest 2 View Result Date: 10/21/2016 CLINICAL DATA:  Fever, chills, nausea, vomiting and diarrhea. Leg pain 1-2 weeks worsening today. Recent falls and multiple previous back surgeries. Headache. EXAM: CHEST  2 VIEW COMPARISON:  10/19/2016 FINDINGS: Lungs are adequately inflated with patchy linear density over the right midlung and left lung base suggesting atelectasis. No effusion or pneumothorax. Cardiomediastinal silhouette is within normal. Anterior fusion hardware over the lower cervical spine unchanged. Minimal degenerate change of the spine. IMPRESSION: Mild patchy linear density over the right midlung and left base likely atelectasis. Cannot completely exclude early infection. Electronically Signed   By: Marin Olp M.D.   On: 10/21/2016 08:37   Ct Abdomen Pelvis W Contrast Result Date: 10/21/2016 CLINICAL DATA:  Abdominal pain and diarrhea EXAM: CT ABDOMEN AND PELVIS WITH CONTRAST TECHNIQUE: Multidetector CT imaging of the abdomen and pelvis was performed using the standard protocol following bolus administration of intravenous contrast. CONTRAST:  136mL ISOVUE-300 IOPAMIDOL (ISOVUE-300) INJECTION 61% COMPARISON:  11/18/2015 FINDINGS: Lower chest: Mild bibasilar atelectatic changes are noted. No sizable effusion is seen. Hepatobiliary: Fatty infiltration of the liver is noted. The gallbladder is within normal limits. Pancreas: Unremarkable. No pancreatic ductal dilatation or surrounding inflammatory changes. Spleen: Normal in size without focal abnormality. Adrenals/Urinary Tract: Adrenal glands are unremarkable. Kidneys are normal, without renal calculi, focal lesion, or hydronephrosis.  Bladder is unremarkable. Stomach/Bowel: The appendix has been surgically removed. Mild pericolonic inflammatory changes are noted in the ascending colon which may represent focal colitis. The remainder the colon is within normal limits. No obstructive changes are seen. Vascular/Lymphatic: Aortic atherosclerosis. No enlarged abdominal or pelvic lymph nodes. Reproductive: Uterus and bilateral adnexa are unremarkable. Other: No abdominal wall hernia or abnormality. No abdominopelvic ascites. Musculoskeletal: Postsurgical changes are noted from L3-S1. IMPRESSION: Changes of mild colitis in the ascending colon. No perforation or abscess is seen. No other acute abnormality is noted. Electronically Signed   By: Inez Catalina M.D.   On: 10/21/2016 08:34    0930:  LFT's mildly elevated, CT RUQ is reassuring; acute hepatitis panel added as well as RMSF. No stooling while in the ED. IVF  given for orthostasis on VS. IV cipro/flagyl given for colits and possible CAP after BC and UC obtained.  Dx and testing d/w pt and family.  Questions answered.  Verb understanding, agreeable to admit. T/C to [facility] Dr. Karleen Hampshire, case discussed, including:  HPI, pertinent PM/SHx, VS/PE, dx testing, ED course and treatment:  Agreeable to admit.     Final Clinical Impressions(s) / ED Diagnoses   Final diagnoses:  None    New Prescriptions New Prescriptions   No medications on file      Francine Graven, DO 10/22/16 0815

## 2016-10-21 NOTE — Progress Notes (Signed)
Pharmacy Antibiotic Note  [name] is a 66 y.o. female admitted on 10/21/2016 with intra-abdominal infection and pneumonia.  Pharmacy has been consulted for Zosyn dosing.  Plan: Zosyn 3.375g IV q8h (infuse each dose over 4 hours). Monitor renal function, cultures, clinical course.    Height: 5\' 5"  (165.1 cm) Weight: 218 lb (98.9 kg) IBW/kg (Calculated) : 57  Temp (24hrs), Avg:100.4 F (38 C), Min:99.8 F (37.7 C), Max:100.8 F (38.2 C)   Recent Labs Lab 10/19/16 2033 10/19/16 2055 10/21/16 0703 10/21/16 0740 10/21/16 1008  WBC 12.0*  --  11.2*  --   --   CREATININE 0.99  --  0.99  --   --   LATICACIDVEN  --  1.76  --  1.2 2.1*    Estimated Creatinine Clearance: 66 mL/min (by C-G formula based on SCr of 0.99 mg/dL).    Allergies  Allergen Reactions  . Lyrica [Pregabalin] Anaphylaxis  . Nsaids Swelling    swelling  . Tolmetin Swelling  . Prednisone Swelling  . Tape Other (See Comments)    *adhesive* Reaction- blisters   . Captopril   . Lisinopril Swelling  . Caine-1 [Lidocaine Hcl] Palpitations  . Celebrex [Celecoxib] Palpitations  . Codeine Palpitations  . Nephron [Epinephrine] Palpitations    NASAL SPRAY  . Novocain [Procaine] Other (See Comments)    dizziness  . Other Palpitations    Glucocorticoids/Steroids  . Procaine Hcl Palpitations  . Sulfa Antibiotics Palpitations    Antimicrobials this admission: 8/11 >> Cipro x 1 8/11 >> Metronidazole x 1 8/11 >> Zosyn >>  Dose adjustments this admission: --  Microbiology results: 8/11 BCx: sent 8/11 UCx: sent  8/11 GI panel by PCR: sent  Thank you for allowing pharmacy to be a part of this patient's care.   Lindell Spar, PharmD, BCPS Pager: [phone] 10/21/2016 12:34 PM

## 2016-10-21 NOTE — ED Notes (Signed)
Bed: RX45 Expected date:  Expected time:  Means of arrival:  Comments: 66 yr old, back and bilateral leg pain, fever

## 2016-10-22 DIAGNOSIS — Z79899 Other long term (current) drug therapy: Secondary | ICD-10-CM | POA: Diagnosis not present

## 2016-10-22 DIAGNOSIS — I10 Essential (primary) hypertension: Secondary | ICD-10-CM | POA: Diagnosis present

## 2016-10-22 DIAGNOSIS — R197 Diarrhea, unspecified: Secondary | ICD-10-CM | POA: Diagnosis not present

## 2016-10-22 DIAGNOSIS — R110 Nausea: Secondary | ICD-10-CM | POA: Diagnosis not present

## 2016-10-22 DIAGNOSIS — R740 Nonspecific elevation of levels of transaminase and lactic acid dehydrogenase [LDH]: Secondary | ICD-10-CM | POA: Diagnosis not present

## 2016-10-22 DIAGNOSIS — R509 Fever, unspecified: Secondary | ICD-10-CM | POA: Diagnosis not present

## 2016-10-22 DIAGNOSIS — A419 Sepsis, unspecified organism: Secondary | ICD-10-CM | POA: Diagnosis not present

## 2016-10-22 DIAGNOSIS — K529 Noninfective gastroenteritis and colitis, unspecified: Secondary | ICD-10-CM | POA: Diagnosis not present

## 2016-10-22 DIAGNOSIS — E039 Hypothyroidism, unspecified: Secondary | ICD-10-CM | POA: Diagnosis present

## 2016-10-22 DIAGNOSIS — Z833 Family history of diabetes mellitus: Secondary | ICD-10-CM | POA: Diagnosis not present

## 2016-10-22 DIAGNOSIS — G894 Chronic pain syndrome: Secondary | ICD-10-CM | POA: Diagnosis present

## 2016-10-22 DIAGNOSIS — A020 Salmonella enteritis: Secondary | ICD-10-CM | POA: Diagnosis not present

## 2016-10-22 DIAGNOSIS — F418 Other specified anxiety disorders: Secondary | ICD-10-CM | POA: Diagnosis present

## 2016-10-22 DIAGNOSIS — I159 Secondary hypertension, unspecified: Secondary | ICD-10-CM | POA: Diagnosis not present

## 2016-10-22 DIAGNOSIS — Z7982 Long term (current) use of aspirin: Secondary | ICD-10-CM | POA: Diagnosis not present

## 2016-10-22 DIAGNOSIS — Z9181 History of falling: Secondary | ICD-10-CM | POA: Diagnosis not present

## 2016-10-22 DIAGNOSIS — Z7951 Long term (current) use of inhaled steroids: Secondary | ICD-10-CM | POA: Diagnosis not present

## 2016-10-22 DIAGNOSIS — R112 Nausea with vomiting, unspecified: Secondary | ICD-10-CM | POA: Diagnosis not present

## 2016-10-22 DIAGNOSIS — K760 Fatty (change of) liver, not elsewhere classified: Secondary | ICD-10-CM | POA: Diagnosis present

## 2016-10-22 DIAGNOSIS — Z8249 Family history of ischemic heart disease and other diseases of the circulatory system: Secondary | ICD-10-CM | POA: Diagnosis not present

## 2016-10-22 LAB — URINE CULTURE

## 2016-10-22 LAB — GASTROINTESTINAL PANEL BY PCR, STOOL (REPLACES STOOL CULTURE)

## 2016-10-22 LAB — COMPREHENSIVE METABOLIC PANEL
ALT: 92 U/L — ABNORMAL HIGH (ref 14–54)
AST: 58 U/L — ABNORMAL HIGH (ref 15–41)
Albumin: 2.7 g/dL — ABNORMAL LOW (ref 3.5–5.0)
Alkaline Phosphatase: 80 U/L (ref 38–126)
Anion gap: 9 (ref 5–15)
BUN: 16 mg/dL (ref 6–20)
CO2: 22 mmol/L (ref 22–32)
Calcium: 8 mg/dL — ABNORMAL LOW (ref 8.9–10.3)
Chloride: 103 mmol/L (ref 101–111)
Creatinine, Ser: 0.9 mg/dL (ref 0.44–1.00)
GFR calc Af Amer: >60 mL/min (ref >60)
GFR calc non Af Amer: >60 mL/min (ref >60)
Glucose, Bld: 96 mg/dL (ref 65–99)
Potassium: 4.3 mmol/L (ref 3.5–5.1)
Sodium: 134 mmol/L — ABNORMAL LOW (ref 135–145)
Total Bilirubin: 1 mg/dL (ref 0.3–1.2)
Total Protein: 5.9 g/dL — ABNORMAL LOW (ref 6.5–8.1)

## 2016-10-22 LAB — C DIFFICILE QUICK SCREEN W PCR REFLEX
C Diff antigen: NEGATIVE
C Diff interpretation: NOT DETECTED
C Diff toxin: NEGATIVE

## 2016-10-22 LAB — HIV ANTIBODY (ROUTINE TESTING W REFLEX): HIV Screen 4th Generation wRfx: NONREACTIVE

## 2016-10-22 MED ORDER — CIPROFLOXACIN IN D5W 400 MG/200ML IV SOLN
400.0000 mg | Freq: Two times a day (BID) | INTRAVENOUS | Status: DC
  Administered 2016-10-22 – 2016-10-26 (×8): 400 mg via INTRAVENOUS
  Filled 2016-10-22 (×8): qty 200

## 2016-10-22 MED ORDER — CIPROFLOXACIN IN D5W 400 MG/200ML IV SOLN: 400.0000 mg | Freq: Two times a day (BID) | INTRAVENOUS | Status: DC

## 2016-10-22 NOTE — Progress Notes (Signed)
PROGRESS NOTE  Jaina Morin VQQ:[id number] DOB: Feb 16, 1951 DOA: 10/21/2016 PCP: Kelton Pillar, MD   LOS: 0 days   Brief Narrative / Interim history: Keisi Eckford is a 67 y.o. female with medical history significant of hypertension, hypothyroidism, chronic pain syndrome, presents with fever, chills, abdominal pain, nausea,diarrhea , cough for 3 days. On arrival to ED, she was found to be febrile, with tmax of 100.8. CT abd shows colits and CXR shows Mild patchy linear density over the right midlung and left base likely atelectasis. She reports having cough with productive sputum almost for a month and worsened over the last three days. No sick contacts or travel recently. She reports mild generalized abdominal pain with diarrhea for 3 days associated with nausea and no vomiting. She also reports having a fall 2 weeks ago. Denies any pain . She reports chronic back pain for which she takes tramadol. Pt also reports taking tciks off her skin recently. She was referred to medical service for further evaluation of her colits and possible atypical pneumonia.  Assessment & Plan: Active Problems:   Hypertension   Depression with anxiety   Chronic pain   Colitis   CAP (community acquired pneumonia)   Fever chills   Nausea, abdominal pain and diarrhea  -CT scan with evidence of colitis in the ascending colon -Started on Zosyn on admission, continue.  Patient still febrile overnight to 102.8, afebrile this morning -Nausea is improved, will see how she does with a soft diet. -C. difficile, GI pathogen panel are pending -Blood cultures are without growth, pending.  Urine cultures with multiple species present, finalized  ?Atypical pneumonia -I doubt she has true pneumonia as she has minimal to no respiratory symptoms.  Hypertension -Controlled, continue metoprolol  Depression with anxiety -Resume home medications  Hypothyroidism -Resume synthroid -Get TSH.    Elevated liver function tests -In the setting of fatty liver, LFTs improving today, acute hepatitis panel pending.  Gallbladder without acute findings on imaging  H/o tick bites -RMSF work up pending   DVT prophylaxis: Lovenox Code Status: Full code Family Communication: no family at bedside Disposition Plan: home when ready  Consultants:   None   Procedures:   None   Antimicrobials:  Zosyn 10/21/2016 >>  Subjective: -Nausea has improved, continues to have watery bowel movements at least 6 episodes overnight.  No chest pain or shortness of breath.  Complains of a dry cough that has been on and off for quite some time  Objective: Vitals:   10/21/16 1400 10/21/16 2106 10/22/16 0212 10/22/16 0657  BP: (!) 116/57 134/66  (!) 108/52  Pulse: (!) 105 96  80  Resp: 17 18  18   Temp: (!) 103.3 F (39.6 C) (!) 102.8 F (39.3 C) 98.4 F (36.9 C) 98.4 F (36.9 C)  TempSrc: Oral Oral Oral Oral  SpO2: 99% 90%  100%  Weight:      Height:        Intake/Output Summary (Last 24 hours) at 10/22/16 1149 Last data filed at 10/22/16 1000  Gross per 24 hour  Intake             2490 ml  Output              157 ml  Net             2333 ml   Filed Weights   10/21/16 0629  Weight: 98.9 kg (218 lb)    Examination:  Vitals:   10/21/16 1400 10/21/16  2106 10/22/16 0212 10/22/16 0657  BP: (!) 116/57 134/66  (!) 108/52  Pulse: (!) 105 96  80  Resp: 17 18  18   Temp: (!) 103.3 F (39.6 C) (!) 102.8 F (39.3 C) 98.4 F (36.9 C) 98.4 F (36.9 C)  TempSrc: Oral Oral Oral Oral  SpO2: 99% 90%  100%  Weight:      Height:        Constitutional: NAD Eyes: lids and conjunctivae normal ENMT: Mucous membranes are moist. Respiratory: clear to auscultation bilaterally, no wheezing, no crackles. Normal respiratory effort. Cardiovascular: Regular rate and rhythm, no murmurs / rubs / gallops. No LE edema.  Abdomen: Mild tenderness to palpation throughout.  No guarding.  No rebound  tenderness.  Bowel sounds positive.  Musculoskeletal: no clubbing / cyanosis. Neurologic: non focal    Data Reviewed: I have independently reviewed following labs and imaging studies  CBC:  Recent Labs Lab 10/19/16 2033 10/21/16 0703  WBC 12.0* 11.2*  NEUTROABS 9.9* 9.6*  HGB 14.7 14.4  HCT 43.4 42.1  MCV 85.8 86.1  PLT 231 865   Basic Metabolic Panel:  Recent Labs Lab 10/19/16 2033 10/21/16 0703 10/22/16 0524  NA 136 136 134*  K 4.4 4.0 4.3  CL 101 102 103  CO2 25 24 22   GLUCOSE 112* 121* 96  BUN 12 12 16   CREATININE 0.99 0.99 0.90  CALCIUM 9.4 8.8* 8.0*   GFR: Estimated Creatinine Clearance: 72.6 mL/min (by C-G formula based on SCr of 0.9 mg/dL). Liver Function Tests:  Recent Labs Lab 10/19/16 2033 10/21/16 0703 10/22/16 0524  AST 64* 143* 58*  ALT 75* 169* 92*  ALKPHOS 114 127* 80  BILITOT 0.9 1.0 1.0  PROT 7.7 7.9 5.9*  ALBUMIN 4.3 4.0 2.7*    Recent Labs Lab 10/21/16 0703  LIPASE 23   No results for input(s): AMMONIA in the last 168 hours. Coagulation Profile: No results for input(s): INR, PROTIME in the last 168 hours. Cardiac Enzymes:  Recent Labs Lab 10/21/16 0755  TROPONINI <0.03   BNP (last 3 results)  Recent Labs  07/25/16 0958  PROBNP 119   HbA1C: No results for input(s): HGBA1C in the last 72 hours. CBG: No results for input(s): GLUCAP in the last 168 hours. Lipid Profile: No results for input(s): CHOL, HDL, LDLCALC, TRIG, CHOLHDL, LDLDIRECT in the last 72 hours. Thyroid Function Tests: No results for input(s): TSH, T4TOTAL, FREET4, T3FREE, THYROIDAB in the last 72 hours. Anemia Panel: No results for input(s): VITAMINB12, FOLATE, FERRITIN, TIBC, IRON, RETICCTPCT in the last 72 hours. Urine analysis:    Component Value Date/Time   COLORURINE AMBER (A) 10/21/2016 0703   APPEARANCEUR HAZY (A) 10/21/2016 0703   LABSPEC 1.021 10/21/2016 0703   PHURINE 5.0 10/21/2016 0703   GLUCOSEU NEGATIVE 10/21/2016 0703   GLUCOSEU  NEGATIVE 07/25/2010 1555   HGBUR SMALL (A) 10/21/2016 0703   BILIRUBINUR NEGATIVE 10/21/2016 0703   BILIRUBINUR negative 04/12/2016 1158   BILIRUBINUR neg 04/15/2014 0941   KETONESUR NEGATIVE 10/21/2016 0703   PROTEINUR 100 (A) 10/21/2016 0703   UROBILINOGEN 0.2 04/12/2016 1158   UROBILINOGEN 0.2 10/25/2014 1204   NITRITE NEGATIVE 10/21/2016 0703   LEUKOCYTESUR NEGATIVE 10/21/2016 0703   Sepsis Labs: Invalid input(s): PROCALCITONIN, LACTICIDVEN  Recent Results (from the past 240 hour(s))  Urine culture     Status: Abnormal   Collection Time: 10/21/16  7:42 AM  Result Value Ref Range Status   Specimen Description URINE, CLEAN CATCH  Final   Special Requests  NONE  Final   Culture MULTIPLE SPECIES PRESENT, SUGGEST RECOLLECTION (A)  Final   Report Status 10/22/2016 FINAL  Final      Radiology Studies: Dg Chest 2 View  Result Date: 10/21/2016 CLINICAL DATA:  Fever, chills, nausea, vomiting and diarrhea. Leg pain 1-2 weeks worsening today. Recent falls and multiple previous back surgeries. Headache. EXAM: CHEST  2 VIEW COMPARISON:  10/19/2016 FINDINGS: Lungs are adequately inflated with patchy linear density over the right midlung and left lung base suggesting atelectasis. No effusion or pneumothorax. Cardiomediastinal silhouette is within normal. Anterior fusion hardware over the lower cervical spine unchanged. Minimal degenerate change of the spine. IMPRESSION: Mild patchy linear density over the right midlung and left base likely atelectasis. Cannot completely exclude early infection. Electronically Signed   By: Marin Olp M.D.   On: 10/21/2016 08:37   Ct Abdomen Pelvis W Contrast  Result Date: 10/21/2016 CLINICAL DATA:  Abdominal pain and diarrhea EXAM: CT ABDOMEN AND PELVIS WITH CONTRAST TECHNIQUE: Multidetector CT imaging of the abdomen and pelvis was performed using the standard protocol following bolus administration of intravenous contrast. CONTRAST:  151mL ISOVUE-300 IOPAMIDOL  (ISOVUE-300) INJECTION 61% COMPARISON:  11/18/2015 FINDINGS: Lower chest: Mild bibasilar atelectatic changes are noted. No sizable effusion is seen. Hepatobiliary: Fatty infiltration of the liver is noted. The gallbladder is within normal limits. Pancreas: Unremarkable. No pancreatic ductal dilatation or surrounding inflammatory changes. Spleen: Normal in size without focal abnormality. Adrenals/Urinary Tract: Adrenal glands are unremarkable. Kidneys are normal, without renal calculi, focal lesion, or hydronephrosis. Bladder is unremarkable. Stomach/Bowel: The appendix has been surgically removed. Mild pericolonic inflammatory changes are noted in the ascending colon which may represent focal colitis. The remainder the colon is within normal limits. No obstructive changes are seen. Vascular/Lymphatic: Aortic atherosclerosis. No enlarged abdominal or pelvic lymph nodes. Reproductive: Uterus and bilateral adnexa are unremarkable. Other: No abdominal wall hernia or abnormality. No abdominopelvic ascites. Musculoskeletal: Postsurgical changes are noted from L3-S1. IMPRESSION: Changes of mild colitis in the ascending colon. No perforation or abscess is seen. No other acute abnormality is noted. Electronically Signed   By: Inez Catalina M.D.   On: 10/21/2016 08:34     Scheduled Meds: . amitriptyline  20 mg Oral QHS  . aspirin EC  81 mg Oral QODAY  . enoxaparin (LOVENOX) injection  40 mg Subcutaneous Q24H  . feeding supplement  1 Container Oral TID BM  . fluticasone  2 spray Each Nare Daily  . levothyroxine  75 mcg Oral QAC breakfast  . metoprolol succinate  25 mg Oral Daily  . pantoprazole  40 mg Oral Daily  . sucralfate  1 g Oral Daily   Continuous Infusions: . sodium chloride 100 mL/hr at 10/22/16 0600  . piperacillin-tazobactam (ZOSYN)  IV Stopped (10/22/16 3546)     Marzetta Board, MD, PhD [facility] Pager [phone]  If 7PM-7AM, please contact night-coverage [email or web address] Password  [facility] 10/22/2016, 11:49 AM

## 2016-10-23 DIAGNOSIS — A020 Salmonella enteritis: Secondary | ICD-10-CM

## 2016-10-23 DIAGNOSIS — A419 Sepsis, unspecified organism: Secondary | ICD-10-CM

## 2016-10-23 HISTORY — DX: Sepsis, unspecified organism: A41.9

## 2016-10-23 HISTORY — DX: Salmonella enteritis: A02.0

## 2016-10-23 LAB — CBC
HCT: 35 % — ABNORMAL LOW (ref 36.0–46.0)
Hemoglobin: 12 g/dL (ref 12.0–15.0)
MCH: 29.3 pg (ref 26.0–34.0)
MCHC: 34.3 g/dL (ref 30.0–36.0)
MCV: 85.6 fL (ref 78.0–100.0)
Platelets: 156 10*3/uL (ref 150–400)
RBC: 4.09 MIL/uL (ref 3.87–5.11)
RDW: 14.9 % (ref 11.5–15.5)
WBC: 5.3 10*3/uL (ref 4.0–10.5)

## 2016-10-23 LAB — BASIC METABOLIC PANEL
Anion gap: 8 (ref 5–15)
BUN: 13 mg/dL (ref 6–20)
CO2: 21 mmol/L — ABNORMAL LOW (ref 22–32)
Calcium: 8 mg/dL — ABNORMAL LOW (ref 8.9–10.3)
Chloride: 110 mmol/L (ref 101–111)
Creatinine, Ser: 1.1 mg/dL — ABNORMAL HIGH (ref 0.44–1.00)
GFR calc Af Amer: 60 mL/min — ABNORMAL LOW (ref >60)
GFR calc non Af Amer: 52 mL/min — ABNORMAL LOW (ref >60)
Glucose, Bld: 144 mg/dL — ABNORMAL HIGH (ref 65–99)
Potassium: 3.1 mmol/L — ABNORMAL LOW (ref 3.5–5.1)
Sodium: 139 mmol/L (ref 135–145)

## 2016-10-23 LAB — HEPATITIS PANEL, ACUTE
HCV Ab: <0.1 s/co ratio (ref 0.0–0.9)
Hep A IgM: NEGATIVE
Hep B C IgM: NEGATIVE
Hepatitis B Surface Ag: NEGATIVE

## 2016-10-23 LAB — TSH: TSH: 3.794 u[IU]/mL (ref 0.350–4.500)

## 2016-10-23 MED ORDER — SACCHAROMYCES BOULARDII 250 MG PO CAPS
250.0000 mg | ORAL_CAPSULE | Freq: Two times a day (BID) | ORAL | Status: DC
  Administered 2016-10-23 – 2016-10-26 (×7): 250 mg via ORAL
  Filled 2016-10-23 (×7): qty 1

## 2016-10-23 MED ORDER — POTASSIUM CHLORIDE CRYS ER 20 MEQ PO TBCR
40.0000 meq | EXTENDED_RELEASE_TABLET | Freq: Once | ORAL | Status: AC
  Administered 2016-10-23: 40 meq via ORAL
  Filled 2016-10-23: qty 2

## 2016-10-23 NOTE — Progress Notes (Signed)
PROGRESS NOTE  [name] SWH:[id number] DOB: 03-26-1950 DOA: 10/21/2016 PCP: Kelton Pillar, MD   LOS: 1 day   Brief Narrative / Interim history: [name] is a 66 y.o. female with medical history significant of hypertension, hypothyroidism, chronic pain syndrome, presents with fever, chills, abdominal pain, nausea,diarrhea , cough for 3 days. On arrival to ED, she was found to be febrile, with tmax of 100.8. CT abd shows colits and CXR shows Mild patchy linear density over the right midlung and left base likely atelectasis. She reports having cough with productive sputum almost for a month and worsened over the last three days. No sick contacts or travel recently. She reports mild generalized abdominal pain with diarrhea for 3 days associated with nausea and no vomiting. She also reports having a fall 2 weeks ago. Denies any pain . She reports chronic back pain for which she takes tramadol. Pt also reports taking tciks off her skin recently. She was referred to medical service for further evaluation of her colits and possible atypical pneumonia.  Assessment & Plan: Active Problems:   Hypertension   Depression with anxiety   Chronic pain   Colitis   CAP (community acquired pneumonia)   Fever chills   Sepsis ([place])   Salmonella enteritis   Sepsis due to Salmonella colitis with nausea, abdominal pain and diarrhea  -CT scan with evidence of colitis in the ascending colon -Started on Zosyn on admission, her antibiotics were switched to ciprofloxacin and GI pathogen panel showed presence of Salmonella -Febrile to 102.8 on admission, has been afebrile for the past 24 hours -She has ongoing diarrhea today, to IV fluids -C. difficile was negative -Blood cultures are without growth, pending  ?Atypical pneumonia -I doubt she has true pneumonia as she has minimal to no respiratory symptoms.  We will keep him on Cipro for #1  Hypertension -Controlled, 136/60 this  morning, continue metoprolol  Depression with anxiety -continue home medications  Hypothyroidism -Resume synthroid -TSH normal at 3.7  Elevated liver function tests -In the setting of fatty liver, LFTs improving today, acute hepatitis panel pending.  Gallbladder without acute findings on imaging  H/o tick bites -RMSF work up pending   DVT prophylaxis: Lovenox Code Status: Full code Family Communication: no family at bedside Disposition Plan: home when ready  Consultants:   None   Procedures:   None   Antimicrobials:  Zosyn 10/21/2016 >>  Subjective: -Ongoing diarrhea.  She has poor appetite.  No chest pain shortness of breath. No vomiting.  Objective: Vitals:   10/22/16 0657 10/22/16 1400 10/22/16 2159 10/23/16 0552  BP: (!) 108/52 (!) 107/52 109/75 138/60  Pulse: 80 67 72 76  Resp: 18 18 18 18   Temp: 98.4 F (36.9 C) 98.2 F (36.8 C) 98.2 F (36.8 C) 98.2 F (36.8 C)  TempSrc: Oral Oral Oral Oral  SpO2: 100% 97% 98% 98%  Weight:      Height:        Intake/Output Summary (Last 24 hours) at 10/23/16 1215 Last data filed at 10/23/16 0742  Gross per 24 hour  Intake             3150 ml  Output                0 ml  Net             3150 ml   Filed Weights   10/21/16 0629  Weight: 98.9 kg (218 lb)    Examination:  Vitals:  10/22/16 0657 10/22/16 1400 10/22/16 2159 10/23/16 0552  BP: (!) 108/52 (!) 107/52 109/75 138/60  Pulse: 80 67 72 76  Resp: 18 18 18 18   Temp: 98.4 F (36.9 C) 98.2 F (36.8 C) 98.2 F (36.8 C) 98.2 F (36.8 C)  TempSrc: Oral Oral Oral Oral  SpO2: 100% 97% 98% 98%  Weight:      Height:        Constitutional: NAD Eyes: no scleral icterus ENMT: MMM Respiratory: CTA biL, no wheezing  Cardiovascular: RRR, no murmurs. No LE edema Abdomen: Mild tenderness to palpation throughout.  No guarding.  No rebound tenderness, bowel sounds positive  Neurologic: no focal findings   Data Reviewed: I have independently reviewed  following labs and imaging studies  CBC:  Recent Labs Lab 10/19/16 2033 10/21/16 0703 10/23/16 0528  WBC 12.0* 11.2* 5.3  NEUTROABS 9.9* 9.6*  --   HGB 14.7 14.4 12.0  HCT 43.4 42.1 35.0*  MCV 85.8 86.1 85.6  PLT 231 188 235   Basic Metabolic Panel:  Recent Labs Lab 10/19/16 2033 10/21/16 0703 10/22/16 0524 10/23/16 0528  NA 136 136 134* 139  K 4.4 4.0 4.3 3.1*  CL 101 102 103 110  CO2 25 24 22  21*  GLUCOSE 112* 121* 96 144*  BUN 12 12 16 13   CREATININE 0.99 0.99 0.90 1.10*  CALCIUM 9.4 8.8* 8.0* 8.0*   GFR: Estimated Creatinine Clearance: 59.4 mL/min (A) (by C-G formula based on SCr of 1.1 mg/dL (H)). Liver Function Tests:  Recent Labs Lab 10/19/16 2033 10/21/16 0703 10/22/16 0524  AST 64* 143* 58*  ALT 75* 169* 92*  ALKPHOS 114 127* 80  BILITOT 0.9 1.0 1.0  PROT 7.7 7.9 5.9*  ALBUMIN 4.3 4.0 2.7*    Recent Labs Lab 10/21/16 0703  LIPASE 23   No results for input(s): AMMONIA in the last 168 hours. Coagulation Profile: No results for input(s): INR, PROTIME in the last 168 hours. Cardiac Enzymes:  Recent Labs Lab 10/21/16 0755  TROPONINI <0.03   BNP (last 3 results)  Recent Labs  07/25/16 0958  PROBNP 119   HbA1C: No results for input(s): HGBA1C in the last 72 hours. CBG: No results for input(s): GLUCAP in the last 168 hours. Lipid Profile: No results for input(s): CHOL, HDL, LDLCALC, TRIG, CHOLHDL, LDLDIRECT in the last 72 hours. Thyroid Function Tests:  Recent Labs  10/23/16 0528  TSH 3.794   Anemia Panel: No results for input(s): VITAMINB12, FOLATE, FERRITIN, TIBC, IRON, RETICCTPCT in the last 72 hours. Urine analysis:    Component Value Date/Time   COLORURINE AMBER (A) 10/21/2016 0703   APPEARANCEUR HAZY (A) 10/21/2016 0703   LABSPEC 1.021 10/21/2016 0703   PHURINE 5.0 10/21/2016 0703   GLUCOSEU NEGATIVE 10/21/2016 0703   GLUCOSEU NEGATIVE 07/25/2010 1555   HGBUR SMALL (A) 10/21/2016 0703   BILIRUBINUR NEGATIVE  10/21/2016 0703   BILIRUBINUR negative 04/12/2016 1158   BILIRUBINUR neg 04/15/2014 0941   KETONESUR NEGATIVE 10/21/2016 0703   PROTEINUR 100 (A) 10/21/2016 0703   UROBILINOGEN 0.2 04/12/2016 1158   UROBILINOGEN 0.2 10/25/2014 1204   NITRITE NEGATIVE 10/21/2016 0703   LEUKOCYTESUR NEGATIVE 10/21/2016 0703   Sepsis Labs: Invalid input(s): PROCALCITONIN, LACTICIDVEN  Recent Results (from the past 240 hour(s))  Urine culture     Status: Abnormal   Collection Time: 10/21/16  7:42 AM  Result Value Ref Range Status   Specimen Description URINE, CLEAN CATCH  Final   Special Requests NONE  Final  Culture MULTIPLE SPECIES PRESENT, SUGGEST RECOLLECTION (A)  Final   Report Status 10/22/2016 FINAL  Final  Culture, blood (routine x 2)     Status: None (Preliminary result)   Collection Time: 10/21/16  8:43 AM  Result Value Ref Range Status   Specimen Description BLOOD RIGHT ANTECUBITAL  Final   Special Requests IN PEDIATRIC BOTTLE Blood Culture adequate volume  Final   Culture   Final    NO GROWTH < 24 HOURS Performed at [facility], [place] [address], [place], [place] [address]    Report Status PENDING  Incomplete  Culture, blood (routine x 2)     Status: None (Preliminary result)   Collection Time: 10/21/16  8:48 AM  Result Value Ref Range Status   Specimen Description BLOOD LEFT HAND  Final   Special Requests IN PEDIATRIC BOTTLE Blood Culture adequate volume  Final   Culture   Final    NO GROWTH < 24 HOURS Performed at [facility], [place] [address] [place], [place] [address]    Report Status PENDING  Incomplete  Gastrointestinal Panel by PCR , Stool     Status: Abnormal   Collection Time: 10/21/16 11:16 AM  Result Value Ref Range Status   Campylobacter species NOT DETECTED NOT DETECTED Final   Plesimonas shigelloides NOT DETECTED NOT DETECTED Final   Salmonella species DETECTED (A) NOT DETECTED Final    Comment: RESULT CALLED TO, READ BACK BY AND VERIFIED WITH: CHRISTY  BLUM ON 10/22/16 AT 1650 [place]    Yersinia enterocolitica NOT DETECTED NOT DETECTED Final   Vibrio species NOT DETECTED NOT DETECTED Final   Vibrio cholerae NOT DETECTED NOT DETECTED Final   Enteroaggregative E coli (EAEC) NOT DETECTED NOT DETECTED Final   Enteropathogenic E coli (EPEC) NOT DETECTED NOT DETECTED Final   Enterotoxigenic E coli (ETEC) NOT DETECTED NOT DETECTED Final   Shiga like toxin producing E coli (STEC) NOT DETECTED NOT DETECTED Final   Shigella/Enteroinvasive E coli (EIEC) NOT DETECTED NOT DETECTED Final   Cryptosporidium NOT DETECTED NOT DETECTED Final   Cyclospora cayetanensis NOT DETECTED NOT DETECTED Final   Entamoeba histolytica NOT DETECTED NOT DETECTED Final   Giardia lamblia NOT DETECTED NOT DETECTED Final   Adenovirus F40/41 NOT DETECTED NOT DETECTED Final   Astrovirus NOT DETECTED NOT DETECTED Final   Norovirus GI/GII NOT DETECTED NOT DETECTED Final   Rotavirus A NOT DETECTED NOT DETECTED Final   Sapovirus (I, II, IV, and V) NOT DETECTED NOT DETECTED Final  C difficile quick scan w PCR reflex     Status: None   Collection Time: 10/22/16 10:30 AM  Result Value Ref Range Status   C Diff antigen NEGATIVE NEGATIVE Final   C Diff toxin NEGATIVE NEGATIVE Final   C Diff interpretation No C. difficile detected.  Final      Radiology Studies: No results found.   Scheduled Meds: . amitriptyline  20 mg Oral QHS  . aspirin EC  81 mg Oral QODAY  . enoxaparin (LOVENOX) injection  40 mg Subcutaneous Q24H  . feeding supplement  1 Container Oral TID BM  . fluticasone  2 spray Each Nare Daily  . levothyroxine  75 mcg Oral QAC breakfast  . metoprolol succinate  25 mg Oral Daily  . pantoprazole  40 mg Oral Daily  . saccharomyces boulardii  250 mg Oral BID  . sucralfate  1 g Oral Daily   Continuous Infusions: . sodium chloride 100 mL/hr at 10/23/16 0107  .  ciprofloxacin 400 mg (10/23/16 0530)     Marzetta Board, MD, PhD [facility] Pager [phone]  [phone]  If 7PM-7AM, please contact night-coverage [email or web address] Password [facility] 10/23/2016, 12:15 PM

## 2016-10-23 NOTE — Progress Notes (Signed)
Nutrition Brief Note  Patient identified on the Malnutrition Screening Tool (MST) Report  Patient's weight trending up. Has ordered [place] ice, boiled egg, toast and tea for breakfast this morning. Receiving [employer].   Wt Readings from Last 15 Encounters:  10/21/16 218 lb (98.9 kg)  10/19/16 200 lb (90.7 kg)  07/25/16 220 lb 9.6 oz (100.1 kg)  03/22/16 213 lb 8 oz (96.8 kg)  12/21/15 210 lb 3.2 oz (95.3 kg)  12/02/15 211 lb (95.7 kg)  08/03/15 209 lb (94.8 kg)  05/10/15 212 lb (96.2 kg)  04/05/15 221 lb (100.2 kg)  01/13/15 220 lb 3.2 oz (99.9 kg)  10/15/14 190 lb (86.2 kg)  10/15/14 210 lb (95.3 kg)  04/15/14 200 lb 12.8 oz (91.1 kg)  03/30/14 200 lb 3.2 oz (90.8 kg)  03/29/14 180 lb (81.6 kg)    Body mass index is 36.28 kg/m. Patient meets criteria for morbid obesity based on current BMI.   Current diet order is soft.  Labs and medications reviewed.   No nutrition interventions warranted at this time. If nutrition issues arise, please consult RD.   Clayton Bibles, MS, RD, LDN Pager: [phone] After Hours Pager: [phone]

## 2016-10-24 LAB — ROCKY MTN SPOTTED FVR ABS PNL(IGG+IGM)
RMSF IgG: NEGATIVE
RMSF IgM: 0.36 index (ref 0.00–0.89)

## 2016-10-24 LAB — BASIC METABOLIC PANEL
Anion gap: 8 (ref 5–15)
BUN: 8 mg/dL (ref 6–20)
CO2: 22 mmol/L (ref 22–32)
Calcium: 8.2 mg/dL — ABNORMAL LOW (ref 8.9–10.3)
Chloride: 108 mmol/L (ref 101–111)
Creatinine, Ser: 0.8 mg/dL (ref 0.44–1.00)
GFR calc Af Amer: >60 mL/min (ref >60)
GFR calc non Af Amer: >60 mL/min (ref >60)
Glucose, Bld: 110 mg/dL — ABNORMAL HIGH (ref 65–99)
Potassium: 3.3 mmol/L — ABNORMAL LOW (ref 3.5–5.1)
Sodium: 138 mmol/L (ref 135–145)

## 2016-10-24 MED ORDER — POTASSIUM CHLORIDE CRYS ER 20 MEQ PO TBCR
40.0000 meq | EXTENDED_RELEASE_TABLET | Freq: Once | ORAL | Status: AC
  Administered 2016-10-24: 40 meq via ORAL
  Filled 2016-10-24: qty 2

## 2016-10-24 NOTE — Progress Notes (Addendum)
PROGRESS NOTE  [name] ZTI:[id number] DOB: 1950/12/04 DOA: 10/21/2016 PCP: Kelton Pillar, MD   LOS: 2 days   Brief Narrative / Interim history: [name] is a 66 y.o. female with medical history significant of hypertension, hypothyroidism, chronic pain syndrome, presents with fever, chills, abdominal pain, nausea,diarrhea , cough for 3 days. On arrival to ED, she was found to be febrile, with tmax of 100.8. CT abd shows colits and CXR shows Mild patchy linear density over the right midlung and left base likely atelectasis. She reports having cough with productive sputum almost for a month and worsened over the last three days. No sick contacts or travel recently. She reports mild generalized abdominal pain with diarrhea for 3 days associated with nausea and no vomiting. She also reports having a fall 2 weeks ago. Denies any pain . She reports chronic back pain for which she takes tramadol. Pt also reports taking tciks off her skin recently. She was referred to medical service for further evaluation of her colits and possible atypical pneumonia.  Stool studies have revealed positive for Salmonella and her antibiotics were narrowed to ciprofloxacin  Assessment & Plan: Active Problems:   Hypertension   Depression with anxiety   Chronic pain   Colitis   Fever chills   Sepsis ([place])   Salmonella enteritis   Sepsis due to Salmonella colitis with nausea, abdominal pain and diarrhea  -CT scan with evidence of colitis in the ascending colon -Started on Zosyn on admission, her antibiotics were switched to ciprofloxacin and GI pathogen panel showed presence of Salmonella -Febrile to 102.8 on admission, has been afebrile since then -She has ongoing diarrhea today, more than 15 episodes since yesterday, continue IV fluids -C. difficile was negative -Blood cultures are without growth, pending  ?Atypical pneumonia -I doubt she has true pneumonia as she has minimal to no  respiratory symptoms.  On antibiotics only for #1  Hypertension -Controlled, 136/60 this morning, continue metoprolol  Depression with anxiety -continue home medications  Hypothyroidism -Resume synthroid -TSH normal at 3.7  Elevated liver function tests -In the setting of fatty liver, LFTs improving, acute hepatitis panel negative.  Gallbladder without acute findings on imaging  H/o tick bites -RMSF work up negative   DVT prophylaxis: Lovenox Code Status: Full code Family Communication: no family at bedside Disposition Plan: home when ready  Consultants:   None   Procedures:   None   Antimicrobials:  Zosyn 10/21/2016 >>  Subjective: -Complains of ongoing diarrhea, 15 episodes yesterday and at least 5 overnight and this morning.  Poor appetite, poor p.o. intake  Objective: Vitals:   10/23/16 0552 10/23/16 1519 10/23/16 2024 10/24/16 0519  BP: 138/60 (!) 148/77 (!) 170/78 130/81  Pulse: 76 78 82 81  Resp: 18 18 19 18   Temp: 98.2 F (36.8 C) 99.5 F (37.5 C) 97.9 F (36.6 C) 98.6 F (37 C)  TempSrc: Oral Oral Oral Oral  SpO2: 98% 96% 97% 94%  Weight:      Height:        Intake/Output Summary (Last 24 hours) at 10/24/16 1315 Last data filed at 10/24/16 0634  Gross per 24 hour  Intake          2928.34 ml  Output              200 ml  Net          2728.34 ml   Filed Weights   10/21/16 0629  Weight: 98.9 kg (218 lb)  Examination:  Vitals:   10/23/16 0552 10/23/16 1519 10/23/16 2024 10/24/16 0519  BP: 138/60 (!) 148/77 (!) 170/78 130/81  Pulse: 76 78 82 81  Resp: 18 18 19 18   Temp: 98.2 F (36.8 C) 99.5 F (37.5 C) 97.9 F (36.6 C) 98.6 F (37 C)  TempSrc: Oral Oral Oral Oral  SpO2: 98% 96% 97% 94%  Weight:      Height:        Constitutional: NAD Respiratory: CTA Cardiovascular: RRR Abdomen: minimal tenderness throughout  Data Reviewed: I have independently reviewed following labs and imaging studies  CBC:  Recent Labs Lab  10/19/16 2033 10/21/16 0703 10/23/16 0528  WBC 12.0* 11.2* 5.3  NEUTROABS 9.9* 9.6*  --   HGB 14.7 14.4 12.0  HCT 43.4 42.1 35.0*  MCV 85.8 86.1 85.6  PLT 231 188 932   Basic Metabolic Panel:  Recent Labs Lab 10/19/16 2033 10/21/16 0703 10/22/16 0524 10/23/16 0528 10/24/16 0548  NA 136 136 134* 139 138  K 4.4 4.0 4.3 3.1* 3.3*  CL 101 102 103 110 108  CO2 25 24 22  21* 22  GLUCOSE 112* 121* 96 144* 110*  BUN 12 12 16 13 8   CREATININE 0.99 0.99 0.90 1.10* 0.80  CALCIUM 9.4 8.8* 8.0* 8.0* 8.2*   GFR: Estimated Creatinine Clearance: 81.7 mL/min (by C-G formula based on SCr of 0.8 mg/dL). Liver Function Tests:  Recent Labs Lab 10/19/16 2033 10/21/16 0703 10/22/16 0524  AST 64* 143* 58*  ALT 75* 169* 92*  ALKPHOS 114 127* 80  BILITOT 0.9 1.0 1.0  PROT 7.7 7.9 5.9*  ALBUMIN 4.3 4.0 2.7*    Recent Labs Lab 10/21/16 0703  LIPASE 23   No results for input(s): AMMONIA in the last 168 hours. Coagulation Profile: No results for input(s): INR, PROTIME in the last 168 hours. Cardiac Enzymes:  Recent Labs Lab 10/21/16 0755  TROPONINI <0.03   BNP (last 3 results)  Recent Labs  07/25/16 0958  PROBNP 119   HbA1C: No results for input(s): HGBA1C in the last 72 hours. CBG: No results for input(s): GLUCAP in the last 168 hours. Lipid Profile: No results for input(s): CHOL, HDL, LDLCALC, TRIG, CHOLHDL, LDLDIRECT in the last 72 hours. Thyroid Function Tests:  Recent Labs  10/23/16 0528  TSH 3.794   Anemia Panel: No results for input(s): VITAMINB12, FOLATE, FERRITIN, TIBC, IRON, RETICCTPCT in the last 72 hours. Urine analysis:    Component Value Date/Time   COLORURINE AMBER (A) 10/21/2016 0703   APPEARANCEUR HAZY (A) 10/21/2016 0703   LABSPEC 1.021 10/21/2016 0703   PHURINE 5.0 10/21/2016 0703   GLUCOSEU NEGATIVE 10/21/2016 0703   GLUCOSEU NEGATIVE 07/25/2010 1555   HGBUR SMALL (A) 10/21/2016 0703   BILIRUBINUR NEGATIVE 10/21/2016 0703    BILIRUBINUR negative 04/12/2016 1158   BILIRUBINUR neg 04/15/2014 0941   KETONESUR NEGATIVE 10/21/2016 0703   PROTEINUR 100 (A) 10/21/2016 0703   UROBILINOGEN 0.2 04/12/2016 1158   UROBILINOGEN 0.2 10/25/2014 1204   NITRITE NEGATIVE 10/21/2016 0703   LEUKOCYTESUR NEGATIVE 10/21/2016 0703   Sepsis Labs: Invalid input(s): PROCALCITONIN, LACTICIDVEN  Recent Results (from the past 240 hour(s))  Urine culture     Status: Abnormal   Collection Time: 10/21/16  7:42 AM  Result Value Ref Range Status   Specimen Description URINE, CLEAN CATCH  Final   Special Requests NONE  Final   Culture MULTIPLE SPECIES PRESENT, SUGGEST RECOLLECTION (A)  Final   Report Status 10/22/2016 FINAL  Final  Culture, blood (  routine x 2)     Status: None (Preliminary result)   Collection Time: 10/21/16  8:43 AM  Result Value Ref Range Status   Specimen Description BLOOD RIGHT ANTECUBITAL  Final   Special Requests IN PEDIATRIC BOTTLE Blood Culture adequate volume  Final   Culture   Final    NO GROWTH 3 DAYS Performed at [facility], [place] [address], [place], [place] [address]    Report Status PENDING  Incomplete  Culture, blood (routine x 2)     Status: None (Preliminary result)   Collection Time: 10/21/16  8:48 AM  Result Value Ref Range Status   Specimen Description BLOOD LEFT HAND  Final   Special Requests IN PEDIATRIC BOTTLE Blood Culture adequate volume  Final   Culture   Final    NO GROWTH 3 DAYS Performed at [facility], [place] [address] [place], [place] [address]    Report Status PENDING  Incomplete  Gastrointestinal Panel by PCR , Stool     Status: Abnormal   Collection Time: 10/21/16 11:16 AM  Result Value Ref Range Status   Campylobacter species NOT DETECTED NOT DETECTED Final   Plesimonas shigelloides NOT DETECTED NOT DETECTED Final   Salmonella species DETECTED (A) NOT DETECTED Final    Comment: RESULT CALLED TO, READ BACK BY AND VERIFIED WITH: CHRISTY BLUM ON 10/22/16 AT 1650  [place]    Yersinia enterocolitica NOT DETECTED NOT DETECTED Final   Vibrio species NOT DETECTED NOT DETECTED Final   Vibrio cholerae NOT DETECTED NOT DETECTED Final   Enteroaggregative E coli (EAEC) NOT DETECTED NOT DETECTED Final   Enteropathogenic E coli (EPEC) NOT DETECTED NOT DETECTED Final   Enterotoxigenic E coli (ETEC) NOT DETECTED NOT DETECTED Final   Shiga like toxin producing E coli (STEC) NOT DETECTED NOT DETECTED Final   Shigella/Enteroinvasive E coli (EIEC) NOT DETECTED NOT DETECTED Final   Cryptosporidium NOT DETECTED NOT DETECTED Final   Cyclospora cayetanensis NOT DETECTED NOT DETECTED Final   Entamoeba histolytica NOT DETECTED NOT DETECTED Final   Giardia lamblia NOT DETECTED NOT DETECTED Final   Adenovirus F40/41 NOT DETECTED NOT DETECTED Final   Astrovirus NOT DETECTED NOT DETECTED Final   Norovirus GI/GII NOT DETECTED NOT DETECTED Final   Rotavirus A NOT DETECTED NOT DETECTED Final   Sapovirus (I, II, IV, and V) NOT DETECTED NOT DETECTED Final  C difficile quick scan w PCR reflex     Status: None   Collection Time: 10/22/16 10:30 AM  Result Value Ref Range Status   C Diff antigen NEGATIVE NEGATIVE Final   C Diff toxin NEGATIVE NEGATIVE Final   C Diff interpretation No C. difficile detected.  Final      Radiology Studies: No results found.   Scheduled Meds: . amitriptyline  20 mg Oral QHS  . aspirin EC  81 mg Oral QODAY  . enoxaparin (LOVENOX) injection  40 mg Subcutaneous Q24H  . feeding supplement  1 Container Oral TID BM  . fluticasone  2 spray Each Nare Daily  . levothyroxine  75 mcg Oral QAC breakfast  . metoprolol succinate  25 mg Oral Daily  . pantoprazole  40 mg Oral Daily  . saccharomyces boulardii  250 mg Oral BID  . sucralfate  1 g Oral Daily   Continuous Infusions: . sodium chloride 100 mL/hr (10/24/16 1018)  . ciprofloxacin Stopped (10/24/16 9150)     Marzetta Board, MD, PhD [facility] Pager [phone]  If 7PM-7AM, please  contact  night-coverage [email or web address] Password [email or web address] 10/24/2016, 1:15 PM

## 2016-10-25 ENCOUNTER — Ambulatory Visit: Payer: Medicare Other | Admitting: Physical Therapy

## 2016-10-25 DIAGNOSIS — A020 Salmonella enteritis: Secondary | ICD-10-CM

## 2016-10-25 MED ORDER — PHENOL 1.4 % MT LIQD
1.0000 | OROMUCOSAL | Status: DC | PRN
  Filled 2016-10-25: qty 177

## 2016-10-25 NOTE — Progress Notes (Signed)
PROGRESS NOTE  [name] PZW:[id number] DOB: 1950/03/16 DOA: 10/21/2016 PCP: Kelton Pillar, MD   LOS: 3 days   Brief Narrative / Interim history: [name] is a 66 y.o. female with medical history significant of hypertension, hypothyroidism, chronic pain syndrome, presents with fever, chills, abdominal pain, nausea,diarrhea , cough for 3 days. On arrival to ED, she was found to be febrile, with tmax of 100.8. CT abd shows colits and CXR shows Mild patchy linear density over the right midlung and left base likely atelectasis. She reports having cough with productive sputum almost for a month and worsened over the last three days. No sick contacts or travel recently. She reports mild generalized abdominal pain with diarrhea for 3 days associated with nausea and no vomiting. She also reports having a fall 2 weeks ago. Denies any pain . She reports chronic back pain for which she takes tramadol. Pt also reports taking tciks off her skin recently. She was referred to medical service for further evaluation of her colits and possible atypical pneumonia.  Stool studies have revealed positive for Salmonella and her antibiotics were narrowed to ciprofloxacin  Assessment & Plan: Active Problems:   Hypertension   Depression with anxiety   Chronic pain   Colitis   Fever chills   Sepsis ([place])   Salmonella enteritis   Sepsis due to Salmonella colitis with nausea, abdominal pain and diarrhea  -CT scan with evidence of colitis in the ascending colon -Started on Zosyn on admission, her antibiotics were switched to ciprofloxacin and GI pathogen panel showed presence of Salmonella -Febrile to 102.8 on admission, has been afebrile since then -She has ongoing diarrhea today,  continue IV fluids -C. difficile was negative -Blood cultures are without growth, pending  ?Atypical pneumonia -I doubt she has true pneumonia as she has minimal to no respiratory symptoms.  On antibiotics only  for #1  Hypertension -Controlled, 136/60 this morning, continue metoprolol  Depression with anxiety -continue home medications  Hypothyroidism -Resume synthroid -TSH normal at 3.7  Elevated liver function tests -In the setting of fatty liver, LFTs improving, acute hepatitis panel negative.  Gallbladder without acute findings on imaging  H/o tick bites -RMSF work up negative   DVT prophylaxis: Lovenox Code Status: Full code Family Communication: no family at bedside Disposition Plan: home when ready  Consultants:   None   Procedures:   None   Antimicrobials:  On IV Cipro  Subjective: Continues to have multiple episodes of diarrhea although frequency has improved. Recommending to continue to eat.  Objective: Vitals:   10/24/16 1511 10/24/16 2139 10/25/16 0534 10/25/16 1352  BP: 135/86 118/71 137/83 (!) 168/76  Pulse: 78 66 80 71  Resp: 18 18 18 18   Temp: 98.1 F (36.7 C) 98.8 F (37.1 C) 98.3 F (36.8 C) 98.6 F (37 C)  TempSrc: Oral Oral Oral Axillary  SpO2: 96% 95% 98% 100%  Weight:      Height:        Intake/Output Summary (Last 24 hours) at 10/25/16 1800 Last data filed at 10/25/16 1400  Gross per 24 hour  Intake             2580 ml  Output                0 ml  Net             2580 ml   Filed Weights   10/21/16 0629  Weight: 98.9 kg (218 lb)    Examination:  Vitals:   10/24/16 1511 10/24/16 2139 10/25/16 0534 10/25/16 1352  BP: 135/86 118/71 137/83 (!) 168/76  Pulse: 78 66 80 71  Resp: 18 18 18 18   Temp: 98.1 F (36.7 C) 98.8 F (37.1 C) 98.3 F (36.8 C) 98.6 F (37 C)  TempSrc: Oral Oral Oral Axillary  SpO2: 96% 95% 98% 100%  Weight:      Height:        Constitutional: NAD Respiratory: CTA Cardiovascular: RRR Abdomen: minimal tenderness throughout  Data Reviewed: I have independently reviewed following labs and imaging studies  CBC:  Recent Labs Lab 10/19/16 2033 10/21/16 0703 10/23/16 0528  WBC 12.0* 11.2* 5.3   NEUTROABS 9.9* 9.6*  --   HGB 14.7 14.4 12.0  HCT 43.4 42.1 35.0*  MCV 85.8 86.1 85.6  PLT 231 188 992   Basic Metabolic Panel:  Recent Labs Lab 10/19/16 2033 10/21/16 0703 10/22/16 0524 10/23/16 0528 10/24/16 0548  NA 136 136 134* 139 138  K 4.4 4.0 4.3 3.1* 3.3*  CL 101 102 103 110 108  CO2 25 24 22  21* 22  GLUCOSE 112* 121* 96 144* 110*  BUN 12 12 16 13 8   CREATININE 0.99 0.99 0.90 1.10* 0.80  CALCIUM 9.4 8.8* 8.0* 8.0* 8.2*   GFR: Estimated Creatinine Clearance: 81.7 mL/min (by C-G formula based on SCr of 0.8 mg/dL). Liver Function Tests:  Recent Labs Lab 10/19/16 2033 10/21/16 0703 10/22/16 0524  AST 64* 143* 58*  ALT 75* 169* 92*  ALKPHOS 114 127* 80  BILITOT 0.9 1.0 1.0  PROT 7.7 7.9 5.9*  ALBUMIN 4.3 4.0 2.7*    Recent Labs Lab 10/21/16 0703  LIPASE 23   No results for input(s): AMMONIA in the last 168 hours. Coagulation Profile: No results for input(s): INR, PROTIME in the last 168 hours. Cardiac Enzymes:  Recent Labs Lab 10/21/16 0755  TROPONINI <0.03   BNP (last 3 results)  Recent Labs  07/25/16 0958  PROBNP 119   HbA1C: No results for input(s): HGBA1C in the last 72 hours. CBG: No results for input(s): GLUCAP in the last 168 hours. Lipid Profile: No results for input(s): CHOL, HDL, LDLCALC, TRIG, CHOLHDL, LDLDIRECT in the last 72 hours. Thyroid Function Tests:  Recent Labs  10/23/16 0528  TSH 3.794   Anemia Panel: No results for input(s): VITAMINB12, FOLATE, FERRITIN, TIBC, IRON, RETICCTPCT in the last 72 hours. Urine analysis:    Component Value Date/Time   COLORURINE AMBER (A) 10/21/2016 0703   APPEARANCEUR HAZY (A) 10/21/2016 0703   LABSPEC 1.021 10/21/2016 0703   PHURINE 5.0 10/21/2016 0703   GLUCOSEU NEGATIVE 10/21/2016 0703   GLUCOSEU NEGATIVE 07/25/2010 1555   HGBUR SMALL (A) 10/21/2016 0703   BILIRUBINUR NEGATIVE 10/21/2016 0703   BILIRUBINUR negative 04/12/2016 1158   BILIRUBINUR neg 04/15/2014 0941    KETONESUR NEGATIVE 10/21/2016 0703   PROTEINUR 100 (A) 10/21/2016 0703   UROBILINOGEN 0.2 04/12/2016 1158   UROBILINOGEN 0.2 10/25/2014 1204   NITRITE NEGATIVE 10/21/2016 0703   LEUKOCYTESUR NEGATIVE 10/21/2016 0703   Sepsis Labs: Invalid input(s): PROCALCITONIN, LACTICIDVEN  Recent Results (from the past 240 hour(s))  Urine culture     Status: Abnormal   Collection Time: 10/21/16  7:42 AM  Result Value Ref Range Status   Specimen Description URINE, CLEAN CATCH  Final   Special Requests NONE  Final   Culture MULTIPLE SPECIES PRESENT, SUGGEST RECOLLECTION (A)  Final   Report Status 10/22/2016 FINAL  Final  Culture, blood (routine x 2)  Status: None (Preliminary result)   Collection Time: 10/21/16  8:43 AM  Result Value Ref Range Status   Specimen Description BLOOD RIGHT ANTECUBITAL  Final   Special Requests IN PEDIATRIC BOTTLE Blood Culture adequate volume  Final   Culture   Final    NO GROWTH 4 DAYS Performed at [facility], [place] [address] [place], [place] [address]    Report Status PENDING  Incomplete  Culture, blood (routine x 2)     Status: None (Preliminary result)   Collection Time: 10/21/16  8:48 AM  Result Value Ref Range Status   Specimen Description BLOOD LEFT HAND  Final   Special Requests IN PEDIATRIC BOTTLE Blood Culture adequate volume  Final   Culture   Final    NO GROWTH 4 DAYS Performed at [facility], [place] [address], [place],  [address]    Report Status PENDING  Incomplete  Gastrointestinal Panel by PCR , Stool     Status: Abnormal   Collection Time: 10/21/16 11:16 AM  Result Value Ref Range Status   Campylobacter species NOT DETECTED NOT DETECTED Final   Plesimonas shigelloides NOT DETECTED NOT DETECTED Final   Salmonella species DETECTED (A) NOT DETECTED Final    Comment: RESULT CALLED TO, READ BACK BY AND VERIFIED WITH: CHRISTY BLUM ON 10/22/16 AT 1650 [place]    Yersinia enterocolitica NOT DETECTED NOT DETECTED Final   Vibrio  species NOT DETECTED NOT DETECTED Final   Vibrio cholerae NOT DETECTED NOT DETECTED Final   Enteroaggregative E coli (EAEC) NOT DETECTED NOT DETECTED Final   Enteropathogenic E coli (EPEC) NOT DETECTED NOT DETECTED Final   Enterotoxigenic E coli (ETEC) NOT DETECTED NOT DETECTED Final   Shiga like toxin producing E coli (STEC) NOT DETECTED NOT DETECTED Final   Shigella/Enteroinvasive E coli (EIEC) NOT DETECTED NOT DETECTED Final   Cryptosporidium NOT DETECTED NOT DETECTED Final   Cyclospora cayetanensis NOT DETECTED NOT DETECTED Final   Entamoeba histolytica NOT DETECTED NOT DETECTED Final   Giardia lamblia NOT DETECTED NOT DETECTED Final   Adenovirus F40/41 NOT DETECTED NOT DETECTED Final   Astrovirus NOT DETECTED NOT DETECTED Final   Norovirus GI/GII NOT DETECTED NOT DETECTED Final   Rotavirus A NOT DETECTED NOT DETECTED Final   Sapovirus (I, II, IV, and V) NOT DETECTED NOT DETECTED Final  C difficile quick scan w PCR reflex     Status: None   Collection Time: 10/22/16 10:30 AM  Result Value Ref Range Status   C Diff antigen NEGATIVE NEGATIVE Final   C Diff toxin NEGATIVE NEGATIVE Final   C Diff interpretation No C. difficile detected.  Final      Radiology Studies: No results found.   Scheduled Meds: . amitriptyline  20 mg Oral QHS  . aspirin EC  81 mg Oral QODAY  . enoxaparin (LOVENOX) injection  40 mg Subcutaneous Q24H  . feeding supplement  1 Container Oral TID BM  . fluticasone  2 spray Each Nare Daily  . levothyroxine  75 mcg Oral QAC breakfast  . metoprolol succinate  25 mg Oral Daily  . pantoprazole  40 mg Oral Daily  . saccharomyces boulardii  250 mg Oral BID  . sucralfate  1 g Oral Daily   Continuous Infusions: . sodium chloride 100 mL/hr at 10/25/16 0831  . ciprofloxacin 400 mg (10/25/16 1753)     Author:  Berle Mull, MD [facility] Pager: [phone] 10/25/2016 6:00 PM     If 7PM-7AM, please contact  night-coverage [email or web address] Password  [email or web address] 10/25/2016, 6:00 PM

## 2016-10-26 LAB — CBC
HCT: 37.6 % (ref 36.0–46.0)
Hemoglobin: 13 g/dL (ref 12.0–15.0)
MCH: 28.8 pg (ref 26.0–34.0)
MCHC: 34.6 g/dL (ref 30.0–36.0)
MCV: 83.4 fL (ref 78.0–100.0)
Platelets: 274 10*3/uL (ref 150–400)
RBC: 4.51 MIL/uL (ref 3.87–5.11)
RDW: 14.5 % (ref 11.5–15.5)
WBC: 8.5 10*3/uL (ref 4.0–10.5)

## 2016-10-26 LAB — BASIC METABOLIC PANEL
Anion gap: 12 (ref 5–15)
BUN: 10 mg/dL (ref 6–20)
CO2: 19 mmol/L — ABNORMAL LOW (ref 22–32)
Calcium: 8.5 mg/dL — ABNORMAL LOW (ref 8.9–10.3)
Chloride: 108 mmol/L (ref 101–111)
Creatinine, Ser: 0.79 mg/dL (ref 0.44–1.00)
GFR calc Af Amer: >60 mL/min (ref >60)
GFR calc non Af Amer: >60 mL/min (ref >60)
Glucose, Bld: 126 mg/dL — ABNORMAL HIGH (ref 65–99)
Potassium: 3.4 mmol/L — ABNORMAL LOW (ref 3.5–5.1)
Sodium: 139 mmol/L (ref 135–145)

## 2016-10-26 LAB — CULTURE, BLOOD (ROUTINE X 2)
Culture: NO GROWTH
Culture: NO GROWTH
Special Requests: ADEQUATE
Special Requests: ADEQUATE

## 2016-10-26 LAB — MAGNESIUM: Magnesium: 1.4 mg/dL — ABNORMAL LOW (ref 1.7–2.4)

## 2016-10-26 MED ORDER — MAGNESIUM SULFATE 2 GM/50ML IV SOLN
2.0000 g | Freq: Once | INTRAVENOUS | Status: DC
  Filled 2016-10-26: qty 50

## 2016-10-26 MED ORDER — ONDANSETRON HCL 4 MG/2ML IJ SOLN: 4.0000 mg | Freq: Four times a day (QID) | INTRAMUSCULAR | Status: DC | PRN

## 2016-10-26 MED ORDER — POTASSIUM CHLORIDE CRYS ER 20 MEQ PO TBCR: 40.0000 meq | EXTENDED_RELEASE_TABLET | Freq: Once | ORAL | Status: DC

## 2016-10-26 MED ORDER — BOOST / RESOURCE BREEZE PO LIQD: 1.0000 | Freq: Three times a day (TID) | ORAL | 0 refills | Status: DC

## 2016-10-26 MED ORDER — CIPROFLOXACIN HCL 500 MG PO TABS: 500.0000 mg | ORAL_TABLET | Freq: Two times a day (BID) | ORAL | 0 refills | Status: AC

## 2016-10-26 MED ORDER — MAGNESIUM CHLORIDE 64 MG PO TBEC
1.0000 | DELAYED_RELEASE_TABLET | Freq: Once | ORAL | Status: DC
  Filled 2016-10-26: qty 1

## 2016-10-26 MED ORDER — SACCHAROMYCES BOULARDII 250 MG PO CAPS: 250.0000 mg | ORAL_CAPSULE | Freq: Two times a day (BID) | ORAL | 0 refills | Status: DC

## 2016-10-26 NOTE — Progress Notes (Signed)
Discharge instructions discussed with patient, verbalized agreement and understanding 

## 2016-10-26 NOTE — Discharge Summary (Signed)
[facility] Discharge Summary   Patient: [name] YQI:[id number]   PCP: Kelton Pillar, MD DOB: April 09, 1950   Date of admission: 10/21/2016   Date of discharge: 10/26/2016     Discharge Diagnoses:  Active Problems:   Hypertension   Depression with anxiety   Chronic pain   Colitis   Fever chills   Sepsis (Early)   Salmonella enteritis   Admitted From: home Disposition:  home  Recommendations for Outpatient Follow-up:  1. Follow-up with PCP in 1-2 weeks   Follow-up Information    Kelton Pillar, MD. Schedule an appointment as soon as possible for a visit in 2 week(s).   Specialty:  Family Medicine Contact information: [address] [place] [address] [phone]          Diet recommendation: Regular diet  Activity: The patient is advised to gradually reintroduce usual activities.  Discharge Condition: good  Code Status: Full code  History of present illness: As per the H and P dictated on admission, "[name] is a 66 y.o. female with medical history significant of hypertension, hypothyroidism, chronic pain syndrome, presents with fever, chills, abdominal pain, nausea,diarrhea , cough for 3 days. On arrival to ED, she was found to be febrile, with tmax of 100.8. CT abd shows colits and CXR shows Mild patchy linear density over the right midlung and left base likely atelectasis. She reports having cough with productive sputum almost for a month and worsened over the last three days. No sick contacts or travel recently. She reports mild generalized abdominal pain with diarrhea for 3 days associated with nausea and no vomiting. She also reports having a fall 2 weeks ago. Denies any pain . She reports chronic back pain for which she takes tramadol. Pt also reports taking tciks off her skin recently.  She was referred to medical service for further evaluation of her colits and possible atypical pneumonia. "  Hospital Course:    Summary of her active problems in the hospital is as following. Sepsis due to Salmonella colitis with nausea, abdominal pain and diarrhea  -CT scan with evidence of colitis in the ascending colon -Started on Zosyn on admission, her antibiotics were switched to ciprofloxacin and GI pathogen panel showed presence of Salmonella -Febrile to 102.8 on admission, has been afebrile since then -She has ongoing diarrhea but frequency is significantly better. -C. difficile was negative -Blood cultures are without growth,  Complete course with oral Cipro.   ?Atypical pneumonia -I doubt she has true pneumonia as she has minimal to no respiratory symptoms.  On antibiotics only for #1  Hypertension -Controlled, continue home regimen  Depression with anxiety -continue home medications  Hypothyroidism -Resume synthroid -TSH normal at 3.7  Elevated liver function tests -In the setting of fatty liver, LFTs improving, acute hepatitis panel negative.  Gallbladder without acute findings on imaging  H/o tick bites -RMSF work up negative  All other chronic medical condition were stable during the hospitalization.  Patient was ambulatory without any assistance. On the day of the discharge the patient's vitals were stable, and no other acute medical condition were reported by patient. the patient was felt safe to be discharge at home with family.  Procedures and Results:  none   Consultations:  none  DISCHARGE MEDICATION: Discharge Medication List as of 10/26/2016 11:37 AM    START taking these medications   Details  ciprofloxacin (CIPRO) 500 MG tablet Take 1 tablet (500 mg total) by mouth 2 (two) times  daily., Starting Thu 10/26/2016, Until Sun 10/29/2016, Normal    feeding supplement (BOOST / RESOURCE BREEZE) LIQD Take 1 Container by mouth 3 (three) times daily between meals., Starting Thu 10/26/2016, Normal    saccharomyces boulardii (FLORASTOR) 250 MG capsule Take 1 capsule (250 mg  total) by mouth 2 (two) times daily., Starting Thu 10/26/2016, Normal      CONTINUE these medications which have NOT CHANGED   Details  acetaminophen (TYLENOL) 500 MG tablet Take 1,000 mg by mouth every 6 (six) hours as needed for mild pain, moderate pain or headache. , Historical Med    amitriptyline (ELAVIL) 10 MG tablet Take 4 tablets (40 mg total) by mouth at bedtime., Until Discontinued, Normal    aspirin EC 81 MG tablet Take 81 mg by mouth every other day. , Until Discontinued, Historical Med    B Complex Vitamins (VITAMIN B COMPLEX PO) Take 1 tablet by mouth daily. , Until Discontinued, Historical Med    cholecalciferol (VITAMIN D) 1000 units tablet Take 1,000 Units by mouth daily., Historical Med    clonazePAM (KLONOPIN) 1 MG tablet Take 1 tablet (1 mg total) by mouth at bedtime as needed for anxiety., Until Discontinued, Phone In    esomeprazole (NEXIUM) 20 MG capsule Take 20 mg by mouth daily before breakfast., Historical Med    fluticasone (FLONASE) 50 MCG/ACT nasal spray Place into both nostrils daily., Historical Med    Guaifenesin (MUCINEX MAXIMUM STRENGTH) 1200 MG TB12 Take 1 tablet (1,200 mg total) by mouth every 12 (twelve) hours as needed., Starting Tue 07/25/2016, Normal    Hyoscyamine Sulfate SL (LEVSIN/SL) 0.125 MG SUBL Place 0.125 mg under the tongue 4 (four) times daily., Starting Wed 04/12/2016, Normal    levothyroxine (SYNTHROID, LEVOTHROID) 25 MCG tablet Take 75 mcg by mouth daily before breakfast. , Historical Med    Magnesium 300 MG CAPS Take 300 mg by mouth daily., Historical Med    METOPROLOL SUCCINATE ER PO Take 25 mg by mouth daily. , Historical Med    !! Multiple Vitamins-Minerals (ZINC PO) Take by mouth., Historical Med    sucralfate (CARAFATE) 1 G tablet Take 1 g by mouth daily., Starting 09/03/2014, Until Discontinued, Historical Med    tiZANidine (ZANAFLEX) 2 MG tablet Take 2 mg by mouth 2 (two) times daily. Reported on 04/05/2015, Until  Discontinued, Historical Med    traMADol (ULTRAM) 50 MG tablet Take by mouth every 6 (six) hours as needed., Historical Med    tretinoin (RETIN-A) 0.025 % cream Apply topically at bedtime., Historical Med    vitamin E 400 UNIT capsule Take 400 Units by mouth every other day. Reported on 04/05/2015, Until Discontinued, Historical Med    !! zinc gluconate 50 MG tablet Take 100 mg by mouth daily. , Historical Med     !! - Potential duplicate medications found. Please discuss with provider.     Allergies  Allergen Reactions  . Lyrica [Pregabalin] Anaphylaxis  . Nsaids Swelling    swelling  . Tolmetin Swelling  . Prednisone Swelling  . Tape Other (See Comments)    *adhesive* Reaction- blisters   . Captopril   . Lisinopril Swelling  . Caine-1 [Lidocaine Hcl] Palpitations  . Celebrex [Celecoxib] Palpitations  . Codeine Palpitations  . Nephron [Epinephrine] Palpitations    NASAL SPRAY  . Novocain [Procaine] Other (See Comments)    dizziness  . Other Palpitations    Glucocorticoids/Steroids  . Procaine Hcl Palpitations  . Sulfa Antibiotics Palpitations   Discharge Instructions  Diet - low sodium heart healthy    Complete by:  As directed    Discharge instructions    Complete by:  As directed    It is important that you read following instructions as well as go over your medication list with RN to help you understand your care after this hospitalization.  Discharge Instructions: Please follow-up with PCP in 2 week  Please request your primary care physician to go over all Hospital Tests and Procedure/Radiological results at the follow up,  Please get all Hospital records sent to your PCP by signing hospital release before you go home.   Do not take more than prescribed Pain, Sleep and Anxiety Medications. You were cared for by a hospitalist during your hospital stay. If you have any questions about your discharge medications or the care you received while you were in the  hospital after you are discharged, you can call the unit and ask to speak with the hospitalist on call if the hospitalist that took care of you is not available.  Once you are discharged, your primary care physician will handle any further medical issues. Please note that NO REFILLS for any discharge medications will be authorized once you are discharged, as it is imperative that you return to your primary care physician (or establish a relationship with a primary care physician if you do not have one) for your aftercare needs so that they can reassess your need for medications and monitor your lab values. You Must read complete instructions/literature along with all the possible adverse reactions/side effects for all the Medicines you take and that have been prescribed to you. Take any new Medicines after you have completely understood and accept all the possible adverse reactions/side effects. Wear Seat belts while driving. If you have smoked or chewed Tobacco in the last 2 yrs please stop smoking and/or stop any Recreational drug use.   Increase activity slowly    Complete by:  As directed      Discharge Exam: Filed Weights   10/21/16 0629  Weight: 98.9 kg (218 lb)   Vitals:   10/25/16 2049 10/26/16 0544  BP: 121/66 138/66  Pulse: 71 68  Resp: 18 18  Temp: 98.9 F (37.2 C) 99.1 F (37.3 C)  SpO2: 98% 95%   General: Appear in no distress, no Rash; Oral Mucosa moist. Cardiovascular: S1 and S2 Present, no Murmur, no JVD Respiratory: Bilateral Air entry present and Clear to Auscultation, no Crackles, no wheezes Abdomen: Bowel Sound resent, Soft and no tenderness Extremities: no Pedal edema, no calf tenderness Neurology: Grossly no focal neuro deficit.  The results of significant diagnostics from this hospitalization (including imaging, microbiology, ancillary and laboratory) are listed below for reference.    Significant Diagnostic Studies: Dg Chest 2 View  Result Date:  10/21/2016 CLINICAL DATA:  Fever, chills, nausea, vomiting and diarrhea. Leg pain 1-2 weeks worsening today. Recent falls and multiple previous back surgeries. Headache. EXAM: CHEST  2 VIEW COMPARISON:  10/19/2016 FINDINGS: Lungs are adequately inflated with patchy linear density over the right midlung and left lung base suggesting atelectasis. No effusion or pneumothorax. Cardiomediastinal silhouette is within normal. Anterior fusion hardware over the lower cervical spine unchanged. Minimal degenerate change of the spine. IMPRESSION: Mild patchy linear density over the right midlung and left base likely atelectasis. Cannot completely exclude early infection. Electronically Signed   By: Marin Olp M.D.   On: 10/21/2016 08:37   Dg Chest 2 View  Result Date: 10/19/2016 CLINICAL DATA:  Acute chest pain and fever for 1 day. EXAM: CHEST  2 VIEW COMPARISON:  07/25/2016 and prior chest radiograph FINDINGS: The cardiomediastinal silhouette is unremarkable. There is no evidence of focal airspace disease, pulmonary edema, suspicious pulmonary nodule/mass, pleural effusion, or pneumothorax. No acute bony abnormalities are identified. IMPRESSION: No active cardiopulmonary disease. Electronically Signed   By: Margarette Canada M.D.   On: 10/19/2016 20:54   Ct Abdomen Pelvis W Contrast  Result Date: 10/21/2016 CLINICAL DATA:  Abdominal pain and diarrhea EXAM: CT ABDOMEN AND PELVIS WITH CONTRAST TECHNIQUE: Multidetector CT imaging of the abdomen and pelvis was performed using the standard protocol following bolus administration of intravenous contrast. CONTRAST:  118mL ISOVUE-300 IOPAMIDOL (ISOVUE-300) INJECTION 61% COMPARISON:  11/18/2015 FINDINGS: Lower chest: Mild bibasilar atelectatic changes are noted. No sizable effusion is seen. Hepatobiliary: Fatty infiltration of the liver is noted. The gallbladder is within normal limits. Pancreas: Unremarkable. No pancreatic ductal dilatation or surrounding inflammatory changes.  Spleen: Normal in size without focal abnormality. Adrenals/Urinary Tract: Adrenal glands are unremarkable. Kidneys are normal, without renal calculi, focal lesion, or hydronephrosis. Bladder is unremarkable. Stomach/Bowel: The appendix has been surgically removed. Mild pericolonic inflammatory changes are noted in the ascending colon which may represent focal colitis. The remainder the colon is within normal limits. No obstructive changes are seen. Vascular/Lymphatic: Aortic atherosclerosis. No enlarged abdominal or pelvic lymph nodes. Reproductive: Uterus and bilateral adnexa are unremarkable. Other: No abdominal wall hernia or abnormality. No abdominopelvic ascites. Musculoskeletal: Postsurgical changes are noted from L3-S1. IMPRESSION: Changes of mild colitis in the ascending colon. No perforation or abscess is seen. No other acute abnormality is noted. Electronically Signed   By: Inez Catalina M.D.   On: 10/21/2016 08:34    Microbiology: Recent Results (from the past 240 hour(s))  Urine culture     Status: Abnormal   Collection Time: 10/21/16  7:42 AM  Result Value Ref Range Status   Specimen Description URINE, CLEAN CATCH  Final   Special Requests NONE  Final   Culture MULTIPLE SPECIES PRESENT, SUGGEST RECOLLECTION (A)  Final   Report Status 10/22/2016 FINAL  Final  Culture, blood (routine x 2)     Status: None   Collection Time: 10/21/16  8:43 AM  Result Value Ref Range Status   Specimen Description BLOOD RIGHT ANTECUBITAL  Final   Special Requests IN PEDIATRIC BOTTLE Blood Culture adequate volume  Final   Culture   Final    NO GROWTH 5 DAYS Performed at [facility], [place] [address] [place], [place] [address]    Report Status 10/26/2016 FINAL  Final  Culture, blood (routine x 2)     Status: None   Collection Time: 10/21/16  8:48 AM  Result Value Ref Range Status   Specimen Description BLOOD LEFT HAND  Final   Special Requests IN PEDIATRIC BOTTLE Blood Culture adequate volume   Final   Culture   Final    NO GROWTH 5 DAYS Performed at [facility], [place] [address] [place], [place] [address]    Report Status 10/26/2016 FINAL  Final  Gastrointestinal Panel by PCR , Stool     Status: Abnormal   Collection Time: 10/21/16 11:16 AM  Result Value Ref Range Status   Campylobacter species NOT DETECTED NOT DETECTED Final   Plesimonas shigelloides NOT DETECTED NOT DETECTED Final   Salmonella species DETECTED (A) NOT DETECTED Final    Comment: RESULT CALLED TO, READ BACK BY AND VERIFIED WITH: CHRISTY BLUM ON 10/22/16  AT 1650 [place]    Yersinia enterocolitica NOT DETECTED NOT DETECTED Final   Vibrio species NOT DETECTED NOT DETECTED Final   Vibrio cholerae NOT DETECTED NOT DETECTED Final   Enteroaggregative E coli (EAEC) NOT DETECTED NOT DETECTED Final   Enteropathogenic E coli (EPEC) NOT DETECTED NOT DETECTED Final   Enterotoxigenic E coli (ETEC) NOT DETECTED NOT DETECTED Final   Shiga like toxin producing E coli (STEC) NOT DETECTED NOT DETECTED Final   Shigella/Enteroinvasive E coli (EIEC) NOT DETECTED NOT DETECTED Final   Cryptosporidium NOT DETECTED NOT DETECTED Final   Cyclospora cayetanensis NOT DETECTED NOT DETECTED Final   Entamoeba histolytica NOT DETECTED NOT DETECTED Final   Giardia lamblia NOT DETECTED NOT DETECTED Final   Adenovirus F40/41 NOT DETECTED NOT DETECTED Final   Astrovirus NOT DETECTED NOT DETECTED Final   Norovirus GI/GII NOT DETECTED NOT DETECTED Final   Rotavirus A NOT DETECTED NOT DETECTED Final   Sapovirus (I, II, IV, and V) NOT DETECTED NOT DETECTED Final  C difficile quick scan w PCR reflex     Status: None   Collection Time: 10/22/16 10:30 AM  Result Value Ref Range Status   C Diff antigen NEGATIVE NEGATIVE Final   C Diff toxin NEGATIVE NEGATIVE Final   C Diff interpretation No C. difficile detected.  Final     Labs: CBC:  Recent Labs Lab 10/19/16 2033 10/21/16 0703 10/23/16 0528 10/26/16 0806  WBC 12.0* 11.2* 5.3 8.5   NEUTROABS 9.9* 9.6*  --   --   HGB 14.7 14.4 12.0 13.0  HCT 43.4 42.1 35.0* 37.6  MCV 85.8 86.1 85.6 83.4  PLT 231 188 156 604   Basic Metabolic Panel:  Recent Labs Lab 10/21/16 0703 10/22/16 0524 10/23/16 0528 10/24/16 0548 10/26/16 0806  NA 136 134* 139 138 139  K 4.0 4.3 3.1* 3.3* 3.4*  CL 102 103 110 108 108  CO2 24 22 21* 22 19*  GLUCOSE 121* 96 144* 110* 126*  BUN 12 16 13 8 10   CREATININE 0.99 0.90 1.10* 0.80 0.79  CALCIUM 8.8* 8.0* 8.0* 8.2* 8.5*  MG  --   --   --   --  1.4*   Liver Function Tests:  Recent Labs Lab 10/19/16 2033 10/21/16 0703 10/22/16 0524  AST 64* 143* 58*  ALT 75* 169* 92*  ALKPHOS 114 127* 80  BILITOT 0.9 1.0 1.0  PROT 7.7 7.9 5.9*  ALBUMIN 4.3 4.0 2.7*    Recent Labs Lab 10/21/16 0703  LIPASE 23   No results for input(s): AMMONIA in the last 168 hours. Cardiac Enzymes:  Recent Labs Lab 10/21/16 0755  TROPONINI <0.03   BNP (last 3 results) No results for input(s): BNP in the last 8760 hours. CBG: No results for input(s): GLUCAP in the last 168 hours. Time spent: 35 minutes  Signed:  Jannel Lynne  [facility] 10/26/2016 , 3:00 PM

## 2016-10-26 NOTE — Care Management Important Message (Signed)
Important Message  Patient Details  Name: [name] MRN: [id number] Date of Birth: Jul 24, 1950   Medicare Important Message Given:  Yes    Kerin Salen 10/26/2016, 10:37 AMImportant Message  Patient Details  Name: [name] MRN: [id number] Date of Birth: 07-Nov-1950   Medicare Important Message Given:  Yes    Kerin Salen 10/26/2016, 10:37 AM

## 2016-11-07 DIAGNOSIS — I129 Hypertensive chronic kidney disease with stage 1 through stage 4 chronic kidney disease, or unspecified chronic kidney disease: Secondary | ICD-10-CM | POA: Diagnosis not present

## 2016-11-07 DIAGNOSIS — N183 Chronic kidney disease, stage 3 (moderate): Secondary | ICD-10-CM | POA: Diagnosis not present

## 2016-11-07 DIAGNOSIS — A09 Infectious gastroenteritis and colitis, unspecified: Secondary | ICD-10-CM | POA: Diagnosis not present

## 2016-11-07 DIAGNOSIS — K760 Fatty (change of) liver, not elsewhere classified: Secondary | ICD-10-CM | POA: Diagnosis not present

## 2016-11-17 DIAGNOSIS — M25552 Pain in left hip: Secondary | ICD-10-CM | POA: Diagnosis not present

## 2016-11-17 DIAGNOSIS — M25551 Pain in right hip: Secondary | ICD-10-CM | POA: Diagnosis not present

## 2016-11-17 DIAGNOSIS — M545 Low back pain: Secondary | ICD-10-CM | POA: Diagnosis not present

## 2016-11-17 DIAGNOSIS — M1712 Unilateral primary osteoarthritis, left knee: Secondary | ICD-10-CM | POA: Diagnosis not present

## 2016-12-07 DIAGNOSIS — L820 Inflamed seborrheic keratosis: Secondary | ICD-10-CM | POA: Diagnosis not present

## 2016-12-07 DIAGNOSIS — L812 Freckles: Secondary | ICD-10-CM | POA: Diagnosis not present

## 2016-12-07 DIAGNOSIS — Z85820 Personal history of malignant melanoma of skin: Secondary | ICD-10-CM | POA: Diagnosis not present

## 2016-12-07 DIAGNOSIS — D225 Melanocytic nevi of trunk: Secondary | ICD-10-CM | POA: Diagnosis not present

## 2016-12-07 DIAGNOSIS — D1801 Hemangioma of skin and subcutaneous tissue: Secondary | ICD-10-CM | POA: Diagnosis not present

## 2016-12-07 DIAGNOSIS — L814 Other melanin hyperpigmentation: Secondary | ICD-10-CM | POA: Diagnosis not present

## 2016-12-07 DIAGNOSIS — L821 Other seborrheic keratosis: Secondary | ICD-10-CM | POA: Diagnosis not present

## 2016-12-25 DIAGNOSIS — Z0000 Encounter for general adult medical examination without abnormal findings: Secondary | ICD-10-CM | POA: Diagnosis not present

## 2016-12-25 DIAGNOSIS — M810 Age-related osteoporosis without current pathological fracture: Secondary | ICD-10-CM | POA: Diagnosis not present

## 2016-12-25 DIAGNOSIS — N183 Chronic kidney disease, stage 3 (moderate): Secondary | ICD-10-CM | POA: Diagnosis not present

## 2016-12-25 DIAGNOSIS — Z6837 Body mass index (BMI) 37.0-37.9, adult: Secondary | ICD-10-CM | POA: Diagnosis not present

## 2016-12-25 DIAGNOSIS — Z1389 Encounter for screening for other disorder: Secondary | ICD-10-CM | POA: Diagnosis not present

## 2016-12-25 DIAGNOSIS — I129 Hypertensive chronic kidney disease with stage 1 through stage 4 chronic kidney disease, or unspecified chronic kidney disease: Secondary | ICD-10-CM | POA: Diagnosis not present

## 2016-12-25 DIAGNOSIS — E039 Hypothyroidism, unspecified: Secondary | ICD-10-CM | POA: Diagnosis not present

## 2016-12-25 DIAGNOSIS — Z23 Encounter for immunization: Secondary | ICD-10-CM | POA: Diagnosis not present

## 2016-12-25 DIAGNOSIS — K589 Irritable bowel syndrome without diarrhea: Secondary | ICD-10-CM | POA: Diagnosis not present

## 2016-12-25 DIAGNOSIS — K760 Fatty (change of) liver, not elsewhere classified: Secondary | ICD-10-CM | POA: Diagnosis not present

## 2016-12-25 DIAGNOSIS — G629 Polyneuropathy, unspecified: Secondary | ICD-10-CM | POA: Diagnosis not present

## 2016-12-25 DIAGNOSIS — M797 Fibromyalgia: Secondary | ICD-10-CM | POA: Diagnosis not present

## 2017-02-20 DIAGNOSIS — E039 Hypothyroidism, unspecified: Secondary | ICD-10-CM | POA: Diagnosis not present

## 2017-02-20 DIAGNOSIS — R635 Abnormal weight gain: Secondary | ICD-10-CM | POA: Diagnosis not present

## 2017-02-20 DIAGNOSIS — Z8349 Family history of other endocrine, nutritional and metabolic diseases: Secondary | ICD-10-CM | POA: Diagnosis not present

## 2017-02-20 DIAGNOSIS — R748 Abnormal levels of other serum enzymes: Secondary | ICD-10-CM | POA: Diagnosis not present

## 2017-02-20 DIAGNOSIS — J019 Acute sinusitis, unspecified: Secondary | ICD-10-CM | POA: Diagnosis not present

## 2017-02-20 DIAGNOSIS — Z92241 Personal history of systemic steroid therapy: Secondary | ICD-10-CM | POA: Diagnosis not present

## 2017-02-20 DIAGNOSIS — M8588 Other specified disorders of bone density and structure, other site: Secondary | ICD-10-CM | POA: Diagnosis not present

## 2017-02-20 NOTE — Progress Notes (Deleted)
Cardiology Office Note:    Date:  02/20/2017   ID:  [name], DOB 06-04-1950, MRN [id number]  PCP:  Kelton Pillar, MD  Cardiologist:  Shirlee More, MD    Referring MD: Kelton Pillar, MD    ASSESSMENT:    No diagnosis found. PLAN:    In order of problems listed above:  1. ***   Next appointment: ***   Medication Adjustments/Labs and Tests Ordered: Current medicines are reviewed at length with the patient today.  Concerns regarding medicines are outlined above.  No orders of the defined types were placed in this encounter.  No orders of the defined types were placed in this encounter.   No chief complaint on file.   History of Present Illness:    [name] is a 66 y.o. female with a hx of  CKD with hypertension, chronic pain on a TCAD  last seen in July 2017.She had a hospital admission in August 2018 with SIRS due to salmonella colitis Compliance with diet, lifestyle and medications: *** Past Medical History:  Diagnosis Date  . Allergy   . Arthritis   . Chronic back pain   . Chronic kidney disease   . Chronic knee pain   . Collagen vascular disease ([place])   . Deformity of joint 04/23/2013  . Derangement of knee, left 2009   started with fall at work. Has had repair patella and torn meniscus  . High cholesterol   . Hx of appendectomy   . Hypertension   . Melanoma ([place]) 11/19/2015  . Migraine headache    hormonal related - less frequent off hormone replacement  . Migraines   . Neuropathy 04/23/2013  . Psoriasis 04/23/2013  . Raynaud disease   . SVT (supraventricular tachycardia) ([place])   . Synovial cyst of lumbar facet joint    lower back - s/p surgery     Past Surgical History:  Procedure Laterality Date  . APPENDECTOMY  1971  . CERVICAL DISCECTOMY  1999   diskectomy with fixation:plate and screws  . KNEE ARTHROSCOPY W/ MENISCAL REPAIR  09   left knee  . L4-5 lumbar fusion  September 02, 2010   Dr. Hal Neer:  minimally invasive  transforaminal interbody fusion with spacer  . OVARIAN CYST SURGERY  1972  . PATELLAR REEFING  '09   repair of fractured patella after fall  . SPINE SURGERY    . SYNOVIAL CYST EXCISION  2007   lumbar spine  . VENOUS ABLATION     for pain in the groin - VVTS did procedure. Has some residual discomfort at the  ablation site.     Current Medications: No outpatient medications have been marked as taking for the 02/22/17 encounter (Appointment) with Richardo Priest, MD.     Allergies:   Lyrica [pregabalin]; Nsaids; Tolmetin; Prednisone; Tape; Captopril; Lisinopril; Caine-1 [lidocaine hcl]; Celebrex [celecoxib]; Codeine; Nephron [epinephrine]; Novocain [procaine]; Other; Procaine hcl; and Sulfa antibiotics   Social History   Socioeconomic History  . Marital status: Widowed    Spouse name: Not on file  . Number of children: Not on file  . Years of education: 33  . Highest education level: Not on file  Social Needs  . Financial resource strain: Not on file  . Food insecurity - worry: Not on file  . Food insecurity - inability: Not on file  . Transportation needs - medical: Not on file  . Transportation needs - non-medical: Not on file  Occupational History  . Occupation: RECEP/ADMIN  ASST.    Employer: [employer]  Tobacco Use  . Smoking status: Never Smoker  . Smokeless tobacco: Never Used  Substance and Sexual Activity  . Alcohol use: No  . Drug use: No  . Sexual activity: Yes    Partners: Male    Birth control/protection: None  Other Topics Concern  . 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 1 dtr- '72; 1 son '73; 4 grandchildren, 2 step-grands. 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      Family History: The patient's ***family history includes Aortic aneurysm in her mother; COPD in her mother; Cancer in her father; Diabetes in  her brother, father, and mother; Heart disease in her father; Hyperlipidemia in her father; Hypertension in her father and mother; Hypothyroidism in her mother. ROS:   Please see the history of present illness.    All other systems reviewed and are negative.  EKGs/Labs/Other Studies Reviewed:    The following studies were reviewed today:  EKG:  EKG ordered today.  The ekg ordered today demonstrates *** EKG 10/21/16 showed sinus tachycardia and non specific T waves Recent Labs: 07/25/2016: NT-Pro BNP 119 10/22/2016: ALT 92 10/23/2016: TSH 3.794 10/26/2016: BUN 10; Creatinine, Ser 0.79; Hemoglobin 13.0; Magnesium 1.4; Platelets 274; Potassium 3.4; Sodium 139  Recent Lipid Panel    Component Value Date/Time   CHOL 189 12/07/2011 1252   TRIG 127.0 12/07/2011 1252   HDL 49.30 12/07/2011 1252   CHOLHDL 4 12/07/2011 1252   VLDL 25.4 12/07/2011 1252   LDLCALC 114 (H) 12/07/2011 1252    Physical Exam:    VS:  There were no vitals taken for this visit.    Wt Readings from Last 3 Encounters:  10/21/16 218 lb (98.9 kg)  10/19/16 200 lb (90.7 kg)  07/25/16 220 lb 9.6 oz (100.1 kg)     GEN: *** Well nourished, well developed in no acute distress HEENT: Normal NECK: No JVD; No carotid bruits LYMPHATICS: No lymphadenopathy CARDIAC: ***RRR, no murmurs, rubs, gallops RESPIRATORY:  Clear to auscultation without rales, wheezing or rhonchi  ABDOMEN: Soft, non-tender, non-distended MUSCULOSKELETAL:  No edema; No deformity  SKIN: Warm and dry NEUROLOGIC:  Alert and oriented x 3 PSYCHIATRIC:  Normal affect    Signed, Shirlee More, MD  02/20/2017 9:23 AM    [place]

## 2017-02-22 ENCOUNTER — Ambulatory Visit: Payer: Medicare Other | Admitting: Cardiology

## 2017-02-23 DIAGNOSIS — M25551 Pain in right hip: Secondary | ICD-10-CM | POA: Diagnosis not present

## 2017-02-27 DIAGNOSIS — M797 Fibromyalgia: Secondary | ICD-10-CM

## 2017-02-27 HISTORY — DX: Fibromyalgia: M79.7

## 2017-02-28 ENCOUNTER — Ambulatory Visit: Payer: BC Managed Care – PPO | Admitting: Cardiology

## 2017-03-01 ENCOUNTER — Ambulatory Visit: Payer: Medicare Other | Admitting: Cardiology

## 2017-03-16 ENCOUNTER — Other Ambulatory Visit: Payer: Self-pay

## 2017-03-16 ENCOUNTER — Encounter: Payer: Self-pay | Admitting: Cardiology

## 2017-03-16 ENCOUNTER — Ambulatory Visit (INDEPENDENT_AMBULATORY_CARE_PROVIDER_SITE_OTHER): Payer: Medicare Other | Admitting: Cardiology

## 2017-03-16 VITALS — BP 112/64 | HR 84 | Ht 65.0 in | Wt 221.0 lb

## 2017-03-16 DIAGNOSIS — N181 Chronic kidney disease, stage 1: Secondary | ICD-10-CM

## 2017-03-16 DIAGNOSIS — I129 Hypertensive chronic kidney disease with stage 1 through stage 4 chronic kidney disease, or unspecified chronic kidney disease: Secondary | ICD-10-CM | POA: Diagnosis not present

## 2017-03-16 MED ORDER — METOPROLOL SUCCINATE ER 25 MG PO TB24: 25.0000 mg | ORAL_TABLET | Freq: Every day | ORAL | 3 refills | Status: DC

## 2017-03-16 NOTE — Progress Notes (Signed)
Cardiology Office Note:    Date:  03/16/2017   ID:  [name], DOB 1950/04/24, MRN [id number]  PCP:  Kelton Pillar, MD  Cardiologist:  Shirlee More, MD    Referring MD: Kelton Pillar, MD    ASSESSMENT:    1. Hypertensive kidney disease with stage 1 chronic kidney disease    PLAN:    In order of problems listed above:  1. Stable blood pressure target continue low-dose beta-blocker.   Next appointment: One year   Medication Adjustments/Labs and Tests Ordered: Current medicines are reviewed at length with the patient today.  Concerns regarding medicines are outlined above.  No orders of the defined types were placed in this encounter.  No orders of the defined types were placed in this encounter.   Chief Complaint  Patient presents with  . Hypertension  . Palpitations    History of Present Illness:    [name] is a 66 y.o. female with a hx of palpitation,hypertension and CKD, chronic pain, hypothyroidism and abdominal aorta atherosclerosis last seen in July 2017. She was admitted to [facility] in August 2018 with salmonella enteritiis and SIRS. Compliance with diet, lifestyle and medications: Yes  Her BP has remained at target and she has had no breakthrough episodes of palpitation or rapid heart rhythm.  Presently she is on antibiotics with bronchitis. Past Medical History:  Diagnosis Date  . Allergy   . Anemia, iron deficiency 01/22/2011   May '12 - 13.8 Hgb, Nov 2, '12 Hgb 10.9   . Arthritis   . 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 CAP (community acquired pneumonia) 10/21/2016  . Chest pain in adult 07/08/2010   March 5, '09 negative cardiolite Sept 28, '10 negative nuclear stress (cardiolite) Dec 21, '11  Negative nuclear stress Brantley Fling) June 26, '14 Negative nuclear stress - [place] with Dr. Bettina Gavia per patient report.  Overview:  CAD risk score is low at 3%  . Chondromalacia patellae 12/02/2012  . Chronic back pain   . Chronic  kidney disease   . Chronic knee pain   . Chronic pain 09/22/2010   Multiple sources of pain: radicular pain from lumbar disease and post-operative pain; neck pain from cervical disk disease; left knee pain from trauma.  Plan - will wean off prednisone           Will continue percocet at 1  5/325 norco every 6 hours prn           NSAID - trial of meloxicam 59m g once a day           Increase gabapentin slowly to 600mg  tid and reassess           Executed a pain con  . Chronic venous insufficiency 10/10/2013  . Colitis 10/21/2016  . Collagen vascular disease ([place])   . Deformity of joint 04/23/2013  . Depression with anxiety 09/21/2010  . Derangement of knee, left 2009   started with fall at work. Has had repair patella and torn meniscus  . DJD (degenerative joint disease) of knee 09/02/2012  . Essential hypertension 07/08/2010   Long standing hypertension medically managed. Currently seeing Dr. Daneen Schick and Dr. Marin Roberts ([facility]) for cardiology.  Meds  HCT only  Overview:  Overview:  Long standing hypertension medically managed. Currently seeing Dr. Daneen Schick and Dr. Bettina Gavia ([facility]) for cardiology.  Meds  HCT only  Last Assessment & Plan:  Formatting of this note may be different from the original. BP Readings  . Fever chills  10/21/2016  . Fibromyalgia 02/27/2017  . High cholesterol   . Hx of appendectomy   . Hypertension   . Knee internal derangement 07/08/2010   Left knee: Feb '09 medial and lateral meniscal tears by MRI                  April '09 arthroscopic surgery   . Melanoma ([place]) 11/19/2015  . Migraine headache    hormonal related - less frequent off hormone replacement  . Migraines   . Neuropathy 04/23/2013  . Peripheral cyanosis 09/28/2012   Full evaluation by Dr. Tobie Lords for rheumatology - no evidence of Raynaud's syndrome or disease; no evidence of underlying rheumatologic disease or malignancy. Most probable cause is neurogenic.   . Pseudoarthrosis of lumbar spine  01/02/2011  . Psoriasis 04/23/2013  . Raynaud disease   . Routine health maintenance 08/17/2011   Colonoscopy - no record in EPIC of colonoscopy  Immunizations - Tdap June '13.    . Salmonella enteritis 10/23/2016  . Sepsis ([place]) 10/23/2016  . Spinal stenosis 08/14/2010   history of cervical laminectomy, lumbar diskectomy with fixation devices and redo surgery.   . Status post lumbar spinal fusion 10/02/2011  . SVT (supraventricular tachycardia) ([place])   . Synovial cyst of lumbar facet joint    lower back - s/p surgery   . Tear of lateral meniscus of left knee 09/30/2012    Past Surgical History:  Procedure Laterality Date  . APPENDECTOMY  1971  . CERVICAL DISCECTOMY  1999   diskectomy with fixation:plate and screws  . COLONOSCOPY    . ESOPHAGOGASTRODUODENOSCOPY  12/2014  . KNEE ARTHROSCOPY W/ MENISCAL REPAIR  09   left knee  . L4-5 lumbar fusion  September 02, 2010   Dr. Hal Neer:  minimally invasive transforaminal interbody fusion with spacer  . OVARIAN CYST SURGERY  1972  . PATELLAR REEFING  '09   repair of fractured patella after fall  . SPINE SURGERY    . SYNOVIAL CYST EXCISION  2007   lumbar spine  . VENOUS ABLATION     for pain in the groin - [facility] did procedure. Has some residual discomfort at the  ablation site.   . WISDOM TOOTH EXTRACTION      Current Medications: Current Meds  Medication Sig  . acetaminophen (TYLENOL) 500 MG tablet Take 1,000 mg by mouth every 6 (six) hours as needed for mild pain, moderate pain or headache.   Marland Kitchen amitriptyline (ELAVIL) 10 MG tablet Take 4 tablets (40 mg total) by mouth at bedtime. (Patient taking differently: Take 20 mg by mouth at bedtime. )  . amoxicillin-clavulanate (AUGMENTIN) 875-125 MG tablet   . aspirin EC 81 MG tablet Take 81 mg by mouth every other day.   . B Complex Vitamins (VITAMIN B COMPLEX PO) Take 1 tablet by mouth daily.   . cholecalciferol (VITAMIN D) 1000 units tablet Take 1,000 Units by mouth daily.  . clonazePAM (KLONOPIN)  1 MG tablet Take 1 tablet (1 mg total) by mouth at bedtime as needed for anxiety. (Patient taking differently: Take 1 mg by mouth at bedtime. )  . esomeprazole (NEXIUM) 20 MG capsule Take 20 mg by mouth daily before breakfast.  . fluticasone (FLONASE) 50 MCG/ACT nasal spray Place into both nostrils daily.  . Guaifenesin (MUCINEX MAXIMUM STRENGTH) 1200 MG TB12 Take 1 tablet (1,200 mg total) by mouth every 12 (twelve) hours as needed.  Marland Kitchen Hyoscyamine Sulfate SL (LEVSIN/SL) 0.125 MG SUBL Place 0.125 mg under the tongue  4 (four) times daily.  Marland Kitchen levothyroxine (SYNTHROID, LEVOTHROID) 25 MCG tablet Take 75 mcg by mouth daily before breakfast.   . Magnesium 300 MG CAPS Take 300 mg by mouth daily.  Marland Kitchen METOPROLOL SUCCINATE ER PO Take 25 mg by mouth daily.   Marland Kitchen saccharomyces boulardii (FLORASTOR) 250 MG capsule Take 1 capsule (250 mg total) by mouth 2 (two) times daily.  . sucralfate (CARAFATE) 1 G tablet Take 1 g by mouth daily.  Marland Kitchen tiZANidine (ZANAFLEX) 2 MG tablet Take 2 mg by mouth 2 (two) times daily. Reported on 04/05/2015  . traMADol (ULTRAM) 50 MG tablet Take by mouth every 6 (six) hours as needed.  . tretinoin (RETIN-A) 0.025 % cream Apply topically at bedtime.  . vitamin E 400 UNIT capsule Take 400 Units by mouth every other day. Reported on 04/05/2015  . zinc gluconate 50 MG tablet Take 100 mg by mouth daily.      Allergies:   Epinephrine; Nsaids; Other; Pregabalin; Tolmetin; Prednisone; Tape; Tapentadol; Captopril; Lisinopril; Caine-1 [lidocaine hcl]; Celecoxib; Codeine; Lidocaine hcl; Procaine; Sulfa antibiotics; and Sulfasalazine   Social History   Socioeconomic History  . Marital status: Widowed    Spouse name: None  . Number of children: None  . Years of education: 3  . Highest education level: None  Social Needs  . Financial resource strain: None  . Food insecurity - worry: None  . Food insecurity - inability: None  . Transportation needs - medical: None  . Transportation needs -  non-medical: None  Occupational History  . 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    Employer: [employer]  Tobacco Use  . Smoking status: Never Smoker  . Smokeless tobacco: Never Used  Substance and Sexual Activity  . Alcohol use: No  . Drug use: No  . Sexual activity: Yes    Partners: Male    Birth control/protection: None  Other Topics Concern  . 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 Married '71- '10/widowed; engaged. 1 dtr- '72; 1 son '73; 4 grandchildren, 2 step-grands. 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      Family History: The patient's family history includes Aortic aneurysm in her mother; COPD in her mother; Cancer in her father; Diabetes in her brother, father, and mother; Heart attack in her father; Heart disease in her father; Hyperlipidemia in her father; Hypertension in her father and mother; Hypothyroidism in her mother. ROS:  Leg swelling, visual distortion, cough, chronic back pain, balance problem Please see the history of present illness.    All other systems reviewed and are negative.  EKGs/Labs/Other Studies Reviewed:    The following studies were reviewed today:  EKG:  EKG ordered today.  The ekg ordered today demonstrates [facility] and is normal.  Recent Labs: 07/25/2016: NT-Pro BNP 119 10/22/2016: ALT 92 10/23/2016: TSH 3.794 10/26/2016: BUN 10; Creatinine, Ser 0.79; Hemoglobin 13.0; Magnesium 1.4; Platelets 274; Potassium 3.4; Sodium 139  Recent Lipid Panel    Component Value Date/Time   CHOL 189 12/07/2011 1252   TRIG 127.0 12/07/2011 1252   HDL 49.30 12/07/2011 1252   CHOLHDL 4 12/07/2011 1252   VLDL 25.4 12/07/2011 1252   LDLCALC 114 (H) 12/07/2011 1252    Physical Exam:    VS:  BP 112/64   Pulse 84   Ht 5\' 5"  (1.651 m)   Wt 221 lb (100.2 kg)   SpO2 99%   BMI 36.78 kg/m  Wt Readings from Last 3 Encounters:  03/16/17 221 lb (100.2 kg)    10/21/16 218 lb (98.9 kg)  10/19/16 200 lb (90.7 kg)     GEN:  Well nourished, well developed in no acute distress HEENT: Normal NECK: No JVD; No carotid bruits LYMPHATICS: No lymphadenopathy CARDIAC: RRR, no murmurs, rubs, gallops RESPIRATORY:  Clear to auscultation without rales, wheezing or rhonchi  ABDOMEN: Soft, non-tender, non-distended MUSCULOSKELETAL:  No edema; No deformity  SKIN: Warm and dry NEUROLOGIC:  Alert and oriented x 3 PSYCHIATRIC:  Normal affect    Signed, Shirlee More, MD  03/16/2017 3:07 PM    [facility]

## 2017-03-16 NOTE — Patient Instructions (Signed)
Medication Instructions:  Your physician recommends that you continue on your current medications as directed. Please refer to the Current Medication list given to you today.   Labwork: None  Testing/Procedures: You had an EKG today.  Follow-Up: Your physician wants you to follow-up in: 1 year. You will receive a reminder letter in the mail two months in advance. If you don't receive a letter, please call our office to schedule the follow-up appointment.   Any Other Special Instructions Will Be Listed Below (If Applicable).     If you need a refill on your cardiac medications before your next appointment, please call your pharmacy.   

## 2017-03-21 ENCOUNTER — Ambulatory Visit
Admission: RE | Admit: 2017-03-21 | Discharge: 2017-03-21 | Disposition: A | Discharge status: Home / Self Care | Payer: Medicare Other | Source: Ambulatory Visit | Attending: Family Medicine | Admitting: Family Medicine

## 2017-03-21 ENCOUNTER — Other Ambulatory Visit: Payer: Self-pay | Admitting: Family Medicine

## 2017-03-21 DIAGNOSIS — R059 Cough, unspecified: Secondary | ICD-10-CM

## 2017-03-21 DIAGNOSIS — R05 Cough: Secondary | ICD-10-CM | POA: Diagnosis not present

## 2017-03-21 DIAGNOSIS — R197 Diarrhea, unspecified: Secondary | ICD-10-CM | POA: Diagnosis not present

## 2017-03-21 DIAGNOSIS — J45909 Unspecified asthma, uncomplicated: Secondary | ICD-10-CM | POA: Diagnosis not present

## 2017-03-28 ENCOUNTER — Encounter (HOSPITAL_BASED_OUTPATIENT_CLINIC_OR_DEPARTMENT_OTHER): Payer: Self-pay

## 2017-03-28 ENCOUNTER — Other Ambulatory Visit: Payer: Self-pay

## 2017-03-28 ENCOUNTER — Emergency Department (HOSPITAL_BASED_OUTPATIENT_CLINIC_OR_DEPARTMENT_OTHER)
Admission: EM | Admit: 2017-03-28 | Discharge: 2017-03-28 | Disposition: A | Discharge status: Home / Self Care | Payer: Medicare Other | Attending: Physician Assistant | Admitting: Physician Assistant

## 2017-03-28 ENCOUNTER — Emergency Department (HOSPITAL_BASED_OUTPATIENT_CLINIC_OR_DEPARTMENT_OTHER): Payer: Medicare Other

## 2017-03-28 DIAGNOSIS — I129 Hypertensive chronic kidney disease with stage 1 through stage 4 chronic kidney disease, or unspecified chronic kidney disease: Secondary | ICD-10-CM | POA: Insufficient documentation

## 2017-03-28 DIAGNOSIS — Z79899 Other long term (current) drug therapy: Secondary | ICD-10-CM | POA: Diagnosis not present

## 2017-03-28 DIAGNOSIS — N189 Chronic kidney disease, unspecified: Secondary | ICD-10-CM | POA: Insufficient documentation

## 2017-03-28 DIAGNOSIS — K760 Fatty (change of) liver, not elsewhere classified: Secondary | ICD-10-CM | POA: Diagnosis not present

## 2017-03-28 DIAGNOSIS — R1031 Right lower quadrant pain: Secondary | ICD-10-CM | POA: Diagnosis not present

## 2017-03-28 DIAGNOSIS — R197 Diarrhea, unspecified: Secondary | ICD-10-CM

## 2017-03-28 DIAGNOSIS — N3090 Cystitis, unspecified without hematuria: Secondary | ICD-10-CM | POA: Diagnosis not present

## 2017-03-28 DIAGNOSIS — K5900 Constipation, unspecified: Secondary | ICD-10-CM | POA: Diagnosis not present

## 2017-03-28 DIAGNOSIS — R1030 Lower abdominal pain, unspecified: Secondary | ICD-10-CM

## 2017-03-28 DIAGNOSIS — Z7982 Long term (current) use of aspirin: Secondary | ICD-10-CM | POA: Diagnosis not present

## 2017-03-28 LAB — COMPREHENSIVE METABOLIC PANEL
ALT: 70 U/L — ABNORMAL HIGH (ref 14–54)
AST: 34 U/L (ref 15–41)
Albumin: 3.7 g/dL (ref 3.5–5.0)
Alkaline Phosphatase: 123 U/L (ref 38–126)
Anion gap: 9 (ref 5–15)
BUN: 14 mg/dL (ref 6–20)
CO2: 27 mmol/L (ref 22–32)
Calcium: 9.2 mg/dL (ref 8.9–10.3)
Chloride: 101 mmol/L (ref 101–111)
Creatinine, Ser: 0.85 mg/dL (ref 0.44–1.00)
GFR calc Af Amer: >60 mL/min (ref >60)
GFR calc non Af Amer: >60 mL/min (ref >60)
Glucose, Bld: 104 mg/dL — ABNORMAL HIGH (ref 65–99)
Potassium: 4.5 mmol/L (ref 3.5–5.1)
Sodium: 137 mmol/L (ref 135–145)
Total Bilirubin: 0.2 mg/dL — ABNORMAL LOW (ref 0.3–1.2)
Total Protein: 8.2 g/dL — ABNORMAL HIGH (ref 6.5–8.1)

## 2017-03-28 LAB — LIPASE, BLOOD: Lipase: 25 U/L (ref 11–51)

## 2017-03-28 LAB — URINALYSIS, ROUTINE W REFLEX MICROSCOPIC
Bilirubin Urine: NEGATIVE
Glucose, UA: NEGATIVE mg/dL
Ketones, ur: NEGATIVE mg/dL
Nitrite: POSITIVE — AB
Protein, ur: NEGATIVE mg/dL
Specific Gravity, Urine: 1.02 (ref 1.005–1.030)
pH: 6 (ref 5.0–8.0)

## 2017-03-28 LAB — CBC
HCT: 41.1 % (ref 36.0–46.0)
Hemoglobin: 13.5 g/dL (ref 12.0–15.0)
MCH: 29.5 pg (ref 26.0–34.0)
MCHC: 32.8 g/dL (ref 30.0–36.0)
MCV: 89.7 fL (ref 78.0–100.0)
Platelets: 364 10*3/uL (ref 150–400)
RBC: 4.58 MIL/uL (ref 3.87–5.11)
RDW: 13.7 % (ref 11.5–15.5)
WBC: 9.4 10*3/uL (ref 4.0–10.5)

## 2017-03-28 LAB — URINALYSIS, MICROSCOPIC (REFLEX)

## 2017-03-28 MED ORDER — ONDANSETRON HCL 4 MG/2ML IJ SOLN: 4.0000 mg | Freq: Once | INTRAMUSCULAR | Status: DC

## 2017-03-28 MED ORDER — MORPHINE SULFATE (PF) 4 MG/ML IV SOLN: 4.0000 mg | Freq: Once | INTRAVENOUS | Status: DC

## 2017-03-28 MED ORDER — SODIUM CHLORIDE 0.9 % IV BOLUS (SEPSIS)
1000.0000 mL | Freq: Once | INTRAVENOUS | Status: AC
  Administered 2017-03-28: 1000 mL via INTRAVENOUS

## 2017-03-28 MED ORDER — IOPAMIDOL (ISOVUE-300) INJECTION 61%
100.0000 mL | Freq: Once | INTRAVENOUS | Status: AC | PRN
  Administered 2017-03-28: 100 mL via INTRAVENOUS

## 2017-03-28 MED ORDER — CEPHALEXIN 500 MG PO CAPS: 500.0000 mg | ORAL_CAPSULE | Freq: Two times a day (BID) | ORAL | 0 refills | Status: DC

## 2017-03-28 MED ORDER — TRAMADOL HCL 50 MG PO TABS: 50.0000 mg | ORAL_TABLET | Freq: Four times a day (QID) | ORAL | 0 refills | Status: DC | PRN

## 2017-03-28 NOTE — ED Triage Notes (Signed)
C/o RLQ pain, fever-pt with abd pain since last Dec salmonella-pt also with recent URI and cough-NAD-presents to triage in w/c

## 2017-03-28 NOTE — ED Provider Notes (Signed)
[facility] EMERGENCY DEPARTMENT Provider Note   CSN: [id number] Arrival date & time: 03/28/17  1122     History   Chief Complaint Chief Complaint  Patient presents with  . Abdominal Pain    HPI [name] is a 67 y.o. female.  HPI 67 year old Caucasian female with a significant list of past medical history as seen below presents to the emergency department today for evaluation of intermittent diarrhea, generalized abdominal pain and intermittent fevers.  The patient states that approximately 1 month ago she was seen by her primary care doctor for nasal congestion and sinus pressure.  Patient was diagnosed with sinusitis and started on 2 weeks of amoxicillin.  Patient states that after she finished the amoxicillin she was not having any improvement in her symptoms.  She states that her primary care doctor then put her on 2 weeks of Augmentin.  Patient states that she stopped taking the Augmentin approximately 2 weeks ago.  States that she still had no improvement in her sinus congestion and start developing a cough and fevers.  Patient was seen by her primary care doctor 2 weeks ago where she had chest x-ray at that time along with a steroid injection.  She was noted to have bronchitis.  Was treated symptomatically.  The patient states that since taking the antibiotic she has had intermittent diarrhea.  She also reports some generalized abdominal cramping.  The patient states that she does have a history of Salmonella infection in August 2018.  She was hospitalized at that time.  Patient is followed by [facility].  The patient denies any blood in her stools today.  Denies any diarrhea today.  She states that her last bowel movement was yesterday and it was normal.  She still reports some abdominal cramping that she rates as 6 out of 10.  Does not radiate.  It is more generalized.  Nothing makes better or worse.  She also reports some urinary urgency and frequency but denies  any hematuria or dysuria.  Patient denies any associated vaginal bleeding or discharge.  She reports intermittent fevers that is related to her sinus infection which is improving finally.  She has not taking for her pain prior to arrival.  Patient reports history of removal of her right ovary and appendectomy.  Pt denies any fever, chill, ha, vision changes, lightheadedness, dizziness, neck pain, cp, sob, cough, abd pain, n/v/d, melena, hematochezia, lower extremity paresthesias.  Past Medical History:  Diagnosis Date  . Allergy   . Anemia, iron deficiency 01/22/2011   May '12 - 13.8 Hgb, Nov 2, '12 Hgb 10.9   . Arthritis   . 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 CAP (community acquired pneumonia) 10/21/2016  . Chest pain in adult 07/08/2010   March 5, '09 negative cardiolite Sept 28, '10 negative nuclear stress (cardiolite) Dec 21, '11  Negative nuclear stress [facility]ling) June 26, '14 Negative nuclear stress - [place] with Dr. Bettina Gavia per patient report.  Overview:  CAD risk score is low at 3%  . Chondromalacia patellae 12/02/2012  . Chronic back pain   . Chronic kidney disease   . Chronic knee pain   . Chronic pain 09/22/2010   Multiple sources of pain: radicular pain from lumbar disease and post-operative pain; neck pain from cervical disk disease; left knee pain from trauma.  Plan - will wean off prednisone           Will continue percocet at 1  5/325 norco every 6 hours prn  NSAID - trial of meloxicam 46m g once a day           Increase gabapentin slowly to 600mg  tid and reassess           Executed a pain con  . Chronic venous insufficiency 10/10/2013  . Colitis 10/21/2016  . Collagen vascular disease ([place])   . Deformity of joint 04/23/2013  . Depression with anxiety 09/21/2010  . Derangement of knee, left 2009   started with fall at work. Has had repair patella and torn meniscus  . DJD (degenerative joint disease) of knee 09/02/2012  . Essential hypertension 07/08/2010    Long standing hypertension medically managed. Currently seeing Dr. Daneen Schick and Dr. Marin Roberts ([facility]) for cardiology.  Meds  HCT only  Overview:  Overview:  Long standing hypertension medically managed. Currently seeing Dr. Daneen Schick and Dr. Bettina Gavia ([facility]) for cardiology.  Meds  HCT only  Last Assessment & Plan:  Formatting of this note may be different from the original. BP Readings  . Fever chills 10/21/2016  . Fibromyalgia 02/27/2017  . High cholesterol   . Hx of appendectomy   . Hypertension   . Knee internal derangement 07/08/2010   Left knee: Feb '09 medial and lateral meniscal tears by MRI                  April '09 arthroscopic surgery   . Melanoma ([place]) 11/19/2015  . Migraine headache    hormonal related - less frequent off hormone replacement  . Migraines   . Neuropathy 04/23/2013  . Peripheral cyanosis 09/28/2012   Full evaluation by Dr. Tobie Lords for rheumatology - no evidence of Raynaud's syndrome or disease; no evidence of underlying rheumatologic disease or malignancy. Most probable cause is neurogenic.   . Pseudoarthrosis of lumbar spine 01/02/2011  . Psoriasis 04/23/2013  . Raynaud disease   . Routine health maintenance 08/17/2011   Colonoscopy - no record in EPIC of colonoscopy  Immunizations - Tdap June '13.    . Salmonella enteritis 10/23/2016  . Sepsis ([place]) 10/23/2016  . Spinal stenosis 08/14/2010   history of cervical laminectomy, lumbar diskectomy with fixation devices and redo surgery.   . Status post lumbar spinal fusion 10/02/2011  . SVT (supraventricular tachycardia) ([place])   . Synovial cyst of lumbar facet joint    lower back - s/p surgery   . Tear of lateral meniscus of left knee 09/30/2012    Patient Active Problem List   Diagnosis Date Noted  . Fibromyalgia 02/27/2017  . Sepsis ([place]) 10/23/2016  . Salmonella enteritis 10/23/2016  . Colitis 10/21/2016  . CAP (community acquired pneumonia) 10/21/2016  . Fever chills 10/21/2016  . Melanoma ([place])  11/19/2015  . SVT (supraventricular tachycardia) ([place]) 05/20/2015  . Chronic venous insufficiency 10/10/2013  . Neuropathy 04/23/2013  . Psoriasis 04/23/2013  . Deformity of joint 04/23/2013  . 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 Chondromalacia patellae 12/02/2012  . Peripheral cyanosis 09/28/2012  . Status post lumbar spinal fusion 10/02/2011  . Routine health maintenance 08/17/2011  . Anemia, iron deficiency 01/22/2011  . Chronic back pain 01/02/2011  . Pseudoarthrosis of lumbar spine 01/02/2011  . Chronic pain 09/22/2010  . Depression with anxiety 09/21/2010  . Spinal stenosis 08/14/2010  . Hypertensive chronic kidney disease 07/08/2010  . Migraine 07/08/2010  . Knee internal derangement 07/08/2010  . Chest pain in adult 07/08/2010    Past Surgical History:  Procedure Laterality Date  . APPENDECTOMY  1971  . CERVICAL DISCECTOMY  1999  diskectomy with fixation:plate and screws  . COLONOSCOPY    . ESOPHAGOGASTRODUODENOSCOPY  12/2014  . KNEE ARTHROSCOPY W/ MENISCAL REPAIR  09   left knee  . L4-5 lumbar fusion  September 02, 2010   Dr. Hal Neer:  minimally invasive transforaminal interbody fusion with spacer  . OVARIAN CYST SURGERY  1972  . PATELLAR REEFING  '09   repair of fractured patella after fall  . SPINE SURGERY    . SYNOVIAL CYST EXCISION  2007   lumbar spine  . VENOUS ABLATION     for pain in the groin - V[facility]TS did procedure. Has some residual discomfort at the  ablation site.   . WISDOM TOOTH EXTRACTION      OB History    No data available       Home Medications    Prior to Admission medications   Medication Sig Start Date End Date Taking? Authorizing Provider  acetaminophen (TYLENOL) 500 MG tablet Take 1,000 mg by mouth every 6 (six) hours as needed for mild pain, moderate pain or headache.     [provider]  amitriptyline (ELAVIL) 10 MG tablet Take 4 tablets (40 mg total) by mouth at bedtime. Patient taking differently: Take 20 mg by  mouth at bedtime.     Hendricks Limes, MD  amoxicillin-clavulanate (AUGMENTIN) [phone] MG tablet  03/12/17   [provider]  aspirin EC 81 MG tablet Take 81 mg by mouth every other day.     [provider]  B Complex Vitamins (VITAMIN B COMPLEX PO) Take 1 tablet by mouth daily.     [provider]  cephALEXin (KEFLEX) 500 MG capsule Take 1 capsule (500 mg total) by mouth 2 (two) times daily. 03/28/17   Doristine Devoid, PA-C  cholecalciferol (VITAMIN D) 1000 units tablet Take 1,000 Units by mouth daily.    [provider]  clonazePAM (KLONOPIN) 1 MG tablet Take 1 tablet (1 mg total) by mouth at bedtime as needed for anxiety. Patient taking differently: Take 1 mg by mouth at bedtime.     Norins, Heinz Knuckles, MD  esomeprazole (NEXIUM) 20 MG capsule Take 20 mg by mouth daily before breakfast.    [provider]  fluticasone (FLONASE) 50 MCG/ACT nasal spray Place into both nostrils daily.    [provider]  Guaifenesin (MUCINEX MAXIMUM STRENGTH) 1200 MG TB12 Take 1 tablet (1,200 mg total) by mouth every 12 (twelve) hours as needed. 07/25/16   Ivar Drape D, PA  Hyoscyamine Sulfate SL (LEVSIN/SL) 0.125 MG SUBL Place 0.125 mg under the tongue 4 (four) times daily. 04/12/16   Ivar Drape D, PA  levothyroxine (SYNTHROID, LEVOTHROID) 25 MCG tablet Take 75 mcg by mouth daily before breakfast.     [provider]  Magnesium 300 MG CAPS Take 300 mg by mouth daily.    [provider]  metoprolol succinate (TOPROL-XL) 25 MG 24 hr tablet Take 1 tablet (25 mg total) by mouth daily. 03/16/17   Richardo Priest, MD  saccharomyces boulardii (FLORASTOR) 250 MG capsule Take 1 capsule (250 mg total) by mouth 2 (two) times daily. 10/26/16   Lavina Hamman, MD  sucralfate (CARAFATE) 1 G tablet Take 1 g by mouth daily. 09/03/14   [provider]  tiZANidine (ZANAFLEX) 2 MG tablet Take 2 mg by mouth 2 (two) times daily. Reported on  04/05/2015    [provider]  traMADol (ULTRAM) 50 MG tablet Take 1 tablet (50 mg total) by mouth  every 6 (six) hours as needed. 03/28/17   Doristine Devoid, PA-C  tretinoin (RETIN-A) 0.025 % cream Apply topically at bedtime.    [provider]  vitamin E 400 UNIT capsule Take 400 Units by mouth every other day. Reported on 04/05/2015    [provider]  zinc gluconate 50 MG tablet Take 100 mg by mouth daily.     [provider]    Family History Family History  Problem Relation Age of Onset  . Diabetes Mother   . COPD Mother   . Aortic aneurysm Mother   . Hypertension Mother   . Hypothyroidism Mother   . Cancer Father        lung  . Hyperlipidemia Father   . Hypertension Father   . Heart disease Father   . Diabetes Father   . Heart attack Father   . Diabetes Brother     Social History Social History   Tobacco Use  . Smoking status: Never Smoker  . Smokeless tobacco: Never Used  Substance Use Topics  . Alcohol use: No  . Drug use: No     Allergies   Epinephrine; Nsaids; Other; Pregabalin; Tolmetin; Prednisone; Tape; Tapentadol; Captopril; Lisinopril; Caine-1 [lidocaine hcl]; Celecoxib; Codeine; Lidocaine hcl; Procaine; Sulfa antibiotics; and Sulfasalazine   Review of Systems Review of Systems  Constitutional: Positive for fever (subjective). Negative for chills and diaphoresis.  HENT: Positive for congestion.   Eyes: Negative for visual disturbance.  Respiratory: Negative for cough and shortness of breath.   Cardiovascular: Negative for chest pain, palpitations and leg swelling.  Gastrointestinal: Positive for abdominal pain, constipation and diarrhea. Negative for blood in stool, nausea and vomiting.  Genitourinary: Positive for frequency and urgency. Negative for dysuria, flank pain and hematuria.  Musculoskeletal: Negative for arthralgias and myalgias.  Skin: Negative for rash.  Neurological: Negative for dizziness, syncope,  weakness, light-headedness, numbness and headaches.  Psychiatric/Behavioral: Negative for sleep disturbance. The patient is not nervous/anxious.      Physical Exam Updated Vital Signs BP (!) 154/71 (BP Location: Right Arm)   Pulse 84   Temp 99.3 F (37.4 C) (Oral)   Resp 16   Ht 5\' 5"  (1.651 m)   Wt 98.4 kg (217 lb)   SpO2 99%   BMI 36.11 kg/m   Physical Exam  Constitutional: She is oriented to person, place, and time. She appears well-developed and well-nourished.  Non-toxic appearance. No distress.  HENT:  Head: Normocephalic and atraumatic.  Nose: Nose normal.  Mouth/Throat: Oropharynx is clear and moist.  Eyes: Conjunctivae are normal. Pupils are equal, round, and reactive to light. Right eye exhibits no discharge. Left eye exhibits no discharge.  Neck: Normal range of motion. Neck supple.  Cardiovascular: Normal rate, regular rhythm, normal heart sounds and intact distal pulses. Exam reveals no gallop and no friction rub.  No murmur heard. Pulmonary/Chest: Effort normal and breath sounds normal. No stridor. No respiratory distress. She has no wheezes. She has no rales. She exhibits no tenderness.  Abdominal: Soft. Bowel sounds are normal. There is generalized tenderness and tenderness in the right lower quadrant, suprapubic area and left lower quadrant. There is no rigidity, no rebound, no guarding and no CVA tenderness.  No CVA tenderness.  Musculoskeletal: Normal range of motion. She exhibits no tenderness.  Lymphadenopathy:    She has no cervical adenopathy.  Neurological: She is alert and oriented to person, place, and time.  Skin: Skin is warm and dry. Capillary refill takes less than 2  seconds.  No skin turgor   Psychiatric: Her behavior is normal. Judgment and thought content normal.  Nursing note and vitals reviewed.    ED Treatments / Results  Labs (all labs ordered are listed, but only abnormal results are displayed) Labs Reviewed  COMPREHENSIVE METABOLIC  PANEL - Abnormal; Notable for the following components:      Result Value   Glucose, Bld 104 (*)    Total Protein 8.2 (*)    ALT 70 (*)    Total Bilirubin 0.2 (*)    All other components within normal limits  URINALYSIS, ROUTINE W REFLEX MICROSCOPIC - Abnormal; Notable for the following components:   APPearance HAZY (*)    Hgb urine dipstick TRACE (*)    Nitrite POSITIVE (*)    Leukocytes, UA SMALL (*)    All other components within normal limits  URINALYSIS, MICROSCOPIC (REFLEX) - Abnormal; Notable for the following components:   Bacteria, UA MANY (*)    Squamous Epithelial / LPF 0-5 (*)    All other components within normal limits  URINE CULTURE  LIPASE, BLOOD  CBC    EKG  EKG Interpretation None       Radiology Ct Abdomen Pelvis W Contrast  Result Date: 03/28/2017 CLINICAL DATA:  Lower abdominal pain for 2 weeks with constipation and diarrhea. EXAM: CT ABDOMEN AND PELVIS WITH CONTRAST TECHNIQUE: Multidetector CT imaging of the abdomen and pelvis was performed using the standard protocol following bolus administration of intravenous contrast. CONTRAST:  162mL ISOVUE-300 IOPAMIDOL (ISOVUE-300) INJECTION 61% COMPARISON:  10/21/2016. FINDINGS: Lower chest: Lung bases show no acute findings. Heart is at the upper limits of normal in size. No pericardial or pleural effusion. Hepatobiliary: Liver is heterogeneous in attenuation. Liver and gallbladder are otherwise unremarkable. No biliary ductal dilatation. Pancreas: Negative. Spleen: Negative. Adrenals/Urinary Tract: Adrenal glands and kidneys are unremarkable. Ureters are decompressed. Bladder is grossly unremarkable. Stomach/Bowel: Stomach, small bowel and colon are unremarkable. Appendix is not confidently visualized. Vascular/Lymphatic: Atherosclerotic calcification of the arterial vasculature without abdominal aortic aneurysm. No pathologically enlarged lymph nodes. Reproductive: Uterus is visualized.  No adnexal mass. Other: No  free fluid.  Mesenteries and peritoneum are unremarkable. Musculoskeletal: No worrisome lytic or sclerotic lesions. Bone island in the left iliac wing. Postoperative changes in the lumbar spine. IMPRESSION: 1. No findings to explain the patient's clinical history. 2. Hepatic steatosis. 3.  Aortic atherosclerosis (ICD10-170.0). Electronically Signed   By: Lorin Picket M.D.   On: 03/28/2017 15:47    Procedures Procedures (including critical care time)  Medications Ordered in ED Medications  morphine 4 MG/ML injection 4 mg (4 mg Intravenous Refused 03/28/17 1421)  ondansetron (ZOFRAN) injection 4 mg (4 mg Intravenous Refused 03/28/17 1421)  sodium chloride 0.9 % bolus 1,000 mL (0 mLs Intravenous Stopped 03/28/17 1607)  iopamidol (ISOVUE-300) 61 % injection 100 mL (100 mLs Intravenous Contrast Given 03/28/17 1525)     Initial Impression / Assessment and Plan / ED Course  I have reviewed the triage vital signs and the nursing notes.  Pertinent labs & imaging results that were available during my care of the patient were reviewed by me and considered in my medical decision making (see chart for details).     She presents to the ED with complaints of intermittent diarrhea, diffuse abdominal pain and subjective fevers associated URI symptoms.  Patient has been on amoxicillin and Augmentin over the past month for sinusitis which has finally started to improve after having a steroid injection last week by  her primary care doctor.  Reports history of Salmonella infection approximately 5 months ago.  Patient is followed by GI.  She reports intermittent diarrhea with the antibiotics.  Denies any associated bloody stools.  Denies any associated emesis or nausea.  Patient reports some urinary urgency and frequency but denies any hematuria or dysuria.  Patient is overall well-appearing and nontoxic.  Vital signs are reassuring.  Patient is afebrile in the ED.  Patient has no signs of sepsis.  Patient has  minimal diffuse abdominal tenderness to palpation.  No signs of peritonitis.  Lungs clear to auscultation bilaterally.  No signs of significant dehydration on exam.  Initial lab work was obtained.  No leukocytosis.  Electrolytes are reassuring.  Lipase is normal.  Patient has an improving elevation in her ALT.  Bilirubin is normal.  UA does show signs of infection which is nitrite positive, leukocytes, many bacteria.  Patient reports urinary urgency or frequency.  Patient's urine was cultured.  CT scan was obtained to rule out any intra-abdominal pathology including colitis, diverticulitis, pyelonephritis.  IMPRESSION: 1. No findings to explain the patient's clinical history. 2. Hepatic steatosis. 3.  Aortic atherosclerosis    Patient CT scan is reassuring.  Patient refused any pain medicine or nausea medicine in the ED.  Patient given fluid bolus.  Patient has been unable to provide any stool sample for culturing at this time.  I have low suspicion for C. difficile given that patient has not had any episodes of diarrhea today.  We will treat patient for urinary tract infection with Keflex.  Have given small course of pain medication.  Encouraged patient to follow-up with her primary care doctor and GI doctor.  Instructed patient to start taking a probiotic with your antibiotic to prevent worsening diarrhea.  Patient is able to tolerate p.o. fluids in the ED.  Has no signs of significant dehydration.  Vital signs remained reassuring.  Pt is hemodynamically stable, in NAD, & able to ambulate in the ED. Evaluation does not show pathology that would require ongoing emergent intervention or inpatient treatment. I explained the diagnosis to the patient. Pain has been managed & has no complaints prior to dc. Pt is comfortable with above plan and is stable for discharge at this time. All questions were answered prior to disposition. Strict return precautions for f/u to the ED were discussed. Encouraged follow  up with PCP.  Diucussed with my attending who is agreeable with the above plan.    Final Clinical Impressions(s) / ED Diagnoses   Final diagnoses:  Diarrhea, unspecified type  Lower abdominal pain  Cystitis    ED Discharge Orders        Ordered    cephALEXin (KEFLEX) 500 MG capsule  2 times daily     03/28/17 1554    traMADol (ULTRAM) 50 MG tablet  Every 6 hours PRN     03/28/17 1554       Doristine Devoid, PA-C 03/28/17 1755    [place], Fredia Sorrow, MD 03/30/17 0005

## 2017-03-28 NOTE — Discharge Instructions (Signed)
Your lab work and imaging has been reassuring.  No signs on your CAT scan will be causing her symptoms.  This may be related to a urinary tract infection.  We will treat you with an antibiotic called Keflex.  Have given you a short course of pain medication as well.  It is very important that you follow-up with your primary care doctor and your GI doctor.  Return to the ED with any worsening symptoms.

## 2017-03-30 LAB — URINE CULTURE: Culture: >=100000 — AB

## 2017-03-31 ENCOUNTER — Telehealth: Payer: Self-pay

## 2017-03-31 NOTE — Telephone Encounter (Signed)
Post ED Visit - Positive Culture Follow-up  Culture report reviewed by antimicrobial stewardship pharmacist:  []  Elenor Quinones, Pharm.D. []  Heide Guile, Pharm.D., BCPS AQ-ID []  Parks Neptune, Pharm.D., BCPS []  Alycia Rossetti, Pharm.D., BCPS []  [place], Pharm.D., BCPS, AAHIVP []  Legrand Como, Pharm.D., BCPS, AAHIVP []  Salome Arnt, PharmD, BCPS []  Jalene Mullet, PharmD []  Vincenza Hews, PharmD, BCPS [facility] Pharm D Positive urine culture Treated with Cephalexin, organism sensitive to the same and no further patient follow-up is required at this time.  Genia Del 03/31/2017, 9:26 AM

## 2017-04-03 DIAGNOSIS — M542 Cervicalgia: Secondary | ICD-10-CM | POA: Diagnosis not present

## 2017-04-03 DIAGNOSIS — M4716 Other spondylosis with myelopathy, lumbar region: Secondary | ICD-10-CM | POA: Diagnosis not present

## 2017-04-03 DIAGNOSIS — M961 Postlaminectomy syndrome, not elsewhere classified: Secondary | ICD-10-CM | POA: Diagnosis not present

## 2017-04-03 DIAGNOSIS — M7062 Trochanteric bursitis, left hip: Secondary | ICD-10-CM | POA: Diagnosis not present

## 2017-04-13 ENCOUNTER — Other Ambulatory Visit: Payer: Self-pay | Admitting: Family Medicine

## 2017-04-13 ENCOUNTER — Ambulatory Visit
Admission: RE | Admit: 2017-04-13 | Discharge: 2017-04-13 | Disposition: A | Discharge status: Home / Self Care | Payer: Medicare Other | Source: Ambulatory Visit | Attending: Family Medicine | Admitting: Family Medicine

## 2017-04-13 DIAGNOSIS — R058 Other specified cough: Secondary | ICD-10-CM

## 2017-04-13 DIAGNOSIS — R05 Cough: Secondary | ICD-10-CM

## 2017-04-13 DIAGNOSIS — J45909 Unspecified asthma, uncomplicated: Secondary | ICD-10-CM | POA: Diagnosis not present

## 2017-04-17 DIAGNOSIS — J45909 Unspecified asthma, uncomplicated: Secondary | ICD-10-CM | POA: Diagnosis not present

## 2017-04-26 ENCOUNTER — Encounter: Payer: Self-pay | Admitting: Internal Medicine

## 2017-04-26 ENCOUNTER — Other Ambulatory Visit (INDEPENDENT_AMBULATORY_CARE_PROVIDER_SITE_OTHER): Payer: Medicare Other

## 2017-04-26 ENCOUNTER — Ambulatory Visit (HOSPITAL_COMMUNITY)
Admission: RE | Admit: 2017-04-26 | Discharge: 2017-04-26 | Disposition: A | Discharge status: Home / Self Care | Payer: Medicare Other | Source: Ambulatory Visit | Attending: Internal Medicine | Admitting: Internal Medicine

## 2017-04-26 ENCOUNTER — Ambulatory Visit (INDEPENDENT_AMBULATORY_CARE_PROVIDER_SITE_OTHER): Payer: Medicare Other | Admitting: Internal Medicine

## 2017-04-26 VITALS — BP 126/84 | HR 82 | Ht 65.0 in | Wt 220.0 lb

## 2017-04-26 DIAGNOSIS — R05 Cough: Secondary | ICD-10-CM

## 2017-04-26 DIAGNOSIS — R058 Other specified cough: Secondary | ICD-10-CM

## 2017-04-26 DIAGNOSIS — J320 Chronic maxillary sinusitis: Secondary | ICD-10-CM | POA: Diagnosis not present

## 2017-04-26 DIAGNOSIS — R059 Cough, unspecified: Secondary | ICD-10-CM

## 2017-04-26 LAB — CBC WITH DIFFERENTIAL/PLATELET
Basophils Absolute: 0.2 10*3/uL — ABNORMAL HIGH (ref 0.0–0.1)
Basophils Relative: 1.8 % (ref 0.0–3.0)
Eosinophils Absolute: 0.4 10*3/uL (ref 0.0–0.7)
Eosinophils Relative: 4 % (ref 0.0–5.0)
HCT: 42 % (ref 36.0–46.0)
Hemoglobin: 14.2 g/dL (ref 12.0–15.0)
Lymphocytes Relative: 15.1 % (ref 12.0–46.0)
Lymphs Abs: 1.5 10*3/uL (ref 0.7–4.0)
MCHC: 33.7 g/dL (ref 30.0–36.0)
MCV: 87.4 fl (ref 78.0–100.0)
Monocytes Absolute: 0.5 10*3/uL (ref 0.1–1.0)
Monocytes Relative: 4.9 % (ref 3.0–12.0)
Neutro Abs: 7.5 10*3/uL (ref 1.4–7.7)
Neutrophils Relative %: 74.2 % (ref 43.0–77.0)
Platelets: 309 10*3/uL (ref 150.0–400.0)
RBC: 4.81 Mil/uL (ref 3.87–5.11)
RDW: 14.1 % (ref 11.5–15.5)
WBC: 10.2 10*3/uL (ref 4.0–10.5)

## 2017-04-26 NOTE — Progress Notes (Signed)
Subjective:     Patient ID: [name], female   DOB: 1950-11-02,    MRN: [id number]  HPI   53 yowm never smoker healthy child / passive exp to parents cigs / ran track fine and no resp problems until   noted some drainage issues esp spring/summer in her 65's rx with sudafed but gradually worse and eval ENT 2014 [place] with lump in R neck > ct neck showed fat  Density only and then in 2017 really bad nose bleed which recurred Nov 2018 assoc with intermittent yellow discharge > cleared with just pcp rx with netti pot>  mostly clear secretioons but   had recurrence Mid Dember 2018  This time "went into ches" on amox/ pred/ but not better and cough to point of vomiting so referred to pulmonary clinic 04/26/2017 by Dr   Kelton Pillar    2002 neck sugery [place] went fine  2012 back surgery > complications and transferred to [facility] where she "stopped breathing post op" but fully recovered resp wise p d/c    04/26/2017 1st [facility] office visit/ Lucio Litsey   Chief Complaint  Patient presents with  . Pulmonary Consult    Referred by Dr. Kelton Pillar.  Pt c/o cough, SOB and hoarseness for the past 2-3 months. Her cough is prod with yellow sputum.  Her throat feels irritated and her tongue feels thick.  She states her cough is worse in the evenings and occ wakes her up in the night.   just finished 10 days of levaquin on day of ov/ nasal draingage clear and overall better  Cough had been daily since mid Dec 2018 worse before supper and immediately on lying down unless take [facility] now sleeping @ 30 degrees  - reported to respond to neb but says saba  inhaler didn't help but made her heart race / flonase ? Helping not sure  Follows with [facility] for gerd maint on  carafate in am and nexium 20 in pm    No obvious day to day or daytime variability or assoc ongoing  excess/ purulent sputum or mucus plugs or hemoptysis or cp or chest tightness, subjective wheeze or overt sinus or hb symptoms. No  unusual exposure hx or h/o childhood pna/ asthma or knowledge of premature birth.  Sleeping ok ast 30 degrees p HC  without nocturnal  or early am exacerbation  of respiratory  c/o's or need for noct saba. Also denies any obvious fluctuation of symptoms with weather or environmental changes or other aggravating or alleviating factors except as outlined above   Current Allergies, Complete Past Medical History, Past Surgical History, Family History, and Social History were reviewed in [employer] record.  ROS  The following are not active complaints unless bolded Hoarseness, sore throat, dysphagia, dental problems, itching, sneezing,  nasal congestion or discharge of excess mucus or purulent secretions, ear ache,   fever, chills, sweats, unintended wt loss or wt gain, classically pleuritic or exertional cp,  orthopnea pnd or leg swelling, presyncope, palpitations, abdominal pain, anorexia, nausea, vomiting, diarrhea  or change in bowel habits or change in bladder habits, change in stools or change in urine, dysuria, hematuria,  rash, arthralgias, visual complaints, headache, numbness, weakness or ataxia or problems with walking or coordination,  change in mood/affect or memory.        Current Meds  Medication Sig  . acetaminophen (TYLENOL) 500 MG tablet Take 1,000 mg by mouth every 6 (six) hours as needed for  mild pain, moderate pain or headache.   Marland Kitchen amitriptyline (ELAVIL) 10 MG tablet Take 10 mg by mouth at bedtime.  Marland Kitchen aspirin EC 81 MG tablet Take 81 mg by mouth daily.   . B Complex Vitamins (VITAMIN B COMPLEX PO) Take 1 tablet by mouth daily.   . cholecalciferol (VITAMIN D) 1000 units tablet Take 1,000 Units by mouth daily.  . clonazePAM (KLONOPIN) 1 MG tablet Take 1 tablet (1 mg total) by mouth at bedtime as needed for anxiety. (Patient taking differently: Take 1 mg by mouth at bedtime. )  . esomeprazole (NEXIUM) 20 MG capsule Take 20 mg by mouth daily before breakfast.  .  fluticasone (FLONASE) 50 MCG/ACT nasal spray Place into both nostrils daily.  . Guaifenesin (MUCINEX MAXIMUM STRENGTH) 1200 MG TB12 Take 1 tablet (1,200 mg total) by mouth every 12 (twelve) hours as needed.  Marland Kitchen HYDROcodone-homatropine (HYCODAN) 5-1.5 MG/5ML syrup 1 tsp every 12 hours as needed  . Hyoscyamine Sulfate SL (LEVSIN/SL) 0.125 MG SUBL Place 0.125 mg under the tongue 4 (four) times daily.  Marland Kitchen levothyroxine (SYNTHROID, LEVOTHROID) 25 MCG tablet Take 75 mcg by mouth daily before breakfast.   . Magnesium 300 MG CAPS Take 300 mg by mouth daily.  . metoprolol succinate (TOPROL-XL) 25 MG 24 hr tablet Take 1 tablet (25 mg total) by mouth daily.  . sucralfate (CARAFATE) 1 G tablet Take 1 g by mouth daily.  Marland Kitchen tiZANidine (ZANAFLEX) 2 MG tablet Take 2 mg by mouth 2 (two) times daily. Reported on 04/05/2015  . traMADol (ULTRAM) 50 MG tablet Take 1 tablet (50 mg total) by mouth every 6 (six) hours as needed.        Review of Systems     Objective:   Physical Exam    amb hoarse wf   Wt Readings from Last 3 Encounters:  04/26/17 220 lb (99.8 kg)  03/28/17 217 lb (98.4 kg)  03/16/17 221 lb (100.2 kg)     Vital signs reviewed - Note on arrival 02 sats  97% on  RA       HEENT: nl dentition, turbinates bilaterally, and oropharynx. Nl external ear canals without cough reflex   NECK :  without JVD/Nodes/TM/ nl carotid upstrokes bilaterally   LUNGS: no acc muscle use,  Nl contour chest which is clear to A and P bilaterally without cough on insp or exp maneuvers   CV:  RRR  no s3 or murmur or increase in P2, and no edema   ABD:  soft and nontender with nl inspiratory excursion in the supine position. No bruits or organomegaly appreciated, bowel sounds nl  MS:  Nl gait/ ext warm without deformities, calf tenderness, cyanosis or clubbing No obvious joint restrictions   SKIN: warm and dry without lesions    NEURO:  alert, approp, nl sensorium with  no motor or cerebellar deficits  apparent.       I personally reviewed images and agree with radiology impression as follows:  CXR:    04/13/17 No active cardiopulmonary disease.  Labs ordered 04/26/2017   Allergy profile    Assessment:

## 2017-04-26 NOTE — Patient Instructions (Addendum)
nexium 40 mg  30 min before lunch and  Pepcid ac  1 hour before bedtime  GERD (REFLUX)  is an extremely common cause of respiratory symptoms just like yours , many times with no obvious heartburn at all.    It can be treated with medication, but also with lifestyle changes including elevation of the head of your bed (ideally with 6 inch  bed blocks),  Smoking cessation, avoidance of late meals, excessive alcohol, and avoid fatty foods, chocolate, peppermint, colas, red wine, and acidic juices such as orange juice.  NO MINT OR MENTHOL PRODUCTS SO NO COUGH DROPS   USE SUGARLESS CANDY INSTEAD (Jolley ranchers or Stover's or Life Savers) or even ice chips will also do - the key is to swallow to prevent all throat clearing. NO OIL BASED VITAMINS - use powdered substitutes.    Please see patient coordinator before you leave today  to schedule sinus CT   Please remember to go to the lab department downstairs in the basement  for your tests - we will call you with the results when they are available.      Please schedule a follow up office visit in 4 weeks, sooner if needed

## 2017-04-27 ENCOUNTER — Telehealth: Payer: Self-pay | Admitting: Internal Medicine

## 2017-04-27 ENCOUNTER — Encounter: Payer: Self-pay | Admitting: Internal Medicine

## 2017-04-27 LAB — RESPIRATORY ALLERGY PROFILE REGION II ~~LOC~~

## 2017-04-27 LAB — INTERPRETATION:

## 2017-04-27 NOTE — Telephone Encounter (Signed)
Notes recorded by Tanda Rockers, MD on 04/27/2017 at 4:55 AM EST Call patient : Study is unremarkable, no change in recs  Pt is aware of results and voiced her understanding.  Nothing further is needed.

## 2017-04-27 NOTE — Progress Notes (Signed)
LMTCB

## 2017-04-27 NOTE — Assessment & Plan Note (Signed)
FENO 04/26/2017  =   8 Spirometry 04/26/2017  FEV1 1.92 (77%)  Ratio 78 with abn f/v effort dep portion   - Sinus CT 04/26/17 Normally aerated paranasal sinuses. Patent sinus drainage pathways - Allergy profile 04/26/2017 >  Eos 0.4 /  IgE pending   The most common causes of chronic cough in immunocompetent adults include the following: upper airway cough syndrome (UACS), previously referred to as postnasal drip syndrome (PNDS), which is caused by variety of rhinosinus conditions; (2) asthma; (3) GERD; (4) chronic bronchitis from cigarette smoking or other inhaled environmental irritants; (5) nonasthmatic eosinophilic bronchitis; and (6) bronchiectasis.   These conditions, singly or in combination, have accounted for up to 94% of the causes of chronic cough in prospective studies.   Other conditions have constituted no >6% of the causes in prospective studies These have included bronchogenic carcinoma, chronic interstitial pneumonia, sarcoidosis, left ventricular failure, ACEI-induced cough, and aspiration from a condition associated with pharyngeal dysfunction.    Chronic cough is often simultaneously caused by more than one condition. A single cause has been found from 38 to 82% of the time, multiple causes from 18 to 62%. Multiply caused cough has been the result of three diseases up to 42% of the time.       Most likely this is not asthma (based on very low FENO) or even lung related based on initial eval but rather a form of Upper airway cough syndrome (previously labeled PNDS),  is so named because it's frequently impossible to sort out how much is  CR/sinusitis with freq throat clearing (which can be related to primary GERD)   vs  causing  secondary (" extra esophageal")  GERD from wide swings in gastric pressure that occur with throat clearing, often  promoting self use of mint and menthol lozenges that reduce the lower esophageal sphincter tone and exacerbate the problem further in a cyclical  fashion.   These are the same pts (now being labeled as having "irritable larynx syndrome" by some cough centers) who not infrequently have a history of having failed to tolerate ace inhibitors,  dry powder inhalers or biphosphonates or report having atypical/extraesophageal reflux symptoms that don't respond to standard doses of PPI  and are easily confused as having aecopd or asthma flares by even experienced allergists/ pulmonologists (myself included).   Of the three most common causes of  Sub-acute or recurrent or chronic cough, only one (GERD)  can actually contribute to/ trigger  the other two (asthma and post nasal drip syndrome)  and perpetuate the cylce of cough.  While not intuitively obvious, many patients with chronic low grade reflux do not cough until there is a primary insult that disturbs the protective epithelial barrier and exposes sensitive nerve endings.   This is typically viral but can be direct irritation from pnds or  physical injury such as with an endotracheal tube(which may have been on of the issues complicating her multiple neck /back surgeries).   The point is that once this occurs, it is difficult to eliminate the cycle  using anything but a maximally effective acid suppression regimen at least in the short run, accompanied by an appropriate diet to address non acid GERD.   Will try max rx for gerd/ send allergy profile and regroup in 4 weeks to consider methacholine challenge while on max gerd rx to sort out whether also has cough variant asthma component.   Reviewed with pt: The standardized cough guidelines published in Chest by Delfino Lovett  Talbert Nan in 2006 are still the best available and consist of a multiple step process (up to 12!) , not a single office visit,  and are intended  to address this problem logically,  with an alogrithm dependent on response to empiric treatment at  each progressive step  to determine a specific diagnosis with  minimal addtional testing  needed. Therefore if adherence is an issue or can't be accurately verified,  it's very unlikely the standard evaluation and treatment will be successful here.    Furthermore, response to therapy (other than acute cough suppression, which should only be used short term with avoidance of narcotic containing cough syrups if possible), can be a gradual process for which the patient is not likely to  perceive immediate benefit.  Unlike going to an eye doctor where the best perscription is almost always the first one and is immediately effective, this is almost never the case in the management of chronic cough syndromes. Therefore the patient needs to commit up front to consistently adhere to recommendations  for up to 6 weeks of therapy directed at the likely underlying problem(s) before the response can be reasonably evaluated.    Total time devoted to counseling  > 50 % of initial 60 min office visit:  review case with pt/ discussion of options/alternatives/ personally creating written customized instructions  in presence of pt  then going over those specific  Instructions directly with the pt including how to use all of the meds but in particular covering each new medication in detail and the difference between the maintenance= "automatic" meds and the prns using an action plan format for the latter (If this problem/symptom => do that organization reading Left to right).  Please see AVS from this visit for a full list of these instructions which I personally wrote for this pt and  are unique to this visit.

## 2017-04-30 ENCOUNTER — Telehealth: Payer: Self-pay | Admitting: Internal Medicine

## 2017-04-30 NOTE — Telephone Encounter (Signed)
Notes recorded by Tanda Rockers, MD on 04/27/2017 at 2:26 PM EST Call patient : Studies are unremarkable, no change in recs ----------- Spoke with pt, aware of results/recs.  Nothing further needed.

## 2017-05-02 ENCOUNTER — Other Ambulatory Visit: Payer: Self-pay | Admitting: Physician Assistant

## 2017-05-02 ENCOUNTER — Institutional Professional Consult (permissible substitution): Payer: Medicare Other | Admitting: Internal Medicine

## 2017-05-02 DIAGNOSIS — K219 Gastro-esophageal reflux disease without esophagitis: Secondary | ICD-10-CM | POA: Diagnosis not present

## 2017-05-02 DIAGNOSIS — R1314 Dysphagia, pharyngoesophageal phase: Secondary | ICD-10-CM | POA: Diagnosis not present

## 2017-05-02 DIAGNOSIS — R05 Cough: Secondary | ICD-10-CM | POA: Diagnosis not present

## 2017-05-02 DIAGNOSIS — R490 Dysphonia: Secondary | ICD-10-CM | POA: Diagnosis not present

## 2017-05-03 ENCOUNTER — Other Ambulatory Visit: Payer: Self-pay | Admitting: Orthopedic Surgery

## 2017-05-03 ENCOUNTER — Ambulatory Visit
Admission: RE | Admit: 2017-05-03 | Discharge: 2017-05-03 | Disposition: A | Discharge status: Home / Self Care | Payer: Medicare Other | Source: Ambulatory Visit | Attending: Physician Assistant | Admitting: Physician Assistant

## 2017-05-03 DIAGNOSIS — K219 Gastro-esophageal reflux disease without esophagitis: Secondary | ICD-10-CM

## 2017-05-03 DIAGNOSIS — R1310 Dysphagia, unspecified: Secondary | ICD-10-CM | POA: Diagnosis not present

## 2017-05-03 DIAGNOSIS — M25551 Pain in right hip: Secondary | ICD-10-CM | POA: Diagnosis not present

## 2017-05-03 DIAGNOSIS — R1314 Dysphagia, pharyngoesophageal phase: Secondary | ICD-10-CM

## 2017-05-07 DIAGNOSIS — J040 Acute laryngitis: Secondary | ICD-10-CM | POA: Diagnosis not present

## 2017-05-07 DIAGNOSIS — R933 Abnormal findings on diagnostic imaging of other parts of digestive tract: Secondary | ICD-10-CM | POA: Diagnosis not present

## 2017-05-07 DIAGNOSIS — K219 Gastro-esophageal reflux disease without esophagitis: Secondary | ICD-10-CM | POA: Diagnosis not present

## 2017-05-07 DIAGNOSIS — R1314 Dysphagia, pharyngoesophageal phase: Secondary | ICD-10-CM | POA: Diagnosis not present

## 2017-05-10 DIAGNOSIS — R040 Epistaxis: Secondary | ICD-10-CM | POA: Diagnosis not present

## 2017-05-10 DIAGNOSIS — K219 Gastro-esophageal reflux disease without esophagitis: Secondary | ICD-10-CM | POA: Insufficient documentation

## 2017-05-10 DIAGNOSIS — K130 Diseases of lips: Secondary | ICD-10-CM | POA: Diagnosis not present

## 2017-05-10 DIAGNOSIS — J341 Cyst and mucocele of nose and nasal sinus: Secondary | ICD-10-CM | POA: Insufficient documentation

## 2017-05-10 DIAGNOSIS — R490 Dysphonia: Secondary | ICD-10-CM | POA: Insufficient documentation

## 2017-05-15 ENCOUNTER — Ambulatory Visit
Admission: RE | Admit: 2017-05-15 | Discharge: 2017-05-15 | Disposition: A | Discharge status: Home / Self Care | Payer: Medicare Other | Source: Ambulatory Visit | Attending: Orthopedic Surgery | Admitting: Orthopedic Surgery

## 2017-05-15 DIAGNOSIS — M25551 Pain in right hip: Secondary | ICD-10-CM

## 2017-05-17 DIAGNOSIS — M25551 Pain in right hip: Secondary | ICD-10-CM | POA: Diagnosis not present

## 2017-05-31 DIAGNOSIS — M7061 Trochanteric bursitis, right hip: Secondary | ICD-10-CM | POA: Diagnosis not present

## 2017-05-31 DIAGNOSIS — M542 Cervicalgia: Secondary | ICD-10-CM | POA: Diagnosis not present

## 2017-05-31 DIAGNOSIS — M545 Low back pain: Secondary | ICD-10-CM | POA: Diagnosis not present

## 2017-05-31 DIAGNOSIS — M5416 Radiculopathy, lumbar region: Secondary | ICD-10-CM | POA: Diagnosis not present

## 2017-06-01 ENCOUNTER — Other Ambulatory Visit: Payer: Self-pay | Admitting: Orthopaedic Surgery

## 2017-06-01 DIAGNOSIS — M5416 Radiculopathy, lumbar region: Secondary | ICD-10-CM

## 2017-06-09 ENCOUNTER — Ambulatory Visit
Admission: RE | Admit: 2017-06-09 | Discharge: 2017-06-09 | Disposition: A | Discharge status: Home / Self Care | Payer: Medicare Other | Source: Ambulatory Visit | Attending: Orthopaedic Surgery | Admitting: Orthopaedic Surgery

## 2017-06-09 DIAGNOSIS — M5416 Radiculopathy, lumbar region: Secondary | ICD-10-CM

## 2017-06-09 DIAGNOSIS — M48061 Spinal stenosis, lumbar region without neurogenic claudication: Secondary | ICD-10-CM | POA: Diagnosis not present

## 2017-06-11 DIAGNOSIS — H5201 Hypermetropia, right eye: Secondary | ICD-10-CM | POA: Diagnosis not present

## 2017-06-11 DIAGNOSIS — H5212 Myopia, left eye: Secondary | ICD-10-CM | POA: Diagnosis not present

## 2017-06-11 DIAGNOSIS — H25011 Cortical age-related cataract, right eye: Secondary | ICD-10-CM | POA: Diagnosis not present

## 2017-06-11 DIAGNOSIS — H2513 Age-related nuclear cataract, bilateral: Secondary | ICD-10-CM | POA: Diagnosis not present

## 2017-06-19 DIAGNOSIS — Z85820 Personal history of malignant melanoma of skin: Secondary | ICD-10-CM | POA: Diagnosis not present

## 2017-06-19 DIAGNOSIS — L821 Other seborrheic keratosis: Secondary | ICD-10-CM | POA: Diagnosis not present

## 2017-06-19 DIAGNOSIS — D1801 Hemangioma of skin and subcutaneous tissue: Secondary | ICD-10-CM | POA: Diagnosis not present

## 2017-06-19 DIAGNOSIS — D225 Melanocytic nevi of trunk: Secondary | ICD-10-CM | POA: Diagnosis not present

## 2017-06-20 ENCOUNTER — Encounter: Payer: Self-pay | Admitting: Physician Assistant

## 2017-06-20 DIAGNOSIS — R609 Edema, unspecified: Secondary | ICD-10-CM | POA: Diagnosis not present

## 2017-06-20 DIAGNOSIS — R1030 Lower abdominal pain, unspecified: Secondary | ICD-10-CM | POA: Diagnosis not present

## 2017-06-21 ENCOUNTER — Other Ambulatory Visit: Payer: Self-pay | Admitting: Orthopaedic Surgery

## 2017-06-21 DIAGNOSIS — M7989 Other specified soft tissue disorders: Secondary | ICD-10-CM | POA: Diagnosis not present

## 2017-06-21 DIAGNOSIS — R609 Edema, unspecified: Secondary | ICD-10-CM

## 2017-06-21 DIAGNOSIS — M545 Low back pain: Secondary | ICD-10-CM | POA: Diagnosis not present

## 2017-06-25 ENCOUNTER — Telehealth: Payer: Self-pay | Admitting: *Deleted

## 2017-06-25 NOTE — Telephone Encounter (Signed)
Returning earlier telephone voice message from [name] regarding left leg pain and swelling.  [name] is s/p endovenous laser ablation L GSV by Curt Jews MD in 2011.  [name] was last seen by Dr. Donnetta Hutching January 2018 (right leg evaluated) and October 2018 (left leg evaluated.) and venous duplexes were done on both visits which were negative for DVT and conservative treatment of elevation and compression was advised.  [name] states she has had left leg pain and swelling for 3-4 weeks and was seen by her orthopedic doctor last week who sent her for a venous duplex that same day ([facility] per patient).[name] stated the venous duplex was negative for DVT.  I recommended that she wear compression hose (she states she is unable due to back and knee issues/past surgeries) and elevation of legs.  I offered appointment with Dr. Donnetta Hutching.  [name] is seeing her PCP and orthopedic doctor in the near future and will call [facility] if needed.

## 2017-06-26 DIAGNOSIS — R1314 Dysphagia, pharyngoesophageal phase: Secondary | ICD-10-CM | POA: Diagnosis not present

## 2017-06-26 DIAGNOSIS — R490 Dysphonia: Secondary | ICD-10-CM | POA: Diagnosis not present

## 2017-06-26 DIAGNOSIS — K219 Gastro-esophageal reflux disease without esophagitis: Secondary | ICD-10-CM | POA: Diagnosis not present

## 2017-06-26 DIAGNOSIS — R110 Nausea: Secondary | ICD-10-CM | POA: Diagnosis not present

## 2017-07-02 DIAGNOSIS — R635 Abnormal weight gain: Secondary | ICD-10-CM | POA: Diagnosis not present

## 2017-07-02 DIAGNOSIS — Z6836 Body mass index (BMI) 36.0-36.9, adult: Secondary | ICD-10-CM | POA: Diagnosis not present

## 2017-07-02 DIAGNOSIS — M545 Low back pain: Secondary | ICD-10-CM | POA: Diagnosis not present

## 2017-07-02 DIAGNOSIS — I129 Hypertensive chronic kidney disease with stage 1 through stage 4 chronic kidney disease, or unspecified chronic kidney disease: Secondary | ICD-10-CM | POA: Diagnosis not present

## 2017-07-02 DIAGNOSIS — N182 Chronic kidney disease, stage 2 (mild): Secondary | ICD-10-CM | POA: Diagnosis not present

## 2017-07-02 DIAGNOSIS — N183 Chronic kidney disease, stage 3 (moderate): Secondary | ICD-10-CM | POA: Diagnosis not present

## 2017-07-02 DIAGNOSIS — R609 Edema, unspecified: Secondary | ICD-10-CM | POA: Diagnosis not present

## 2017-07-05 DIAGNOSIS — M545 Low back pain: Secondary | ICD-10-CM | POA: Diagnosis not present

## 2017-07-09 DIAGNOSIS — J069 Acute upper respiratory infection, unspecified: Secondary | ICD-10-CM | POA: Diagnosis not present

## 2017-07-09 DIAGNOSIS — I868 Varicose veins of other specified sites: Secondary | ICD-10-CM | POA: Diagnosis not present

## 2017-07-09 DIAGNOSIS — S40861A Insect bite (nonvenomous) of right upper arm, initial encounter: Secondary | ICD-10-CM | POA: Diagnosis not present

## 2017-07-25 DIAGNOSIS — M5416 Radiculopathy, lumbar region: Secondary | ICD-10-CM | POA: Diagnosis not present

## 2017-07-25 DIAGNOSIS — M545 Low back pain: Secondary | ICD-10-CM | POA: Diagnosis not present

## 2017-08-02 DIAGNOSIS — M79605 Pain in left leg: Secondary | ICD-10-CM | POA: Diagnosis not present

## 2017-08-02 DIAGNOSIS — R600 Localized edema: Secondary | ICD-10-CM | POA: Diagnosis not present

## 2017-08-02 DIAGNOSIS — M79604 Pain in right leg: Secondary | ICD-10-CM | POA: Diagnosis not present

## 2017-08-07 ENCOUNTER — Encounter: Payer: Medicare Other | Attending: Family Medicine | Admitting: Dietician

## 2017-08-07 ENCOUNTER — Encounter: Payer: Self-pay | Admitting: Dietician

## 2017-08-07 DIAGNOSIS — Z713 Dietary counseling and surveillance: Secondary | ICD-10-CM | POA: Insufficient documentation

## 2017-08-07 DIAGNOSIS — Z6836 Body mass index (BMI) 36.0-36.9, adult: Secondary | ICD-10-CM | POA: Insufficient documentation

## 2017-08-07 DIAGNOSIS — E669 Obesity, unspecified: Secondary | ICD-10-CM

## 2017-08-07 DIAGNOSIS — Z6835 Body mass index (BMI) 35.0-35.9, adult: Secondary | ICD-10-CM

## 2017-08-07 NOTE — Progress Notes (Signed)
  Medical Nutrition Therapy:  Appt start time: 1400 end time:  1500.   Assessment:  Primary concerns today: Patient is here today alone.  She would like to lose weight and reports a history of renal insufficiency. Other history includes GERD, HTN, hypothyroidism, edema, neuropathy, fibromyalgia, IBS, and osteoporosis.  She was seen 2 years ago at our office.  Her weight at that time was 209 lbs and was 223 lbs today.  She skips lunch often.  Gets 6-7 hours of sleep per night. GFR was >60 in January.  She has had back and knee surgeries.    Patient lives with her husband.  She does the cooking and shopping.  She tries to cook without salt.  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  Preferred Learning Style:   No preference indicated   Learning Readiness:   Ready   MEDICATIONS: see list to include vitamin D   DIETARY INTAKE:  24-hr recall:  B (7 AM): pastry or cinnamon coffee cake or banana nut bread and coffee with cream   Snk ( AM): none  L ( PM): skips often OR banana sandwich or tomato sandwich or yogurt with fruit and granola Snk ( PM): unsalted nuts D ( PM): rotisserie chicken without skin or meatloaf or sloppy joe AND salad or cooked vegetables OR cereal and milk Snk ( PM): ice cream bar or Smart Carb bar Beverages: coffee with french vanilla cream, water, crystal lite, ICE beverage, occasional sweet tea or unsweetened tea  Usual physical activity: exercises using an exercise ball, yard work including mowing on a rider (2 1/2 acre yard)  Estimated energy needs: 1600 calories 180 g carbohydrates 120 g protein 44 g fat  Progress Towards Goal(s):  In progress.   Nutritional Diagnosis:  NB-1.1 Food and nutrition-related knowledge deficit As related to balance of meals .  As evidenced by diet hx.    Intervention:  Nutrition education/counseling for obesity, and GERD.  Discussed guidelines to decrease  GERD symptoms.  Discussed balance of diet, continued low sodium choices, protein and CKD and importance of increased plant foods.  Consider reading the label on your creamer. Consider walking daily. Avoid eating for 2-3 hours before bed. Continue the low sodium diet.   Avoid skipping meals. Eat more plants (non-starchy vegetables).  Teaching Method Utilized:  Auditory  Handouts given during visit include:  GERD Nutrition Therapy from AND  Meal plan card  Snack list  Barriers to learning/adherence to lifestyle change: pain  Demonstrated degree of understanding via:  Teach Back   Monitoring/Evaluation:  Dietary intake, exercise, and body weight prn.

## 2017-08-07 NOTE — Patient Instructions (Signed)
Consider reading the label on your creamer. Consider walking daily. Avoid eating for 2-3 hours before bed. Continue the low sodium diet.   Avoid skipping meals. Eat more plants (non-starchy vegetables).  Eat more Non-Starchy Vegetables . These include greens, broccoli, cauliflower, cabbage, carrots, beets, eggplant, peppers, squash and others. Minimize added sugars and refined grains . Rethink what you drink.  Choose beverages without added sugar.  Look for 0 carbs on the label. . See the list of whole grains below.  Find alternatives to usual sweet treats. Choose whole foods over processed. Make simple meals at home more often than eating out.  Tips to increase fiber in your diet: (All plants have fiber.  Eat a variety. There are more than are on this list.) Slowly increase the amount of fiber you eat to 25-35 grams per day.  (More is fine if you tolerate it.) . Fiber from whole grains, nuts and seeds o Quinoa, 1/2 cup = 5 grams o Bulgur, 1/2 cup = 4.1 grams o Popcorn, 3 cups = 3.6 grams o Whole Wheat Spaghetti, 1/2 cup = 3.2 grams o Barley, 1/2 cup = 3 grams o Oatmeal, 1/2 cup = 2 grams o Whole Wheat English Muffin = 3 grams o Corn, 1/2 cup = 2.1 grams o Brown Rice, 1/2 cup = 1.8 grams o Flax seeds, 1 Tablespoon = 2.8 grams o Chia seeds, 1 Tablespoon = 11 grams o Almonds, 1 ounce = 3.5 grams fiber . Fiber from legumes o Kidney beans, 1/2 cup 7.9 grams o Lentils, 1/2 cup = 7.8 grams o Pinto beans, 1/2 cup = 7.7 grams o Black beans, 1/2 cup = 7.6 grams o Lima beans, 1/2 cup 6.4 grams o Chick peas, 1/2 cup = 5.3 grams o Black eyed peas, 1/2 cup = 4 grams . Fiber from fruits and vegetables o Pear, 6 grams o Apple. 3.3 grams o Raspberries or Blackberries, 3/4 cup = 6 grams o Strawberries or Blueberries, 1 cup = 3.4 grams o Baked sweet potato 3.8 grams fiber o Baked potato with skin 4.4 grams  o Peas, 1/2 cup = 4.4 grams  o Spinach, 1/2 cup cooked = 3.5 grams  o Avocado,  1/2 = 5 grams

## 2017-08-15 DIAGNOSIS — M5416 Radiculopathy, lumbar region: Secondary | ICD-10-CM | POA: Diagnosis not present

## 2017-08-15 DIAGNOSIS — Z79891 Long term (current) use of opiate analgesic: Secondary | ICD-10-CM | POA: Diagnosis not present

## 2017-08-15 DIAGNOSIS — G894 Chronic pain syndrome: Secondary | ICD-10-CM | POA: Diagnosis not present

## 2017-08-15 DIAGNOSIS — Z79899 Other long term (current) drug therapy: Secondary | ICD-10-CM | POA: Diagnosis not present

## 2017-08-15 DIAGNOSIS — M545 Low back pain: Secondary | ICD-10-CM | POA: Diagnosis not present

## 2017-08-15 DIAGNOSIS — M47896 Other spondylosis, lumbar region: Secondary | ICD-10-CM | POA: Diagnosis not present

## 2017-08-21 DIAGNOSIS — H25011 Cortical age-related cataract, right eye: Secondary | ICD-10-CM | POA: Diagnosis not present

## 2017-08-21 DIAGNOSIS — H25811 Combined forms of age-related cataract, right eye: Secondary | ICD-10-CM | POA: Diagnosis not present

## 2017-08-21 DIAGNOSIS — H2511 Age-related nuclear cataract, right eye: Secondary | ICD-10-CM | POA: Diagnosis not present

## 2017-08-27 DIAGNOSIS — M79604 Pain in right leg: Secondary | ICD-10-CM | POA: Diagnosis not present

## 2017-08-27 DIAGNOSIS — M79605 Pain in left leg: Secondary | ICD-10-CM | POA: Diagnosis not present

## 2017-08-27 DIAGNOSIS — R600 Localized edema: Secondary | ICD-10-CM | POA: Diagnosis not present

## 2017-09-04 DIAGNOSIS — M47816 Spondylosis without myelopathy or radiculopathy, lumbar region: Secondary | ICD-10-CM | POA: Diagnosis not present

## 2017-09-06 DIAGNOSIS — M79604 Pain in right leg: Secondary | ICD-10-CM | POA: Diagnosis not present

## 2017-09-06 DIAGNOSIS — R600 Localized edema: Secondary | ICD-10-CM | POA: Diagnosis not present

## 2017-09-06 DIAGNOSIS — M79605 Pain in left leg: Secondary | ICD-10-CM | POA: Diagnosis not present

## 2017-09-17 DIAGNOSIS — M47816 Spondylosis without myelopathy or radiculopathy, lumbar region: Secondary | ICD-10-CM | POA: Diagnosis not present

## 2017-09-18 DIAGNOSIS — Z1231 Encounter for screening mammogram for malignant neoplasm of breast: Secondary | ICD-10-CM | POA: Diagnosis not present

## 2017-09-20 DIAGNOSIS — M79604 Pain in right leg: Secondary | ICD-10-CM | POA: Diagnosis not present

## 2017-09-20 DIAGNOSIS — R600 Localized edema: Secondary | ICD-10-CM | POA: Diagnosis not present

## 2017-09-20 DIAGNOSIS — M79605 Pain in left leg: Secondary | ICD-10-CM | POA: Diagnosis not present

## 2017-10-04 DIAGNOSIS — E039 Hypothyroidism, unspecified: Secondary | ICD-10-CM | POA: Diagnosis not present

## 2017-10-04 DIAGNOSIS — R600 Localized edema: Secondary | ICD-10-CM | POA: Diagnosis not present

## 2017-10-04 DIAGNOSIS — Z92241 Personal history of systemic steroid therapy: Secondary | ICD-10-CM | POA: Diagnosis not present

## 2017-10-04 DIAGNOSIS — Z8349 Family history of other endocrine, nutritional and metabolic diseases: Secondary | ICD-10-CM | POA: Diagnosis not present

## 2017-10-04 DIAGNOSIS — M79604 Pain in right leg: Secondary | ICD-10-CM | POA: Diagnosis not present

## 2017-10-04 DIAGNOSIS — M79605 Pain in left leg: Secondary | ICD-10-CM | POA: Diagnosis not present

## 2017-10-04 DIAGNOSIS — M8588 Other specified disorders of bone density and structure, other site: Secondary | ICD-10-CM | POA: Diagnosis not present

## 2017-10-09 DIAGNOSIS — H2512 Age-related nuclear cataract, left eye: Secondary | ICD-10-CM | POA: Diagnosis not present

## 2017-10-09 DIAGNOSIS — H25812 Combined forms of age-related cataract, left eye: Secondary | ICD-10-CM | POA: Diagnosis not present

## 2017-10-09 DIAGNOSIS — H25012 Cortical age-related cataract, left eye: Secondary | ICD-10-CM | POA: Diagnosis not present

## 2017-10-15 DIAGNOSIS — M79604 Pain in right leg: Secondary | ICD-10-CM | POA: Diagnosis not present

## 2017-10-15 DIAGNOSIS — R600 Localized edema: Secondary | ICD-10-CM | POA: Diagnosis not present

## 2017-10-31 DIAGNOSIS — M47816 Spondylosis without myelopathy or radiculopathy, lumbar region: Secondary | ICD-10-CM | POA: Diagnosis not present

## 2017-11-13 DIAGNOSIS — I890 Lymphedema, not elsewhere classified: Secondary | ICD-10-CM | POA: Diagnosis not present

## 2017-11-28 DIAGNOSIS — G894 Chronic pain syndrome: Secondary | ICD-10-CM | POA: Diagnosis not present

## 2017-11-28 DIAGNOSIS — M47816 Spondylosis without myelopathy or radiculopathy, lumbar region: Secondary | ICD-10-CM | POA: Diagnosis not present

## 2017-12-04 DIAGNOSIS — I890 Lymphedema, not elsewhere classified: Secondary | ICD-10-CM | POA: Diagnosis not present

## 2017-12-12 NOTE — Progress Notes (Signed)
Cardiology Office Note:    Date:  12/13/2017   ID:  [name], DOB 08-02-1950, MRN [id number]  PCP:  Kelton Pillar, MD  Cardiologist:  Shirlee More, MD    Referring MD: Kelton Pillar, MD    ASSESSMENT:    1. Palpitation   2. Hypertensive kidney disease with stage 1 chronic kidney disease   3. Lymphedema of both lower extremities    PLAN:    In order of problems listed above:  1. Further evaluation of 2-week extended loop recorder will be performed.  At this time we will continue her beta-blocker.  Unfortunately we cannot discontinue her tricyclic antidepressant for chronic pain 2. Stable BP controlled low-dose beta-blocker followed by nephrology CKD is stable 3. Proved with her external compressive device   Next appointment: 2 months   Medication Adjustments/Labs and Tests Ordered: Current medicines are reviewed at length with the patient today.  Concerns regarding medicines are outlined above.  No orders of the defined types were placed in this encounter.  No orders of the defined types were placed in this encounter.   Chief Complaint  Patient presents with  . Palpitations    History of Present Illness:    [name] is a 67 y.o. female with a hx of palpitation,hypertension and CKD, chronic pain, hypothyroidism and abdominal aorta atherosclerosis last seen 00/13/16 She requested to be seen today for palpitation.  She has been evaluated by vascular surgery tells me she was found to have lymphedema and is using compressive device which is really been very effective in managing her lower extremity swelling.  She has a long history of palpitation she is worn monitors in the past she is having episodes in the evening for 15 minutes were heart rate speeds up she feels weak and then she says the pulse gets very slow but the lowest she is documented as 50 bpm.  She also has swelling in her legs shortness of breath and is concerned of congestive heart  failure.  She is not having orthopnea chest pain or syncope.  She does take a tricyclic antidepressant that can be proarrhythmic and takes a long-acting beta-blocker for hypertension which is been very effective in managing her palpitation.  For further evaluation we will repeat echocardiogram looking for findings of heart failure pulmonary hypertension and a 2-week ambulatory event monitor.  I will see back in the office in 2 months.  I particularly do not think she needs an ischemia evaluation Compliance with diet, lifestyle and medications: Yes Past Medical History:  Diagnosis Date  . Allergy   . Anemia, iron deficiency 01/22/2011   May '12 - 13.8 Hgb, Nov 2, '12 Hgb 10.9   . Arthritis   . 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 CAP (community acquired pneumonia) 10/21/2016  . Chest pain in adult 07/08/2010   March 5, '09 negative cardiolite Sept 28, '10 negative nuclear stress (cardiolite) Dec 21, '11  Negative nuclear stress [facility] Fling) June 26, '14 Negative nuclear stress - [place] with Dr. Bettina Gavia per patient report.  Overview:  CAD risk score is low at 3%  . Chondromalacia patellae 12/02/2012  . Chronic back pain   . Chronic kidney disease   . Chronic knee pain   . Chronic pain 09/22/2010   Multiple sources of pain: radicular pain from lumbar disease and post-operative pain; neck pain from cervical disk disease; left knee pain from trauma.  Plan - will wean off prednisone  Will continue percocet at 1  5/325 norco every 6 hours prn           NSAID - trial of meloxicam 6m g once a day           Increase gabapentin slowly to 600mg  tid and reassess           Executed a pain con  . Chronic venous insufficiency 10/10/2013  . Colitis 10/21/2016  . Collagen vascular disease ([place])   . Deformity of joint 04/23/2013  . Depression with anxiety 09/21/2010  . Derangement of knee, left 2009   started with fall at work. Has had repair patella and torn meniscus  . DJD (degenerative joint  disease) of knee 09/02/2012  . Essential hypertension 07/08/2010   Long standing hypertension medically managed. Currently seeing Dr. Daneen Schick and Dr. Marin Roberts ([facility]) for cardiology.  Meds  HCT only  Overview:  Overview:  Long standing hypertension medically managed. Currently seeing Dr. Daneen Schick and Dr. Bettina Gavia ([facility]) for cardiology.  Meds  HCT only  Last Assessment & Plan:  Formatting of this note may be different from the original. BP Readings  . Fever chills 10/21/2016  . Fibromyalgia 02/27/2017  . High cholesterol   . Hx of appendectomy   . Hypertension   . Knee internal derangement 07/08/2010   Left knee: Feb '09 medial and lateral meniscal tears by MRI                  April '09 arthroscopic surgery   . Melanoma ([place]) 11/19/2015  . Migraine headache    hormonal related - less frequent off hormone replacement  . Migraines   . Neuropathy 04/23/2013  . Peripheral cyanosis 09/28/2012   Full evaluation by Dr. Tobie Lords for rheumatology - no evidence of Raynaud's syndrome or disease; no evidence of underlying rheumatologic disease or malignancy. Most probable cause is neurogenic.   . Pseudoarthrosis of lumbar spine 01/02/2011  . Psoriasis 04/23/2013  . Raynaud disease   . Routine health maintenance 08/17/2011   Colonoscopy - no record in EPIC of colonoscopy  Immunizations - Tdap June '13.    . Salmonella enteritis 10/23/2016  . Sepsis ([place]) 10/23/2016  . Spinal stenosis 08/14/2010   history of cervical laminectomy, lumbar diskectomy with fixation devices and redo surgery.   . Status post lumbar spinal fusion 10/02/2011  . SVT (supraventricular tachycardia) ([place])   . Synovial cyst of lumbar facet joint    lower back - s/p surgery   . Tear of lateral meniscus of left knee 09/30/2012    Past Surgical History:  Procedure Laterality Date  . APPENDECTOMY  1971  . CERVICAL DISCECTOMY  1999   diskectomy with fixation:plate and screws  . COLONOSCOPY    . ESOPHAGOGASTRODUODENOSCOPY   12/2014  . KNEE ARTHROSCOPY W/ MENISCAL REPAIR  09   left knee  . L4-5 lumbar fusion  September 02, 2010   Dr. Hal Neer:  minimally invasive transforaminal interbody fusion with spacer  . OVARIAN CYST SURGERY  1972  . PATELLAR REEFING  '09   repair of fractured patella after fall  . SPINE SURGERY    . SYNOVIAL CYST EXCISION  2007   lumbar spine  . VENOUS ABLATION     for pain in the groin - VVTS did procedure. Has some residual discomfort at the  ablation site.   . WISDOM TOOTH EXTRACTION      Current Medications: Current Meds  Medication Sig  . acetaminophen (TYLENOL) 500 MG tablet  Take 1,000 mg by mouth every 6 (six) hours as needed for mild pain, moderate pain or headache.   Marland Kitchen amitriptyline (ELAVIL) 10 MG tablet Take 10 mg by mouth at bedtime.  Marland Kitchen aspirin EC 81 MG tablet Take 81 mg by mouth daily.   . B Complex Vitamins (VITAMIN B COMPLEX PO) Take 1 tablet by mouth daily.   . cholecalciferol (VITAMIN D) 1000 units tablet Take 1,000 Units by mouth daily.  . clonazePAM (KLONOPIN) 1 MG tablet Take 1 tablet (1 mg total) by mouth at bedtime as needed for anxiety.  Marland Kitchen esomeprazole (NEXIUM) 40 MG capsule Take 20 mg by mouth 2 (two) times daily.   . fluticasone (FLONASE) 50 MCG/ACT nasal spray Place into both nostrils daily.  . Guaifenesin (MUCINEX MAXIMUM STRENGTH) 1200 MG TB12 Take 1 tablet (1,200 mg total) by mouth every 12 (twelve) hours as needed.  Marland Kitchen Hyoscyamine Sulfate SL (LEVSIN/SL) 0.125 MG SUBL Place 0.125 mg under the tongue 4 (four) times daily.  Marland Kitchen levothyroxine (SYNTHROID, LEVOTHROID) 75 MCG tablet Take 75 mcg by mouth daily before breakfast.   . Magnesium 300 MG CAPS Take 300 mg by mouth daily.  . metoprolol succinate (TOPROL-XL) 25 MG 24 hr tablet Take 1 tablet (25 mg total) by mouth daily.  . sucralfate (CARAFATE) 1 G tablet Take 1 g by mouth daily.  Marland Kitchen tiZANidine (ZANAFLEX) 2 MG tablet Take 2 mg by mouth 2 (two) times daily as needed. Reported on 04/05/2015  . traMADol (ULTRAM) 50  MG tablet Take 1 tablet (50 mg total) by mouth every 6 (six) hours as needed.  . Turmeric 500 MG CAPS Take 1 capsule by mouth 2 (two) times daily.   Marland Kitchen zinc gluconate 50 MG tablet Take 50 mg by mouth daily.     Allergies:   Epinephrine; Nsaids; Other; Pregabalin; Tolmetin; Prednisone; Tape; Tapentadol; Captopril; Lisinopril; Caine-1 [lidocaine hcl]; Celecoxib; Codeine; Lidocaine hcl; Procaine; Sulfa antibiotics; and Sulfasalazine   Social History   Socioeconomic History  . Marital status: Married    Spouse name: Not on file  . Number of children: Not on file  . Years of education: 68  . Highest education level: Not on file  Occupational History  . 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    Employer: [employer]  Social Needs  . Financial resource strain: Not on file  . Food insecurity:    Worry: Not on file    Inability: Not on file  . Transportation needs:    Medical: Not on file    Non-medical: Not on file  Tobacco Use  . Smoking status: Never Smoker  . Smokeless tobacco: Never Used  Substance and Sexual Activity  . Alcohol use: No  . Drug use: No  . Sexual activity: Yes    Partners: Male    Birth control/protection: None  Lifestyle  . Physical activity:    Days per week: Not on file    Minutes per session: Not on file  . Stress: Not on file  Relationships  . Social connections:    Talks on phone: Not on file    Gets together: Not on file    Attends religious service: Not on file    Active member of club or organization: Not on file    Attends meetings of clubs or organizations: Not on file    Relationship status: Not on file  Other Topics Concern  . 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 Married '71- '10/widowed;  engaged. 1 dtr- '72; 1 son '73; 4 grandchildren, 2 step-grands. 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      Family History: The patient's family  history includes Aortic aneurysm in her mother; COPD in her mother; Cancer in her father; Diabetes in her brother, father, and mother; Heart attack in her father; Heart disease in her father; Hyperlipidemia in her father; Hypertension in her father and mother; Hypothyroidism in her mother. ROS:   Please see the history of present illness.    All other systems reviewed and are negative.  EKGs/Labs/Other Studies Reviewed:    The following studies were reviewed today:  EKG:  EKG ordered today.  The ekg ordered today demonstrates sinus rhythm the EKG is normal including acute including QT interval  Echocardiogram 10/27/2014 showed normal left ventricular size wall thickness function EF 55 to 94% and diastolic function was normal for age.  Recent Labs: Her TSH in December 2018 was normal and she is followed by endocrinology in [facility] 03/28/2017: ALT 70; BUN 14; Creatinine, Ser 0.85; Potassium 4.5; Sodium 137 04/26/2017: Hemoglobin 14.2; Platelets 309.0  Recent Lipid Panel    Component Value Date/Time   CHOL 189 12/07/2011 1252   TRIG 127.0 12/07/2011 1252   HDL 49.30 12/07/2011 1252   CHOLHDL 4 12/07/2011 1252   VLDL 25.4 12/07/2011 1252   LDLCALC 114 (H) 12/07/2011 1252    Physical Exam:    VS:  BP 106/78 (BP Location: Left Arm, Patient Position: Sitting, Cuff Size: Large)   Pulse 75   Ht 5\' 5"  (1.651 m)   Wt 225 lb 12.8 oz (102.4 kg)   SpO2 95%   BMI 37.58 kg/m     Wt Readings from Last 3 Encounters:  12/13/17 225 lb 12.8 oz (102.4 kg)  08/07/17 223 lb (101.2 kg)  04/26/17 220 lb (99.8 kg)     GEN:  Well nourished, well developed in no acute distress HEENT: Normal NECK: No JVD; No carotid bruits LYMPHATICS: No lymphadenopathy CARDIAC: Irr Irr variable S1  no murmurs, rubs, gallops RESPIRATORY:  Clear to auscultation without rales, wheezing or rhonchi  ABDOMEN: Soft, non-tender, non-distended MUSCULOSKELETAL:  1+ bilateral at the ankle edema; No deformity  SKIN: Warm  and dry NEUROLOGIC:  Alert and oriented x 3 PSYCHIATRIC:  Normal affect    Signed, Shirlee More, MD  12/13/2017 2:35 PM    [facility]

## 2017-12-13 ENCOUNTER — Ambulatory Visit (INDEPENDENT_AMBULATORY_CARE_PROVIDER_SITE_OTHER): Payer: Medicare Other | Admitting: Cardiology

## 2017-12-13 ENCOUNTER — Encounter: Payer: Self-pay | Admitting: Cardiology

## 2017-12-13 VITALS — BP 106/78 | HR 75 | Ht 65.0 in | Wt 225.8 lb

## 2017-12-13 DIAGNOSIS — N181 Chronic kidney disease, stage 1: Secondary | ICD-10-CM | POA: Diagnosis not present

## 2017-12-13 DIAGNOSIS — R002 Palpitations: Secondary | ICD-10-CM | POA: Diagnosis not present

## 2017-12-13 DIAGNOSIS — I129 Hypertensive chronic kidney disease with stage 1 through stage 4 chronic kidney disease, or unspecified chronic kidney disease: Secondary | ICD-10-CM

## 2017-12-13 DIAGNOSIS — I890 Lymphedema, not elsewhere classified: Secondary | ICD-10-CM | POA: Diagnosis not present

## 2017-12-13 NOTE — Patient Instructions (Addendum)
Medication Instructions:  Your physician recommends that you continue on your current medications as directed. Please refer to the Current Medication list given to you today.    Labwork: NONE  Testing/Procedures: Your physician has requested that you have an echocardiogram. Echocardiography is a painless test that uses sound waves to create images of your heart. It provides your doctor with information about the size and shape of your heart and how well your heart's chambers and valves are working. This procedure takes approximately one hour. There are no restrictions for this procedure.  Your physician has recommended that you wear a ZIO Patch.  A ZIO Patch is a medical device that record the heart's electrical activity. Doctors most often use these monitors to diagnose arrhythmias. Arrhythmias are problems with the speed or rhythm of the heartbeat. The monitor is a small, portable device. You can wear one while you do your normal daily activities. This is usually used to diagnose what is causing palpitations/syncope (passing out).    Follow-Up: Your physician wants you to follow-up in: 2 months. You will receive a reminder letter in the mail two months in advance. If you don't receive a letter, please call our office to schedule the follow-up appointment.   Any Other Special Instructions Will Be Listed Below (If Applicable).     If you need a refill on your cardiac medications before your next appointment, please call your pharmacy.     KardiaMobile [email or web address]        FDA-cleared, clinical grade mobile EKG monitor: Jodelle Red is the most clinically-validated mobile EKG used by the world's leading cardiac care medical professionals With Basic service, know instantly if your heart rhythm is normal or if atrial fibrillation is detected, and email the last single EKG recording to yourself or your doctor Premium service, available for purchase  through the K[employer]ia app for $9.99 per month or $99 per year, includes unlimited history and storage of your EKG recordings, a monthly EKG summary report to share with your doctor, along with the ability to track your blood pressure, activity and weight Includes one KardiaMobile phone clip FREE SHIPPING: Standard delivery 1-3 business days. Orders placed by 11:00am PST will ship that afternoon. Otherwise, will ship next business day. All orders ship via [employer] from [place], [place]

## 2017-12-24 ENCOUNTER — Ambulatory Visit: Payer: Medicare Other

## 2017-12-24 ENCOUNTER — Encounter: Payer: Self-pay | Admitting: *Deleted

## 2017-12-24 ENCOUNTER — Ambulatory Visit (HOSPITAL_BASED_OUTPATIENT_CLINIC_OR_DEPARTMENT_OTHER)
Admission: RE | Admit: 2017-12-24 | Discharge: 2017-12-24 | Disposition: A | Discharge status: Home / Self Care | Payer: Medicare Other | Source: Ambulatory Visit | Attending: Cardiology | Admitting: Cardiology

## 2017-12-24 DIAGNOSIS — I890 Lymphedema, not elsewhere classified: Secondary | ICD-10-CM | POA: Diagnosis not present

## 2017-12-24 DIAGNOSIS — E785 Hyperlipidemia, unspecified: Secondary | ICD-10-CM | POA: Diagnosis not present

## 2017-12-24 DIAGNOSIS — I119 Hypertensive heart disease without heart failure: Secondary | ICD-10-CM | POA: Insufficient documentation

## 2017-12-24 DIAGNOSIS — I471 Supraventricular tachycardia: Secondary | ICD-10-CM | POA: Insufficient documentation

## 2017-12-24 DIAGNOSIS — R002 Palpitations: Secondary | ICD-10-CM

## 2017-12-24 NOTE — Progress Notes (Signed)
  Echocardiogram 2D Echocardiogram has been performed.  Deborrah Mabin T Wilma Michaelson 12/24/2017, 3:15 PM

## 2017-12-26 DIAGNOSIS — Z23 Encounter for immunization: Secondary | ICD-10-CM | POA: Diagnosis not present

## 2017-12-27 DIAGNOSIS — I129 Hypertensive chronic kidney disease with stage 1 through stage 4 chronic kidney disease, or unspecified chronic kidney disease: Secondary | ICD-10-CM | POA: Diagnosis not present

## 2017-12-27 DIAGNOSIS — D225 Melanocytic nevi of trunk: Secondary | ICD-10-CM | POA: Diagnosis not present

## 2017-12-27 DIAGNOSIS — D1801 Hemangioma of skin and subcutaneous tissue: Secondary | ICD-10-CM | POA: Diagnosis not present

## 2017-12-27 DIAGNOSIS — E039 Hypothyroidism, unspecified: Secondary | ICD-10-CM | POA: Diagnosis not present

## 2017-12-27 DIAGNOSIS — L821 Other seborrheic keratosis: Secondary | ICD-10-CM | POA: Diagnosis not present

## 2017-12-27 DIAGNOSIS — N182 Chronic kidney disease, stage 2 (mild): Secondary | ICD-10-CM | POA: Diagnosis not present

## 2017-12-27 DIAGNOSIS — I890 Lymphedema, not elsewhere classified: Secondary | ICD-10-CM | POA: Diagnosis not present

## 2017-12-27 DIAGNOSIS — Z85820 Personal history of malignant melanoma of skin: Secondary | ICD-10-CM | POA: Diagnosis not present

## 2018-01-15 DIAGNOSIS — I129 Hypertensive chronic kidney disease with stage 1 through stage 4 chronic kidney disease, or unspecified chronic kidney disease: Secondary | ICD-10-CM | POA: Diagnosis not present

## 2018-01-15 DIAGNOSIS — K219 Gastro-esophageal reflux disease without esophagitis: Secondary | ICD-10-CM | POA: Diagnosis not present

## 2018-01-15 DIAGNOSIS — M810 Age-related osteoporosis without current pathological fracture: Secondary | ICD-10-CM | POA: Diagnosis not present

## 2018-01-15 DIAGNOSIS — I471 Supraventricular tachycardia: Secondary | ICD-10-CM | POA: Diagnosis not present

## 2018-01-15 DIAGNOSIS — K760 Fatty (change of) liver, not elsewhere classified: Secondary | ICD-10-CM | POA: Diagnosis not present

## 2018-01-15 DIAGNOSIS — M797 Fibromyalgia: Secondary | ICD-10-CM | POA: Diagnosis not present

## 2018-01-15 DIAGNOSIS — K589 Irritable bowel syndrome without diarrhea: Secondary | ICD-10-CM | POA: Diagnosis not present

## 2018-01-15 DIAGNOSIS — R002 Palpitations: Secondary | ICD-10-CM | POA: Diagnosis not present

## 2018-01-15 DIAGNOSIS — E039 Hypothyroidism, unspecified: Secondary | ICD-10-CM | POA: Diagnosis not present

## 2018-01-15 DIAGNOSIS — Z0000 Encounter for general adult medical examination without abnormal findings: Secondary | ICD-10-CM | POA: Diagnosis not present

## 2018-01-15 DIAGNOSIS — N183 Chronic kidney disease, stage 3 (moderate): Secondary | ICD-10-CM | POA: Diagnosis not present

## 2018-01-15 DIAGNOSIS — Z6837 Body mass index (BMI) 37.0-37.9, adult: Secondary | ICD-10-CM | POA: Diagnosis not present

## 2018-01-15 DIAGNOSIS — Z23 Encounter for immunization: Secondary | ICD-10-CM | POA: Diagnosis not present

## 2018-01-15 DIAGNOSIS — Z1389 Encounter for screening for other disorder: Secondary | ICD-10-CM | POA: Diagnosis not present

## 2018-01-17 ENCOUNTER — Telehealth: Payer: Self-pay | Admitting: *Deleted

## 2018-01-17 DIAGNOSIS — I129 Hypertensive chronic kidney disease with stage 1 through stage 4 chronic kidney disease, or unspecified chronic kidney disease: Secondary | ICD-10-CM

## 2018-01-17 DIAGNOSIS — N181 Chronic kidney disease, stage 1: Principal | ICD-10-CM

## 2018-01-17 MED ORDER — METOPROLOL SUCCINATE ER 25 MG PO TB24: 25.0000 mg | ORAL_TABLET | Freq: Two times a day (BID) | ORAL | 0 refills | Status: DC

## 2018-01-17 NOTE — Telephone Encounter (Signed)
-----   Message from Richardo Priest, MD sent at 01/17/2018  3:02 PM EST ----- Normal or stable result  Brief runs of extra beats, lets increase Toprol to twice da=ily to control

## 2018-01-17 NOTE — Telephone Encounter (Signed)
Patient informed of 14 day ZIO monitor results and advised to increase metoprolol succinate 25 mg from once to twice daily. New prescription sent to [facility] in [place] as requested. Patient verbalized understanding. No further questions.

## 2018-02-17 DIAGNOSIS — I471 Supraventricular tachycardia: Secondary | ICD-10-CM

## 2018-02-17 DIAGNOSIS — I491 Atrial premature depolarization: Secondary | ICD-10-CM

## 2018-02-17 DIAGNOSIS — I4719 Other supraventricular tachycardia: Secondary | ICD-10-CM

## 2018-02-17 HISTORY — DX: Atrial premature depolarization: I49.1

## 2018-02-17 HISTORY — DX: Other supraventricular tachycardia: I47.19

## 2018-02-17 HISTORY — DX: Supraventricular tachycardia: I47.1

## 2018-02-17 NOTE — Progress Notes (Signed)
Cardiology Office Note:    Date:  02/18/2018   ID:  [name], DOB 11-13-1950, MRN [id number]  PCP:  Kelton Pillar, MD  Cardiologist:  Shirlee More, MD    Referring MD: Kelton Pillar, MD    ASSESSMENT:    1. APC (atrial premature contractions)   2. PAT (paroxysmal atrial tachycardia) (HCC)    PLAN:    In order of problems listed above:  1. She is improved taking long-acting beta-blocker will continue the same avoid over-the-counter proarrhythmic drugs and is at peace that she does not require further EP evaluation and invasive procedures.  Unfortunately she takes a try cyclic antidepressant for chronic pain syndrome and is not an option to withdrawn and may be proarrhythmic.   Next appointment: 6 months   Medication Adjustments/Labs and Tests Ordered: Current medicines are reviewed at length with the patient today.  Concerns regarding medicines are outlined above.  No orders of the defined types were placed in this encounter.  No orders of the defined types were placed in this encounter.   Chief Complaint  Patient presents with  . Follow-up    Zio with occasional APC's and brief PAt  . Palpitations    History of Present Illness:    [name] is a 68 y.o. female with a hx of palpitation, hypertension and CKD, chronic pain, hypothyroidism and abdominal aorta atherosclerosis  last seen 01/09/2018 for palpitation.  Echocardiogram 12/24/2017 was normal.  An extended 14-day ZIO monitor was performed which showed occasional atrial arrhythmia 4.5% frequent APCs and brief runs of atrial premature contractions consistent with atrial tachycardia there and there were 8 symptomatic events of atrial arrhythmia.  Compliance with diet, lifestyle and medications: Yes  Doing better less arrhythmia and less bothersome taking her current beta-blocker extended release metoprolol higher dose.  She is comfortable that she does not have a dangerous heart rhythm  problem and is not restricted in her activities.  No chest pain shortness of breath or syncope. Past Medical History:  Diagnosis Date  . Allergy   . Anemia, iron deficiency 01/22/2011   May '12 - 13.8 Hgb, Nov 2, '12 Hgb 10.9   . Arthritis   . 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 CAP (community acquired pneumonia) 10/21/2016  . Chest pain in adult 07/08/2010   March 5, '09 negative cardiolite Sept 28, '10 negative nuclear stress (cardiolite) Dec 21, '11  Negative nuclear stress [facility] Fling) June 26, '14 Negative nuclear stress - [place] with Dr. Bettina Gavia per patient report.  Overview:  CAD risk score is low at 3%  . Chondromalacia patellae 12/02/2012  . Chronic back pain   . Chronic kidney disease   . Chronic knee pain   . Chronic pain 09/22/2010   Multiple sources of pain: radicular pain from lumbar disease and post-operative pain; neck pain from cervical disk disease; left knee pain from trauma.  Plan - will wean off prednisone           Will continue percocet at 1  5/325 norco every 6 hours prn           NSAID - trial of meloxicam 12m g once a day           Increase gabapentin slowly to 600mg  tid and reassess           Executed a pain con  . Chronic venous insufficiency 10/10/2013  . Colitis 10/21/2016  . Collagen vascular disease ([place])   . Deformity of joint 04/23/2013  . Depression  with anxiety 09/21/2010  . Derangement of knee, left 2009   started with fall at work. Has had repair patella and torn meniscus  . DJD (degenerative joint disease) of knee 09/02/2012  . Essential hypertension 07/08/2010   Long standing hypertension medically managed. Currently seeing Dr. Daneen Schick and Dr. Marin Roberts ([facility]) for cardiology.  Meds  HCT only  Overview:  Overview:  Long standing hypertension medically managed. Currently seeing Dr. Daneen Schick and Dr. Bettina Gavia ([facility]) for cardiology.  Meds  HCT only  Last Assessment & Plan:  Formatting of this note may be different from the original. BP  Readings  . Fever chills 10/21/2016  . Fibromyalgia 02/27/2017  . High cholesterol   . Hx of appendectomy   . Hypertension   . Knee internal derangement 07/08/2010   Left knee: Feb '09 medial and lateral meniscal tears by MRI                  April '09 arthroscopic surgery   . Melanoma ([place]) 11/19/2015  . Migraine headache    hormonal related - less frequent off hormone replacement  . Migraines   . Neuropathy 04/23/2013  . Peripheral cyanosis 09/28/2012   Full evaluation by Dr. Tobie Lords for rheumatology - no evidence of Raynaud's syndrome or disease; no evidence of underlying rheumatologic disease or malignancy. Most probable cause is neurogenic.   . Pseudoarthrosis of lumbar spine 01/02/2011  . Psoriasis 04/23/2013  . Raynaud disease   . Routine health maintenance 08/17/2011   Colonoscopy - no record in EPIC of colonoscopy  Immunizations - Tdap June '13.    . Salmonella enteritis 10/23/2016  . Sepsis ([place]) 10/23/2016  . Spinal stenosis 08/14/2010   history of cervical laminectomy, lumbar diskectomy with fixation devices and redo surgery.   . Status post lumbar spinal fusion 10/02/2011  . SVT (supraventricular tachycardia) ([place])   . Synovial cyst of lumbar facet joint    lower back - s/p surgery   . Tear of lateral meniscus of left knee 09/30/2012    Past Surgical History:  Procedure Laterality Date  . APPENDECTOMY  1971  . CERVICAL DISCECTOMY  1999   diskectomy with fixation:plate and screws  . COLONOSCOPY    . ESOPHAGOGASTRODUODENOSCOPY  12/2014  . KNEE ARTHROSCOPY W/ MENISCAL REPAIR  09   left knee  . L4-5 lumbar fusion  September 02, 2010   Dr. Hal Neer:  minimally invasive transforaminal interbody fusion with spacer  . OVARIAN CYST SURGERY  1972  . PATELLAR REEFING  '09   repair of fractured patella after fall  . SPINE SURGERY    . SYNOVIAL CYST EXCISION  2007   lumbar spine  . VENOUS ABLATION     for pain in the groin - [facility]TS did procedure. Has some residual discomfort at the   ablation site.   . WISDOM TOOTH EXTRACTION      Current Medications: Current Meds  Medication Sig  . acetaminophen (TYLENOL) 500 MG tablet Take 1,000 mg by mouth every 6 (six) hours as needed for mild pain, moderate pain or headache.   Marland Kitchen amitriptyline (ELAVIL) 10 MG tablet Take 10 mg by mouth at bedtime.  Marland Kitchen aspirin EC 81 MG tablet Take 81 mg by mouth daily.   . B Complex Vitamins (VITAMIN B COMPLEX PO) Take 1 tablet by mouth daily.   . cholecalciferol (VITAMIN D) 1000 units tablet Take 1,000 Units by mouth daily.  . clonazePAM (KLONOPIN) 1 MG tablet Take 1 tablet (1 mg total)  by mouth at bedtime as needed for anxiety.  Marland Kitchen esomeprazole (NEXIUM) 20 MG capsule Take 40 mg by mouth 2 (two) times daily.   . fluticasone (FLONASE) 50 MCG/ACT nasal spray Place 1 spray into both nostrils daily as needed.   . Guaifenesin (MUCINEX MAXIMUM STRENGTH) 1200 MG TB12 Take 1 tablet (1,200 mg total) by mouth every 12 (twelve) hours as needed.  Marland Kitchen Hyoscyamine Sulfate SL (LEVSIN/SL) 0.125 MG SUBL Place 0.125 mg under the tongue 4 (four) times daily. (Patient taking differently: Place 0.125 mg under the tongue 4 (four) times daily as needed. )  . levothyroxine (SYNTHROID, LEVOTHROID) 75 MCG tablet Take 75 mcg by mouth daily before breakfast.   . Magnesium 300 MG CAPS Take 300 mg by mouth daily.  . meloxicam (MOBIC) 15 MG tablet Take 15 mg by mouth daily as needed for pain.  . metoprolol succinate (TOPROL-XL) 25 MG 24 hr tablet Take 1 tablet (25 mg total) by mouth 2 (two) times daily. (Patient taking differently: Take 25 mg by mouth 2 (two) times daily. Currently taking 1 tablet in the morning and 0.5 tablet in the evening)  . sucralfate (CARAFATE) 1 G tablet Take 1 g by mouth daily.  Marland Kitchen tiZANidine (ZANAFLEX) 2 MG tablet Take 2 mg by mouth 2 (two) times daily as needed. Reported on 04/05/2015  . traMADol (ULTRAM) 50 MG tablet Take 1 tablet (50 mg total) by mouth every 6 (six) hours as needed.  . Turmeric 500 MG CAPS  Take 1 capsule by mouth 2 (two) times daily.   Marland Kitchen zinc gluconate 50 MG tablet Take 50 mg by mouth daily.     Allergies:   Epinephrine; Nsaids; Other; Pregabalin; Tolmetin; Prednisone; Tape; Tapentadol; Captopril; Lisinopril; Caine-1 [lidocaine hcl]; Celecoxib; Codeine; Lidocaine hcl; Procaine; Sulfa antibiotics; and Sulfasalazine   Social History   Socioeconomic History  . Marital status: Married    Spouse name: Not on file  . Number of children: Not on file  . Years of education: 47  . Highest education level: Not on file  Occupational History  . 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    Employer: [employer]  Social Needs  . Financial resource strain: Not on file  . Food insecurity:    Worry: Not on file    Inability: Not on file  . Transportation needs:    Medical: Not on file    Non-medical: Not on file  Tobacco Use  . Smoking status: Never Smoker  . Smokeless tobacco: Never Used  Substance and Sexual Activity  . Alcohol use: No  . Drug use: No  . Sexual activity: Yes    Partners: Male    Birth control/protection: None  Lifestyle  . Physical activity:    Days per week: Not on file    Minutes per session: Not on file  . Stress: Not on file  Relationships  . Social connections:    Talks on phone: Not on file    Gets together: Not on file    Attends religious service: Not on file    Active member of club or organization: Not on file    Attends meetings of clubs or organizations: Not on file    Relationship status: Not on file  Other Topics Concern  . 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 1 dtr- '72; 1 son '73; 4 grandchildren, 2 step-grands. 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      Family History: The patient's family history includes Aortic aneurysm in her mother; COPD in her mother; Cancer in her father; Diabetes in her  brother, father, and mother; Heart attack in her father; Heart disease in her father; Hyperlipidemia in her father; Hypertension in her father and mother; Hypothyroidism in her mother. ROS:   Please see the history of present illness.    All other systems reviewed and are negative.  EKGs/Labs/Other Studies Reviewed:    The following studies were reviewed today  Recent Labs: 03/28/2017: ALT 70; BUN 14; Creatinine, Ser 0.85; Potassium 4.5; Sodium 137 04/26/2017: Hemoglobin 14.2; Platelets 309.0  Recent Lipid Panel    Component Value Date/Time   CHOL 189 12/07/2011 1252   TRIG 127.0 12/07/2011 1252   HDL 49.30 12/07/2011 1252   CHOLHDL 4 12/07/2011 1252   VLDL 25.4 12/07/2011 1252   LDLCALC 114 (H) 12/07/2011 1252    Physical Exam:    VS:  Ht 5\' 5"  (1.651 m)   Wt 225 lb 12.8 oz (102.4 kg)   BMI 37.58 kg/m     Wt Readings from Last 3 Encounters:  02/18/18 225 lb 12.8 oz (102.4 kg)  12/13/17 225 lb 12.8 oz (102.4 kg)  08/07/17 223 lb (101.2 kg)     GEN:  Well nourished, well developed in no acute distress HEENT: Normal NECK: No JVD; No carotid bruits LYMPHATICS: No lymphadenopathy CARDIAC: RRR, no murmurs, rubs, gallops RESPIRATORY:  Clear to auscultation without rales, wheezing or rhonchi  ABDOMEN: Soft, non-tender, non-distended MUSCULOSKELETAL:  No edema; No deformity  SKIN: Warm and dry NEUROLOGIC:  Alert and oriented x 3 PSYCHIATRIC:  Normal affect    Signed, Shirlee More, MD  02/18/2018 11:11 AM    [place]

## 2018-02-18 ENCOUNTER — Ambulatory Visit (INDEPENDENT_AMBULATORY_CARE_PROVIDER_SITE_OTHER): Payer: Medicare Other | Admitting: Cardiology

## 2018-02-18 ENCOUNTER — Encounter: Payer: Self-pay | Admitting: Cardiology

## 2018-02-18 DIAGNOSIS — I471 Supraventricular tachycardia: Secondary | ICD-10-CM | POA: Diagnosis not present

## 2018-02-18 DIAGNOSIS — I491 Atrial premature depolarization: Secondary | ICD-10-CM

## 2018-02-18 NOTE — Patient Instructions (Addendum)
Medication Instructions:  Your physician recommends that you continue on your current medications as directed. Please refer to the Current Medication list given to you today.  If you need a refill on your cardiac medications before your next appointment, please call your pharmacy.   Lab work: None  If you have labs (blood work) drawn today and your tests are completely normal, you will receive your results only by: Marland Kitchen MyChart Message (if you have MyChart) OR . A paper copy in the mail If you have any lab test that is abnormal or we need to change your treatment, we will call you to review the results.  Testing/Procedures: None  Follow-Up: At [facility], you and your health needs are our priority.  As part of our continuing mission to provide you with exceptional heart care, we have created designated Provider Care Teams.  These Care Teams include your primary Cardiologist (physician) and Advanced Practice Providers (APPs -  Physician Assistants and Nurse Practitioners) who all work together to provide you with the care you need, when you need it. You will need a follow up appointment in 6 months.  Please call our office 2 months in advance to schedule this appointment.        1. Avoid all over-the-counter antihistamines except Claritin/Loratadine and Zyrtec/Cetrizine. 2. Avoid all combination including cold sinus allergies flu decongestant and sleep medications 3. You can use Robitussin DM Mucinex and Mucinex DM for cough. 4. can use Tylenol aspirin ibuprofen and naproxen but no combinations such as sleep or sinus.

## 2018-04-12 ENCOUNTER — Telehealth: Payer: Self-pay | Admitting: *Deleted

## 2018-04-12 NOTE — Telephone Encounter (Signed)
Pt received letter from [facility] and was concerned about privacy, not sure it was legitimate. I called [employer] and they needed secondary ins. #. Gave them number and they said they would bill remaining balance to her [employer] supplement. Notified pt.

## 2018-04-23 DIAGNOSIS — N3090 Cystitis, unspecified without hematuria: Secondary | ICD-10-CM | POA: Diagnosis not present

## 2018-04-23 DIAGNOSIS — R309 Painful micturition, unspecified: Secondary | ICD-10-CM | POA: Diagnosis not present

## 2018-04-30 ENCOUNTER — Encounter: Payer: Self-pay | Admitting: Neurology

## 2018-04-30 ENCOUNTER — Telehealth: Payer: Self-pay | Admitting: Neurology

## 2018-04-30 ENCOUNTER — Ambulatory Visit (INDEPENDENT_AMBULATORY_CARE_PROVIDER_SITE_OTHER): Payer: Medicare Other | Admitting: Neurology

## 2018-04-30 DIAGNOSIS — G500 Trigeminal neuralgia: Secondary | ICD-10-CM | POA: Diagnosis not present

## 2018-04-30 MED ORDER — DIAZEPAM 5 MG PO TABS: ORAL_TABLET | ORAL | 0 refills | Status: DC

## 2018-04-30 NOTE — Telephone Encounter (Signed)
Medicae/bcbs supp order sent to GI. No auth they will reach out to the pt to schedule.

## 2018-04-30 NOTE — Progress Notes (Signed)
PATIENT: [name] DOB: 09/11/1950  Chief Complaint  Patient presents with  . New Patient (Initial Visit)    New rm, reffered by Mateo Flow DMD for pain on L side of face   . Primary care physician    Kelton Pillar, MD  . Facial Pain    Pt states this started in Nov-Dec in 2019 she started having pain on the left side of her face.   . Fatigue    Pt states she has also been having weakness in bilateral hands/legs      HISTORICAL  [name] is a 68 year old female, seen in request by her primary care physician Dr. Maurine Cane for evaluation of left facial pain, initial evaluation was on April 30, 2018.  I have reviewed and summarized the referring note from the referring physician.  She had past medical history of hypertension, migraine headache, chronic kidney disease, history of cervical decompression in 1999, lumbar spine synovial cyst decompression and fusion in 2007, also had L4-5 lumbar fusion in 2012, she had a residual right foot drop, numbness since second lumbar decompression surgery in 2012, L3-S1 reversal laminectomy and fusion in 2016  Since November 2019, she had recurrent episode of radiating pain at her left face, initially felt like little finger in the light socket, sharp radiating pain from left jaw to left ear, sensitivity involving left upper and lower teeth, multiple episode during the day, she has some gabapentin, which does help her symptoms, but 300 mg of gabapentin make her dizzy, sleepy,  She was seen by dentist initially, was treated with antibiotic, consider possibility of root canal, but there was no dental pathology found, were symptoms last about 2 weeks, now has much improved, no longer has recurrent radiating pain since January 2020,  I personally reviewed MRI of lumbar in March 2019, L3-4 PLIF, adjacent segment degeneration at L2-3 with retrolisthesis, L2-3 mild spinal and foraminal stenosis, L3 to sacrum solid  arthrodesis,  Laboratory evaluation showed normal CMP, CBC  REVIEW OF SYSTEMS: Full 14 system review of systems performed and notable only for weight gain, fatigue, palpitation, sweating legs, trouble swallowing, itching, easy bruising, feeling hot, cold, increased thirst, flushing, joint pain, swelling, cramps, aching muscles, skin sensitivity, numbness, weakness, difficulty swallowing, restless leg, anxiety, decreased energy All other review of systems were negative.  ALLERGIES: Allergies  Allergen Reactions  . Epinephrine Palpitations    NASAL SPRAY Cardiac dysrhythmia NASAL SPRAY NASAL SPRAY NASAL SPRAY   . Nsaids Swelling    swelling swelling swelling   . Other Palpitations and Swelling    Glucocorticoids/Steroids Glucocorticoids/Steroids **ANTI INFLAMMATORY DRUGS** Glucocorticoids/Steroids  . Pregabalin Anaphylaxis  . Tolmetin Swelling  . Prednisone Swelling  . Tape Other (See Comments) and Rash    *adhesive* Reaction- blisters  Rips her skin Rips her skin *adhesive* Reaction- blisters  Rips her skin blisters   . Tapentadol Other (See Comments)    *adhesive* Reaction- blisters   . Captopril   . Lisinopril Swelling and Other (See Comments)    Dizziness Dizziness   . Caine-1 [Lidocaine Hcl] Palpitations  . Celecoxib Palpitations    Cardiac dysrhythmia   . Codeine Palpitations    Cardiac dysrhythmia Cardiac dysrhythmia   . Lidocaine Hcl Palpitations  . Procaine Other (See Comments) and Palpitations    dizziness Dizziness dizziness   . Sulfa Antibiotics Palpitations    Cardiac dysrhythmia   . Sulfasalazine Palpitations    HOME MEDICATIONS: Current Outpatient Medications  Medication Sig Dispense Refill  .  acetaminophen (TYLENOL) 500 MG tablet Take 1,000 mg by mouth every 6 (six) hours as needed for mild pain, moderate pain or headache.     Marland Kitchen amitriptyline (ELAVIL) 10 MG tablet Take 10 mg by mouth at bedtime.    Marland Kitchen aspirin EC 81 MG tablet Take  81 mg by mouth daily.     . B Complex Vitamins (VITAMIN B COMPLEX PO) Take 1 tablet by mouth daily.     . cholecalciferol (VITAMIN D) 1000 units tablet Take 1,000 Units by mouth daily.    . clonazePAM (KLONOPIN) 1 MG tablet Take 1 tablet (1 mg total) by mouth at bedtime as needed for anxiety. 30 tablet 0  . esomeprazole (NEXIUM) 20 MG capsule Take 40 mg by mouth 2 (two) times daily.     . fluticasone (FLONASE) 50 MCG/ACT nasal spray Place 1 spray into both nostrils daily as needed.     . gabapentin (NEURONTIN) 100 MG capsule Take 100 mg by mouth as needed.    . Guaifenesin (MUCINEX MAXIMUM STRENGTH) 1200 MG TB12 Take 1 tablet (1,200 mg total) by mouth every 12 (twelve) hours as needed. 14 tablet 1  . Hyoscyamine Sulfate SL (LEVSIN/SL) 0.125 MG SUBL Place 0.125 mg under the tongue 4 (four) times daily. (Patient taking differently: Place 0.125 mg under the tongue 4 (four) times daily as needed. ) 40 each 0  . levothyroxine (SYNTHROID, LEVOTHROID) 75 MCG tablet Take 75 mcg by mouth daily before breakfast.     . Magnesium 300 MG CAPS Take 300 mg by mouth daily.    . meloxicam (MOBIC) 15 MG tablet Take 15 mg by mouth daily as needed for pain.    . metoprolol succinate (TOPROL-XL) 25 MG 24 hr tablet Take 1 tablet (25 mg total) by mouth 2 (two) times daily. (Patient taking differently: Take 25 mg by mouth 2 (two) times daily. Currently taking 1 tablet in the morning and 0.5 tablet in the evening) 180 tablet 0  . Multiple Vitamins-Minerals (MULTIVITAMIN ADULT PO) Take by mouth.    . sucralfate (CARAFATE) 1 G tablet Take 1 g by mouth daily.    Marland Kitchen tiZANidine (ZANAFLEX) 2 MG tablet Take 2 mg by mouth 2 (two) times daily as needed. Reported on 04/05/2015    . traMADol (ULTRAM) 50 MG tablet Take 1 tablet (50 mg total) by mouth every 6 (six) hours as needed. 8 tablet 0  . Turmeric 500 MG CAPS Take 1 capsule by mouth 2 (two) times daily.     Marland Kitchen zinc gluconate 50 MG tablet Take 50 mg by mouth daily.     No current  facility-administered medications for this visit.     PAST MEDICAL HISTORY: Past Medical History:  Diagnosis Date  . Allergy   . Anemia, iron deficiency 01/22/2011   May '12 - 13.8 Hgb, Nov 2, '12 Hgb 10.9   . Arthritis   . 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 CAP (community acquired pneumonia) 10/21/2016  . Chest pain in adult 07/08/2010   March 5, '09 negative cardiolite Sept 28, '10 negative nuclear stress (cardiolite) Dec 21, '11  Negative nuclear stress [facility]) June 26, '14 Negative nuclear stress - [place] with Dr. Bettina Gavia per patient report.  Overview:  CAD risk score is low at 3%  . Chondromalacia patellae 12/02/2012  . Chronic back pain   . Chronic kidney disease   . Chronic knee pain   . Chronic pain 09/22/2010   Multiple sources of pain: radicular pain from lumbar disease and  post-operative pain; neck pain from cervical disk disease; left knee pain from trauma.  Plan - will wean off prednisone           Will continue percocet at 1  5/325 norco every 6 hours prn           NSAID - trial of meloxicam 20m g once a day           Increase gabapentin slowly to 600mg  tid and reassess           Executed a pain con  . Chronic venous insufficiency 10/10/2013  . Colitis 10/21/2016  . Collagen vascular disease ([place])   . Deformity of joint 04/23/2013  . Depression with anxiety 09/21/2010  . Derangement of knee, left 2009   started with fall at work. Has had repair patella and torn meniscus  . DJD (degenerative joint disease) of knee 09/02/2012  . Essential hypertension 07/08/2010   Long standing hypertension medically managed. Currently seeing Dr. Daneen Schick and Dr. Marin Roberts ([facility]) for cardiology.  Meds  HCT only  Overview:  Overview:  Long standing hypertension medically managed. Currently seeing Dr. Daneen Schick and Dr. Bettina Gavia ([facility]) for cardiology.  Meds  HCT only  Last Assessment & Plan:  Formatting of this note may be different from the original. BP Readings  . Fever  chills 10/21/2016  . Fibromyalgia 02/27/2017  . High cholesterol   . Hx of appendectomy   . Hypertension   . Knee internal derangement 07/08/2010   Left knee: Feb '09 medial and lateral meniscal tears by MRI                  April '09 arthroscopic surgery   . Melanoma ([place]) 11/19/2015  . Migraine headache    hormonal related - less frequent off hormone replacement  . Migraines   . Neuropathy 04/23/2013  . Peripheral cyanosis 09/28/2012   Full evaluation by Dr. Tobie Lords for rheumatology - no evidence of Raynaud's syndrome or disease; no evidence of underlying rheumatologic disease or malignancy. Most probable cause is neurogenic.   . Pseudoarthrosis of lumbar spine 01/02/2011  . Psoriasis 04/23/2013  . Raynaud disease   . Routine health maintenance 08/17/2011   Colonoscopy - no record in EPIC of colonoscopy  Immunizations - Tdap June '13.    . Salmonella enteritis 10/23/2016  . Sepsis ([place]) 10/23/2016  . Spinal stenosis 08/14/2010   history of cervical laminectomy, lumbar diskectomy with fixation devices and redo surgery.   . Status post lumbar spinal fusion 10/02/2011  . SVT (supraventricular tachycardia) ([place])   . Synovial cyst of lumbar facet joint    lower back - s/p surgery   . Tear of lateral meniscus of left knee 09/30/2012    PAST SURGICAL HISTORY: Past Surgical History:  Procedure Laterality Date  . APPENDECTOMY  1971  . CERVICAL DISCECTOMY  1999   diskectomy with fixation:plate and screws  . COLONOSCOPY    . ESOPHAGOGASTRODUODENOSCOPY  12/2014  . KNEE ARTHROSCOPY W/ MENISCAL REPAIR  09   left knee  . L4-5 lumbar fusion  September 02, 2010   Dr. Hal Neer:  minimally invasive transforaminal interbody fusion with spacer  . OVARIAN CYST SURGERY  1972  . PATELLAR REEFING  '09   repair of fractured patella after fall  . SPINE SURGERY    . SYNOVIAL CYST EXCISION  2007   lumbar spine  . VENOUS ABLATION     for pain in the groin - VVTS did procedure. Has some  residual discomfort at  the  ablation site.   . WISDOM TOOTH EXTRACTION      FAMILY HISTORY: Family History  Problem Relation Age of Onset  . Diabetes Mother   . COPD Mother   . Aortic aneurysm Mother   . Hypertension Mother   . Hypothyroidism Mother   . Cancer Father        lung  . Hyperlipidemia Father   . Hypertension Father   . Heart disease Father   . Diabetes Father   . Heart attack Father   . Diabetes Brother     SOCIAL HISTORY: Social History   Socioeconomic History  . Marital status: Married    Spouse name: Not on file  . Number of children: Not on file  . Years of education: 52  . Highest education level: Not on file  Occupational History  . 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    Employer: [employer]  Social Needs  . Financial resource strain: Not on file  . Food insecurity:    Worry: Not on file    Inability: Not on file  . Transportation needs:    Medical: Not on file    Non-medical: Not on file  Tobacco Use  . Smoking status: Never Smoker  . Smokeless tobacco: Never Used  Substance and Sexual Activity  . Alcohol use: No  . Drug use: No  . Sexual activity: Yes    Partners: Male    Birth control/protection: None  Lifestyle  . Physical activity:    Days per week: Not on file    Minutes per session: Not on file  . Stress: Not on file  Relationships  . Social connections:    Talks on phone: Not on file    Gets together: Not on file    Attends religious service: Not on file    Active member of club or organization: Not on file    Attends meetings of clubs or organizations: Not on file    Relationship status: Not on file  . Intimate partner violence:    Fear of current or ex partner: Not on file    Emotionally abused: Not on file    Physically abused: Not on file    Forced sexual activity: Not on file  Other Topics Concern  . 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 1 dtr- '72; 1 son '73; 4 grandchildren,  2 step-grands. 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   Right handed   Lives at home with spouse    2-3 cups daily of caffeine      PHYSICAL EXAM   There were no vitals filed for this visit.  Not recorded      There is no height or weight on file to calculate BMI.  PHYSICAL EXAMNIATION:  Gen: NAD, conversant, well nourised, obese, well groomed                     Cardiovascular: Regular rate rhythm, no peripheral edema, warm, nontender. Eyes: Conjunctivae clear without exudates or hemorrhage Neck: Supple, no carotid bruits. Pulmonary: Clear to auscultation bilaterally   NEUROLOGICAL EXAM:  MENTAL STATUS: Speech:    Speech is normal; fluent and spontaneous with normal comprehension.  Cognition:     Orientation to time, place and person     Normal recent and remote memory     Normal Attention  span and concentration     Normal Language, naming, repeating,spontaneous speech     Fund of knowledge   CRANIAL NERVES: CN II: Visual fields are full to confrontation. Fundoscopic exam is normal with sharp discs and no vascular changes. Pupils are round equal and briskly reactive to light. CN III, IV, VI: extraocular movement are normal. No ptosis. CN V: Facial sensation is intact to pinprick in all 3 divisions bilaterally. Corneal responses are intact.  CN VII: Face is symmetric with normal eye closure and smile. CN VIII: Hearing is normal to rubbing fingers CN IX, X: Palate elevates symmetrically. Phonation is normal. CN XI: Head turning and shoulder shrug are intact CN XII: Tongue is midline with normal movements and no atrophy.  MOTOR: She has mild right ankle dorsiflexion weakness,  REFLEXES: Reflexes are 2+ and symmetric at the biceps, triceps, decreased at right knee, knees, absent at bilateral ankles. Plantar responses are flexor.  SENSORY: Mildly decreased light touch, vibratory  sensation right foot  COORDINATION: Rapid alternating movements and fine finger movements are intact. There is no dysmetria on finger-to-nose and heel-knee-shin.    GAIT/STANCE: Need to push up to get up from seated position, wide-based, cautious, dragging right foot some  DIAGNOSTIC DATA (LABS, IMAGING, TESTING) - I reviewed patient records, labs, notes, testing and imaging myself where available.   ASSESSMENT AND PLAN  [name] is a 68 y.o. female   Left trigeminal neuralgia  MRI of the brain without contrast, reported history of kidney disease in the past  Gabapentin 100 to 300 mg as needed    Marcial Pacas, M.D. Ph.D.  [facility] [address] [place], [place] [address] Ph: [phone] Fax: [phone]  CC: Kelton Pillar, MD

## 2018-05-07 ENCOUNTER — Telehealth: Payer: Self-pay | Admitting: Neurology

## 2018-05-07 ENCOUNTER — Other Ambulatory Visit: Payer: Self-pay | Admitting: *Deleted

## 2018-05-07 DIAGNOSIS — G500 Trigeminal neuralgia: Secondary | ICD-10-CM

## 2018-05-07 NOTE — Telephone Encounter (Signed)
Noted, thank you

## 2018-05-07 NOTE — Telephone Encounter (Signed)
[facility] emailed me and informed me that the MRI order needs to be switch to MRI brain w/wo contrast.

## 2018-05-07 NOTE — Telephone Encounter (Signed)
I have spoken with the patient and discussed the options provided by Dr. Krista Blue.  The patient would like to proceed with the MRI brain w/wo.  She is agreeable to come to our office for BUN/Creatinine levels two days prior to her scan.  The MRI orders have been updated.  The lab orders have been placed in Epic and patient is aware of our office hours.   She verbalized understanding of the importance to increase her water intake.

## 2018-05-07 NOTE — Telephone Encounter (Signed)
Would you like to change this order?  ----- Message -----  From: [name]  Sent: 05/06/2018  5:06 PM EST  To: [facility] Clinical Pool  Subject: RE: Non-Urgent Medical Question           Raquel Sarna, please call [name] at [facility]. She says I need to have an MRI with and without contrast. Dr. Krista Blue had told me she wanted to do it without contrast because I have mild CKD (stage 2). If she has to use contrast, Freda Munro says it is very mild for that diagnosis. Just let me know. Thanks.    [name]   Please call patient, her most recent CMP showed normal kidney function, in theory, imaging study looking for etiology for trigeminal neuralgia usually require with contrast to rule out small structural lesion,  If she worried about potential kidney side effect from IV contrast dye, we have the option of  1.  Do MRI without contrast 2.  Increase water intake, repeat creat level before MRI.

## 2018-05-23 ENCOUNTER — Encounter (HOSPITAL_COMMUNITY): Payer: Self-pay | Admitting: Emergency Medicine

## 2018-05-23 ENCOUNTER — Emergency Department (HOSPITAL_COMMUNITY): Payer: Medicare Other

## 2018-05-23 ENCOUNTER — Emergency Department (HOSPITAL_COMMUNITY)
Admission: EM | Admit: 2018-05-23 | Discharge: 2018-05-24 | Disposition: A | Discharge status: Home / Self Care | Payer: Medicare Other | Source: Home / Self Care | Attending: Emergency Medicine | Admitting: Emergency Medicine

## 2018-05-23 ENCOUNTER — Other Ambulatory Visit: Payer: Self-pay

## 2018-05-23 ENCOUNTER — Other Ambulatory Visit (INDEPENDENT_AMBULATORY_CARE_PROVIDER_SITE_OTHER): Payer: Self-pay

## 2018-05-23 ENCOUNTER — Encounter (HOSPITAL_COMMUNITY): Payer: Self-pay | Admitting: *Deleted

## 2018-05-23 ENCOUNTER — Emergency Department (HOSPITAL_COMMUNITY)
Admission: EM | Admit: 2018-05-23 | Discharge: 2018-05-23 | Disposition: A | Discharge status: Home / Self Care | Payer: Medicare Other | Attending: Emergency Medicine | Admitting: Emergency Medicine

## 2018-05-23 DIAGNOSIS — R52 Pain, unspecified: Secondary | ICD-10-CM | POA: Diagnosis not present

## 2018-05-23 DIAGNOSIS — R109 Unspecified abdominal pain: Secondary | ICD-10-CM

## 2018-05-23 DIAGNOSIS — R1110 Vomiting, unspecified: Secondary | ICD-10-CM

## 2018-05-23 DIAGNOSIS — Z79899 Other long term (current) drug therapy: Secondary | ICD-10-CM | POA: Insufficient documentation

## 2018-05-23 DIAGNOSIS — R197 Diarrhea, unspecified: Secondary | ICD-10-CM | POA: Insufficient documentation

## 2018-05-23 DIAGNOSIS — I129 Hypertensive chronic kidney disease with stage 1 through stage 4 chronic kidney disease, or unspecified chronic kidney disease: Secondary | ICD-10-CM | POA: Insufficient documentation

## 2018-05-23 DIAGNOSIS — Z0289 Encounter for other administrative examinations: Secondary | ICD-10-CM

## 2018-05-23 DIAGNOSIS — N189 Chronic kidney disease, unspecified: Secondary | ICD-10-CM | POA: Insufficient documentation

## 2018-05-23 DIAGNOSIS — R82998 Other abnormal findings in urine: Secondary | ICD-10-CM | POA: Insufficient documentation

## 2018-05-23 DIAGNOSIS — Z5321 Procedure and treatment not carried out due to patient leaving prior to being seen by health care provider: Secondary | ICD-10-CM | POA: Insufficient documentation

## 2018-05-23 DIAGNOSIS — M549 Dorsalgia, unspecified: Secondary | ICD-10-CM | POA: Diagnosis not present

## 2018-05-23 DIAGNOSIS — R0902 Hypoxemia: Secondary | ICD-10-CM | POA: Diagnosis not present

## 2018-05-23 DIAGNOSIS — G894 Chronic pain syndrome: Secondary | ICD-10-CM | POA: Diagnosis not present

## 2018-05-23 DIAGNOSIS — R8290 Unspecified abnormal findings in urine: Secondary | ICD-10-CM

## 2018-05-23 DIAGNOSIS — R110 Nausea: Secondary | ICD-10-CM | POA: Diagnosis not present

## 2018-05-23 DIAGNOSIS — M4316 Spondylolisthesis, lumbar region: Secondary | ICD-10-CM | POA: Diagnosis not present

## 2018-05-23 DIAGNOSIS — Z7982 Long term (current) use of aspirin: Secondary | ICD-10-CM | POA: Insufficient documentation

## 2018-05-23 DIAGNOSIS — R1084 Generalized abdominal pain: Secondary | ICD-10-CM | POA: Diagnosis not present

## 2018-05-23 DIAGNOSIS — M47896 Other spondylosis, lumbar region: Secondary | ICD-10-CM | POA: Diagnosis not present

## 2018-05-23 DIAGNOSIS — R42 Dizziness and giddiness: Secondary | ICD-10-CM | POA: Diagnosis not present

## 2018-05-23 DIAGNOSIS — M48062 Spinal stenosis, lumbar region with neurogenic claudication: Secondary | ICD-10-CM | POA: Diagnosis not present

## 2018-05-23 DIAGNOSIS — G500 Trigeminal neuralgia: Secondary | ICD-10-CM | POA: Diagnosis not present

## 2018-05-23 LAB — URINALYSIS, ROUTINE W REFLEX MICROSCOPIC
Bilirubin Urine: NEGATIVE
Glucose, UA: >=500 mg/dL — AB
Ketones, ur: 5 mg/dL — AB
Nitrite: NEGATIVE
Protein, ur: NEGATIVE mg/dL
Specific Gravity, Urine: 1.042 — ABNORMAL HIGH (ref 1.005–1.030)
WBC, UA: >50 WBC/hpf — ABNORMAL HIGH (ref 0–5)
pH: 5 (ref 5.0–8.0)

## 2018-05-23 LAB — CBC
HCT: 49.3 % — ABNORMAL HIGH (ref 36.0–46.0)
HCT: 45.2 % (ref 36.0–46.0)
Hemoglobin: 15.2 g/dL — ABNORMAL HIGH (ref 12.0–15.0)
Hemoglobin: 14.3 g/dL (ref 12.0–15.0)
MCH: 28 pg (ref 26.0–34.0)
MCH: 28.7 pg (ref 26.0–34.0)
MCHC: 30.8 g/dL (ref 30.0–36.0)
MCHC: 31.6 g/dL (ref 30.0–36.0)
MCV: 90.8 fL (ref 80.0–100.0)
MCV: 90.6 fL (ref 80.0–100.0)
Platelets: 241 10*3/uL (ref 150–400)
Platelets: 233 10*3/uL (ref 150–400)
RBC: 5.43 MIL/uL — ABNORMAL HIGH (ref 3.87–5.11)
RBC: 4.99 MIL/uL (ref 3.87–5.11)
RDW: 14.3 % (ref 11.5–15.5)
RDW: 14.4 % (ref 11.5–15.5)
WBC: 10.1 10*3/uL (ref 4.0–10.5)
WBC: 8.8 10*3/uL (ref 4.0–10.5)
nRBC: 0 % (ref 0.0–0.2)
nRBC: 0 % (ref 0.0–0.2)

## 2018-05-23 LAB — COMPREHENSIVE METABOLIC PANEL
ALT: 43 U/L (ref 0–44)
AST: 45 U/L — ABNORMAL HIGH (ref 15–41)
Albumin: 4 g/dL (ref 3.5–5.0)
Alkaline Phosphatase: 98 U/L (ref 38–126)
Anion gap: 10 (ref 5–15)
BUN: 15 mg/dL (ref 8–23)
CO2: 20 mmol/L — ABNORMAL LOW (ref 22–32)
Calcium: 8.7 mg/dL — ABNORMAL LOW (ref 8.9–10.3)
Chloride: 108 mmol/L (ref 98–111)
Creatinine, Ser: 1.01 mg/dL — ABNORMAL HIGH (ref 0.44–1.00)
GFR calc Af Amer: >60 mL/min (ref >60)
GFR calc non Af Amer: 58 mL/min — ABNORMAL LOW (ref >60)
Glucose, Bld: 111 mg/dL — ABNORMAL HIGH (ref 70–99)
Potassium: 4.2 mmol/L (ref 3.5–5.1)
Sodium: 138 mmol/L (ref 135–145)
Total Bilirubin: 0.7 mg/dL (ref 0.3–1.2)
Total Protein: 7.3 g/dL (ref 6.5–8.1)

## 2018-05-23 LAB — LIPASE, BLOOD: Lipase: 24 U/L (ref 11–51)

## 2018-05-23 MED ORDER — SODIUM CHLORIDE 0.9 % IV SOLN
1.0000 g | Freq: Once | INTRAVENOUS | Status: AC
  Administered 2018-05-23: 1 g via INTRAVENOUS
  Filled 2018-05-23: qty 10

## 2018-05-23 MED ORDER — ONDANSETRON 4 MG PO TBDP
4.0000 mg | ORAL_TABLET | Freq: Once | ORAL | Status: AC | PRN
  Administered 2018-05-23: 4 mg via ORAL
  Filled 2018-05-23: qty 1

## 2018-05-23 MED ORDER — IOHEXOL 300 MG/ML  SOLN
30.0000 mL | Freq: Once | INTRAMUSCULAR | Status: AC | PRN
  Administered 2018-05-23: 30 mL via ORAL

## 2018-05-23 MED ORDER — DICYCLOMINE HCL 10 MG PO CAPS
10.0000 mg | ORAL_CAPSULE | Freq: Once | ORAL | Status: AC
  Administered 2018-05-23: 10 mg via ORAL
  Filled 2018-05-23: qty 1

## 2018-05-23 MED ORDER — SODIUM CHLORIDE 0.9% FLUSH
3.0000 mL | Freq: Once | INTRAVENOUS | Status: AC
  Administered 2018-05-24: 3 mL via INTRAVENOUS

## 2018-05-23 NOTE — Addendum Note (Signed)
Addended by: Inis Sizer D on: 05/23/2018 01:09 PM   Modules accepted: Orders

## 2018-05-23 NOTE — ED Notes (Signed)
No answer pt not seen in lobby

## 2018-05-23 NOTE — ED Triage Notes (Signed)
Pt stated "I went to [facility] today by ambulance and waited 3 hours so we left.  I've been feeling like I have Salmonella poisoning just like I did before.  I saw the neurologist today because she's going to test me for Trigeminal Neuralgia."

## 2018-05-23 NOTE — ED Triage Notes (Signed)
Pt brought in by [facility]. Pt reports she has been feeling nauseous for the last few days. Pt was at hair salon when she started to feel like she might pass out. Pt reports abdominal pain since yesterday. Pt reports she drank a coke and laid down and it seemed to help some. Pt denies blood in vomiting or blood. Pt reports her metoprolol dose had been increased recently.

## 2018-05-23 NOTE — ED Notes (Signed)
Pt.not answering for room x2.

## 2018-05-23 NOTE — ED Provider Notes (Signed)
[facility] DEPT Provider Note   CSN: [id number] Arrival date & time: 05/23/18  2045    History   Chief Complaint Chief Complaint  Patient presents with  . Fever  . Emesis  . Abdominal Pain    HPI [name] is a 68 y.o. female who presents to the ed with cc of abdominal distension and pain. The patient has a pmh of salmonella infection and states that this feels similar. She c/o 3 days of abdominal cramping, intermittent watery diarrhea, nausea and abdominal distention.  She also thinks she has had some constipation.  Earlier she had some rectal urgency.  She denies fevers or chills, ingestion of suspicious foods, recent foreign travel or contacts with similar symptoms.     HPI  Past Medical History:  Diagnosis Date  . Allergy   . Anemia, iron deficiency 01/22/2011   May '12 - 13.8 Hgb, Nov 2, '12 Hgb 10.9   . Arthritis   . 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 CAP (community acquired pneumonia) 10/21/2016  . Chest pain in adult 07/08/2010   March 5, '09 negative cardiolite Sept 28, '10 negative nuclear stress (cardiolite) Dec 21, '11  Negative nuclear stress [facility]ling) June 26, '14 Negative nuclear stress - [place] with Dr. Bettina Gavia per patient report.  Overview:  CAD risk score is low at 3%  . Chondromalacia patellae 12/02/2012  . Chronic back pain   . Chronic kidney disease   . Chronic knee pain   . Chronic pain 09/22/2010   Multiple sources of pain: radicular pain from lumbar disease and post-operative pain; neck pain from cervical disk disease; left knee pain from trauma.  Plan - will wean off prednisone           Will continue percocet at 1  5/325 norco every 6 hours prn           NSAID - trial of meloxicam 24m g once a day           Increase gabapentin slowly to 600mg  tid and reassess           Executed a pain con  . Chronic venous insufficiency 10/10/2013  . Colitis 10/21/2016  . Collagen vascular disease ([place])   . Deformity  of joint 04/23/2013  . Depression with anxiety 09/21/2010  . Derangement of knee, left 2009   started with fall at work. Has had repair patella and torn meniscus  . DJD (degenerative joint disease) of knee 09/02/2012  . Essential hypertension 07/08/2010   Long standing hypertension medically managed. Currently seeing Dr. Daneen Schick and Dr. Marin Roberts ([facility]) for cardiology.  Meds  HCT only  Overview:  Overview:  Long standing hypertension medically managed. Currently seeing Dr. Daneen Schick and Dr. Bettina Gavia ([facility]) for cardiology.  Meds  HCT only  Last Assessment & Plan:  Formatting of this note may be different from the original. BP Readings  . Fever chills 10/21/2016  . Fibromyalgia 02/27/2017  . High cholesterol   . Hx of appendectomy   . Hypertension   . Knee internal derangement 07/08/2010   Left knee: Feb '09 medial and lateral meniscal tears by MRI                  April '09 arthroscopic surgery   . Melanoma ([place]) 11/19/2015  . Migraine headache    hormonal related - less frequent off hormone replacement  . Migraines   . Neuropathy 04/23/2013  . Peripheral cyanosis 09/28/2012   Full evaluation  by Dr. Tobie Lords for rheumatology - no evidence of Raynaud's syndrome or disease; no evidence of underlying rheumatologic disease or malignancy. Most probable cause is neurogenic.   . Pseudoarthrosis of lumbar spine 01/02/2011  . Psoriasis 04/23/2013  . Raynaud disease   . Routine health maintenance 08/17/2011   Colonoscopy - no record in EPIC of colonoscopy  Immunizations - Tdap June '13.    . Salmonella enteritis 10/23/2016  . Sepsis ([place]) 10/23/2016  . Spinal stenosis 08/14/2010   history of cervical laminectomy, lumbar diskectomy with fixation devices and redo surgery.   . Status post lumbar spinal fusion 10/02/2011  . SVT (supraventricular tachycardia) ([place])   . Synovial cyst of lumbar facet joint    lower back - s/p surgery   . Tear of lateral meniscus of left knee 09/30/2012     Patient Active Problem List   Diagnosis Date Noted  . Trigeminal neuralgia of left side of face 04/30/2018  . APC (atrial premature contractions) 02/17/2018  . PAT (paroxysmal atrial tachycardia) ([place]) 02/17/2018  . Palpitations 12/24/2017  . Upper airway cough syndrome 04/26/2017  . Fibromyalgia 02/27/2017  . Sepsis ([place]) 10/23/2016  . Salmonella enteritis 10/23/2016  . Colitis 10/21/2016  . CAP (community acquired pneumonia) 10/21/2016  . Fever chills 10/21/2016  . Melanoma ([place]) 11/19/2015  . SVT (supraventricular tachycardia) ([place]) 05/20/2015  . Chronic venous insufficiency 10/10/2013  . Neuropathy 04/23/2013  . Psoriasis 04/23/2013  . Deformity of joint 04/23/2013  . 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 Chondromalacia patellae 12/02/2012  . Peripheral cyanosis 09/28/2012  . Status post lumbar spinal fusion 10/02/2011  . Routine health maintenance 08/17/2011  . Anemia, iron deficiency 01/22/2011  . Chronic back pain 01/02/2011  . Pseudoarthrosis of lumbar spine 01/02/2011  . Chronic pain 09/22/2010  . Depression with anxiety 09/21/2010  . Spinal stenosis 08/14/2010  . Hypertensive chronic kidney disease 07/08/2010  . Migraine 07/08/2010  . Knee internal derangement 07/08/2010  . Chest pain in adult 07/08/2010    Past Surgical History:  Procedure Laterality Date  . APPENDECTOMY  1971  . CERVICAL DISCECTOMY  1999   diskectomy with fixation:plate and screws  . COLONOSCOPY    . ESOPHAGOGASTRODUODENOSCOPY  12/2014  . KNEE ARTHROSCOPY W/ MENISCAL REPAIR  09   left knee  . L4-5 lumbar fusion  September 02, 2010   Dr. Hal Neer:  minimally invasive transforaminal interbody fusion with spacer  . OVARIAN CYST SURGERY  1972  . PATELLAR REEFING  '09   repair of fractured patella after fall  . SPINE SURGERY    . SYNOVIAL CYST EXCISION  2007   lumbar spine  . VENOUS ABLATION     for pain in the groin - [facility]TS did procedure. Has some residual discomfort at the  ablation  site.   . WISDOM TOOTH EXTRACTION       OB History   No obstetric history on file.      Home Medications    Prior to Admission medications   Medication Sig Start Date End Date Taking? Authorizing Provider  amitriptyline (ELAVIL) 10 MG tablet Take 20 mg by mouth at bedtime.    Yes [provider]  aspirin EC 81 MG tablet Take 81 mg by mouth every other day.    Yes [provider]  B Complex Vitamins (VITAMIN B COMPLEX PO) Take 1 tablet by mouth daily.    Yes [provider]  cholecalciferol (VITAMIN D) 1000 units tablet Take 1,000 Units by mouth daily.   Yes  [provider]  esomeprazole (NEXIUM) 20 MG capsule Take 40 mg by mouth 2 (two) times daily.    Yes [provider]  levothyroxine (SYNTHROID, LEVOTHROID) 75 MCG tablet Take 75 mcg by mouth at bedtime.    Yes [provider]  metoprolol succinate (TOPROL-XL) 25 MG 24 hr tablet Take 1 tablet (25 mg total) by mouth 2 (two) times daily. Patient taking differently: Take 12.5-25 mg by mouth 2 (two) times daily. Currently taking 1 tablet in the morning and 0.5 tablet in the evening 01/17/18  Yes Munley, Hilton Cork, MD  Multiple Vitamins-Minerals (MULTIVITAMIN ADULT PO) Take 1 tablet by mouth daily.    Yes [provider]  sucralfate (CARAFATE) 1 G tablet Take 1 g by mouth daily. 09/03/14  Yes [provider]  tiZANidine (ZANAFLEX) 2 MG tablet Take 2 mg by mouth 2 (two) times daily. Reported on 04/05/2015   Yes [provider]  TURMERIC PO Take 2,000 mg by mouth daily.   Yes [provider]  zinc gluconate 50 MG tablet Take 50 mg by mouth daily.   Yes [provider]  cephALEXin (KEFLEX) 500 MG capsule Take 1 capsule (500 mg total) by mouth 2 (two) times daily. 05/24/18   Montine Circle, PA-C  clonazePAM (KLONOPIN) 1 MG tablet Take 1 tablet (1 mg total) by mouth at bedtime as needed for anxiety.    Norins, Heinz Knuckles, MD  diazepam (VALIUM) 5 MG  tablet Take 1-2 tablets 30 minutes prior to MRI, may repeat once as needed. Must have driver. 04/30/18   Marcial Pacas, MD  fluticasone (FLONASE) 50 MCG/ACT nasal spray Place 1 spray into both nostrils daily as needed for allergies.     [provider]  gabapentin (NEURONTIN) 100 MG capsule Take 100 mg by mouth daily as needed (nerve pain).     [provider]  Guaifenesin (MUCINEX MAXIMUM STRENGTH) 1200 MG TB12 Take 1 tablet (1,200 mg total) by mouth every 12 (twelve) hours as needed. Patient taking differently: Take 1 tablet by mouth every 12 (twelve) hours as needed. cough 07/25/16   English, Colletta [place] D, PA  Hyoscyamine Sulfate SL (LEVSIN/SL) 0.125 MG SUBL Place 0.125 mg under the tongue 4 (four) times daily. Patient not taking: Reported on 05/23/2018 04/12/16   Ivar Drape D, PA  meloxicam (MOBIC) 15 MG tablet Take 15 mg by mouth daily as needed for pain.    [provider]  traMADol (ULTRAM) 50 MG tablet Take 1 tablet (50 mg total) by mouth every 6 (six) hours as needed. Patient taking differently: Take 50 mg by mouth every 6 (six) hours as needed for moderate pain.  03/28/17   Doristine Devoid, PA-C    Family History Family History  Problem Relation Age of Onset  . Diabetes Mother   . COPD Mother   . Aortic aneurysm Mother   . Hypertension Mother   . Hypothyroidism Mother   . Cancer Father        lung  . Hyperlipidemia Father   . Hypertension Father   . Heart disease Father   . Diabetes Father   . Heart attack Father   . Diabetes Brother     Social History Social History   Tobacco Use  . Smoking status: Never Smoker  . Smokeless tobacco: Never Used  Substance Use Topics  . Alcohol use: No  . Drug use: No     Allergies   Epinephrine; Nsaids; Other; Pregabalin; Tolmetin; Prednisone; Tape; Tapentadol; Captopril; Lisinopril;  Caine-1 [lidocaine hcl]; Celecoxib; Codeine; Lidocaine hcl; Procaine; Sulfa antibiotics; and Sulfasalazine   Review  of Systems Review of Systems Ten systems reviewed and are negative for acute change, except as noted in the HPI.    Physical Exam Updated Vital Signs BP 136/80 (BP Location: Right Arm)   Pulse 91   Temp 99.7 F (37.6 C) (Oral)   Resp 17   SpO2 94%   Physical Exam Vitals signs and nursing note reviewed.  Constitutional:      General: She is not in acute distress.    Appearance: She is well-developed. She is not diaphoretic.  HENT:     Head: Normocephalic and atraumatic.  Eyes:     General: No scleral icterus.    Conjunctiva/sclera: Conjunctivae normal.  Neck:     Musculoskeletal: Normal range of motion.  Cardiovascular:     Rate and Rhythm: Normal rate and regular rhythm.     Heart sounds: Normal heart sounds. No murmur. No friction rub. No gallop.   Pulmonary:     Effort: Pulmonary effort is normal. No respiratory distress.     Breath sounds: Normal breath sounds.  Abdominal:     General: Bowel sounds are normal. There is no distension.     Palpations: Abdomen is soft. There is no mass.     Tenderness: There is no abdominal tenderness. There is no guarding.  Skin:    General: Skin is warm and dry.  Neurological:     Mental Status: She is alert and oriented to person, place, and time.  Psychiatric:        Behavior: Behavior normal.      ED Treatments / Results  Labs (all labs ordered are listed, but only abnormal results are displayed) Labs Reviewed  GASTROINTESTINAL PANEL BY PCR, STOOL (REPLACES STOOL CULTURE) - Abnormal; Notable for the following components:      Result Value   Norovirus GI/GII DETECTED (*)    All other components within normal limits  URINALYSIS, ROUTINE W REFLEX MICROSCOPIC - Abnormal; Notable for the following components:   Specific Gravity, Urine 1.042 (*)    Glucose, UA >=500 (*)    Hgb urine dipstick SMALL (*)    Ketones, ur 5 (*)    Leukocytes,Ua MODERATE (*)    WBC, UA >50 (*)    Bacteria, UA FEW (*)    All other components  within normal limits  COMPREHENSIVE METABOLIC PANEL - Abnormal; Notable for the following components:   Glucose, Bld 101 (*)    Calcium 8.6 (*)    All other components within normal limits  CBC  LIPASE, BLOOD    EKG None  Radiology No results found.  Procedures Procedures (including critical care time)  Medications Ordered in ED Medications  sodium chloride flush (NS) 0.9 % injection 3 mL (3 mLs Intravenous Given 05/24/18 0059)  ondansetron (ZOFRAN-ODT) disintegrating tablet 4 mg (4 mg Oral Given 05/23/18 2126)  iohexol (OMNIPAQUE) 300 MG/ML solution 30 mL (30 mLs Oral Contrast Given 05/23/18 2200)  dicyclomine (BENTYL) capsule 10 mg (10 mg Oral Given 05/23/18 2239)  cefTRIAXone (ROCEPHIN) 1 g in sodium chloride 0.9 % 100 mL IVPB (0 g Intravenous Stopped 05/24/18 0007)  iopamidol (ISOVUE-300) 61 % injection 100 mL (100 mLs Intravenous Contrast Given 05/24/18 0022)     Initial Impression / Assessment and Plan / ED Course  I have reviewed the triage vital signs and the nursing notes.  Pertinent labs & imaging results that were available during my care of  the patient were reviewed by me and considered in my medical decision making (see chart for details).  Clinical Course as of May 25 801  Thu May 23, 2018  2228 Appears to have UTI.  Urinalysis, Routine w reflex microscopic(!) [AH]  2229 Temp(!): 94 F (34.4 C) [AH]    Clinical Course User Index [AH] Margarita Mail, PA-C       Patient here with symptoms consistent more likely with viral gastroenteritis with nausea vomiting diarrhea domino cramping.  She is given Bentyl.  Patient had a temperature documented at 94.  Of asked the oncoming nurses to recheck this temperature.  She has a CT pending.  Urine does show a probable urinary tract infection I have given the patient Rocephin.  She has provided a sample for GI pathogens.  Patient's does not have an elevated white blood cell count. CMP shows mildly elevated blood glucose  slightly low her calcium do not think that these are clinically significant.  Lipase is normal.  I have given sign out to PA Montine Circle who will assume care of the patient.  Final Clinical Impressions(s) / ED Diagnoses   Final diagnoses:  Diarrhea, unspecified type  Abnormal urinalysis    ED Discharge Orders         Ordered    cephALEXin (KEFLEX) 500 MG capsule  2 times daily     05/24/18 0236           Margarita Mail, PA-C 05/26/18 [place], Dan, DO 05/26/18 1610

## 2018-05-24 ENCOUNTER — Encounter (HOSPITAL_COMMUNITY): Payer: Self-pay

## 2018-05-24 ENCOUNTER — Other Ambulatory Visit: Payer: Medicare Other

## 2018-05-24 DIAGNOSIS — R110 Nausea: Secondary | ICD-10-CM | POA: Diagnosis not present

## 2018-05-24 DIAGNOSIS — R109 Unspecified abdominal pain: Secondary | ICD-10-CM | POA: Diagnosis not present

## 2018-05-24 LAB — CREATININE, SERUM
Creatinine, Ser: 1.02 mg/dL — ABNORMAL HIGH (ref 0.57–1.00)
GFR calc Af Amer: 66 mL/min/{1.73_m2} (ref >59)
GFR calc non Af Amer: 57 mL/min/{1.73_m2} — ABNORMAL LOW (ref >59)

## 2018-05-24 LAB — GASTROINTESTINAL PANEL BY PCR, STOOL (REPLACES STOOL CULTURE)

## 2018-05-24 LAB — COMPREHENSIVE METABOLIC PANEL
ALT: 42 U/L (ref 0–44)
AST: 40 U/L (ref 15–41)
Albumin: 3.6 g/dL (ref 3.5–5.0)
Alkaline Phosphatase: 93 U/L (ref 38–126)
Anion gap: 9 (ref 5–15)
BUN: 15 mg/dL (ref 8–23)
CO2: 26 mmol/L (ref 22–32)
Calcium: 8.6 mg/dL — ABNORMAL LOW (ref 8.9–10.3)
Chloride: 100 mmol/L (ref 98–111)
Creatinine, Ser: 0.91 mg/dL (ref 0.44–1.00)
GFR calc Af Amer: >60 mL/min (ref >60)
GFR calc non Af Amer: >60 mL/min (ref >60)
Glucose, Bld: 101 mg/dL — ABNORMAL HIGH (ref 70–99)
Potassium: 4 mmol/L (ref 3.5–5.1)
Sodium: 135 mmol/L (ref 135–145)
Total Bilirubin: 0.5 mg/dL (ref 0.3–1.2)
Total Protein: 6.9 g/dL (ref 6.5–8.1)

## 2018-05-24 LAB — LIPASE, BLOOD: Lipase: 27 U/L (ref 11–51)

## 2018-05-24 LAB — BUN: BUN: 13 mg/dL (ref 8–27)

## 2018-05-24 MED ORDER — CEPHALEXIN 500 MG PO CAPS: 500.0000 mg | ORAL_CAPSULE | Freq: Two times a day (BID) | ORAL | 0 refills | Status: DC

## 2018-05-24 MED ORDER — SODIUM CHLORIDE (PF) 0.9 % IJ SOLN
INTRAMUSCULAR | Status: AC
  Filled 2018-05-24: qty 50

## 2018-05-24 MED ORDER — IOPAMIDOL (ISOVUE-300) INJECTION 61%
100.0000 mL | Freq: Once | INTRAVENOUS | Status: AC | PRN
  Administered 2018-05-24: 100 mL via INTRAVENOUS

## 2018-05-24 MED ORDER — IOPAMIDOL (ISOVUE-300) INJECTION 61%
INTRAVENOUS | Status: AC
  Filled 2018-05-24: qty 100

## 2018-05-24 NOTE — ED Provider Notes (Signed)
Patient signed out to me at shift change.  Patient pending CT.  Concern for possible colitis.  CT shows no acute abdominal obstruction or inflammation.  No evidence of colitis.  Patient will be prescribed Keflex for abnormal urinalysis.  Recommend PCP follow-up.  GI pathogen panel pending.   Montine Circle, PA-C 05/24/18 [place], Orin, DO 05/25/18 1503

## 2018-05-27 ENCOUNTER — Ambulatory Visit
Admission: RE | Admit: 2018-05-27 | Discharge: 2018-05-27 | Disposition: A | Discharge status: Home / Self Care | Payer: Medicare Other | Source: Ambulatory Visit | Attending: Neurology | Admitting: Neurology

## 2018-05-27 ENCOUNTER — Other Ambulatory Visit: Payer: Self-pay

## 2018-05-27 DIAGNOSIS — G500 Trigeminal neuralgia: Secondary | ICD-10-CM | POA: Diagnosis not present

## 2018-05-27 MED ORDER — GADOBENATE DIMEGLUMINE 529 MG/ML IV SOLN
20.0000 mL | Freq: Once | INTRAVENOUS | Status: AC | PRN
  Administered 2018-05-27: 20 mL via INTRAVENOUS

## 2018-05-29 ENCOUNTER — Other Ambulatory Visit: Payer: Self-pay | Admitting: Orthopaedic Surgery

## 2018-05-29 DIAGNOSIS — G894 Chronic pain syndrome: Secondary | ICD-10-CM

## 2018-06-03 ENCOUNTER — Telehealth (INDEPENDENT_AMBULATORY_CARE_PROVIDER_SITE_OTHER): Payer: Medicare Other | Admitting: Neurology

## 2018-06-03 DIAGNOSIS — G500 Trigeminal neuralgia: Secondary | ICD-10-CM | POA: Insufficient documentation

## 2018-06-03 NOTE — Progress Notes (Signed)
Virtual Visit via Telephone Note  I connected with [name] on 06/03/18 at  by telephone and verified that I am speaking with the correct person using two identifiers.   I discussed the limitations, risks, security and privacy concerns of performing an evaluation and management service by telephone and the availability of in person appointments. I also discussed with the patient that there may be a patient responsible charge related to this service. The patient expressed understanding and agreed to proceed.   History of Present Illness: [name] is a 68 year old female, seen in request by her primary care physician Dr. Maurine Cane for evaluation of left facial pain, initial evaluation was on April 30, 2018.  I have reviewed and summarized the referring note from the referring physician.  She had past medical history of hypertension, migraine headache, chronic kidney disease, history of cervical decompression in 1999, lumbar spine synovial cyst decompression and fusion in 2007, also had L4-5 lumbar fusion in 2012, she had a residual right foot drop, numbness since second lumbar decompression surgery in 2012, L3-S1 reversal laminectomy and fusion in 2016  Since November 2019, she had recurrent episode of radiating pain at her left face, initially felt like little finger in the light socket, sharp radiating pain from left jaw to left ear, sensitivity involving left upper and lower teeth, multiple episode during the day, she has some gabapentin, which does help her symptoms, but 300 mg of gabapentin make her dizzy, sleepy,  She was seen by dentist initially, was treated with antibiotic, consider possibility of root canal, but there was no dental pathology found, were symptoms last about 2 weeks, now has much improved, no longer has recurrent radiating pain since January 2020,  I personally reviewed MRI of lumbar in March 2019, L3-4 PLIF, adjacent segment degeneration at  L2-3 with retrolisthesis, L2-3 mild spinal and foraminal stenosis, L3 to sacrum solid arthrodesis,  Laboratory evaluation showed normal CMP, CBC  Observations/Objective: I have reviewed problem lists, medications, allergies.  Patient emailed as concerning about her MRI findings, I have personally reviewed MRI of the brain on May 27, 2018, there was described a few punctuate foci of nonspecific gliosis, otherwise normal MRI of the brain.  Her left facial pain overall has much improved, gabapentin 100 mg as needed was helpful, but she still has intermittent sensitivity of her left jaw, she tried to avoiding chewing on her left side  Assessment and Plan: Left facial radiating pain  History most consistent with left trigeminal neuralgia  MRI of the brain with without contrast on May 27, 2018 was essentially normal  Gabapentin 100 to 300 mg as needed   Follow Up Instructions:  Only return to clinic as needed    I discussed the assessment and treatment plan with the patient. The patient was provided an opportunity to ask questions and all were answered. The patient agreed with the plan and demonstrated an understanding of the instructions.   The patient was advised to call back or seek an in-person evaluation if the symptoms worsen or if the condition fails to improve as anticipated.  I provided 11 minutes of non-face-to-face time during this encounter.        Marcial Pacas, MD

## 2018-06-20 ENCOUNTER — Other Ambulatory Visit: Payer: Medicare Other

## 2018-06-24 ENCOUNTER — Other Ambulatory Visit: Payer: Self-pay | Admitting: Cardiology

## 2018-06-24 DIAGNOSIS — I129 Hypertensive chronic kidney disease with stage 1 through stage 4 chronic kidney disease, or unspecified chronic kidney disease: Secondary | ICD-10-CM

## 2018-06-24 DIAGNOSIS — N181 Chronic kidney disease, stage 1: Principal | ICD-10-CM

## 2018-06-27 ENCOUNTER — Telehealth: Payer: Self-pay

## 2018-06-27 NOTE — Telephone Encounter (Signed)
Spoke with the patient has given verbal consent to do a webex visit and to file her insurance. E-mail has been confirmed.

## 2018-07-01 NOTE — Progress Notes (Deleted)
Virtual Visit via Video Note  I connected with [name] on 07/01/18 at 10:15 AM EDT by a video enabled telemedicine application and verified that I am speaking with the correct person using two identifiers.   I discussed the limitations of evaluation and management by telemedicine and the availability of in person appointments. The patient expressed understanding and agreed to proceed.  History of Present Illness:    Observations/Objective:   Assessment and Plan:   Follow Up Instructions:    I discussed the assessment and treatment plan with the patient. The patient was provided an opportunity to ask questions and all were answered. The patient agreed with the plan and demonstrated an understanding of the instructions.   The patient was advised to call back or seek an in-person evaluation if the symptoms worsen or if the condition fails to improve as anticipated.  I provided *** minutes of non-face-to-face time during this encounter.   Suzzanne Cloud, NP

## 2018-07-02 ENCOUNTER — Telehealth: Payer: Self-pay | Admitting: Neurology

## 2018-07-02 ENCOUNTER — Ambulatory Visit: Payer: Medicare Other | Admitting: Neurology

## 2018-07-02 NOTE — Telephone Encounter (Signed)
I called the patient, she was on my schedule today for a virtual visit.  She just had a visit with Dr. Krista Blue on June 03, 2018.  At that time they did discuss MRI, gabapentin treatment for her trigeminal neuralgia.  According to Dr. Rhea Belton note, she was to follow-up on as-needed basis.  I called the patient, she reports no new concerns.  She reports that Dr. Krista Blue reviewed MRI with her at their visit.  She is not having any new problems or concerns.  She will follow-up in this office on an as-needed basis.  I advised her to get in touch with myself or Dr. Krista Blue if she has any new problems or concerns. She verbalized understanding, appreciated the call.

## 2018-07-30 DIAGNOSIS — Z8349 Family history of other endocrine, nutritional and metabolic diseases: Secondary | ICD-10-CM | POA: Diagnosis not present

## 2018-07-30 DIAGNOSIS — Z92241 Personal history of systemic steroid therapy: Secondary | ICD-10-CM | POA: Diagnosis not present

## 2018-07-30 DIAGNOSIS — M8588 Other specified disorders of bone density and structure, other site: Secondary | ICD-10-CM | POA: Diagnosis not present

## 2018-07-30 DIAGNOSIS — E039 Hypothyroidism, unspecified: Secondary | ICD-10-CM | POA: Diagnosis not present

## 2018-08-07 ENCOUNTER — Other Ambulatory Visit: Payer: Medicare Other

## 2018-08-13 ENCOUNTER — Ambulatory Visit
Admission: RE | Admit: 2018-08-13 | Discharge: 2018-08-13 | Disposition: A | Discharge status: Home / Self Care | Payer: Medicare Other | Source: Ambulatory Visit | Attending: Orthopaedic Surgery | Admitting: Orthopaedic Surgery

## 2018-08-13 ENCOUNTER — Other Ambulatory Visit: Payer: Self-pay

## 2018-08-13 DIAGNOSIS — G894 Chronic pain syndrome: Secondary | ICD-10-CM

## 2018-08-13 DIAGNOSIS — M48061 Spinal stenosis, lumbar region without neurogenic claudication: Secondary | ICD-10-CM | POA: Diagnosis not present

## 2018-08-13 DIAGNOSIS — E039 Hypothyroidism, unspecified: Secondary | ICD-10-CM | POA: Diagnosis not present

## 2018-09-02 DIAGNOSIS — Z85820 Personal history of malignant melanoma of skin: Secondary | ICD-10-CM | POA: Diagnosis not present

## 2018-09-02 DIAGNOSIS — M4326 Fusion of spine, lumbar region: Secondary | ICD-10-CM | POA: Diagnosis not present

## 2018-09-02 DIAGNOSIS — G894 Chronic pain syndrome: Secondary | ICD-10-CM | POA: Diagnosis not present

## 2018-09-02 DIAGNOSIS — L812 Freckles: Secondary | ICD-10-CM | POA: Diagnosis not present

## 2018-09-02 DIAGNOSIS — L720 Epidermal cyst: Secondary | ICD-10-CM | POA: Diagnosis not present

## 2018-09-02 DIAGNOSIS — L821 Other seborrheic keratosis: Secondary | ICD-10-CM | POA: Diagnosis not present

## 2018-09-02 DIAGNOSIS — D225 Melanocytic nevi of trunk: Secondary | ICD-10-CM | POA: Diagnosis not present

## 2018-09-02 DIAGNOSIS — L570 Actinic keratosis: Secondary | ICD-10-CM | POA: Diagnosis not present

## 2018-09-02 DIAGNOSIS — Z6836 Body mass index (BMI) 36.0-36.9, adult: Secondary | ICD-10-CM | POA: Diagnosis not present

## 2018-09-02 DIAGNOSIS — D1801 Hemangioma of skin and subcutaneous tissue: Secondary | ICD-10-CM | POA: Diagnosis not present

## 2018-09-12 DIAGNOSIS — R350 Frequency of micturition: Secondary | ICD-10-CM | POA: Diagnosis not present

## 2018-09-27 DIAGNOSIS — Z7189 Other specified counseling: Secondary | ICD-10-CM | POA: Diagnosis not present

## 2018-09-27 DIAGNOSIS — Z03818 Encounter for observation for suspected exposure to other biological agents ruled out: Secondary | ICD-10-CM | POA: Diagnosis not present

## 2018-10-10 DIAGNOSIS — Z1231 Encounter for screening mammogram for malignant neoplasm of breast: Secondary | ICD-10-CM | POA: Diagnosis not present

## 2018-10-22 ENCOUNTER — Telehealth: Payer: Self-pay | Admitting: Cardiology

## 2018-10-22 DIAGNOSIS — I129 Hypertensive chronic kidney disease with stage 1 through stage 4 chronic kidney disease, or unspecified chronic kidney disease: Secondary | ICD-10-CM

## 2018-10-22 DIAGNOSIS — N6489 Other specified disorders of breast: Secondary | ICD-10-CM | POA: Diagnosis not present

## 2018-10-22 DIAGNOSIS — R922 Inconclusive mammogram: Secondary | ICD-10-CM | POA: Diagnosis not present

## 2018-10-23 MED ORDER — ASPIRIN EC 81 MG PO TBEC: 81.0000 mg | DELAYED_RELEASE_TABLET | Freq: Every day | ORAL | Status: DC

## 2018-10-23 MED ORDER — METOPROLOL SUCCINATE ER 25 MG PO TB24: 25.0000 mg | ORAL_TABLET | Freq: Two times a day (BID) | ORAL | 1 refills | Status: DC

## 2018-10-23 NOTE — Telephone Encounter (Signed)
Patient states she has been experiencing shortness of breath on exertion for at least a month and it continues to worsen. She went for COVID testing and it came back negative. Patient reports swelling in her upper and lower extremities and her abdomen and states she feels lightheaded/dizzy on occasion. Patient states, "I am wondering if I am developing congestive heart failure." She woke up lightheaded this morning and is still experiencing this. Her BP is 153/75 and HR is 83 (wrist cuff). She is taking her medications as prescribed. Please advise. Thanks!

## 2018-10-23 NOTE — Telephone Encounter (Signed)
She is due for follow up. Go ahead and schedule her for next available appointment please.

## 2018-10-23 NOTE — Telephone Encounter (Signed)
Patient has been scheduled to see Dr. Bettina Gavia tomorrow, 10/24/2018, at 2:55 pm in the [facility] office. Patient is agreeable and will arrive 15 minutes early for check in. No further questions.

## 2018-10-24 ENCOUNTER — Other Ambulatory Visit: Payer: Self-pay

## 2018-10-24 ENCOUNTER — Ambulatory Visit (HOSPITAL_BASED_OUTPATIENT_CLINIC_OR_DEPARTMENT_OTHER)
Admission: RE | Admit: 2018-10-24 | Discharge: 2018-10-24 | Disposition: A | Discharge status: Home / Self Care | Payer: Medicare Other | Source: Ambulatory Visit | Attending: Cardiology | Admitting: Cardiology

## 2018-10-24 ENCOUNTER — Ambulatory Visit (INDEPENDENT_AMBULATORY_CARE_PROVIDER_SITE_OTHER): Payer: Medicare Other | Admitting: Cardiology

## 2018-10-24 ENCOUNTER — Encounter: Payer: Self-pay | Admitting: Cardiology

## 2018-10-24 VITALS — BP 130/86 | HR 77 | Ht 65.0 in | Wt 238.1 lb

## 2018-10-24 DIAGNOSIS — R0602 Shortness of breath: Secondary | ICD-10-CM | POA: Diagnosis not present

## 2018-10-24 DIAGNOSIS — I491 Atrial premature depolarization: Secondary | ICD-10-CM | POA: Diagnosis not present

## 2018-10-24 DIAGNOSIS — I519 Heart disease, unspecified: Secondary | ICD-10-CM

## 2018-10-24 DIAGNOSIS — I5189 Other ill-defined heart diseases: Secondary | ICD-10-CM

## 2018-10-24 DIAGNOSIS — I471 Supraventricular tachycardia: Secondary | ICD-10-CM

## 2018-10-24 MED ORDER — POTASSIUM CHLORIDE ER 10 MEQ PO TBCR: EXTENDED_RELEASE_TABLET | ORAL | 3 refills | Status: DC

## 2018-10-24 MED ORDER — FUROSEMIDE 20 MG PO TABS: ORAL_TABLET | ORAL | 3 refills | Status: DC

## 2018-10-24 NOTE — Progress Notes (Signed)
Cardiology Office Note:    Date:  10/24/2018   ID:  [name], DOB 13-Aug-1950, MRN [id number]  PCP:  Kelton Pillar, MD  Cardiologist:  Shirlee More, MD    Referring MD: Kelton Pillar, MD    ASSESSMENT:    1. Shortness of breath   2. PAC (premature atrial contraction)   3. PAT (paroxysmal atrial tachycardia) ([name])   4. Grade I diastolic dysfunction    PLAN:    In order of problems listed above:  1. PAC/PAT - Noted history. Denies new or worsening palpitations. Reviewed EKGs done on her Apple Watch while in the room with her which show NSR. Continue metoprolol. 2. SOB - Reports 1 month onset SOB with activity such as walking upstairs. Additionally notices breathlessness while using her lymphedema pump, tells me it feels like the fluid is pushed up into her lungs. Reports orthopnea and adding additional pillows to sleep. Reports intermittent non-productive cough worse at night. Has been tested for COVID which was negative. Not volume overloaded on exam. Echo 12/2017 with normal EF and gr1DD. Etiology HF vs deconditioning vs URI. Plan echocardiogram, ProBNP, BMET, CXR.  3. LE edema - Intermittent. Etiology lymphedema vs gr1DD vs HF. Plan as above. Add Lasix 20mg  Monday, Wednesday, Friday. Will add KDur 10mg  on days she takes lasix to prevent hypokalemia.  4. Grade 1 diastolic dysfunction - Noted on echo 12/2017. Repeat echocardiogram. 5. Lymphedema - Noted history. Uses her lymphedema pump twice daily. No edema noted on exam today.   Next appointment: 6 weeks   Medication Adjustments/Labs and Tests Ordered: Current medicines are reviewed at length with the patient today.  Concerns regarding medicines are outlined above.  No orders of the defined types were placed in this encounter.  No orders of the defined types were placed in this encounter.   No chief complaint on file.   History of Present Illness:    [name] is a 68 y.o. female with a hx of  palpitations, HTN, CKD, chronic pain, hypothyroidism, abdominal aorta therosclerosis last seen 02/18/2018. Echo 12/25/2017 was normal. An extended 14-day ZIO monitor was performed which showed occasional atrial arrthmia, 4.5% frequent APCs, and brief runs of APCs consistent with atrial tachycardia and there were 8 symptomatic events of atrial arrhythmia.   She presents today for follow up with chief complaint of SOB.   Reports 1 month onset shortness of breath. Also notices 1 month onset intermittent dry cough worse at night. She tells me she has been tested for covid which was negative. She notices shortness of breath with activities such as walking the stairs, brining in groceries. Uses lymphedema pump twice daily on her legs and feels breathless while doing this as if the fluid is being "pushed up into my chest".   Notes intermittent LE edema, but tells me this is relatively unchanged from her stable lymphedema.   She reports orthopnea. Has added additional pillows to sleep.   She denies chest pain, pressure ,heaviness. Tells me her palpitations are at her baseline. Has been monitoring EKG with her Apple Watch. Reviewed in the room with her and they showed NSR. Reports some dizziness with positional changes.   Shares with me that she is awaiting a biopsy of her L breast and is understandably worried, reassurance provided.  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 Bettina Gavia per patient report.  Overview:  CAD risk score is low at  3%   Chondromalacia patellae 12/02/2012   Chronic back pain    Chronic kidney disease    Chronic knee pain    Chronic pain 09/22/2010   Multiple sources of pain: radicular pain from lumbar disease and post-operative pain; neck pain from cervical disk disease; left knee pain from trauma.  Plan - will wean off prednisone           Will continue percocet at 1  5/325 norco every 6 hours prn           NSAID - trial of meloxicam 49m g once a day           Increase gabapentin slowly to 600mg  tid and reassess           Executed a pain con   Chronic venous insufficiency 10/10/2013   Colitis 10/21/2016   Collagen vascular disease ([place])    Deformity of joint 04/23/2013   Depression with anxiety 09/21/2010   Derangement of knee, left 2009   started with fall at work. Has had repair patella and torn meniscus   DJD (degenerative joint disease) of knee 09/02/2012   Essential hypertension 07/08/2010   Long standing hypertension medically managed. Currently seeing Dr. Daneen Schick and Dr. Marin Roberts ([facility]) for cardiology.  Meds  HCT only  Overview:  Overview:  Long standing hypertension medically managed. Currently seeing Dr. Daneen Schick and Dr. Bettina Gavia ([facility]) for cardiology.  Meds  HCT only  Last Assessment & Plan:  Formatting of this note may be different from the original. BP Readings   Fever chills 10/21/2016   Fibromyalgia 02/27/2017   High cholesterol    Hx of appendectomy    Hypertension    Knee internal derangement 07/08/2010   Left knee: Feb '09 medial and lateral meniscal tears by MRI                  April '09 arthroscopic surgery    Melanoma ([place]) 11/19/2015   Migraine headache    hormonal related - less frequent off hormone replacement   Migraines    Neuropathy 04/23/2013   Peripheral cyanosis 09/28/2012   Full evaluation by Dr. Tobie Lords for rheumatology - no evidence of Raynaud's syndrome or disease; no evidence of underlying rheumatologic disease or malignancy.  Most probable cause is neurogenic.    Pseudoarthrosis of lumbar spine 01/02/2011   Psoriasis 04/23/2013   Raynaud disease    Routine health maintenance 08/17/2011   Colonoscopy - no record in EPIC of colonoscopy  Immunizations - Tdap June '13.     Salmonella enteritis 10/23/2016   Sepsis ([place]) 10/23/2016   Spinal stenosis 08/14/2010   history of cervical laminectomy, lumbar diskectomy with fixation devices and redo surgery.    Status post lumbar spinal fusion 10/02/2011   SVT (supraventricular tachycardia) ([facility])    Synovial cyst of lumbar facet joint    lower back - s/p surgery    Tear of lateral meniscus of left knee 09/30/2012    Past Surgical History:  Procedure Laterality Date   APPENDECTOMY  1971   CERVICAL DISCECTOMY  1999   diskectomy with fixation:plate and screws   COLONOSCOPY     ESOPHAGOGASTRODUODENOSCOPY  12/2014   KNEE ARTHROSCOPY W/ MENISCAL REPAIR  09   left knee   L4-5 lumbar fusion  September 02, 2010   Dr. Hal Neer:  minimally invasive transforaminal interbody fusion with spacer   OVARIAN CYST SURGERY  1972   PATELLAR REEFING  '09   repair of fractured patella after fall   [place]  2007   lumbar spine   VENOUS ABLATION     for pain in the groin - [facility] did procedure. Has some residual discomfort at the  ablation site.    WISDOM TOOTH EXTRACTION      Current Medications: No outpatient medications have been marked as taking for the 10/24/18 encounter (Appointment) with Richardo Priest, MD.     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    Employer: [employer] resource strain: Not on file   Food insecurity    Worry: Not on file    Inability: Not on file   Transportation needs    Medical: Not on file    Non-medical: Not on file  Tobacco Use   Smoking status: Never Smoker   Smokeless tobacco: Never Used  Substance and Sexual Activity   Alcohol use: No   Drug use: No   Sexual activity: Yes    Partners: Male    Birth control/protection: None  Lifestyle   Physical activity    Days per week: Not on file    Minutes per session: Not on file   Stress: Not on file  Relationships   Social connections    Talks on phone: Not on file    Gets together: Not on file    Attends religious service: Not on file    Active member of club or organization: Not on file    Attends meetings of clubs or organizations: Not on file    Relationship status: Not on file  Other Topics Concern   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 1 dtr- '72; 1 son '73; 4 grandchildren, 2 step-grands. 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   Right handed   Lives at home with spouse    2-3 cups daily of caffeine      Family History: The patient's family history includes Aortic aneurysm in her mother; COPD in her mother; Cancer in her father; Diabetes in her brother, father, and mother; Heart attack in her father; Heart disease in her father; Hyperlipidemia in her father; Hypertension in her father and mother; Hypothyroidism in her mother. ROS:   Please see the history of present illness.    All other systems reviewed and are negative.  EKGs/Labs/Other Studies Reviewed:    Echo 12/2017 The following studies were reviewed today: - Left ventricle: The cavity size was normal. Wall thickness was  increased in a  pattern  of mild LVH. Systolic function was normal.   The estimated ejection fraction was in the range of 60% to 65%.   Wall motion was normal; there were no regional wall motion  abnormalities. Doppler parameters are consistent with abnormal  left ventricular relaxation (grade 1 diastolic dysfunction). - Pulmonary arteries: PA peak pressure: 34 mm Hg (S).   EKG:  No EKG today.  Recent Labs: 05/23/2018: Hemoglobin 14.3; Platelets 233 05/24/2018: ALT 42; BUN 15; Creatinine, Ser 0.91; Potassium 4.0; Sodium 135  Recent Lipid Panel    Component Value Date/Time   CHOL 189 12/07/2011 1252   TRIG 127.0 12/07/2011 1252   HDL 49.30 12/07/2011 1252   CHOLHDL 4 12/07/2011 1252   VLDL 25.4 12/07/2011 1252   LDLCALC 114 (H) 12/07/2011 1252    Physical Exam:    VS:  There were no vitals taken for this visit.    Wt Readings from Last 3 Encounters:  05/23/18 222 lb (100.7 kg)  04/30/18 232 lb (105.2 kg)  02/18/18 225 lb 12.8 oz (102.4 kg)     GEN:  Well nourished, overweight, well developed in no acute distress HEENT: Normal NECK: No JVD; No carotid bruits LYMPHATICS: No lymphadenopathy CARDIAC: RRR, no murmurs, rubs, gallops RESPIRATORY:  Clear to auscultation without rales, wheezing or rhonchi  ABDOMEN: Soft, non-tender, non-distended MUSCULOSKELETAL:  No edema; No deformity  SKIN: Warm and dry NEUROLOGIC:  Alert and oriented x 3 PSYCHIATRIC:  Normal affect    Signed, Shirlee More, MD  10/24/2018 1:29 PM    [facility]

## 2018-10-24 NOTE — Patient Instructions (Addendum)
Medication Instructions:  Your physician has recommended you make the following change in your medication:   START Lasix 20mg  (one tablet) Monday, Wednesday, Friday START Potassium Chloride (KDur) 10 mEq (one tablet) Monday, Wednesday, Friday  If you need a refill on your cardiac medications before your next appointment, please call your pharmacy.   Lab work: Your physician recommends that you return for lab work today: ProBNP, BMET  If you have labs (blood work) drawn today and your tests are completely normal, you will receive your results only by: Marland Kitchen MyChart Message (if you have MyChart) OR . A paper copy in the mail If you have any lab test that is abnormal or we need to change your treatment, we will call you to review the results.  Testing/Procedures: Your physician has requested that you have an echocardiogram. Echocardiography is a painless test that uses sound waves to create images of your heart. It provides your doctor with information about the size and shape of your heart and how well your heart's chambers and valves are working. This procedure takes approximately one hour. There are no restrictions for this procedure.  A chest x-ray takes a picture of the organs and structures inside the chest, including the heart, lungs, and blood vessels. This test can show several things, including, whether the heart is enlarges; whether fluid is building up in the lungs; and whether pacemaker / defibrillator leads are still in place.   Follow-Up: At [facility], you and your health needs are our priority.  As part of our continuing mission to provide you with exceptional heart care, we have created designated Provider Care Teams.  These Care Teams include your primary Cardiologist (physician) and Advanced Practice Providers (APPs -  Physician Assistants and Nurse Practitioners) who all work together to provide you with the care you need, when you need it. You will need a follow up  appointment in 6 weeks.   Any Other Special Instructions Will Be Listed Below (If Applicable).

## 2018-10-25 ENCOUNTER — Ambulatory Visit (HOSPITAL_BASED_OUTPATIENT_CLINIC_OR_DEPARTMENT_OTHER)
Admission: RE | Admit: 2018-10-25 | Discharge: 2018-10-25 | Disposition: A | Discharge status: Home / Self Care | Payer: Medicare Other | Source: Ambulatory Visit | Attending: Cardiology | Admitting: Cardiology

## 2018-10-25 DIAGNOSIS — I471 Supraventricular tachycardia: Secondary | ICD-10-CM | POA: Insufficient documentation

## 2018-10-25 DIAGNOSIS — I519 Heart disease, unspecified: Secondary | ICD-10-CM | POA: Diagnosis not present

## 2018-10-25 DIAGNOSIS — R0602 Shortness of breath: Secondary | ICD-10-CM | POA: Diagnosis not present

## 2018-10-25 LAB — BASIC METABOLIC PANEL
BUN/Creatinine Ratio: 16 (ref 12–28)
BUN: 16 mg/dL (ref 8–27)
CO2: 26 mmol/L (ref 20–29)
Calcium: 9.5 mg/dL (ref 8.7–10.3)
Chloride: 100 mmol/L (ref 96–106)
Creatinine, Ser: 0.98 mg/dL (ref 0.57–1.00)
GFR calc Af Amer: 69 mL/min/{1.73_m2} (ref >59)
GFR calc non Af Amer: 60 mL/min/{1.73_m2} (ref >59)
Glucose: 105 mg/dL — ABNORMAL HIGH (ref 65–99)
Potassium: 4.5 mmol/L (ref 3.5–5.2)
Sodium: 141 mmol/L (ref 134–144)

## 2018-10-25 LAB — PRO B NATRIURETIC PEPTIDE: NT-Pro BNP: 145 pg/mL (ref 0–301)

## 2018-10-25 NOTE — Progress Notes (Signed)
  Echocardiogram 2D Echocardiogram has been performed.   [name] 10/25/2018, 4:05 PM

## 2018-10-29 ENCOUNTER — Telehealth: Payer: Self-pay | Admitting: Cardiology

## 2018-10-29 NOTE — Telephone Encounter (Signed)
No epinephrine

## 2018-10-29 NOTE — Telephone Encounter (Signed)
Patient has her biopsy tomorrow at 1:45, please let her know something tomorrow morning as soon as possible due to numbing agent they will need to use.

## 2018-10-29 NOTE — Telephone Encounter (Signed)
Patient is asking for echo results and to be put in [place] for other physicians.

## 2018-10-30 ENCOUNTER — Other Ambulatory Visit: Payer: Self-pay | Admitting: Radiology

## 2018-10-30 DIAGNOSIS — N6012 Diffuse cystic mastopathy of left breast: Secondary | ICD-10-CM | POA: Diagnosis not present

## 2018-10-30 DIAGNOSIS — N6321 Unspecified lump in the left breast, upper outer quadrant: Secondary | ICD-10-CM | POA: Diagnosis not present

## 2018-10-30 NOTE — Telephone Encounter (Signed)
Patient advised No Epinephrine

## 2018-10-31 DIAGNOSIS — M545 Low back pain: Secondary | ICD-10-CM | POA: Diagnosis not present

## 2018-10-31 DIAGNOSIS — G894 Chronic pain syndrome: Secondary | ICD-10-CM | POA: Diagnosis not present

## 2018-10-31 DIAGNOSIS — M4326 Fusion of spine, lumbar region: Secondary | ICD-10-CM | POA: Diagnosis not present

## 2018-10-31 DIAGNOSIS — M47896 Other spondylosis, lumbar region: Secondary | ICD-10-CM | POA: Diagnosis not present

## 2018-11-11 ENCOUNTER — Other Ambulatory Visit: Payer: Self-pay | Admitting: Cardiology

## 2018-11-11 DIAGNOSIS — I129 Hypertensive chronic kidney disease with stage 1 through stage 4 chronic kidney disease, or unspecified chronic kidney disease: Secondary | ICD-10-CM

## 2018-11-11 DIAGNOSIS — H04123 Dry eye syndrome of bilateral lacrimal glands: Secondary | ICD-10-CM | POA: Diagnosis not present

## 2018-11-11 DIAGNOSIS — H52203 Unspecified astigmatism, bilateral: Secondary | ICD-10-CM | POA: Diagnosis not present

## 2018-11-11 DIAGNOSIS — Z961 Presence of intraocular lens: Secondary | ICD-10-CM | POA: Diagnosis not present

## 2018-11-11 DIAGNOSIS — H524 Presbyopia: Secondary | ICD-10-CM | POA: Diagnosis not present

## 2018-11-20 DIAGNOSIS — Z23 Encounter for immunization: Secondary | ICD-10-CM | POA: Diagnosis not present

## 2018-12-09 ENCOUNTER — Telehealth: Payer: Self-pay | Admitting: Adult Health

## 2018-12-09 NOTE — Telephone Encounter (Addendum)
The patient is concerned about a return of left-sided facial pain. It varies in severity. The pain is now also radiating behind her left ear and she has tingling around her mouth.  She is using gabapentin 100mg , 5-6 capsules per day, when her pain is at its worst point. She would like to be seen by Dr. Krista Blue. She was placed in a work-in slot for 12/12/2018.

## 2018-12-09 NOTE — Telephone Encounter (Signed)
Patient left a voice message for the phone room.  She reports that she is having horrible pain with her trigeminal neuralgia.  Would like a call back by Dr. Krista Blue or her nurse to discuss.

## 2018-12-10 DIAGNOSIS — L720 Epidermal cyst: Secondary | ICD-10-CM | POA: Diagnosis not present

## 2018-12-10 DIAGNOSIS — D1801 Hemangioma of skin and subcutaneous tissue: Secondary | ICD-10-CM | POA: Diagnosis not present

## 2018-12-10 DIAGNOSIS — L814 Other melanin hyperpigmentation: Secondary | ICD-10-CM | POA: Diagnosis not present

## 2018-12-10 DIAGNOSIS — L821 Other seborrheic keratosis: Secondary | ICD-10-CM | POA: Diagnosis not present

## 2018-12-10 DIAGNOSIS — Z85820 Personal history of malignant melanoma of skin: Secondary | ICD-10-CM | POA: Diagnosis not present

## 2018-12-12 ENCOUNTER — Other Ambulatory Visit: Payer: Self-pay

## 2018-12-12 ENCOUNTER — Ambulatory Visit (INDEPENDENT_AMBULATORY_CARE_PROVIDER_SITE_OTHER): Payer: Medicare Other | Admitting: Neurology

## 2018-12-12 ENCOUNTER — Encounter: Payer: Self-pay | Admitting: Neurology

## 2018-12-12 VITALS — BP 164/65 | HR 65 | Temp 97.3°F | Ht 65.0 in

## 2018-12-12 DIAGNOSIS — M5441 Lumbago with sciatica, right side: Secondary | ICD-10-CM

## 2018-12-12 DIAGNOSIS — G500 Trigeminal neuralgia: Secondary | ICD-10-CM | POA: Diagnosis not present

## 2018-12-12 DIAGNOSIS — G8929 Other chronic pain: Secondary | ICD-10-CM | POA: Diagnosis not present

## 2018-12-12 NOTE — Patient Instructions (Signed)
Electromyoneurogram Electromyoneurogram is a test to check how well your muscles and nerves are working. This procedure includes the combined use of electromyogram (EMG) and nerve conduction study (NCS). EMG is used to look for muscular disorders. NCS, which is also called electroneurogram, measures how well your nerves are controlling your muscles. The procedures are usually done together to check if your muscles and nerves are healthy. If the results of the tests are abnormal, this may indicate disease or injury, such as a neuromuscular disease or peripheral nerve damage. Tell a health care provider about:  Any allergies you have.  All medicines you are taking, including vitamins, herbs, eye drops, creams, and over-the-counter medicines.  Any problems you or family members have had with anesthetic medicines.  Any blood disorders you have.  Any surgeries you have had.  Any medical conditions you have.  If you have a pacemaker.  Whether you are pregnant or may be pregnant. What are the risks? Generally, this is a safe procedure. However, problems may occur, including:  Infection where the electrodes were inserted.  Bleeding. What happens before the procedure? Medicines Ask your health care provider about:  Changing or stopping your regular medicines. This is especially important if you are taking diabetes medicines or blood thinners.  Taking medicines such as aspirin and ibuprofen. These medicines can thin your blood. Do not take these medicines unless your health care provider tells you to take them.  Taking over-the-counter medicines, vitamins, herbs, and supplements. General instructions  Your health care provider may ask you to avoid: ? Beverages that have caffeine, such as coffee and tea. ? Any products that contain nicotine or tobacco. These products include cigarettes, e-cigarettes, and chewing tobacco. If you need help quitting, ask your health care provider.  Do not  use lotions or creams on the same day that you will be having the procedure. What happens during the procedure? For EMG   Your health care provider will ask you to stay in a position so that he or she can access the muscle that will be studied. You may be standing, sitting, or lying down.  You may be given a medicine that numbs the area (local anesthetic).  A very thin needle that has an electrode will be inserted into your muscle.  Another small electrode will be placed on your skin near the muscle.  Your health care provider will ask you to continue to remain still.  The electrodes will send a signal that tells about the electrical activity of your muscles. You may see this on a monitor or hear it in the room.  After your muscles have been studied at rest, your health care provider will ask you to contract or flex your muscles. The electrodes will send a signal that tells about the electrical activity of your muscles.  Your health care provider will remove the electrodes and the electrode needles when the procedure is finished. The procedure may vary among health care providers and hospitals. For NCS   An electrode that records your nerve activity (recording electrode) will be placed on your skin by the muscle that is being studied.  An electrode that is used as a reference (reference electrode) will be placed near the recording electrode.  A paste or gel will be applied to your skin between the recording electrode and the reference electrode.  Your nerve will be stimulated with a mild shock. Your health care provider will measure how much time it takes for your muscle to react.    Your health care provider will remove the electrodes and the gel when the procedure is finished. The procedure may vary among health care providers and hospitals. What happens after the procedure?  It is up to you to get the results of your procedure. Ask your health care provider, or the department  that is doing the procedure, when your results will be ready.  Your health care provider may: ? Give you medicines for any pain. ? Monitor the insertion sites to make sure that bleeding stops. Summary  Electromyoneurogram is a test to check how well your muscles and nerves are working.  If the results of the tests are abnormal, this may indicate disease or injury.  This is a safe procedure. However, problems may occur, such as bleeding and infection.  Your health care provider will do two tests to complete this procedure. One checks your muscles (EMG) and another checks your nerves (NCS).  It is up to you to get the results of your procedure. Ask your health care provider, or the department that is doing the procedure, when your results will be ready. This information is not intended to replace advice given to you by your health care provider. Make sure you discuss any questions you have with your health care provider. Document Released: 06/30/2004 Document Revised: 11/13/2017 Document Reviewed: 10/26/2017 Elsevier Patient Education  2020 [employer]  

## 2018-12-12 NOTE — Progress Notes (Signed)
PATIENT: Cassiopia Daiker DOB: 1950/11/23  Chief Complaint  Patient presents with  . Trigeminal Neuralgia    She is concerned about a return of left-sided facial pain. It varies in severity. The pain is now also radiating behind her left ear and she has tingling around her mouth.  She is using gabapentin 100mg , 5-6 capsules per day, when her pain is at its worst point.     HISTORICAL  Moesha Kuenning is a 68 year old female, seen in request by her primary care physician Dr. Maurine Cane for evaluation of left facial pain, initial evaluation was on April 30, 2018.  I have reviewed and summarized the referring note from the referring physician.  She had past medical history of hypertension, migraine headache, chronic kidney disease, history of cervical decompression in 1999, lumbar spine synovial cyst decompression and fusion in 2007, also had L4-5 lumbar fusion in 2012, she had a residual right foot drop, numbness since second lumbar decompression surgery in 2012, L3-S1 reversal laminectomy and fusion in 2016  Since November 2019, she had recurrent episode of radiating pain at her left face, initially felt like little finger in the light socket, sharp radiating pain from left jaw to left ear, sensitivity involving left upper and lower teeth, multiple episode during the day, she has some gabapentin, which does help her symptoms, but 300 mg of gabapentin make her dizzy, sleepy,  She was seen by dentist initially, was treated with antibiotic, consider possibility of root canal, but there was no dental pathology found, were symptoms last about 2 weeks, now has much improved, no longer has recurrent radiating pain since January 2020,  I personally reviewed MRI of lumbar in March 2019, L3-4 PLIF, adjacent segment degeneration at L2-3 with retrolisthesis, L2-3 mild spinal and foraminal stenosis, L3 to sacrum solid arthrodesis,  Laboratory evaluation showed normal CMP, CBC   UPDATE  Dec 12 2018: Gabapentin has really helped her intermittent left facial pain, she experienced different kind of pain, intermittent shooting pain behind her left ear, the pain can have different severity, she also complains of worsening low back pain, we have personally reviewed MRI of lumbar in June 2020, posterior lumbar fusion decompression of 3 to S1, interbody fusion L3-4, L4-5, there was no significant foraminal or canal stenosis.  There is mild bulging disc above surgical levels.    REVIEW OF SYSTEMS: Full 14 system review of systems performed and notable only for as above. All other review of systems were negative.  ALLERGIES: Allergies  Allergen Reactions  . Epinephrine Palpitations    NASAL SPRAY Cardiac dysrhythmia NASAL SPRAY NASAL SPRAY NASAL SPRAY   . Nsaids Swelling    swelling swelling swelling   . Other Palpitations and Swelling    Glucocorticoids/Steroids Glucocorticoids/Steroids **ANTI INFLAMMATORY DRUGS** Glucocorticoids/Steroids  . Pregabalin Anaphylaxis  . Tolmetin Swelling  . Prednisone Swelling  . Tape Other (See Comments) and Rash    *adhesive* Reaction- blisters  Rips her skin Rips her skin *adhesive* Reaction- blisters  Rips her skin blisters   . Tapentadol Other (See Comments)    *adhesive* Reaction- blisters   . Captopril   . Lisinopril Swelling and Other (See Comments)    Dizziness Dizziness   . Caine-1 [Lidocaine Hcl] Palpitations  . Celecoxib Palpitations    Cardiac dysrhythmia   . Codeine Palpitations    Cardiac dysrhythmia Cardiac dysrhythmia   . Lidocaine Hcl Palpitations  . Procaine Other (See Comments) and Palpitations    dizziness Dizziness dizziness   .  Sulfa Antibiotics Palpitations    Cardiac dysrhythmia   . Sulfasalazine Palpitations    HOME MEDICATIONS: Current Outpatient Medications  Medication Sig Dispense Refill  . amitriptyline (ELAVIL) 10 MG tablet Take 20 mg by mouth at bedtime.     Marland Kitchen aspirin EC 81 MG  tablet Take 1 tablet (81 mg total) by mouth daily.    . B Complex Vitamins (VITAMIN B COMPLEX PO) Take 1 tablet by mouth daily.     . cholecalciferol (VITAMIN D) 1000 units tablet Take 1,000 Units by mouth 2 (two) times daily.     . clonazePAM (KLONOPIN) 1 MG tablet Take 1 tablet (1 mg total) by mouth at bedtime as needed for anxiety. 30 tablet 0  . esomeprazole (NEXIUM) 20 MG capsule Take 40 mg by mouth 2 (two) times daily.     . fluticasone (FLONASE) 50 MCG/ACT nasal spray Place 1 spray into both nostrils daily as needed for allergies.     . furosemide (LASIX) 20 MG tablet Take 1 tablet in the morning on Monday, Wednesday, and Friday. 12 tablet 3  . gabapentin (NEURONTIN) 100 MG capsule Take 100 mg by mouth daily as needed (nerve pain).     . Hyoscyamine Sulfate SL (LEVSIN/SL) 0.125 MG SUBL Place 0.125 mg under the tongue 4 (four) times daily. 40 each 0  . levothyroxine (SYNTHROID, LEVOTHROID) 75 MCG tablet Take 75 mcg by mouth at bedtime.     . meloxicam (MOBIC) 15 MG tablet Take 15 mg by mouth daily as needed for pain.    . metoprolol succinate (TOPROL-XL) 25 MG 24 hr tablet Take 1 tablet (25 mg total) by mouth 2 (two) times daily. 180 tablet 0  . Multiple Vitamins-Minerals (MULTIVITAMIN ADULT PO) Take 1 tablet by mouth daily.     . potassium chloride (K-DUR) 10 MEQ tablet Take 1 tablet in the morning on Monday, Wednesday, and Friday. 12 tablet 3  . sucralfate (CARAFATE) 1 G tablet Take 1 g by mouth daily.    Marland Kitchen tiZANidine (ZANAFLEX) 2 MG tablet Take 2 mg by mouth 2 (two) times daily. Reported on 04/05/2015    . traMADol (ULTRAM) 50 MG tablet Take 1 tablet (50 mg total) by mouth every 6 (six) hours as needed. 8 tablet 0  . TURMERIC PO Take 2,000 mg by mouth daily.    Marland Kitchen zinc gluconate 50 MG tablet Take 50 mg by mouth daily.     No current facility-administered medications for this visit.     PAST MEDICAL HISTORY: Past Medical History:  Diagnosis Date  . Allergy   . Anemia, iron deficiency  01/22/2011   May '12 - 13.8 Hgb, Nov 2, '12 Hgb 10.9   . Arthritis   . 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 CAP (community acquired pneumonia) 10/21/2016  . Chest pain in adult 07/08/2010   March 5, '09 negative cardiolite Sept 28, '10 negative nuclear stress (cardiolite) Dec 21, '11  Negative nuclear stress Brantley Fling) June 26, '14 Negative nuclear stress - [place] with Dr. Bettina Gavia per patient report.  Overview:  CAD risk score is low at 3%  . Chondromalacia patellae 12/02/2012  . Chronic back pain   . Chronic kidney disease   . Chronic knee pain   . Chronic pain 09/22/2010   Multiple sources of pain: radicular pain from lumbar disease and post-operative pain; neck pain from cervical disk disease; left knee pain from trauma.  Plan - will wean off prednisone  Will continue percocet at 1  5/325 norco every 6 hours prn           NSAID - trial of meloxicam 47m g once a day           Increase gabapentin slowly to 600mg  tid and reassess           Executed a pain con  . Chronic venous insufficiency 10/10/2013  . Colitis 10/21/2016  . Collagen vascular disease ([place])   . Deformity of joint 04/23/2013  . Depression with anxiety 09/21/2010  . Derangement of knee, left 2009   started with fall at work. Has had repair patella and torn meniscus  . DJD (degenerative joint disease) of knee 09/02/2012  . Essential hypertension 07/08/2010   Long standing hypertension medically managed. Currently seeing Dr. Daneen Schick and Dr. [facility] ([facility]) for cardiology.  Meds  HCT only  Overview:  Overview:  Long standing hypertension medically managed. Currently seeing Dr. Daneen Schick and Dr. Bettina Gavia ([facility]) for cardiology.  Meds  HCT only  Last Assessment & Plan:  Formatting of this note may be different from the original. BP Readings  . Fever chills 10/21/2016  . Fibromyalgia 02/27/2017  . High cholesterol   . Hx of appendectomy   . Hypertension   . Knee internal derangement 07/08/2010   Left knee:  Feb '09 medial and lateral meniscal tears by MRI                  April '09 arthroscopic surgery   . Melanoma ([place]) 11/19/2015  . Migraine headache    hormonal related - less frequent off hormone replacement  . Migraines   . Neuropathy 04/23/2013  . Peripheral cyanosis 09/28/2012   Full evaluation by Dr. Tobie Lords for rheumatology - no evidence of Raynaud's syndrome or disease; no evidence of underlying rheumatologic disease or malignancy. Most probable cause is neurogenic.   . Pseudoarthrosis of lumbar spine 01/02/2011  . Psoriasis 04/23/2013  . Raynaud disease   . Routine health maintenance 08/17/2011   Colonoscopy - no record in EPIC of colonoscopy  Immunizations - Tdap June '13.    . Salmonella enteritis 10/23/2016  . Sepsis ([place]) 10/23/2016  . Spinal stenosis 08/14/2010   history of cervical laminectomy, lumbar diskectomy with fixation devices and redo surgery.   . Status post lumbar spinal fusion 10/02/2011  . SVT (supraventricular tachycardia) ([place])   . Synovial cyst of lumbar facet joint    lower back - s/p surgery   . Tear of lateral meniscus of left knee 09/30/2012    PAST SURGICAL HISTORY: Past Surgical History:  Procedure Laterality Date  . APPENDECTOMY  1971  . CERVICAL DISCECTOMY  1999   diskectomy with fixation:plate and screws  . COLONOSCOPY    . ESOPHAGOGASTRODUODENOSCOPY  12/2014  . KNEE ARTHROSCOPY W/ MENISCAL REPAIR  09   left knee  . L4-5 lumbar fusion  September 02, 2010   Dr. Hal Neer:  minimally invasive transforaminal interbody fusion with spacer  . OVARIAN CYST SURGERY  1972  . PATELLAR REEFING  '09   repair of fractured patella after fall  . SPINE SURGERY    . SYNOVIAL CYST EXCISION  2007   lumbar spine  . VENOUS ABLATION     for pain in the groin - VVTS did procedure. Has some residual discomfort at the  ablation site.   . WISDOM TOOTH EXTRACTION      FAMILY HISTORY: Family History  Problem Relation Age of Onset  .  Diabetes Mother   . COPD Mother   .  Aortic aneurysm Mother   . Hypertension Mother   . Hypothyroidism Mother   . Cancer Father        lung  . Hyperlipidemia Father   . Hypertension Father   . Heart disease Father   . Diabetes Father   . Heart attack Father   . Diabetes Brother     SOCIAL HISTORY: Social History   Socioeconomic History  . Marital status: Married    Spouse name: Not on file  . Number of children: Not on file  . Years of education: 10  . Highest education level: Not on file  Occupational History  . 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    Employer: [employer]  Social Needs  . Financial resource strain: Not on file  . Food insecurity    Worry: Not on file    Inability: Not on file  . Transportation needs    Medical: Not on file    Non-medical: Not on file  Tobacco Use  . Smoking status: Never Smoker  . Smokeless tobacco: Never Used  Substance and Sexual Activity  . Alcohol use: No  . Drug use: No  . Sexual activity: Yes    Partners: Male    Birth control/protection: None  Lifestyle  . Physical activity    Days per week: Not on file    Minutes per session: Not on file  . Stress: Not on file  Relationships  . Social Herbalist on phone: Not on file    Gets together: Not on file    Attends religious service: Not on file    Active member of club or organization: Not on file    Attends meetings of clubs or organizations: Not on file    Relationship status: Not on file  . Intimate partner violence    Fear of current or ex partner: Not on file    Emotionally abused: Not on file    Physically abused: Not on file    Forced sexual activity: Not on file  Other Topics Concern  . 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 1 dtr- '72; 1 son '73; 4 grandchildren, 2 step-grands. 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   Right handed   Lives at  home with spouse    2-3 cups daily of caffeine      PHYSICAL EXAM   Vitals:   12/12/18 0858  Temp: (!) 97.3 F (36.3 C)  Height: 5\' 5"  (1.651 m)    Not recorded      Body mass index is 39.63 kg/m.  PHYSICAL EXAMNIATION:  Gen: NAD, conversant, well nourised, well groomed                     Cardiovascular: Regular rate rhythm, no peripheral edema, warm, nontender. Eyes: Conjunctivae clear without exudates or hemorrhage Neck: Supple, no carotid bruits. Pulmonary: Clear to auscultation bilaterally   NEUROLOGICAL EXAM:  MENTAL STATUS: Speech:    Speech is normal; fluent and spontaneous with normal comprehension.  Cognition:     Orientation to time, place and person     Normal recent and remote memory     Normal Attention span and concentration     Normal Language, naming, repeating,spontaneous speech     Fund of knowledge   CRANIAL NERVES: CN  II: Visual fields are full to confrontation. Pupils are round equal and briskly reactive to light. CN III, IV, VI: extraocular movement are normal. No ptosis. CN V: Facial sensation is intact to pinprick in all 3 divisions bilaterally. Corneal responses are intact.  CN VII: Face is symmetric with normal eye closure and smile. CN VIII: Hearing is normal to causal conversation. CN IX, X: Palate elevates symmetrically. Phonation is normal. CN XI: Head turning and shoulder shrug are intact CN XII: Tongue is midline with normal movements and no atrophy.  MOTOR:  Muscle bulk and tone are normal. Muscle strength is normal.  REFLEXES: Reflexes are 1 and symmetric at the biceps, triceps, knees, and ankles. Plantar responses are flexor.  SENSORY: Intact to light touch, pinprick, positional sensation and vibratory sensation are intact in fingers and toes.  COORDINATION: Rapid alternating movements and fine finger movements are intact. There is no dysmetria on finger-to-nose and heel-knee-shin.    GAIT/STANCE: She needs push-up to  get up from seated position, cautious, mildly antalgic  DIAGNOSTIC DATA (LABS, IMAGING, TESTING) - I reviewed patient records, labs, notes, testing and imaging myself where available.   ASSESSMENT AND PLAN  Ngina Cotnoir is a 68 y.o. female   Left facial pain  Well controlled by gabapentin, she take it intermittently, due to side effect  Worsening low back pain,  MRI of lumbar showed postsurgical change, per patient, she was offered to have surgery, but there was no significant foraminal or canal stenosis on repeat MRI of the lumbar.  EMG nerve conduction study  Refer her to physical therapy, also suggested heating pad, back stretching exercise  Marcial Pacas, M.D. Ph.D.  [facility] [address], [place],  [address] Ph: [phone] Fax: [phone]  CC: Referring Provider

## 2018-12-31 ENCOUNTER — Ambulatory Visit: Payer: Medicare Other | Admitting: Cardiology

## 2018-12-31 NOTE — Progress Notes (Deleted)
Cardiology Office Note:    Date:  12/31/2018   ID:  [name], [place] 1950-05-22, MRN [id number]  PCP:  Kelton Pillar, MD  Cardiologist:  Shirlee More, MD    Referring MD: Kelton Pillar, MD    ASSESSMENT:    No diagnosis found. PLAN:    In order of problems listed above:  1. ***   Next appointment: ***   Medication Adjustments/Labs and Tests Ordered: Current medicines are reviewed at length with the patient today.  Concerns regarding medicines are outlined above.  No orders of the defined types were placed in this encounter.  No orders of the defined types were placed in this encounter.   No chief complaint on file.   History of Present Illness:    [name] is a 68 y.o. female with a hx of lymphedema palpitations with PAT , HTN, CKD, chronic pain, hypothyroidism, abdominal aorta therosclerosis  last seen 08/24/2018 for non exertional SOB and edema non cardiac etiology.. Compliance with diet, lifestyle and medications: ***   Ref Range & Units 31mo ago 68yr ago  NT-Pro BNP 0 - 301 pg/mL 145  119    CXR 10/24/2018: IMPRESSION: Normal chest radiographs.  Echo 10/25/2018:  1. The left ventricle has normal systolic function with an ejection fraction of 60-65%. The cavity size was normal. There is moderately increased left ventricular wall thickness. Left ventricular diastolic Doppler parameters are consistent with impaired  relaxation.  2. The right ventricle has normal systolic function. The cavity was normal. There is no increase in right ventricular wall thickness. Past Medical History:  Diagnosis Date  . Allergy   . Anemia, iron deficiency 01/22/2011   May '12 - 13.8 Hgb, Nov 2, '12 Hgb 10.9   . Arthritis   . 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 CAP (community acquired pneumonia) 10/21/2016  . Chest pain in adult 07/08/2010   March 5, '09 negative cardiolite Sept 28, '10 negative nuclear stress (cardiolite) Dec 21, '11  Negative  nuclear stress Brantley Fling) June 26, '14 Negative nuclear stress - [place] with Dr. Bettina Gavia per patient report.  Overview:  CAD risk score is low at 3%  . Chondromalacia patellae 12/02/2012  . Chronic back pain   . Chronic kidney disease   . Chronic knee pain   . Chronic pain 09/22/2010   Multiple sources of pain: radicular pain from lumbar disease and post-operative pain; neck pain from cervical disk disease; left knee pain from trauma.  Plan - will wean off prednisone           Will continue percocet at 1  5/325 norco every 6 hours prn           NSAID - trial of meloxicam 38m g once a day           Increase gabapentin slowly to 600mg  tid and reassess           Executed a pain con  . Chronic venous insufficiency 10/10/2013  . Colitis 10/21/2016  . Collagen vascular disease ([place])   . Deformity of joint 04/23/2013  . Depression with anxiety 09/21/2010  . Derangement of knee, left 2009   started with fall at work. Has had repair patella and torn meniscus  . DJD (degenerative joint disease) of knee 09/02/2012  . Essential hypertension 07/08/2010   Long standing hypertension medically managed. Currently seeing Dr. Daneen Schick and Dr. Marin [facility] ([facility]) for cardiology.  Meds  HCT only  Overview:  Overview:  Long standing hypertension medically managed.  Currently seeing Dr. Daneen Schick and Dr. Bettina Gavia ([facility]) for cardiology.  Meds  HCT only  Last Assessment & Plan:  Formatting of this note may be different from the original. BP Readings  . Fever chills 10/21/2016  . Fibromyalgia 02/27/2017  . High cholesterol   . Hx of appendectomy   . Hypertension   . Knee internal derangement 07/08/2010   Left knee: Feb '09 medial and lateral meniscal tears by MRI                  April '09 arthroscopic surgery   . Melanoma ([place]) 11/19/2015  . Migraine headache    hormonal related - less frequent off hormone replacement  . Migraines   . Neuropathy 04/23/2013  . Peripheral cyanosis 09/28/2012   Full evaluation by Dr.  Tobie Lords for rheumatology - no evidence of Raynaud's syndrome or disease; no evidence of underlying rheumatologic disease or malignancy. Most probable cause is neurogenic.   . Pseudoarthrosis of lumbar spine 01/02/2011  . Psoriasis 04/23/2013  . Raynaud disease   . Routine health maintenance 08/17/2011   Colonoscopy - no record in EPIC of colonoscopy  Immunizations - Tdap June '13.    . Salmonella enteritis 10/23/2016  . Sepsis ([place]) 10/23/2016  . Spinal stenosis 08/14/2010   history of cervical laminectomy, lumbar diskectomy with fixation devices and redo surgery.   . Status post lumbar spinal fusion 10/02/2011  . SVT (supraventricular tachycardia) ([place])   . Synovial cyst of lumbar facet joint    lower back - s/p surgery   . Tear of lateral meniscus of left knee 09/30/2012    Past Surgical History:  Procedure Laterality Date  . APPENDECTOMY  1971  . CERVICAL DISCECTOMY  1999   diskectomy with fixation:plate and screws  . COLONOSCOPY    . ESOPHAGOGASTRODUODENOSCOPY  12/2014  . KNEE ARTHROSCOPY W/ MENISCAL REPAIR  09   left knee  . L4-5 lumbar fusion  September 02, 2010   Dr. Hal Neer:  minimally invasive transforaminal interbody fusion with spacer  . OVARIAN CYST SURGERY  1972  . PATELLAR REEFING  '09   repair of fractured patella after fall  . SPINE SURGERY    . SYNOVIAL CYST EXCISION  2007   lumbar spine  . VENOUS ABLATION     for pain in the groin - VVTS did procedure. Has some residual discomfort at the  ablation site.   . WISDOM TOOTH EXTRACTION      Current Medications: No outpatient medications have been marked as taking for the 12/31/18 encounter (Appointment) with Richardo Priest, MD.     Allergies:   Epinephrine, Nsaids, Other, Pregabalin, Tolmetin, Prednisone, Tape, Tapentadol, Captopril, Lisinopril, Caine-1 [lidocaine hcl], Celecoxib, Codeine, Lidocaine hcl, Procaine, Sulfa antibiotics, and Sulfasalazine   Social History   Socioeconomic History  . Marital status:  Married    Spouse name: Not on file  . Number of children: Not on file  . Years of education: 36  . Highest education level: Not on file  Occupational History  . 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    Employer: [employer]  Social Needs  . Financial resource strain: Not on file  . Food insecurity    Worry: Not on file    Inability: Not on file  . Transportation needs    Medical: Not on file    Non-medical: Not on file  Tobacco Use  . Smoking status: Never Smoker  . Smokeless tobacco: Never Used  Substance and Sexual Activity  . Alcohol  use: No  . Drug use: No  . Sexual activity: Yes    Partners: Male    Birth control/protection: None  Lifestyle  . Physical activity    Days per week: Not on file    Minutes per session: Not on file  . Stress: Not on file  Relationships  . Social Herbalist on phone: Not on file    Gets together: Not on file    Attends religious service: Not on file    Active member of club or organization: Not on file    Attends meetings of clubs or organizations: Not on file    Relationship status: Not on file  Other Topics Concern  . 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 1 dtr- '72; 1 son '73; 4 grandchildren, 2 step-grands. 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   Right handed   Lives at home with spouse    2-3 cups daily of caffeine      Family History: The patient's ***family history includes Aortic aneurysm in her mother; COPD in her mother; Cancer in her father; Diabetes in her brother, father, and mother; Heart attack in her father; Heart disease in her father; Hyperlipidemia in her father; Hypertension in her father and mother; Hypothyroidism in her mother. ROS:   Please see the history of present illness.    All other systems reviewed and are negative.  EKGs/Labs/Other Studies Reviewed:     The following studies were reviewed today:  EKG:  EKG ordered today and personally reviewed.  The ekg ordered today demonstrates ***  Recent Labs: 05/23/2018: Hemoglobin 14.3; Platelets 233 05/24/2018: ALT 42 10/24/2018: BUN 16; Creatinine, Ser 0.98; NT-Pro BNP 145; Potassium 4.5; Sodium 141  Recent Lipid Panel    Component Value Date/Time   CHOL 189 12/07/2011 1252   TRIG 127.0 12/07/2011 1252   HDL 49.30 12/07/2011 1252   CHOLHDL 4 12/07/2011 1252   VLDL 25.4 12/07/2011 1252   LDLCALC 114 (H) 12/07/2011 1252    Physical Exam:    VS:  There were no vitals taken for this visit.    Wt Readings from Last 3 Encounters:  10/24/18 238 lb 1.9 oz (108 kg)  05/23/18 222 lb (100.7 kg)  04/30/18 232 lb (105.2 kg)     GEN: *** Well nourished, well developed in no acute distress HEENT: Normal NECK: No JVD; No carotid bruits LYMPHATICS: No lymphadenopathy CARDIAC: ***RRR, no murmurs, rubs, gallops RESPIRATORY:  Clear to auscultation without rales, wheezing or rhonchi  ABDOMEN: Soft, non-tender, non-distended MUSCULOSKELETAL:  No edema; No deformity  SKIN: Warm and dry NEUROLOGIC:  Alert and oriented x 3 PSYCHIATRIC:  Normal affect    Signed, Shirlee More, MD  12/31/2018 7:51 AM    [place]

## 2019-01-07 ENCOUNTER — Ambulatory Visit: Payer: Medicare Other | Admitting: Physical Therapy

## 2019-01-21 DIAGNOSIS — N182 Chronic kidney disease, stage 2 (mild): Secondary | ICD-10-CM | POA: Diagnosis not present

## 2019-01-21 DIAGNOSIS — F419 Anxiety disorder, unspecified: Secondary | ICD-10-CM | POA: Diagnosis not present

## 2019-01-21 DIAGNOSIS — J45909 Unspecified asthma, uncomplicated: Secondary | ICD-10-CM | POA: Diagnosis not present

## 2019-01-21 DIAGNOSIS — M797 Fibromyalgia: Secondary | ICD-10-CM | POA: Diagnosis not present

## 2019-01-21 DIAGNOSIS — I129 Hypertensive chronic kidney disease with stage 1 through stage 4 chronic kidney disease, or unspecified chronic kidney disease: Secondary | ICD-10-CM | POA: Diagnosis not present

## 2019-01-21 DIAGNOSIS — Z0000 Encounter for general adult medical examination without abnormal findings: Secondary | ICD-10-CM | POA: Diagnosis not present

## 2019-01-21 DIAGNOSIS — Z1389 Encounter for screening for other disorder: Secondary | ICD-10-CM | POA: Diagnosis not present

## 2019-01-21 DIAGNOSIS — K760 Fatty (change of) liver, not elsewhere classified: Secondary | ICD-10-CM | POA: Diagnosis not present

## 2019-01-21 DIAGNOSIS — K589 Irritable bowel syndrome without diarrhea: Secondary | ICD-10-CM | POA: Diagnosis not present

## 2019-01-21 DIAGNOSIS — M810 Age-related osteoporosis without current pathological fracture: Secondary | ICD-10-CM | POA: Diagnosis not present

## 2019-01-21 DIAGNOSIS — K219 Gastro-esophageal reflux disease without esophagitis: Secondary | ICD-10-CM | POA: Diagnosis not present

## 2019-01-21 DIAGNOSIS — E039 Hypothyroidism, unspecified: Secondary | ICD-10-CM | POA: Diagnosis not present

## 2019-01-29 ENCOUNTER — Other Ambulatory Visit: Payer: Self-pay

## 2019-01-29 ENCOUNTER — Ambulatory Visit (INDEPENDENT_AMBULATORY_CARE_PROVIDER_SITE_OTHER): Payer: Medicare Other | Admitting: Neurology

## 2019-01-29 DIAGNOSIS — G8929 Other chronic pain: Secondary | ICD-10-CM

## 2019-01-29 DIAGNOSIS — R252 Cramp and spasm: Secondary | ICD-10-CM

## 2019-01-29 DIAGNOSIS — R202 Paresthesia of skin: Secondary | ICD-10-CM | POA: Diagnosis not present

## 2019-01-29 DIAGNOSIS — G500 Trigeminal neuralgia: Secondary | ICD-10-CM

## 2019-01-29 DIAGNOSIS — Z0289 Encounter for other administrative examinations: Secondary | ICD-10-CM

## 2019-01-29 NOTE — Procedures (Signed)
Full Name: [name] Gender: Female MRN #: [id number] Date of Birth: April 16, 1950    Visit Date: 01/29/2019 10:45 Age: 68 Years 75 Months Old Examining Physician: Marcial Pacas, MD  Referring Physician: Marcial Pacas, MD History:   68 years old female, presented with bilateral feet paresthesia and frequent muscle spasm.  Summary of the tests: Nerve conduction study: Left sural, bilateral superficial peroneal sensory responses showed mildly decreased snap amplitude.  Right sural sensory response was normal. Bilateral tibial motor responses showed no response at proximal stimulation side, likely due to suboptimal stimulations.  Right peroneal motor responses were absent.  Left peroneal to EDB motor response was normal.  Tibial H reflex was present.  Right tibial H reflex was absent.  Electromyography: Selected needle examinations were performed at bilateral lower extremity muscles and bilateral lumbosacral paraspinal muscles.  There was no significant abnormality identified.   Conclusion: This is an abnormal study.  There is electrodiagnostic evidence of slight length dependent axonal sensorimotor polyneuropathy.  There is no evidence of bilateral lumbosacral radiculopathy.    ------------------------------- Marcial Pacas, M.D. PhD  [facility] [place], [place] [address] Tel: [phone] Fax: [phone]        [facility]    Nerve / Sites Muscle Latency Ref. Amplitude Ref. Rel Amp Segments Distance Velocity Ref. Area    ms ms mV mV %  cm m/s m/s mVms  L Peroneal - EDB     Ankle EDB 3.9 ?6.5 2.0 ?2.0 100 Ankle - EDB 9   13.3     Fib head EDB 10.3  1.7  86 Fib head - Ankle 28 44 ?44 11.5     Pop fossa EDB 12.3  1.5  90.5 Pop fossa - Fib head 10 48 ?44 11.0         Pop fossa - Ankle      R Peroneal - EDB     Ankle EDB 5.7 ?6.5 0.4 ?2.0 100 Ankle - EDB 9   2.6     Fib head EDB 14.5  0.3  65.3 Fib head - Ankle 28 32 ?44 1.0     Pop fossa EDB NR   NR  NR Pop fossa - Fib head 10 NR ?44 NR         Pop fossa - Ankle      L Tibial - AH     Ankle AH 4.2 ?5.8 5.1 ?4.0 100 Ankle - AH 9   17.3     Pop fossa AH NR  NR  NR Pop fossa - Ankle 36 NR ?41 NR  R Tibial - AH     Ankle AH 3.9 ?5.8 4.4 ?4.0 100 Ankle - AH 9   10.5     Pop fossa AH NR  NR  NR Pop fossa - Ankle 36 NR ?41 NR             SNC    Nerve / Sites Rec. Site Peak Lat Ref.  Amp Ref. Segments Distance    ms ms V V  cm  L Sural - Ankle (Calf)     Calf Ankle 4.1 ?4.4 2 ?6 Calf - Ankle 14  R Sural - Ankle (Calf)     Calf Ankle 3.2 ?4.4 9 ?6 Calf - Ankle 14  L Superficial peroneal - Ankle     Lat leg Ankle 3.9 ?4.4 4 ?6 Lat leg - Ankle 14  R Superficial peroneal - Ankle  Lat leg Ankle 3.6 ?4.4 2 ?6 Lat leg - Ankle 14              F  Wave    Nerve F Lat Ref.   ms ms  L Tibial - AH 57.7 ?56.0  R Tibial - AH 57.9 ?56.0         H Reflex    Nerve H Lat   ms   Left Right Ref.  Tibial - [place] 35.3 NR ?35.0         EMG       EMG Summary Table    Spontaneous MUAP Recruitment  Muscle IA Fib PSW Fasc Other Amp Dur. Poly Pattern  L. Tibialis anterior Normal None None None _______ Normal Normal Normal Normal  L. Tibialis posterior Normal None None None _______ Normal Normal Normal Normal  L. Peroneus longus Normal None None None _______ Normal Normal Normal Normal  L. Gastrocnemius (Medial head) Normal None None None _______ Normal Normal Normal Normal  L. Vastus lateralis Normal None None None _______ Normal Normal Normal Normal  R. Tibialis anterior Normal None None None _______ Normal Normal Normal Normal  R. Tibialis posterior Normal None None None _______ Normal Normal Normal Normal  R. Peroneus longus Normal None None None _______ Normal Normal Normal Normal  R. Gastrocnemius (Medial head) Normal None None None _______ Normal Normal Normal Normal  R. Vastus lateralis Normal None None None _______ Normal Normal Normal Normal  R. Lumbar paraspinals (mid) Normal  None None None _______ Normal Normal Normal Normal  R. Lumbar paraspinals (low) Normal None None None _______ Normal Normal Normal Normal  L. Lumbar paraspinals (low) Normal None None None _______ Normal Normal Normal Normal  L. Lumbar paraspinals (mid) Normal None None None _______ Normal Normal Normal Normal

## 2019-02-03 ENCOUNTER — Ambulatory Visit: Payer: Medicare Other | Admitting: Cardiology

## 2019-02-03 NOTE — Progress Notes (Deleted)
Cardiology Office Note:    Date:  02/03/2019   ID:  [name], DOB 03/20/1950, MRN [id number]  PCP:  Kelton Pillar, MD  Cardiologist:  Shirlee More, MD    Referring MD: Kelton Pillar, MD    ASSESSMENT:    No diagnosis found. PLAN:    In order of problems listed above:  1. ***   Next appointment: ***   Medication Adjustments/Labs and Tests Ordered: Current medicines are reviewed at length with the patient today.  Concerns regarding medicines are outlined above.  No orders of the defined types were placed in this encounter.  No orders of the defined types were placed in this encounter.   No chief complaint on file.   History of Present Illness:    [name] is a 68 y.o. female with a hx of palpitations, HTN, CKD, chronic pain, hypothyroidism, abdominal aorta therosclerosis last seen 02/18/2018. Echo 12/25/2017 was normal. An extended 14-day ZIO monitor was performed which showed occasional atrial arrthmia, 4.5% frequent APCs, and brief runs of APCs consistent with atrial tachycardia and there were 8 symptomatic events of atrial arrhythmia.  She was last seen 10/24/2018 for SOB. Compliance with diet, lifestyle and medications: ***  Ref Range & Units 82mo ago 74yr ago   NT-Pro BNP 0 - 301 pg/mL 145  119    Echo 10/25/2018:  1. The left ventricle has normal systolic function with an ejection fraction of 60-65%. The cavity size was normal. There is moderately increased left ventricular wall thickness. Left ventricular diastolic Doppler parameters are consistent with impaired relaxation.  2. The right ventricle has normal systolic function. The cavity was normal. There is no increase in right ventricular wall thickness.  LEFT VENTRICLE         +----------------+----------++ +--------------+--------++ Diastology                 PLAX 2D                +----------------+----------++ +--------------+--------++ LV e' lateral:  12.60 cm/s  LVIDd:        3.96 cm  +----------------+----------++ +--------------+--------++ LV E/e' lateral:6.0        LVIDs:        2.29 cm  +----------------+----------++ +--------------+--------++ LV e' medial:   7.51 cm/s  LV PW:        1.21 cm  +----------------+----------++ +--------------+--------++ LV E/e' medial: 10.1        Past Medical History:  Diagnosis Date  . Allergy   . Anemia, iron deficiency 01/22/2011   May '12 - 13.8 Hgb, Nov 2, '12 Hgb 10.9   . Arthritis   . 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 CAP (community acquired pneumonia) 10/21/2016  . Chest pain in adult 07/08/2010   March 5, '09 negative cardiolite Sept 28, '10 negative nuclear stress (cardiolite) Dec 21, '11  Negative nuclear stress [facility] Fling) June 26, '14 Negative nuclear stress - [place] with Dr. Bettina Gavia per patient report.  Overview:  CAD risk score is low at 3%  . Chondromalacia patellae 12/02/2012  . Chronic back pain   . Chronic kidney disease   . Chronic knee pain   . Chronic pain 09/22/2010   Multiple sources of pain: radicular pain from lumbar disease and post-operative pain; neck pain from cervical disk disease; left knee pain from trauma.  Plan - will wean off prednisone           Will continue percocet at 1  5/325 norco every 6 hours prn  NSAID - trial of meloxicam 38m g once a day           Increase gabapentin slowly to 600mg  tid and reassess           Executed a pain con  . Chronic venous insufficiency 10/10/2013  . Colitis 10/21/2016  . Collagen vascular disease ([place])   . Deformity of joint 04/23/2013  . Depression with anxiety 09/21/2010  . Derangement of knee, left 2009   started with fall at work. Has had repair patella and torn meniscus  . DJD (degenerative joint disease) of knee 09/02/2012  . Essential hypertension 07/08/2010   Long standing hypertension medically managed. Currently seeing Dr. Daneen Schick and Dr. Marin Roberts ([facility]) for cardiology.  Meds  HCT  only  Overview:  Overview:  Long standing hypertension medically managed. Currently seeing Dr. Daneen Schick and Dr. Bettina Gavia ([facility]) for cardiology.  Meds  HCT only  Last Assessment & Plan:  Formatting of this note may be different from the original. BP Readings  . Fever chills 10/21/2016  . Fibromyalgia 02/27/2017  . High cholesterol   . Hx of appendectomy   . Hypertension   . Knee internal derangement 07/08/2010   Left knee: Feb '09 medial and lateral meniscal tears by MRI                  April '09 arthroscopic surgery   . Melanoma ([place]) 11/19/2015  . Migraine headache    hormonal related - less frequent off hormone replacement  . Migraines   . Neuropathy 04/23/2013  . Peripheral cyanosis 09/28/2012   Full evaluation by Dr. Tobie Lords for rheumatology - no evidence of Raynaud's syndrome or disease; no evidence of underlying rheumatologic disease or malignancy. Most probable cause is neurogenic.   . Pseudoarthrosis of lumbar spine 01/02/2011  . Psoriasis 04/23/2013  . Raynaud disease   . Routine health maintenance 08/17/2011   Colonoscopy - no record in EPIC of colonoscopy  Immunizations - Tdap June '13.    . Salmonella enteritis 10/23/2016  . Sepsis ([place]) 10/23/2016  . Spinal stenosis 08/14/2010   history of cervical laminectomy, lumbar diskectomy with fixation devices and redo surgery.   . Status post lumbar spinal fusion 10/02/2011  . SVT (supraventricular tachycardia) ([place])   . Synovial cyst of lumbar facet joint    lower back - s/p surgery   . Tear of lateral meniscus of left knee 09/30/2012    Past Surgical History:  Procedure Laterality Date  . APPENDECTOMY  1971  . CERVICAL DISCECTOMY  1999   diskectomy with fixation:plate and screws  . COLONOSCOPY    . ESOPHAGOGASTRODUODENOSCOPY  12/2014  . KNEE ARTHROSCOPY W/ MENISCAL REPAIR  09   left knee  . L4-5 lumbar fusion  September 02, 2010   Dr. Hal Neer:  minimally invasive transforaminal interbody fusion with spacer  . OVARIAN CYST  SURGERY  1972  . PATELLAR REEFING  '09   repair of fractured patella after fall  . SPINE SURGERY    . SYNOVIAL CYST EXCISION  2007   lumbar spine  . VENOUS ABLATION     for pain in the groin - VVTS did procedure. Has some residual discomfort at the  ablation site.   . WISDOM TOOTH EXTRACTION      Current Medications: No outpatient medications have been marked as taking for the 02/03/19 encounter (Appointment) with Richardo Priest, MD.     Allergies:   Epinephrine, Nsaids, Other, Pregabalin, Tolmetin, Prednisone, Tape, Tapentadol, Captopril,  Lisinopril, Caine-1 [lidocaine hcl], Celecoxib, Codeine, Lidocaine hcl, Procaine, Sulfa antibiotics, and Sulfasalazine   Social History   Socioeconomic History  . Marital status: Married    Spouse name: Not on file  . Number of children: Not on file  . Years of education: 76  . Highest education level: Not on file  Occupational History  . 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    Employer: [employer]  Social Needs  . Financial resource strain: Not on file  . Food insecurity    Worry: Not on file    Inability: Not on file  . Transportation needs    Medical: Not on file    Non-medical: Not on file  Tobacco Use  . Smoking status: Never Smoker  . Smokeless tobacco: Never Used  Substance and Sexual Activity  . Alcohol use: No  . Drug use: No  . Sexual activity: Yes    Partners: Male    Birth control/protection: None  Lifestyle  . Physical activity    Days per week: Not on file    Minutes per session: Not on file  . Stress: Not on file  Relationships  . Social Herbalist on phone: Not on file    Gets together: Not on file    Attends religious service: Not on file    Active member of club or organization: Not on file    Attends meetings of clubs or organizations: Not on file    Relationship status: Not on file  Other Topics Concern  . 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 Married '71-  '10/widowed; engaged. 1 dtr- '72; 1 son '73; 4 grandchildren, 2 step-grands. 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   Right handed   Lives at home with spouse    2-3 cups daily of caffeine      Family History: The patient's ***family history includes Aortic aneurysm in her mother; COPD in her mother; Cancer in her father; Diabetes in her brother, father, and mother; Heart attack in her father; Heart disease in her father; Hyperlipidemia in her father; Hypertension in her father and mother; Hypothyroidism in her mother. ROS:   Please see the history of present illness.    All other systems reviewed and are negative.  EKGs/Labs/Other Studies Reviewed:    The following studies were reviewed today:  EKG:  EKG ordered today and personally reviewed.  The ekg ordered today demonstrates ***  Recent Labs: 05/23/2018: Hemoglobin 14.3; Platelets 233 05/24/2018: ALT 42 10/24/2018: BUN 16; Creatinine, Ser 0.98; NT-Pro BNP 145; Potassium 4.5; Sodium 141  Recent Lipid Panel    Component Value Date/Time   CHOL 189 12/07/2011 1252   TRIG 127.0 12/07/2011 1252   HDL 49.30 12/07/2011 1252   CHOLHDL 4 12/07/2011 1252   VLDL 25.4 12/07/2011 1252   LDLCALC 114 (H) 12/07/2011 1252    Physical Exam:    VS:  There were no vitals taken for this visit.    Wt Readings from Last 3 Encounters:  10/24/18 238 lb 1.9 oz (108 kg)  05/23/18 222 lb (100.7 kg)  04/30/18 232 lb (105.2 kg)     GEN: *** Well nourished, well developed in no acute distress HEENT: Normal NECK: No JVD; No carotid bruits LYMPHATICS: No lymphadenopathy CARDIAC: ***RRR, no murmurs, rubs, gallops RESPIRATORY:  Clear to auscultation without rales, wheezing or rhonchi  ABDOMEN: Soft, non-tender, non-distended MUSCULOSKELETAL:  No  edema; No deformity  SKIN: Warm and dry NEUROLOGIC:  Alert and oriented x 3 PSYCHIATRIC:  Normal affect    Signed,  Shirlee More, MD  02/03/2019 7:52 AM    [place]

## 2019-02-04 ENCOUNTER — Ambulatory Visit: Payer: Medicare Other | Admitting: Cardiology

## 2019-02-10 DIAGNOSIS — R29890 Loss of height: Secondary | ICD-10-CM | POA: Diagnosis not present

## 2019-02-10 DIAGNOSIS — Z8262 Family history of osteoporosis: Secondary | ICD-10-CM | POA: Diagnosis not present

## 2019-02-10 DIAGNOSIS — M85852 Other specified disorders of bone density and structure, left thigh: Secondary | ICD-10-CM | POA: Diagnosis not present

## 2019-02-10 DIAGNOSIS — Z780 Asymptomatic menopausal state: Secondary | ICD-10-CM | POA: Diagnosis not present

## 2019-02-12 ENCOUNTER — Other Ambulatory Visit: Payer: Self-pay | Admitting: Cardiology

## 2019-02-12 DIAGNOSIS — I129 Hypertensive chronic kidney disease with stage 1 through stage 4 chronic kidney disease, or unspecified chronic kidney disease: Secondary | ICD-10-CM

## 2019-02-23 DIAGNOSIS — B349 Viral infection, unspecified: Secondary | ICD-10-CM | POA: Diagnosis not present

## 2019-03-11 ENCOUNTER — Other Ambulatory Visit: Payer: Self-pay

## 2019-03-11 ENCOUNTER — Other Ambulatory Visit (HOSPITAL_BASED_OUTPATIENT_CLINIC_OR_DEPARTMENT_OTHER): Payer: Self-pay | Admitting: Family Medicine

## 2019-03-11 ENCOUNTER — Ambulatory Visit (HOSPITAL_BASED_OUTPATIENT_CLINIC_OR_DEPARTMENT_OTHER)
Admission: RE | Admit: 2019-03-11 | Discharge: 2019-03-11 | Disposition: A | Discharge status: Home / Self Care | Payer: Medicare Other | Source: Ambulatory Visit | Attending: Family Medicine | Admitting: Family Medicine

## 2019-03-11 DIAGNOSIS — M79605 Pain in left leg: Secondary | ICD-10-CM | POA: Diagnosis not present

## 2019-03-11 DIAGNOSIS — R52 Pain, unspecified: Secondary | ICD-10-CM | POA: Insufficient documentation

## 2019-03-18 DIAGNOSIS — I890 Lymphedema, not elsewhere classified: Secondary | ICD-10-CM | POA: Diagnosis not present

## 2019-03-18 DIAGNOSIS — M79671 Pain in right foot: Secondary | ICD-10-CM | POA: Diagnosis not present

## 2019-03-18 DIAGNOSIS — M79605 Pain in left leg: Secondary | ICD-10-CM | POA: Diagnosis not present

## 2019-03-18 DIAGNOSIS — R600 Localized edema: Secondary | ICD-10-CM | POA: Diagnosis not present

## 2019-03-20 ENCOUNTER — Other Ambulatory Visit: Payer: Self-pay

## 2019-03-20 ENCOUNTER — Encounter: Payer: Self-pay | Admitting: Cardiology

## 2019-03-20 ENCOUNTER — Ambulatory Visit (INDEPENDENT_AMBULATORY_CARE_PROVIDER_SITE_OTHER): Payer: Medicare Other | Admitting: Cardiology

## 2019-03-20 VITALS — BP 128/70 | HR 66 | Ht 64.0 in | Wt 236.0 lb

## 2019-03-20 DIAGNOSIS — N181 Chronic kidney disease, stage 1: Secondary | ICD-10-CM | POA: Diagnosis not present

## 2019-03-20 DIAGNOSIS — I890 Lymphedema, not elsewhere classified: Secondary | ICD-10-CM | POA: Diagnosis not present

## 2019-03-20 DIAGNOSIS — I129 Hypertensive chronic kidney disease with stage 1 through stage 4 chronic kidney disease, or unspecified chronic kidney disease: Secondary | ICD-10-CM

## 2019-03-20 DIAGNOSIS — I471 Supraventricular tachycardia: Secondary | ICD-10-CM | POA: Diagnosis not present

## 2019-03-20 NOTE — Progress Notes (Signed)
Cardiology Office Note:    Date:  03/20/2019   ID:  [name], DOB 1950-12-06, MRN [id number]  PCP:  Kelton Pillar, MD  Cardiologist:  Shirlee More, MD    Referring MD: Kelton Pillar, MD    ASSESSMENT:    1. PAT (paroxysmal atrial tachycardia) ([place])   2. Hypertensive kidney disease with stage 1 chronic kidney disease   3. Lymphedema of both lower extremities    PLAN:    In order of problems listed above:  1. Stable continue beta-blocker she takes as needed 2. Stable continue treatment including ARB with CKD 3. Improved managed by vascular surgery continue compressive home device   Next appointment: 1 year   Medication Adjustments/Labs and Tests Ordered: Current medicines are reviewed at length with the patient today.  Concerns regarding medicines are outlined above.  No orders of the defined types were placed in this encounter.  No orders of the defined types were placed in this encounter.   Chief Complaint  Patient presents with  . Palpitations    With APCs and brief atrial tachycardia    History of Present Illness:    [name] is a 69 y.o. female with a hx of palpitations, HTN, CKD, chronic pain, hypothyroidism, abdominal aorta therosclerosis  last seen 10/24/2018.Echo 12/25/2017 was normal. An extended 14-day ZIO monitor was performed which showed occasional atrial arrthmia, 4.5% frequent APCs, and brief runs of APCs consistent with atrial tachycardia and there were 8 symptomatic events of atrial arrhythmia she also has a history of lymphedema followed by vascular surgery and [facility]  Compliance with diet, lifestyle and medications: Yes  Echo 10/25/2018:  1. The left ventricle has normal systolic function with an ejection fraction of 60-65%. The cavity size was normal. There is moderately increased left ventricular wall thickness. Left ventricular diastolic Doppler parameters are consistent with impaired  relaxation.  2. The  right ventricle has normal systolic function. The cavity was normal. There is no increase in right ventricular wall thickness.  3. The aorta is normal unless otherwise noted.   Ref Range & Units 4 mo ago 2 yr ago  NT-Pro BNP 0 - 301 pg/mL 145  119 CM    Venous US LLE 03/11/2019: IMPRESSION: No evidence of deep venous thrombosis.  CXR 10/24/2018: IMPRESSION: Normal chest radiographs.  In the interim she has developed neuropathy and is taking higher dose of gabapentin.  I warned her that it can favor edema and sodium retention.  She is not having frequent palpitation takes her beta-blocker as needed.  Hypertension is controlled has had no chest pain edema shortness of breath or syncope Past Medical History:  Diagnosis Date  . Allergy   . Anemia, iron deficiency 01/22/2011   May '12 - 13.8 Hgb, Nov 2, '12 Hgb 10.9   . Arthritis   . 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 CAP (community acquired pneumonia) 10/21/2016  . Chest pain in adult 07/08/2010   March 5, '09 negative cardiolite Sept 28, '10 negative nuclear stress (cardiolite) Dec 21, '11  Negative nuclear stress Brantley Fling) June 26, '14 Negative nuclear stress - [place] with Dr. Bettina Gavia per patient report.  Overview:  CAD risk score is low at 3%  . Chondromalacia patellae 12/02/2012  . Chronic back pain   . Chronic kidney disease   . Chronic knee pain   . Chronic pain 09/22/2010   Multiple sources of pain: radicular pain from lumbar disease and post-operative pain; neck pain from cervical disk disease; left knee  pain from trauma.  Plan - will wean off prednisone           Will continue percocet at 1  5/325 norco every 6 hours prn           NSAID - trial of meloxicam 9m g once a day           Increase gabapentin slowly to 600mg  tid and reassess           Executed a pain con  . Chronic venous insufficiency 10/10/2013  . Colitis 10/21/2016  . Collagen vascular disease ([place])   . Deformity of joint 04/23/2013  . Depression with  anxiety 09/21/2010  . Derangement of knee, left 2009   started with fall at work. Has had repair patella and torn meniscus  . DJD (degenerative joint disease) of knee 09/02/2012  . Essential hypertension 07/08/2010   Long standing hypertension medically managed. Currently seeing Dr. Daneen Schick and Dr. Marin [facility] ([facility]) for cardiology.  Meds  HCT only  Overview:  Overview:  Long standing hypertension medically managed. Currently seeing Dr. Daneen Schick and Dr. Bettina Gavia ([facility]) for cardiology.  Meds  HCT only  Last Assessment & Plan:  Formatting of this note may be different from the original. BP Readings  . Fever chills 10/21/2016  . Fibromyalgia 02/27/2017  . High cholesterol   . Hx of appendectomy   . Hypertension   . Knee internal derangement 07/08/2010   Left knee: Feb '09 medial and lateral meniscal tears by MRI                  April '09 arthroscopic surgery   . Melanoma ([place]) 11/19/2015  . Migraine headache    hormonal related - less frequent off hormone replacement  . Migraines   . Neuropathy 04/23/2013  . Peripheral cyanosis 09/28/2012   Full evaluation by Dr. Tobie Lords for rheumatology - no evidence of Raynaud's syndrome or disease; no evidence of underlying rheumatologic disease or malignancy. Most probable cause is neurogenic.   . Pseudoarthrosis of lumbar spine 01/02/2011  . Psoriasis 04/23/2013  . Raynaud disease   . Routine health maintenance 08/17/2011   Colonoscopy - no record in EPIC of colonoscopy  Immunizations - Tdap June '13.    . Salmonella enteritis 10/23/2016  . Sepsis ([place]) 10/23/2016  . Spinal stenosis 08/14/2010   history of cervical laminectomy, lumbar diskectomy with fixation devices and redo surgery.   . Status post lumbar spinal fusion 10/02/2011  . SVT (supraventricular tachycardia) ([place])   . Synovial cyst of lumbar facet joint    lower back - s/p surgery   . Tear of lateral meniscus of left knee 09/30/2012    Past Surgical History:  Procedure Laterality  Date  . APPENDECTOMY  1971  . CERVICAL DISCECTOMY  1999   diskectomy with fixation:plate and screws  . COLONOSCOPY    . ESOPHAGOGASTRODUODENOSCOPY  12/2014  . KNEE ARTHROSCOPY W/ MENISCAL REPAIR  09   left knee  . L4-5 lumbar fusion  September 02, 2010   Dr. Hal Neer:  minimally invasive transforaminal interbody fusion with spacer  . OVARIAN CYST SURGERY  1972  . PATELLAR REEFING  '09   repair of fractured patella after fall  . SPINE SURGERY    . SYNOVIAL CYST EXCISION  2007   lumbar spine  . VENOUS ABLATION     for pain in the groin - VVTS did procedure. Has some residual discomfort at the  ablation site.   . WISDOM TOOTH  EXTRACTION      Current Medications: Current Meds  Medication Sig  . amitriptyline (ELAVIL) 10 MG tablet Take 20 mg by mouth at bedtime.   Marland Kitchen aspirin EC 81 MG tablet Take 1 tablet (81 mg total) by mouth daily.  . B Complex Vitamins (VITAMIN B COMPLEX PO) Take 1 tablet by mouth daily.   . cholecalciferol (VITAMIN D) 1000 units tablet Take 1,000 Units by mouth 2 (two) times daily.   . clonazePAM (KLONOPIN) 1 MG tablet Take 1 tablet (1 mg total) by mouth at bedtime as needed for anxiety.  Marland Kitchen esomeprazole (NEXIUM) 20 MG capsule Take 40 mg by mouth 2 (two) times daily.   . fluticasone (FLONASE) 50 MCG/ACT nasal spray Place 1 spray into both nostrils daily as needed for allergies.   . furosemide (LASIX) 20 MG tablet Take 1 tablet in the morning on Monday, Wednesday, and Friday.  . gabapentin (NEURONTIN) 100 MG capsule Take 100 mg by mouth daily as needed (nerve pain).   . Hyoscyamine Sulfate SL (LEVSIN/SL) 0.125 MG SUBL Place 0.125 mg under the tongue 4 (four) times daily.  Marland Kitchen levothyroxine (SYNTHROID, LEVOTHROID) 75 MCG tablet Take 75 mcg by mouth at bedtime.   . meloxicam (MOBIC) 15 MG tablet Take 15 mg by mouth daily as needed for pain.  . metoprolol succinate (TOPROL-XL) 25 MG 24 hr tablet Take 1 tablet (25 mg total) by mouth 2 (two) times daily. *PLEASE SCHEDULE FOLLOW UP  APPOINTMENT*  . Multiple Vitamins-Minerals (MULTIVITAMIN ADULT PO) Take 1 tablet by mouth daily.   . potassium chloride (K-DUR) 10 MEQ tablet Take 1 tablet in the morning on Monday, Wednesday, and Friday.  . sucralfate (CARAFATE) 1 G tablet Take 1 g by mouth daily.  Marland Kitchen tiZANidine (ZANAFLEX) 2 MG tablet Take 2 mg by mouth 2 (two) times daily. Reported on 04/05/2015  . traMADol (ULTRAM) 50 MG tablet Take 1 tablet (50 mg total) by mouth every 6 (six) hours as needed.  . TURMERIC PO Take 2,000 mg by mouth daily.  Marland Kitchen zinc gluconate 50 MG tablet Take 50 mg by mouth daily.     Allergies:   Epinephrine, Nsaids, Other, Pregabalin, Tolmetin, Prednisone, Tape, Tapentadol, Captopril, Lisinopril, Caine-1 [lidocaine hcl], Celecoxib, Codeine, Lidocaine hcl, Procaine, Sulfa antibiotics, and Sulfasalazine   Social History   Socioeconomic History  . Marital status: Married    Spouse name: Not on file  . Number of children: Not on file  . Years of education: 33  . Highest education level: Not on file  Occupational History  . 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    Employer: [employer]  Tobacco Use  . Smoking status: Never Smoker  . Smokeless tobacco: Never Used  Substance and Sexual Activity  . Alcohol use: No  . Drug use: No  . Sexual activity: Yes    Partners: Male    Birth control/protection: None  Other Topics Concern  . 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 1 dtr- '72; 1 son '73; 4 grandchildren, 2 step-grands. 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   Right handed   Lives at home with spouse    2-3 cups daily of caffeine    Social Determinants of Health   Financial Resource Strain:   . Difficulty of Paying Living Expenses: Not on file  Food Insecurity:   . Worried About Charity fundraiser in the  Last Year: Not on file  . Ran Out of Food in the  Last Year: Not on file  Transportation Needs:   . Lack of Transportation (Medical): Not on file  . Lack of Transportation (Non-Medical): Not on file  Physical Activity:   . Days of Exercise per Week: Not on file  . Minutes of Exercise per Session: Not on file  Stress:   . Feeling of Stress : Not on file  Social Connections:   . Frequency of Communication with Friends and Family: Not on file  . Frequency of Social Gatherings with Friends and Family: Not on file  . Attends Religious Services: Not on file  . Active Member of Clubs or Organizations: Not on file  . Attends Archivist Meetings: Not on file  . Marital Status: Not on file     Family History: The patient's family history includes Aortic aneurysm in her mother; COPD in her mother; Cancer in her father; Diabetes in her brother, father, and mother; Heart attack in her father; Heart disease in her father; Hyperlipidemia in her father; Hypertension in her father and mother; Hypothyroidism in her mother. ROS:   Please see the history of present illness.    All other systems reviewed and are negative.  EKGs/Labs/Other Studies Reviewed:    The following studies were reviewed today:  EKG:  EKG ordered today and personally reviewed.  The ekg ordered today demonstrates sinus rhythm normal  Recent Labs: 05/23/2018: Hemoglobin 14.3; Platelets 233 05/24/2018: ALT 42 10/24/2018: BUN 16; Creatinine, Ser 0.98; NT-Pro BNP 145; Potassium 4.5; Sodium 141  Recent Lipid Panel    Component Value Date/Time   CHOL 189 12/07/2011 1252   TRIG 127.0 12/07/2011 1252   HDL 49.30 12/07/2011 1252   CHOLHDL 4 12/07/2011 1252   VLDL 25.4 12/07/2011 1252   LDLCALC 114 (H) 12/07/2011 1252    Physical Exam:    VS:  BP 128/70   Pulse 66   Ht 5\' 4"  (1.626 m)   Wt 236 lb (107 kg)   SpO2 98%   BMI 40.51 kg/m     Wt Readings from Last 3 Encounters:  03/20/19 236 lb (107 kg)  10/24/18 238 lb 1.9 oz (108 kg)  05/23/18 222 lb (100.7 kg)       GEN:  Well nourished, well developed in no acute distress HEENT: Normal NECK: No JVD; No carotid bruits LYMPHATICS: No lymphadenopathy CARDIAC: RRR, no murmurs, rubs, gallops RESPIRATORY:  Clear to auscultation without rales, wheezing or rhonchi  ABDOMEN: Soft, non-tender, non-distended MUSCULOSKELETAL: Lateral lower extremity lymph edema; No deformity  SKIN: Warm and dry NEUROLOGIC:  Alert and oriented x 3 PSYCHIATRIC:  Normal affect    Signed, Shirlee More, MD  03/20/2019 1:57 PM    [facility]

## 2019-03-20 NOTE — Patient Instructions (Signed)
Medication Instructions:  Your physician recommends that you continue on your current medications as directed. Please refer to the Current Medication list given to you today.  *If you need a refill on your cardiac medications before your next appointment, please call your pharmacy*  Lab Work: NOne If you have labs (blood work) drawn today and your tests are completely normal, you will receive your results only by: Marland Kitchen MyChart Message (if you have MyChart) OR . A paper copy in the mail If you have any lab test that is abnormal or we need to change your treatment, we will call you to review the results.  Testing/Procedures: NOne  Follow-Up: At [facility], you and your health needs are our priority.  As part of our continuing mission to provide you with exceptional heart care, we have created designated Provider Care Teams.  These Care Teams include your primary Cardiologist (physician) and Advanced Practice Providers (APPs -  Physician Assistants and Nurse Practitioners) who all work together to provide you with the care you need, when you need it.  Your next appointment:   1 year(s)  The format for your next appointment:   In Person  Provider:   Shirlee More, MD  Other Instructions

## 2019-04-17 DIAGNOSIS — M79671 Pain in right foot: Secondary | ICD-10-CM | POA: Diagnosis not present

## 2019-04-17 DIAGNOSIS — R600 Localized edema: Secondary | ICD-10-CM | POA: Diagnosis not present

## 2019-04-17 DIAGNOSIS — M79605 Pain in left leg: Secondary | ICD-10-CM | POA: Diagnosis not present

## 2019-04-17 DIAGNOSIS — I890 Lymphedema, not elsewhere classified: Secondary | ICD-10-CM | POA: Diagnosis not present

## 2019-04-29 ENCOUNTER — Other Ambulatory Visit: Payer: Self-pay

## 2019-04-29 ENCOUNTER — Ambulatory Visit (INDEPENDENT_AMBULATORY_CARE_PROVIDER_SITE_OTHER): Payer: Medicare Other | Admitting: Podiatry

## 2019-04-29 ENCOUNTER — Ambulatory Visit (INDEPENDENT_AMBULATORY_CARE_PROVIDER_SITE_OTHER): Payer: Medicare Other

## 2019-04-29 ENCOUNTER — Other Ambulatory Visit: Payer: Self-pay | Admitting: Podiatry

## 2019-04-29 DIAGNOSIS — R7401 Elevation of levels of liver transaminase levels: Secondary | ICD-10-CM | POA: Diagnosis not present

## 2019-04-29 DIAGNOSIS — G5791 Unspecified mononeuropathy of right lower limb: Secondary | ICD-10-CM

## 2019-04-29 DIAGNOSIS — M778 Other enthesopathies, not elsewhere classified: Secondary | ICD-10-CM

## 2019-04-29 NOTE — Patient Instructions (Signed)
Something smoking

## 2019-04-29 NOTE — Progress Notes (Signed)
Subjective:  Patient ID: [name], female    DOB: 12/29/1950,  MRN: [id number] HPI No chief complaint on file.   69 y.o. female presents with the above complaint.   ROS: Denies fever chills nausea vomiting muscle aches pains calf pain back pain chest pain shortness of breath.  Past Medical History:  Diagnosis Date   Allergy    Anemia, iron deficiency 01/22/2011   May '12 - 13.8 Hgb, Nov 2, '12 Hgb 10.9    Arthritis    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   CAP (community acquired pneumonia) 10/21/2016   Chest pain in adult 07/08/2010   March 5, '09 negative cardiolite Sept 28, '10 negative nuclear stress (cardiolite) Dec 21, '11  Negative nuclear stress Brantley Fling) June 26, '14 Negative nuclear stress - [place] with Dr. Bettina Gavia per patient report.  Overview:  CAD risk score is low at 3%   Chondromalacia patellae 12/02/2012   Chronic back pain    Chronic kidney disease    Chronic knee pain    Chronic pain 09/22/2010   Multiple sources of pain: radicular pain from lumbar disease and post-operative pain; neck pain from cervical disk disease; left knee pain from trauma.  Plan - will wean off prednisone           Will continue percocet at 1  5/325 norco every 6 hours prn           NSAID - trial of meloxicam 71m g once a day           Increase gabapentin slowly to 600mg  tid and reassess           Executed a pain con   Chronic venous insufficiency 10/10/2013   Colitis 10/21/2016   Collagen vascular disease ([place])    Deformity of joint 04/23/2013   Depression with anxiety 09/21/2010   Derangement of knee, left 2009   started with fall at work. Has had repair patella and torn meniscus   DJD (degenerative joint disease) of knee 09/02/2012   Essential hypertension 07/08/2010   Long standing hypertension medically managed. Currently seeing Dr. Daneen Schick and Dr. Marin Roberts ([facility]) for cardiology.  Meds  HCT only  Overview:  Overview:  Long standing hypertension  medically managed. Currently seeing Dr. Daneen Schick and Dr. Bettina Gavia ([facility]) for cardiology.  Meds  HCT only  Last Assessment & Plan:  Formatting of this note may be different from the original. BP Readings   Fever chills 10/21/2016   Fibromyalgia 02/27/2017   High cholesterol    Hx of appendectomy    Hypertension    Knee internal derangement 07/08/2010   Left knee: Feb '09 medial and lateral meniscal tears by MRI                  April '09 arthroscopic surgery    Melanoma ([place]) 11/19/2015   Migraine headache    hormonal related - less frequent off hormone replacement   Migraines    Neuropathy 04/23/2013   Peripheral cyanosis 09/28/2012   Full evaluation by Dr. Tobie Lords for rheumatology - no evidence of Raynaud's syndrome or disease; no evidence of underlying rheumatologic disease or malignancy. Most probable cause is neurogenic.    Pseudoarthrosis of lumbar spine 01/02/2011   Psoriasis 04/23/2013   Raynaud disease    Routine health maintenance 08/17/2011   Colonoscopy - no record in EPIC of colonoscopy  Immunizations - Tdap June '13.     Salmonella enteritis 10/23/2016   Sepsis ([place]) 10/23/2016   Spinal  stenosis 08/14/2010   history of cervical laminectomy, lumbar diskectomy with fixation devices and redo surgery.    Status post lumbar spinal fusion 10/02/2011   SVT (supraventricular tachycardia) ([facility])    Synovial cyst of lumbar facet joint    lower back - s/p surgery    Tear of lateral meniscus of left knee 09/30/2012   Past Surgical History:  Procedure Laterality Date   APPENDECTOMY  1971   CERVICAL DISCECTOMY  1999   diskectomy with fixation:plate and screws   COLONOSCOPY     ESOPHAGOGASTRODUODENOSCOPY  12/2014   KNEE ARTHROSCOPY W/ MENISCAL REPAIR  09   left knee   L4-5 lumbar fusion  September 02, 2010   Dr. Hal Neer:  minimally invasive transforaminal interbody fusion with spacer   OVARIAN CYST SURGERY  1972   PATELLAR REEFING  '09   repair of  fractured patella after fall   [place]  2007   lumbar spine   VENOUS ABLATION     for pain in the groin - VVTS did procedure. Has some residual discomfort at the  ablation site.    WISDOM TOOTH EXTRACTION      Current Outpatient Medications:    amitriptyline (ELAVIL) 10 MG tablet, Take 20 mg by mouth at bedtime. , Disp: , Rfl:    aspirin EC 81 MG tablet, Take 1 tablet (81 mg total) by mouth daily., Disp: , Rfl:    B Complex Vitamins (VITAMIN B COMPLEX PO), Take 1 tablet by mouth daily. , Disp: , Rfl:    cholecalciferol (VITAMIN D) 1000 units tablet, Take 1,000 Units by mouth 2 (two) times daily. , Disp: , Rfl:    clonazePAM (KLONOPIN) 1 MG tablet, Take 1 tablet (1 mg total) by mouth at bedtime as needed for anxiety., Disp: 30 tablet, Rfl: 0   esomeprazole (NEXIUM) 20 MG capsule, Take 40 mg by mouth 2 (two) times daily. , Disp: , Rfl:    fluticasone (FLONASE) 50 MCG/ACT nasal spray, Place 1 spray into both nostrils daily as needed for allergies. , Disp: , Rfl:    furosemide (LASIX) 20 MG tablet, Take 1 tablet in the morning on Monday, Wednesday, and Friday., Disp: 12 tablet, Rfl: 3   gabapentin (NEURONTIN) 100 MG capsule, Take 100 mg by mouth daily as needed (nerve pain). , Disp: , Rfl:    Hyoscyamine Sulfate SL (LEVSIN/SL) 0.125 MG SUBL, Place 0.125 mg under the tongue 4 (four) times daily., Disp: 40 each, Rfl: 0   levothyroxine (SYNTHROID, LEVOTHROID) 75 MCG tablet, Take 75 mcg by mouth at bedtime. , Disp: , Rfl:    meloxicam (MOBIC) 15 MG tablet, Take 15 mg by mouth daily as needed for pain., Disp: , Rfl:    metoprolol succinate (TOPROL-XL) 25 MG 24 hr tablet, Take 1 tablet (25 mg total) by mouth 2 (two) times daily. *PLEASE SCHEDULE FOLLOW UP APPOINTMENT*, Disp: 180 tablet, Rfl: 0   Multiple Vitamins-Minerals (MULTIVITAMIN ADULT PO), Take 1 tablet by mouth daily. , Disp: , Rfl:    potassium chloride (K-DUR) 10 MEQ tablet, Take 1 tablet in  the morning on Monday, Wednesday, and Friday., Disp: 12 tablet, Rfl: 3   sucralfate (CARAFATE) 1 G tablet, Take 1 g by mouth daily., Disp: , Rfl:    tiZANidine (ZANAFLEX) 2 MG tablet, Take 2 mg by mouth 2 (two) times daily. Reported on 04/05/2015, Disp: , Rfl:    traMADol (ULTRAM) 50 MG tablet, Take 1 tablet (50 mg total)  by mouth every 6 (six) hours as needed., Disp: 8 tablet, Rfl: 0   TURMERIC PO, Take 2,000 mg by mouth daily., Disp: , Rfl:    zinc gluconate 50 MG tablet, Take 50 mg by mouth daily., Disp: , Rfl:   Allergies  Allergen Reactions   Epinephrine Palpitations    NASAL SPRAY Cardiac dysrhythmia NASAL SPRAY NASAL SPRAY NASAL SPRAY    Nsaids Swelling    swelling swelling swelling    Other Palpitations and Swelling    Glucocorticoids/Steroids Glucocorticoids/Steroids **ANTI INFLAMMATORY DRUGS** Glucocorticoids/Steroids   Pregabalin Anaphylaxis   Tolmetin Swelling   Prednisone Swelling   Tape Other (See Comments) and Rash    *adhesive* Reaction- blisters  Rips her skin Rips her skin *adhesive* Reaction- blisters  Rips her skin blisters    Tapentadol Other (See Comments)    *adhesive* Reaction- blisters    Captopril    Lisinopril Swelling and Other (See Comments)    Dizziness Dizziness    Caine-1 [Lidocaine Hcl] Palpitations   Celecoxib Palpitations    Cardiac dysrhythmia    Codeine Palpitations    Cardiac dysrhythmia Cardiac dysrhythmia    Lidocaine Hcl Palpitations   Procaine Other (See Comments) and Palpitations    dizziness Dizziness dizziness    Sulfa Antibiotics Palpitations    Cardiac dysrhythmia    Sulfasalazine Palpitations   Review of Systems Objective:  There were no vitals filed for this visit.  General: Well developed, nourished, in no acute distress, alert and oriented x3   Dermatological: Skin is warm, dry and supple bilateral. Nails x 10 are well maintained; remaining integument appears unremarkable at  this time. There are no open sores, no preulcerative lesions, no rash or signs of infection present.  Vascular: Dorsalis Pedis artery and Posterior Tibial artery pedal pulses are 2/4 bilateral with immedate capillary fill time. Pedal hair growth present. No varicosities and no lower extremity edema present bilateral.   Neruologic: Grossly intact via light touch bilateral. Vibratory intact via tuning fork bilateral. Protective threshold with Semmes Wienstein monofilament intact to all pedal sites bilateral. Patellar and Achilles deep tendon reflexes 2+ bilateral. No Babinski or clonus noted bilateral.   Musculoskeletal: No gross boney pedal deformities bilateral. No pain, crepitus, or limitation noted with foot and ankle range of motion bilateral. Muscular strength 5/5 in all groups tested bilateral.  Mild tenderness on palpation of the subtalar joint right over left  Gait: Unassisted, Nonantalgic.    Radiographs:  Radiographs taken today demonstrate no significant osseous abnormalities other than some early osteoarthritic changes of the subtalar joint and the second TMT.  Assessment & Plan:   Assessment: Probable neuritis associated with all of her back problems neuropathy associated with the back problems probably worsened by the subtalar joint capsulitis.  Plan: Discussed etiology pathology and surgical therapies at this point I recommended topical Voltaren cream and also increasing her gabapentin to 200 or 300 at night.  I will follow-up with her in about 6 weeks to make sure that this is working well for her may need to try Lyrica.     Kden Wagster T. [place], [place]

## 2019-04-30 ENCOUNTER — Ambulatory Visit: Payer: Medicare Other | Admitting: Podiatry

## 2019-05-12 ENCOUNTER — Other Ambulatory Visit: Payer: Self-pay | Admitting: Cardiology

## 2019-05-12 DIAGNOSIS — I129 Hypertensive chronic kidney disease with stage 1 through stage 4 chronic kidney disease, or unspecified chronic kidney disease: Secondary | ICD-10-CM

## 2019-05-16 DIAGNOSIS — R600 Localized edema: Secondary | ICD-10-CM | POA: Diagnosis not present

## 2019-06-17 ENCOUNTER — Ambulatory Visit: Payer: Medicare Other | Admitting: Podiatry

## 2019-07-02 DIAGNOSIS — G894 Chronic pain syndrome: Secondary | ICD-10-CM | POA: Diagnosis not present

## 2019-07-02 DIAGNOSIS — M545 Low back pain: Secondary | ICD-10-CM | POA: Diagnosis not present

## 2019-07-02 DIAGNOSIS — M47896 Other spondylosis, lumbar region: Secondary | ICD-10-CM | POA: Diagnosis not present

## 2019-07-02 DIAGNOSIS — M4326 Fusion of spine, lumbar region: Secondary | ICD-10-CM | POA: Diagnosis not present

## 2019-07-22 DIAGNOSIS — N182 Chronic kidney disease, stage 2 (mild): Secondary | ICD-10-CM | POA: Diagnosis not present

## 2019-07-22 DIAGNOSIS — R300 Dysuria: Secondary | ICD-10-CM | POA: Diagnosis not present

## 2019-07-22 DIAGNOSIS — F419 Anxiety disorder, unspecified: Secondary | ICD-10-CM | POA: Diagnosis not present

## 2019-07-22 DIAGNOSIS — E039 Hypothyroidism, unspecified: Secondary | ICD-10-CM | POA: Diagnosis not present

## 2019-07-22 DIAGNOSIS — I129 Hypertensive chronic kidney disease with stage 1 through stage 4 chronic kidney disease, or unspecified chronic kidney disease: Secondary | ICD-10-CM | POA: Diagnosis not present

## 2019-07-28 DIAGNOSIS — E039 Hypothyroidism, unspecified: Secondary | ICD-10-CM | POA: Diagnosis not present

## 2019-07-28 DIAGNOSIS — I83812 Varicose veins of left lower extremities with pain: Secondary | ICD-10-CM | POA: Diagnosis not present

## 2019-07-28 DIAGNOSIS — N182 Chronic kidney disease, stage 2 (mild): Secondary | ICD-10-CM | POA: Diagnosis not present

## 2019-07-28 DIAGNOSIS — I8392 Asymptomatic varicose veins of left lower extremity: Secondary | ICD-10-CM | POA: Diagnosis not present

## 2019-07-28 DIAGNOSIS — I4891 Unspecified atrial fibrillation: Secondary | ICD-10-CM | POA: Diagnosis not present

## 2019-07-28 DIAGNOSIS — I739 Peripheral vascular disease, unspecified: Secondary | ICD-10-CM | POA: Diagnosis not present

## 2019-07-28 DIAGNOSIS — I872 Venous insufficiency (chronic) (peripheral): Secondary | ICD-10-CM | POA: Diagnosis not present

## 2019-07-28 DIAGNOSIS — Z79890 Hormone replacement therapy (postmenopausal): Secondary | ICD-10-CM | POA: Diagnosis not present

## 2019-07-28 DIAGNOSIS — I471 Supraventricular tachycardia: Secondary | ICD-10-CM | POA: Diagnosis not present

## 2019-07-28 DIAGNOSIS — Z7982 Long term (current) use of aspirin: Secondary | ICD-10-CM | POA: Diagnosis not present

## 2019-07-28 DIAGNOSIS — Z79899 Other long term (current) drug therapy: Secondary | ICD-10-CM | POA: Diagnosis not present

## 2019-07-31 DIAGNOSIS — Z92241 Personal history of systemic steroid therapy: Secondary | ICD-10-CM | POA: Diagnosis not present

## 2019-07-31 DIAGNOSIS — M8588 Other specified disorders of bone density and structure, other site: Secondary | ICD-10-CM | POA: Diagnosis not present

## 2019-07-31 DIAGNOSIS — E039 Hypothyroidism, unspecified: Secondary | ICD-10-CM | POA: Diagnosis not present

## 2019-09-30 DIAGNOSIS — L239 Allergic contact dermatitis, unspecified cause: Secondary | ICD-10-CM | POA: Diagnosis not present

## 2019-09-30 DIAGNOSIS — Z85820 Personal history of malignant melanoma of skin: Secondary | ICD-10-CM | POA: Diagnosis not present

## 2019-10-28 DIAGNOSIS — Z1231 Encounter for screening mammogram for malignant neoplasm of breast: Secondary | ICD-10-CM | POA: Diagnosis not present

## 2019-11-08 ENCOUNTER — Other Ambulatory Visit: Payer: Self-pay | Admitting: Cardiology

## 2019-11-08 DIAGNOSIS — I129 Hypertensive chronic kidney disease with stage 1 through stage 4 chronic kidney disease, or unspecified chronic kidney disease: Secondary | ICD-10-CM

## 2019-11-10 ENCOUNTER — Other Ambulatory Visit: Payer: Self-pay | Admitting: Cardiology

## 2019-11-10 DIAGNOSIS — I129 Hypertensive chronic kidney disease with stage 1 through stage 4 chronic kidney disease, or unspecified chronic kidney disease: Secondary | ICD-10-CM

## 2019-11-10 DIAGNOSIS — N181 Chronic kidney disease, stage 1: Secondary | ICD-10-CM

## 2019-11-10 MED ORDER — METOPROLOL SUCCINATE ER 25 MG PO TB24: 25.0000 mg | ORAL_TABLET | Freq: Two times a day (BID) | ORAL | 2 refills | Status: DC

## 2019-11-10 NOTE — Telephone Encounter (Signed)
Refill for Metoprolol succinate 25 mg #180 with 2 refills sent to [place], [place] as requested

## 2019-11-10 NOTE — Telephone Encounter (Signed)
*  STAT* If patient is at the pharmacy, call can be transferred to refill team.   1. Which medications need to be refilled? (please list name of each medication and dose if known) metoprolol succinate (TOPROL-XL) 25 MG 24 hr tablet  2. Which pharmacy/location (including street and city if local pharmacy) is medication to be sent to? [facility] [place] [facility] [place]  3. Do they need a 30 day or 90 day supply? 90 day supply

## 2019-11-12 DIAGNOSIS — Z03818 Encounter for observation for suspected exposure to other biological agents ruled out: Secondary | ICD-10-CM | POA: Diagnosis not present

## 2019-11-12 DIAGNOSIS — B349 Viral infection, unspecified: Secondary | ICD-10-CM | POA: Diagnosis not present

## 2019-11-12 DIAGNOSIS — R509 Fever, unspecified: Secondary | ICD-10-CM | POA: Diagnosis not present

## 2019-12-09 DIAGNOSIS — Z23 Encounter for immunization: Secondary | ICD-10-CM | POA: Diagnosis not present

## 2019-12-15 DIAGNOSIS — M4326 Fusion of spine, lumbar region: Secondary | ICD-10-CM | POA: Diagnosis not present

## 2019-12-15 DIAGNOSIS — M47896 Other spondylosis, lumbar region: Secondary | ICD-10-CM | POA: Diagnosis not present

## 2019-12-15 DIAGNOSIS — G894 Chronic pain syndrome: Secondary | ICD-10-CM | POA: Diagnosis not present

## 2019-12-17 DIAGNOSIS — R109 Unspecified abdominal pain: Secondary | ICD-10-CM | POA: Diagnosis not present

## 2019-12-17 DIAGNOSIS — K219 Gastro-esophageal reflux disease without esophagitis: Secondary | ICD-10-CM | POA: Diagnosis not present

## 2019-12-22 DIAGNOSIS — R0789 Other chest pain: Secondary | ICD-10-CM | POA: Diagnosis not present

## 2019-12-22 DIAGNOSIS — R079 Chest pain, unspecified: Secondary | ICD-10-CM | POA: Diagnosis not present

## 2020-01-21 DIAGNOSIS — L821 Other seborrheic keratosis: Secondary | ICD-10-CM | POA: Diagnosis not present

## 2020-01-21 DIAGNOSIS — L718 Other rosacea: Secondary | ICD-10-CM | POA: Diagnosis not present

## 2020-01-21 DIAGNOSIS — Z85820 Personal history of malignant melanoma of skin: Secondary | ICD-10-CM | POA: Diagnosis not present

## 2020-01-22 IMAGING — CT CT ABDOMEN AND PELVIS WITH CONTRAST
2 of 5 series · 16 of 46 positions shown, 18 images · IV contrast (ISOVUE)
Comparison: Lumbar spine MRI 06/09/2017, CT AP 03/28/2017

CLINICAL DATA: Nausea and abdominal pain for 2 days.

EXAM:
CT ABDOMEN AND PELVIS WITH CONTRAST
TECHNIQUE: Multidetector CT imaging of the abdomen and pelvis was performed
using the standard protocol following bolus administration of
intravenous contrast.
CONTRAST:  100mL 85W39U-JPP IOPAMIDOL (85W39U-JPP) INJECTION 61%,
30mL OMNIPAQUE IOHEXOL 300 MG/ML SOLN

[Series 2: axial st · axial · 0.74mm/px · z∈[+1156,+1551]mm · 13 of 91 slices shown, 15 images]
[im 6/91  soft-tissue]
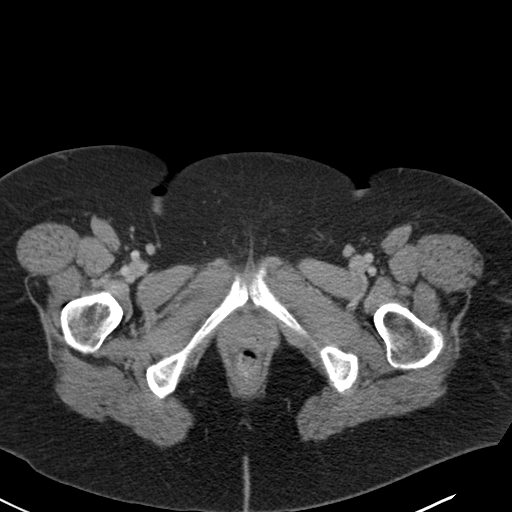
[im 6/91  bone]
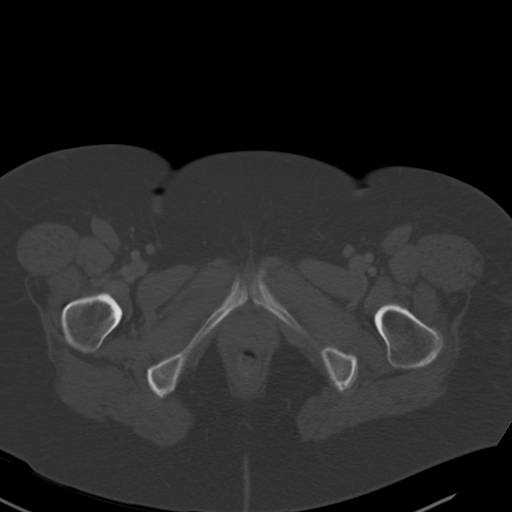
[im 12/91  soft-tissue]
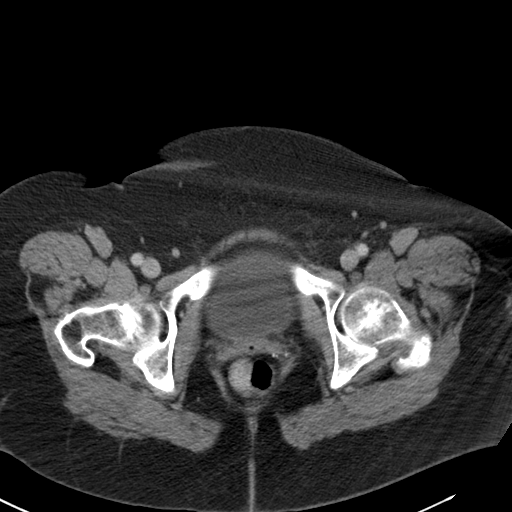
[im 17/91  soft-tissue]
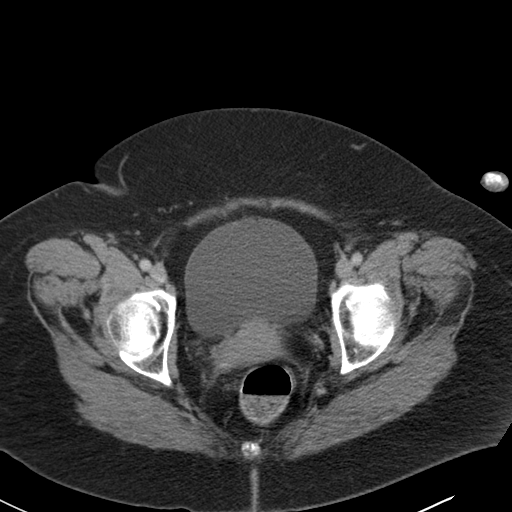
[im 29/91  soft-tissue]
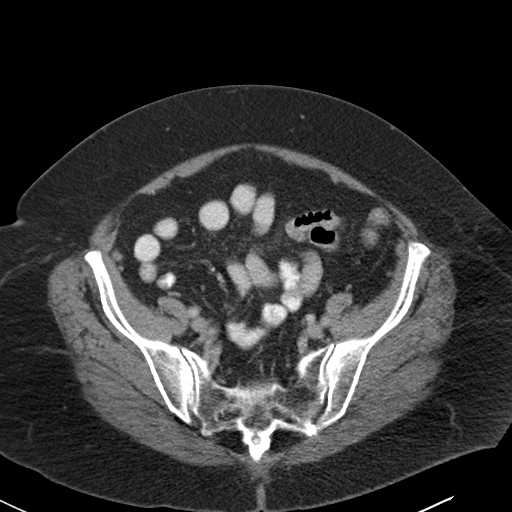
[im 34/91  soft-tissue]
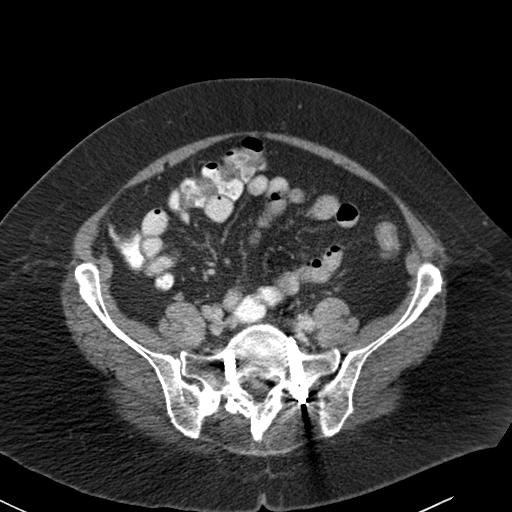
[im 40/91  soft-tissue]
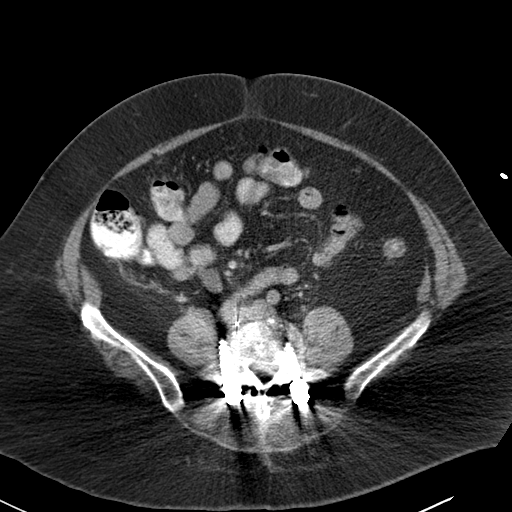
[im 46/91  soft-tissue]
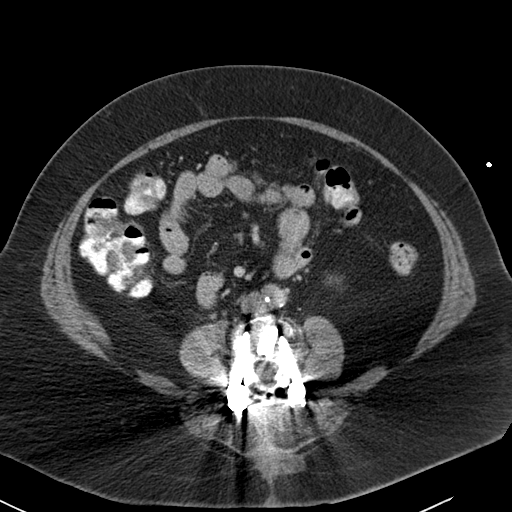
[im 51/91  soft-tissue]
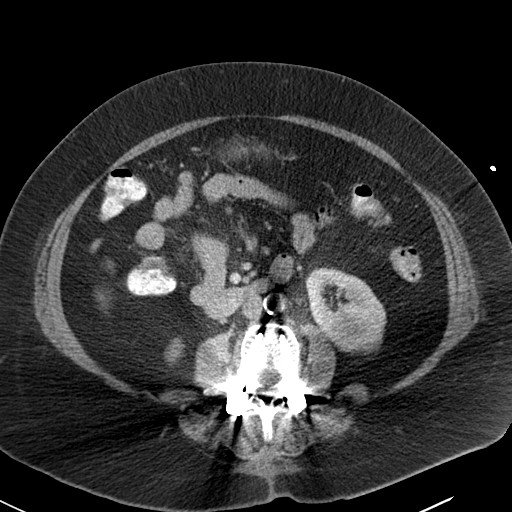
[im 57/91  soft-tissue]
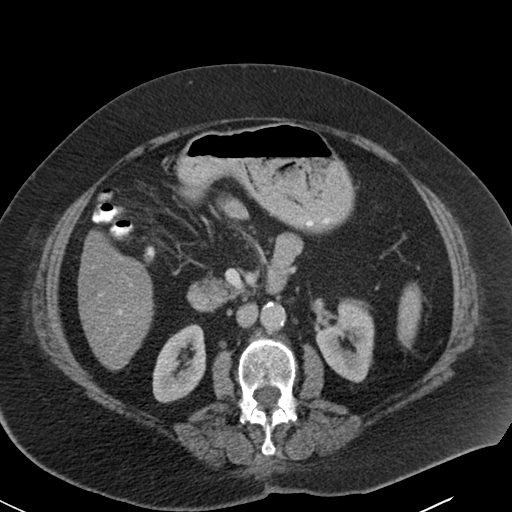
[im 57/91  bone]
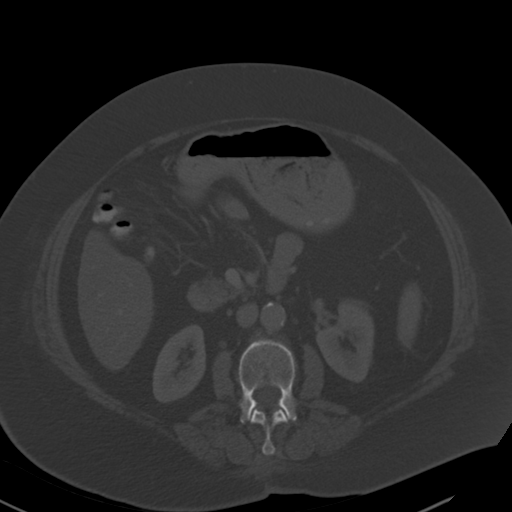
[im 62/91  soft-tissue]
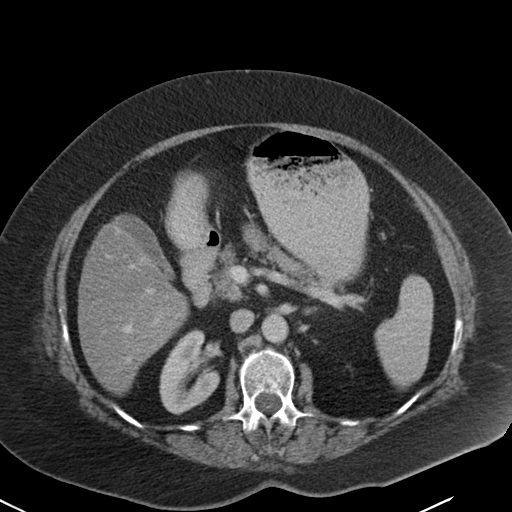
[im 74/91  soft-tissue]
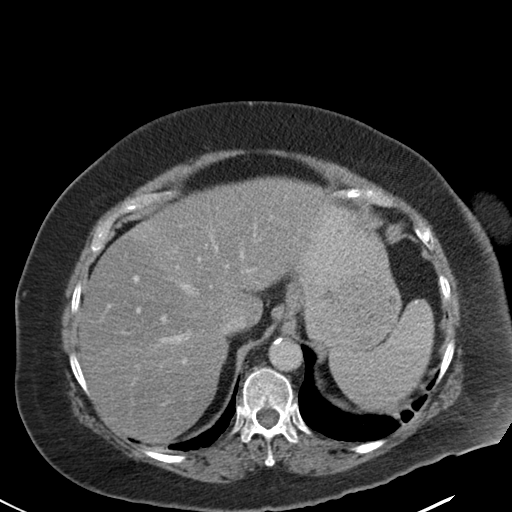
[im 79/91  soft-tissue]
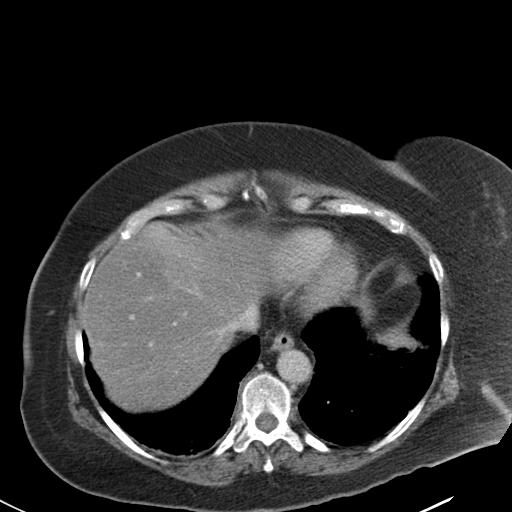
[im 85/91  soft-tissue]
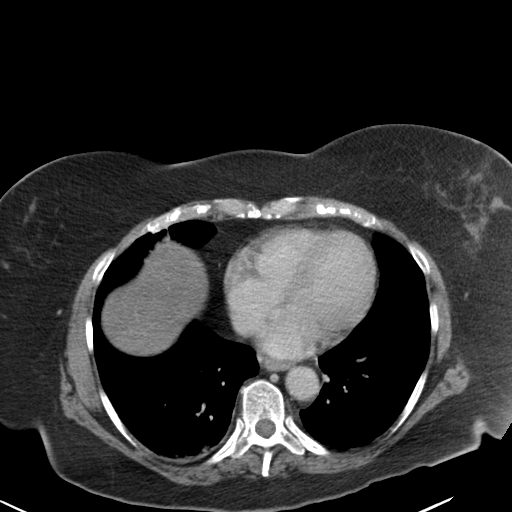

[Series 5: coronal st · coronal · 0.74mm/px · 3 of 154 slices shown]
[im 52/154  soft-tissue]
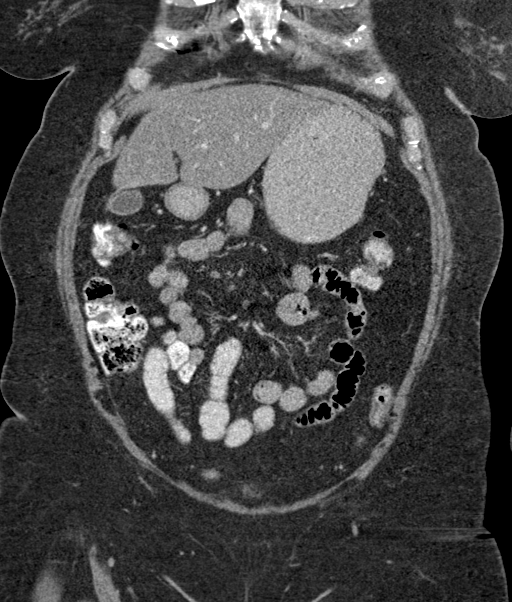
[im 69/154  soft-tissue]
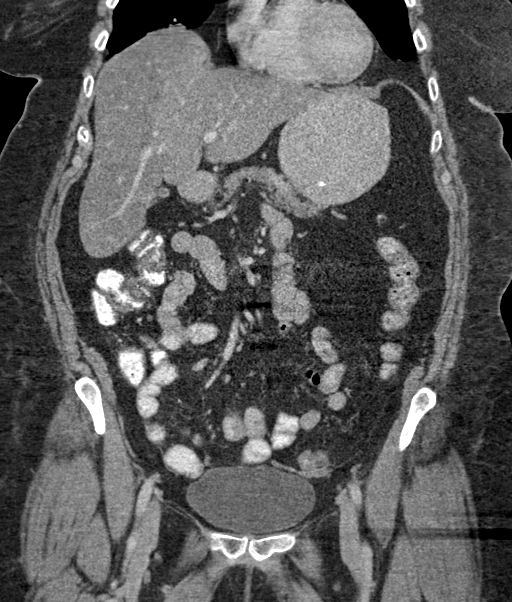
[im 86/154  soft-tissue]
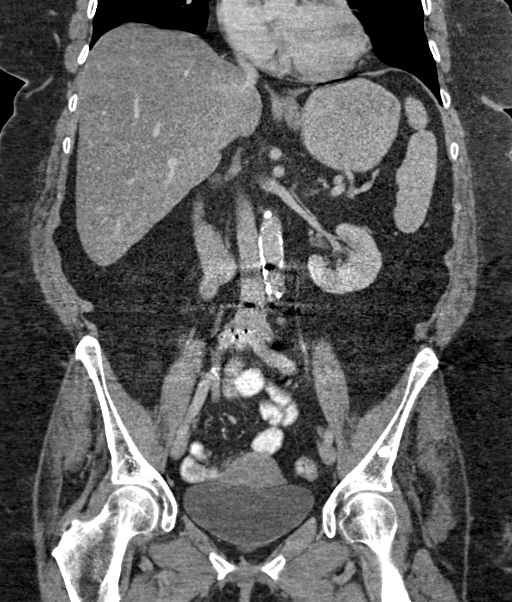

[16 of 46 positions shown; findings below may reference images not displayed]

FINDINGS: Lower chest: Top normal heart size without pericardial effusion or
thickening. Trace right pleural effusion and atelectasis.

Hepatobiliary: Steatosis of the liver with focal fatty sparing near
the gallbladder fossa. Unremarkable gallbladder without stones. No
biliary dilatation.

Pancreas: Atrophic pancreas without inflammation or mass. No ductal
dilatation.

Spleen: Normal

Adrenals/Urinary Tract: Normal bilateral adrenal glands, kidneys and
ureters. The urinary bladder is unremarkable for the degree of
distention.

Stomach/Bowel: Status post appendectomy. Enteric contrast is seen
within the stomach, small and large bowel without obstruction or
inflammation. Mural thickening or significant diverticular disease.

Vascular/Lymphatic: Aortic atherosclerosis. No enlarged abdominal or
pelvic lymph nodes.

Reproductive: Uterus and bilateral adnexa are unremarkable.

Other: No abdominal wall hernia or abnormality. No abdominopelvic
ascites.

Musculoskeletal: L3 through S1 PLIF with interbody spacer at L3-4.
Further progression of degenerative disc disease at L1-2 and L2-3
since prior with stable grade 1 retrolisthesis of L2 on L3.
IMPRESSION: 1. No acute bowel obstruction or inflammation. No findings of
gastroenteritis colitis. Status post appendectomy.
2. Steatosis of liver.
3. L3-S1 PLIF with complications. Progression of degenerative disc
disease at L1-2 and L2-3 since prior. Stable grade 1 retrolisthesis
of L2 on L3.

## 2020-01-29 ENCOUNTER — Other Ambulatory Visit: Payer: Self-pay | Admitting: Orthopaedic Surgery

## 2020-01-29 DIAGNOSIS — M4326 Fusion of spine, lumbar region: Secondary | ICD-10-CM

## 2020-02-19 DIAGNOSIS — H5213 Myopia, bilateral: Secondary | ICD-10-CM | POA: Diagnosis not present

## 2020-02-19 DIAGNOSIS — H04123 Dry eye syndrome of bilateral lacrimal glands: Secondary | ICD-10-CM | POA: Diagnosis not present

## 2020-02-19 DIAGNOSIS — H26493 Other secondary cataract, bilateral: Secondary | ICD-10-CM | POA: Diagnosis not present

## 2020-02-23 ENCOUNTER — Other Ambulatory Visit: Payer: Medicare Other

## 2020-02-26 DIAGNOSIS — M797 Fibromyalgia: Secondary | ICD-10-CM | POA: Diagnosis not present

## 2020-02-26 DIAGNOSIS — R051 Acute cough: Secondary | ICD-10-CM | POA: Diagnosis not present

## 2020-02-26 DIAGNOSIS — K589 Irritable bowel syndrome without diarrhea: Secondary | ICD-10-CM | POA: Diagnosis not present

## 2020-02-26 DIAGNOSIS — M8588 Other specified disorders of bone density and structure, other site: Secondary | ICD-10-CM | POA: Diagnosis not present

## 2020-02-26 DIAGNOSIS — M810 Age-related osteoporosis without current pathological fracture: Secondary | ICD-10-CM | POA: Diagnosis not present

## 2020-02-26 DIAGNOSIS — E039 Hypothyroidism, unspecified: Secondary | ICD-10-CM | POA: Diagnosis not present

## 2020-02-26 DIAGNOSIS — I129 Hypertensive chronic kidney disease with stage 1 through stage 4 chronic kidney disease, or unspecified chronic kidney disease: Secondary | ICD-10-CM | POA: Diagnosis not present

## 2020-02-26 DIAGNOSIS — F419 Anxiety disorder, unspecified: Secondary | ICD-10-CM | POA: Diagnosis not present

## 2020-02-26 DIAGNOSIS — Z0000 Encounter for general adult medical examination without abnormal findings: Secondary | ICD-10-CM | POA: Diagnosis not present

## 2020-02-26 DIAGNOSIS — N63 Unspecified lump in unspecified breast: Secondary | ICD-10-CM | POA: Diagnosis not present

## 2020-02-26 DIAGNOSIS — K219 Gastro-esophageal reflux disease without esophagitis: Secondary | ICD-10-CM | POA: Diagnosis not present

## 2020-02-26 DIAGNOSIS — Z1389 Encounter for screening for other disorder: Secondary | ICD-10-CM | POA: Diagnosis not present

## 2020-03-01 DIAGNOSIS — Z20822 Contact with and (suspected) exposure to covid-19: Secondary | ICD-10-CM | POA: Diagnosis not present

## 2020-03-01 DIAGNOSIS — M791 Myalgia, unspecified site: Secondary | ICD-10-CM | POA: Diagnosis not present

## 2020-03-22 ENCOUNTER — Other Ambulatory Visit: Payer: Medicare Other

## 2020-03-22 DIAGNOSIS — N6489 Other specified disorders of breast: Secondary | ICD-10-CM | POA: Diagnosis not present

## 2020-03-22 DIAGNOSIS — R928 Other abnormal and inconclusive findings on diagnostic imaging of breast: Secondary | ICD-10-CM | POA: Diagnosis not present

## 2020-03-31 DIAGNOSIS — D2262 Melanocytic nevi of left upper limb, including shoulder: Secondary | ICD-10-CM | POA: Diagnosis not present

## 2020-03-31 DIAGNOSIS — D1801 Hemangioma of skin and subcutaneous tissue: Secondary | ICD-10-CM | POA: Diagnosis not present

## 2020-03-31 DIAGNOSIS — L812 Freckles: Secondary | ICD-10-CM | POA: Diagnosis not present

## 2020-03-31 DIAGNOSIS — D225 Melanocytic nevi of trunk: Secondary | ICD-10-CM | POA: Diagnosis not present

## 2020-03-31 DIAGNOSIS — L570 Actinic keratosis: Secondary | ICD-10-CM | POA: Diagnosis not present

## 2020-03-31 DIAGNOSIS — L603 Nail dystrophy: Secondary | ICD-10-CM | POA: Diagnosis not present

## 2020-03-31 DIAGNOSIS — Z85820 Personal history of malignant melanoma of skin: Secondary | ICD-10-CM | POA: Diagnosis not present

## 2020-03-31 DIAGNOSIS — D2261 Melanocytic nevi of right upper limb, including shoulder: Secondary | ICD-10-CM | POA: Diagnosis not present

## 2020-04-06 DIAGNOSIS — E7800 Pure hypercholesterolemia, unspecified: Secondary | ICD-10-CM | POA: Insufficient documentation

## 2020-04-06 DIAGNOSIS — N189 Chronic kidney disease, unspecified: Secondary | ICD-10-CM | POA: Insufficient documentation

## 2020-04-06 DIAGNOSIS — M359 Systemic involvement of connective tissue, unspecified: Secondary | ICD-10-CM | POA: Insufficient documentation

## 2020-04-06 DIAGNOSIS — I10 Essential (primary) hypertension: Secondary | ICD-10-CM | POA: Insufficient documentation

## 2020-04-06 DIAGNOSIS — G8929 Other chronic pain: Secondary | ICD-10-CM | POA: Insufficient documentation

## 2020-04-06 DIAGNOSIS — Z9049 Acquired absence of other specified parts of digestive tract: Secondary | ICD-10-CM | POA: Insufficient documentation

## 2020-04-06 DIAGNOSIS — I7300 Raynaud's syndrome without gangrene: Secondary | ICD-10-CM | POA: Insufficient documentation

## 2020-04-06 DIAGNOSIS — T7840XA Allergy, unspecified, initial encounter: Secondary | ICD-10-CM | POA: Insufficient documentation

## 2020-04-06 DIAGNOSIS — M25569 Pain in unspecified knee: Secondary | ICD-10-CM | POA: Insufficient documentation

## 2020-04-06 DIAGNOSIS — G43909 Migraine, unspecified, not intractable, without status migrainosus: Secondary | ICD-10-CM | POA: Insufficient documentation

## 2020-04-06 DIAGNOSIS — M1990 Unspecified osteoarthritis, unspecified site: Secondary | ICD-10-CM | POA: Insufficient documentation

## 2020-04-06 DIAGNOSIS — M7138 Other bursal cyst, other site: Secondary | ICD-10-CM | POA: Insufficient documentation

## 2020-04-14 ENCOUNTER — Encounter: Payer: Self-pay | Admitting: Cardiology

## 2020-04-14 ENCOUNTER — Ambulatory Visit (INDEPENDENT_AMBULATORY_CARE_PROVIDER_SITE_OTHER): Payer: Medicare Other | Admitting: Cardiology

## 2020-04-14 ENCOUNTER — Ambulatory Visit: Payer: Medicare Other | Admitting: Cardiology

## 2020-04-14 ENCOUNTER — Other Ambulatory Visit: Payer: Self-pay

## 2020-04-14 VITALS — BP 116/60 | HR 72 | Ht 64.0 in | Wt 208.0 lb

## 2020-04-14 DIAGNOSIS — I491 Atrial premature depolarization: Secondary | ICD-10-CM

## 2020-04-14 DIAGNOSIS — I890 Lymphedema, not elsewhere classified: Secondary | ICD-10-CM | POA: Diagnosis not present

## 2020-04-14 DIAGNOSIS — I129 Hypertensive chronic kidney disease with stage 1 through stage 4 chronic kidney disease, or unspecified chronic kidney disease: Secondary | ICD-10-CM | POA: Diagnosis not present

## 2020-04-14 DIAGNOSIS — I471 Supraventricular tachycardia: Secondary | ICD-10-CM | POA: Diagnosis not present

## 2020-04-14 DIAGNOSIS — N181 Chronic kidney disease, stage 1: Secondary | ICD-10-CM | POA: Diagnosis not present

## 2020-04-14 MED ORDER — METOPROLOL SUCCINATE ER 25 MG PO TB24: 25.0000 mg | ORAL_TABLET | Freq: Two times a day (BID) | ORAL | 2 refills | Status: DC

## 2020-04-14 NOTE — Progress Notes (Signed)
Cardiology Office Note:    Date:  04/14/2020   ID:  [name], [place] October 15, 1950, MRN [id number]  PCP:  Kelton Pillar, MD  Cardiologist:  Shirlee More, MD    Referring MD: Kelton Pillar, MD    ASSESSMENT:    1. PAT (paroxysmal atrial tachycardia) ([place])   2. Hypertensive kidney disease with stage 1 chronic kidney disease   3. Lymphedema of both lower extremities   4. PAC (premature atrial contraction)    PLAN:    In order of problems listed above:  1. Overall she has really done quite well weight loss is improved the quality of her life her lymphedema is better and she has little or no arrhythmia with her low-dose beta-blocker.  I reassured her that heart rates in the 50s or not a problem I asked her to send me a message in my chart if she finds numbers less than 50 frequently and encourage her to continue her current beta-blocker as previously palpitation was a severe problem. 2. Stable hypertension BP at target continue her current treatment including beta-blocker and presently is not taking a diuretic 3. Improved with conservative management and weight loss   Next appointment: 1 year I asked her to communicate through my chart if she was having bradycardia or worsened symptoms   Medication Adjustments/Labs and Tests Ordered: Current medicines are reviewed at length with the patient today.  Concerns regarding medicines are outlined above.  No orders of the defined types were placed in this encounter.  Meds ordered this encounter  Medications  . metoprolol succinate (TOPROL-XL) 25 MG 24 hr tablet    Sig: Take 1 tablet (25 mg total) by mouth 2 (two) times daily.    Dispense:  180 tablet    Refill:  2    Chief Complaint  Patient presents with  . Follow-up    For atrial and ventricular arrhythmia on beta-blocker    History of Present Illness:    [name] is a 70 y.o. female with a hx of palpitation with APCs PVCs and atrial tachycardia,  hypertension with CKD chronic pain syndrome hypothyroidism and lymphedema last seen 03/20/2019. Compliance with diet, lifestyle and medications: Yes  Overall she is doing much better. She is focus is lost 40 pounds and is markedly improved her quality of life and lessened her peripheral edema. She continues to use the lymphedema compressive device for her legs. No shortness of breath chest pain palpitation or syncope. She follows her heart rhythm through an apple watch and her smart phone she has occasional PVCs but no recurrent episodes of atrial tachycardia on her beta-blocker. She is concerned because of occasional heart rates of less than 60 bpm. Past Medical History:  Diagnosis Date  . Allergy   . Anemia, iron deficiency 01/22/2011   May '12 - 13.8 Hgb, Nov 2, '12 Hgb 10.9   . APC (atrial premature contractions) 02/17/2018  . Arthritis   . 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 CAP (community acquired pneumonia) 10/21/2016  . Chest pain in adult 07/08/2010   March 5, '09 negative cardiolite Sept 28, '10 negative nuclear stress (cardiolite) Dec 21, '11  Negative nuclear stress [facility]ling) June 26, '14 Negative nuclear stress - [place] with Dr. Bettina Gavia per patient report.  Overview:  CAD risk score is low at 3%  . Chondromalacia patellae 12/02/2012  . Chronic back pain   . Chronic kidney disease   . Chronic knee pain   . Chronic pain 09/22/2010   Multiple sources  of pain: radicular pain from lumbar disease and post-operative pain; neck pain from cervical disk disease; left knee pain from trauma.  Plan - will wean off prednisone           Will continue percocet at 1  5/325 norco every 6 hours prn           NSAID - trial of meloxicam 9m g once a day           Increase gabapentin slowly to 600mg  tid and reassess           Executed a pain con  . Chronic venous insufficiency 10/10/2013  . Colitis 10/21/2016  . Collagen vascular disease ([place])   . Deformity of joint 04/23/2013  . Depression  with anxiety 09/21/2010  . Derangement of knee, left 2009   started with fall at work. Has had repair patella and torn meniscus  . DJD (degenerative joint disease) of knee 09/02/2012  . Essential hypertension 07/08/2010   Long standing hypertension medically managed. Currently seeing Dr. Daneen Schick and Dr. Marin Roberts ([facility]) for cardiology.  Meds  HCT only  Overview:  Overview:  Long standing hypertension medically managed. Currently seeing Dr. Daneen Schick and Dr. Bettina Gavia ([facility]) for cardiology.  Meds  HCT only  Last Assessment & Plan:  Formatting of this note may be different from the original. BP Readings  . Fever chills 10/21/2016  . Fibromyalgia 02/27/2017  . High cholesterol   . Hx of appendectomy   . Hypertension   . Hypertensive chronic kidney disease 07/08/2010   Long standing hypertension medically managed. Currently seeing Dr. Daneen Schick and Dr. Marin Roberts ([facility]) for cardiology.  Meds  HCT only  Overview:  Overview:  Long standing hypertension medically managed. Currently seeing Dr. Daneen Schick and Dr. Bettina Gavia ([facility]) for cardiology.  Meds  HCT only  Last Assessment & Plan:  Formatting of this note may be different from the original. BP Readings  . Knee internal derangement 07/08/2010   Left knee: Feb '09 medial and lateral meniscal tears by MRI                  April '09 arthroscopic surgery   . Melanoma ([place]) 11/19/2015  . Migraine headache    hormonal related - less frequent off hormone replacement  . Migraines   . Neuropathy 04/23/2013  . PAT (paroxysmal atrial tachycardia) ([place]) 02/17/2018  . Peripheral cyanosis 09/28/2012   Full evaluation by Dr. Tobie Lords for rheumatology - no evidence of Raynaud's syndrome or disease; no evidence of underlying rheumatologic disease or malignancy. Most probable cause is neurogenic.   . Pseudoarthrosis of lumbar spine 01/02/2011  . Psoriasis 04/23/2013  . Raynaud disease   . Routine health maintenance 08/17/2011   Colonoscopy - no  record in EPIC of colonoscopy  Immunizations - Tdap June '13.    . Salmonella enteritis 10/23/2016  . Sepsis ([place]) 10/23/2016  . Spinal stenosis 08/14/2010   history of cervical laminectomy, lumbar diskectomy with fixation devices and redo surgery.   . Status post lumbar spinal fusion 10/02/2011  . SVT (supraventricular tachycardia) ([place])   . Synovial cyst of lumbar facet joint    lower back - s/p surgery   . Tear of lateral meniscus of left knee 09/30/2012    Past Surgical History:  Procedure Laterality Date  . APPENDECTOMY  1971  . CERVICAL DISCECTOMY  1999   diskectomy with fixation:plate and screws  . COLONOSCOPY    . ESOPHAGOGASTRODUODENOSCOPY  12/2014  . KNEE  ARTHROSCOPY W/ MENISCAL REPAIR  09   left knee  . L4-5 lumbar fusion  September 02, 2010   Dr. Hal Neer:  minimally invasive transforaminal interbody fusion with spacer  . OVARIAN CYST SURGERY  1972  . PATELLAR REEFING  '09   repair of fractured patella after fall  . SPINE SURGERY    . SYNOVIAL CYST EXCISION  2007   lumbar spine  . VENOUS ABLATION     for pain in the groin - [facility]TS did procedure. Has some residual discomfort at the  ablation site.   . WISDOM TOOTH EXTRACTION      Current Medications: Current Meds  Medication Sig  . amitriptyline (ELAVIL) 10 MG tablet Take 20 mg by mouth at bedtime.   Marland Kitchen aspirin EC 81 MG tablet Take 1 tablet (81 mg total) by mouth daily.  . cholecalciferol (VITAMIN D) 1000 units tablet Take 1,000 Units by mouth 2 (two) times daily.   . clonazePAM (KLONOPIN) 1 MG tablet Take 1 tablet (1 mg total) by mouth at bedtime as needed for anxiety.  Marland Kitchen esomeprazole (NEXIUM) 20 MG capsule Take 40 mg by mouth 2 (two) times daily.   . fluticasone (FLONASE) 50 MCG/ACT nasal spray Place 1 spray into both nostrils daily as needed for allergies.   Marland Kitchen gabapentin (NEURONTIN) 100 MG capsule Take 100 mg by mouth daily.  Marland Kitchen Hyoscyamine Sulfate SL (LEVSIN/SL) 0.125 MG SUBL Place 0.125 mg under the tongue 4 (four) times  daily.  Marland Kitchen levothyroxine (SYNTHROID, LEVOTHROID) 75 MCG tablet Take 75 mcg by mouth at bedtime.   . meloxicam (MOBIC) 15 MG tablet Take 15 mg by mouth daily as needed for pain.  . Multiple Vitamins-Minerals (MULTIVITAMIN ADULT PO) Take 1 tablet by mouth daily.   . potassium chloride (K-DUR) 10 MEQ tablet Take 1 tablet in the morning on Monday, Wednesday, and Friday.  . sucralfate (CARAFATE) 1 G tablet Take 1 g by mouth daily.  Marland Kitchen tiZANidine (ZANAFLEX) 2 MG tablet Take 2 mg by mouth 2 (two) times daily as needed. Reported on 04/05/2015  . traMADol (ULTRAM) 50 MG tablet Take 1 tablet (50 mg total) by mouth every 6 (six) hours as needed.  . TURMERIC PO Take 2,000 mg by mouth daily.  Marland Kitchen zinc gluconate 50 MG tablet Take 50 mg by mouth daily.  . [DISCONTINUED] metoprolol succinate (TOPROL-XL) 25 MG 24 hr tablet Take 1 tablet (25 mg total) by mouth 2 (two) times daily.     Allergies:   Epinephrine, Nsaids, Other, Pregabalin, Tolmetin, Prednisone, Tape, Tapentadol, Captopril, Lisinopril, Caine-1 [lidocaine hcl], Celecoxib, Codeine, Lidocaine hcl, Procaine, Sulfa antibiotics, and Sulfasalazine   Social History   Socioeconomic History  . Marital status: Married    Spouse name: Not on file  . Number of children: Not on file  . Years of education: 47  . Highest education level: Not on file  Occupational History  . 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    Employer: [employer]  Tobacco Use  . Smoking status: Never Smoker  . Smokeless tobacco: Never Used  Vaping Use  . Vaping Use: Never used  Substance and Sexual Activity  . Alcohol use: No  . Drug use: No  . Sexual activity: Yes    Partners: Male    Birth control/protection: None  Other Topics Concern  . 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 1 dtr- '72; 1 son '73; 4 grandchildren, 2 step-grands. 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 Also owns a flower shop.   Right handed   Lives at home with spouse    2-3 cups daily of caffeine    Social Determinants of Health   Financial Resource Strain: Not on file  Food Insecurity: Not on file  Transportation Needs: Not on file  Physical Activity: Not on file  Stress: Not on file  Social Connections: Not on file     Family History: The patient's family history includes Aortic aneurysm in her mother; COPD in her mother; Cancer in her father; Diabetes in her brother, father, and mother; Heart attack in her father; Heart disease in her father; Hyperlipidemia in her father; Hypertension in her father and mother; Hypothyroidism in her mother. ROS:   Please see the history of present illness.    All other systems reviewed and are negative.  EKGs/Labs/Other Studies Reviewed:    The following studies were reviewed today:  EKG:  EKG ordered today and personally reviewed.  The ekg ordered today demonstrates sinus rhythm 72 bpm normal EKG  Recent Labs: 02/26/2020 she had a BMP done with a creatinine is 0.90 BUN 11 random glucose 85 TSH normal 4.04. Recent Lipid Panel    Component Value Date/Time   CHOL 189 12/07/2011 1252   TRIG 127.0 12/07/2011 1252   HDL 49.30 12/07/2011 1252   CHOLHDL 4 12/07/2011 1252   VLDL 25.4 12/07/2011 1252   LDLCALC 114 (H) 12/07/2011 1252    Physical Exam:    VS:  BP 116/60 (BP Location: Right Arm, Patient Position: Sitting, Cuff Size: Normal)   Pulse 72   Ht 5\' 4"  (1.626 m)   Wt 208 lb (94.3 kg)   SpO2 96%   BMI 35.70 kg/m     Wt Readings from Last 3 Encounters:  04/14/20 208 lb (94.3 kg)  03/20/19 236 lb (107 kg)  10/24/18 238 lb 1.9 oz (108 kg)     GEN:  Well nourished, well developed in no acute distress HEENT: Normal NECK: No JVD; No carotid bruits LYMPHATICS: No lymphadenopathy CARDIAC: RRR, no murmurs, rubs, gallops RESPIRATORY:  Clear to auscultation without rales, wheezing or rhonchi  ABDOMEN: Soft,  non-tender, non-distended MUSCULOSKELETAL:  No edema; No deformity  SKIN: Warm and dry NEUROLOGIC:  Alert and oriented x 3 PSYCHIATRIC:  Normal affect    Signed, Shirlee More, MD  04/14/2020 4:49 PM    [place]

## 2020-04-14 NOTE — Patient Instructions (Signed)

## 2020-04-15 NOTE — Addendum Note (Signed)
Addended by: Resa Miner I on: 04/15/2020 09:47 AM   Modules accepted: Orders

## 2020-04-20 ENCOUNTER — Other Ambulatory Visit: Payer: Self-pay

## 2020-04-20 ENCOUNTER — Encounter: Payer: Self-pay | Admitting: Podiatry

## 2020-04-20 ENCOUNTER — Ambulatory Visit (INDEPENDENT_AMBULATORY_CARE_PROVIDER_SITE_OTHER): Payer: Medicare Other | Admitting: Podiatry

## 2020-04-20 DIAGNOSIS — M79676 Pain in unspecified toe(s): Secondary | ICD-10-CM

## 2020-04-20 DIAGNOSIS — L603 Nail dystrophy: Secondary | ICD-10-CM

## 2020-04-20 DIAGNOSIS — B351 Tinea unguium: Secondary | ICD-10-CM | POA: Diagnosis not present

## 2020-04-20 MED ORDER — CICLOPIROX 8 % EX SOLN: Freq: Every day | CUTANEOUS | 11 refills | Status: AC

## 2020-04-20 NOTE — Progress Notes (Signed)
She presents today after having not seen her since August of last year with a query of whether to remove her great toenails or not.  Objective: Vital signs are stable she is alert and oriented x3 pulses are palpable bilateral.  She has severe nail dystrophy possible onychomycosis and psoriatic nail disease to the hallux bilaterally.  Also to the lesser toenails 2 3 and 4 of the left foot in particular.  Second digit right foot is also involved.  Assessment: Painful hallux nails.  Plan: We will debride them all today for her 1 through 5 bilaterally and start her back on her ciclopirox.  Follow-up with her in the mid summer to have these removed if necessary.

## 2020-08-03 DIAGNOSIS — R61 Generalized hyperhidrosis: Secondary | ICD-10-CM | POA: Diagnosis not present

## 2020-08-03 DIAGNOSIS — E039 Hypothyroidism, unspecified: Secondary | ICD-10-CM | POA: Diagnosis not present

## 2020-08-03 DIAGNOSIS — Z92241 Personal history of systemic steroid therapy: Secondary | ICD-10-CM | POA: Diagnosis not present

## 2020-08-03 DIAGNOSIS — M8588 Other specified disorders of bone density and structure, other site: Secondary | ICD-10-CM | POA: Diagnosis not present

## 2020-08-26 DIAGNOSIS — I129 Hypertensive chronic kidney disease with stage 1 through stage 4 chronic kidney disease, or unspecified chronic kidney disease: Secondary | ICD-10-CM | POA: Diagnosis not present

## 2020-08-26 DIAGNOSIS — E039 Hypothyroidism, unspecified: Secondary | ICD-10-CM | POA: Diagnosis not present

## 2020-08-26 DIAGNOSIS — R591 Generalized enlarged lymph nodes: Secondary | ICD-10-CM | POA: Diagnosis not present

## 2020-08-26 DIAGNOSIS — F419 Anxiety disorder, unspecified: Secondary | ICD-10-CM | POA: Diagnosis not present

## 2020-08-26 DIAGNOSIS — N182 Chronic kidney disease, stage 2 (mild): Secondary | ICD-10-CM | POA: Diagnosis not present

## 2020-09-27 DIAGNOSIS — D225 Melanocytic nevi of trunk: Secondary | ICD-10-CM | POA: Diagnosis not present

## 2020-09-27 DIAGNOSIS — L812 Freckles: Secondary | ICD-10-CM | POA: Diagnosis not present

## 2020-09-27 DIAGNOSIS — L821 Other seborrheic keratosis: Secondary | ICD-10-CM | POA: Diagnosis not present

## 2020-09-27 DIAGNOSIS — Z85820 Personal history of malignant melanoma of skin: Secondary | ICD-10-CM | POA: Diagnosis not present

## 2020-09-27 DIAGNOSIS — D1801 Hemangioma of skin and subcutaneous tissue: Secondary | ICD-10-CM | POA: Diagnosis not present

## 2020-10-05 DIAGNOSIS — E039 Hypothyroidism, unspecified: Secondary | ICD-10-CM | POA: Diagnosis not present

## 2020-10-14 DIAGNOSIS — K625 Hemorrhage of anus and rectum: Secondary | ICD-10-CM | POA: Diagnosis not present

## 2020-10-14 DIAGNOSIS — K648 Other hemorrhoids: Secondary | ICD-10-CM | POA: Diagnosis not present

## 2020-10-14 DIAGNOSIS — R1030 Lower abdominal pain, unspecified: Secondary | ICD-10-CM | POA: Diagnosis not present

## 2020-10-14 DIAGNOSIS — R140 Abdominal distension (gaseous): Secondary | ICD-10-CM | POA: Diagnosis not present

## 2020-10-14 DIAGNOSIS — R040 Epistaxis: Secondary | ICD-10-CM | POA: Diagnosis not present

## 2020-10-14 DIAGNOSIS — K219 Gastro-esophageal reflux disease without esophagitis: Secondary | ICD-10-CM | POA: Diagnosis not present

## 2020-10-18 DIAGNOSIS — N949 Unspecified condition associated with female genital organs and menstrual cycle: Secondary | ICD-10-CM | POA: Diagnosis not present

## 2020-10-18 DIAGNOSIS — R040 Epistaxis: Secondary | ICD-10-CM | POA: Diagnosis not present

## 2020-10-18 DIAGNOSIS — F419 Anxiety disorder, unspecified: Secondary | ICD-10-CM | POA: Diagnosis not present

## 2020-10-27 DIAGNOSIS — L723 Sebaceous cyst: Secondary | ICD-10-CM | POA: Diagnosis not present

## 2020-10-27 DIAGNOSIS — K625 Hemorrhage of anus and rectum: Secondary | ICD-10-CM | POA: Diagnosis not present

## 2020-11-22 DIAGNOSIS — M4326 Fusion of spine, lumbar region: Secondary | ICD-10-CM | POA: Diagnosis not present

## 2020-11-22 DIAGNOSIS — Z6836 Body mass index (BMI) 36.0-36.9, adult: Secondary | ICD-10-CM | POA: Diagnosis not present

## 2020-11-22 DIAGNOSIS — M545 Low back pain, unspecified: Secondary | ICD-10-CM | POA: Diagnosis not present

## 2020-11-22 DIAGNOSIS — M47896 Other spondylosis, lumbar region: Secondary | ICD-10-CM | POA: Diagnosis not present

## 2020-11-22 DIAGNOSIS — G894 Chronic pain syndrome: Secondary | ICD-10-CM | POA: Diagnosis not present

## 2020-11-24 ENCOUNTER — Other Ambulatory Visit (HOSPITAL_COMMUNITY): Payer: Self-pay | Admitting: Internal Medicine

## 2020-11-24 ENCOUNTER — Other Ambulatory Visit: Payer: Self-pay | Admitting: Internal Medicine

## 2020-11-24 ENCOUNTER — Other Ambulatory Visit: Payer: Self-pay

## 2020-11-24 ENCOUNTER — Ambulatory Visit (HOSPITAL_COMMUNITY)
Admission: RE | Admit: 2020-11-24 | Discharge: 2020-11-24 | Disposition: A | Discharge status: Home / Self Care | Payer: Medicare Other | Source: Ambulatory Visit | Attending: Internal Medicine | Admitting: Internal Medicine

## 2020-11-24 DIAGNOSIS — R109 Unspecified abdominal pain: Secondary | ICD-10-CM

## 2020-11-24 DIAGNOSIS — M545 Low back pain, unspecified: Secondary | ICD-10-CM | POA: Diagnosis not present

## 2020-11-24 DIAGNOSIS — K760 Fatty (change of) liver, not elsewhere classified: Secondary | ICD-10-CM | POA: Diagnosis not present

## 2020-11-24 DIAGNOSIS — N200 Calculus of kidney: Secondary | ICD-10-CM | POA: Diagnosis not present

## 2020-11-24 DIAGNOSIS — Z981 Arthrodesis status: Secondary | ICD-10-CM | POA: Diagnosis not present

## 2020-11-24 DIAGNOSIS — R8290 Unspecified abnormal findings in urine: Secondary | ICD-10-CM | POA: Diagnosis not present

## 2020-11-24 DIAGNOSIS — N6320 Unspecified lump in the left breast, unspecified quadrant: Secondary | ICD-10-CM | POA: Diagnosis not present

## 2020-11-24 DIAGNOSIS — R928 Other abnormal and inconclusive findings on diagnostic imaging of breast: Secondary | ICD-10-CM | POA: Diagnosis not present

## 2020-11-24 DIAGNOSIS — I700 Atherosclerosis of aorta: Secondary | ICD-10-CM | POA: Diagnosis not present

## 2020-12-09 DIAGNOSIS — R109 Unspecified abdominal pain: Secondary | ICD-10-CM | POA: Diagnosis not present

## 2020-12-14 DIAGNOSIS — M5136 Other intervertebral disc degeneration, lumbar region: Secondary | ICD-10-CM | POA: Diagnosis not present

## 2020-12-14 DIAGNOSIS — Z981 Arthrodesis status: Secondary | ICD-10-CM | POA: Diagnosis not present

## 2020-12-15 DIAGNOSIS — M4726 Other spondylosis with radiculopathy, lumbar region: Secondary | ICD-10-CM | POA: Diagnosis not present

## 2020-12-15 DIAGNOSIS — M4326 Fusion of spine, lumbar region: Secondary | ICD-10-CM | POA: Diagnosis not present

## 2020-12-15 DIAGNOSIS — M47896 Other spondylosis, lumbar region: Secondary | ICD-10-CM | POA: Diagnosis not present

## 2020-12-15 DIAGNOSIS — Z23 Encounter for immunization: Secondary | ICD-10-CM | POA: Diagnosis not present

## 2020-12-15 DIAGNOSIS — Z6836 Body mass index (BMI) 36.0-36.9, adult: Secondary | ICD-10-CM | POA: Diagnosis not present

## 2020-12-16 ENCOUNTER — Telehealth: Payer: Self-pay

## 2020-12-16 NOTE — Telephone Encounter (Signed)
   Name: [name]  DOB: 1951/02/16  MRN: [id number]   Primary Cardiologist: Dr. Shirlee More  Chart reviewed as part of pre-operative protocol coverage. Patient was contacted 12/16/2020 in reference to pre-operative risk assessment for pending surgery as outlined below.  Henya Aguallo was last seen 04/2020 by Dr. Bettina Gavia. Chart reviewed. 2D echo 2020 with normal LVEF. Based on historical records, revised cardiac risk index is 0.4% indicating low CV risk. I reached out to patient for update on how she is doing. The patient affirms she has been doing well without any new cardiac symptoms. She has been generally able to walk the dog and work in the yard without angina or dyspnea. No recent issues with palpitations reported. Therefore, based on ACC/AHA guidelines, the patient would be at acceptable risk for the planned procedure without further cardiovascular testing. The patient was advised that if she develops new symptoms prior to surgery to contact our office to arrange for a follow-up visit, and she verbalized understanding.  She does not have a history of CAD, MI, PCI, stroke or mini stroke therefore OK to hold ASA 7 days prior to procedure as requested. The patient clarifies that she actually has already self-discontinued ASA due to history of nosebleeds.  I will route this recommendation to the requesting party via Epic fax function and remove from pre-op pool. Please call with questions.  Charlie Pitter, PA-C 12/16/2020, 12:58 PM

## 2020-12-16 NOTE — Telephone Encounter (Signed)
   [facility] Pre-operative Risk Assessment    Request for surgical clearance:  What type of surgery is being performed? Revision L1-S1 lami/PSF and TLIF   When is this surgery scheduled? TBD   What type of clearance is required (medical clearance vs. Pharmacy clearance to hold med vs. Both)? Both  Are there any medications that need to be held prior to surgery and how long?Aspirin 7 days prior    Practice name and name of physician performing surgery? Dr. Rennis Harding at [facility]    What is your office phone number: [phone]    7.   What is your office fax number: [phone]  8.   Anesthesia type (None, local, MAC, general) ? General   Keynan Heffern M Emersyn Wyss 12/16/2020, 10:44 AM  _________________________________________________________________   (provider comments below)

## 2020-12-22 DIAGNOSIS — M4316 Spondylolisthesis, lumbar region: Secondary | ICD-10-CM | POA: Diagnosis not present

## 2020-12-22 DIAGNOSIS — Z981 Arthrodesis status: Secondary | ICD-10-CM | POA: Diagnosis not present

## 2020-12-22 DIAGNOSIS — M5136 Other intervertebral disc degeneration, lumbar region: Secondary | ICD-10-CM | POA: Diagnosis not present

## 2020-12-22 DIAGNOSIS — M4326 Fusion of spine, lumbar region: Secondary | ICD-10-CM | POA: Diagnosis not present

## 2020-12-22 DIAGNOSIS — M5186 Other intervertebral disc disorders, lumbar region: Secondary | ICD-10-CM | POA: Diagnosis not present

## 2021-01-19 DIAGNOSIS — M4326 Fusion of spine, lumbar region: Secondary | ICD-10-CM | POA: Diagnosis not present

## 2021-01-19 DIAGNOSIS — M4726 Other spondylosis with radiculopathy, lumbar region: Secondary | ICD-10-CM | POA: Diagnosis not present

## 2021-01-19 DIAGNOSIS — M961 Postlaminectomy syndrome, not elsewhere classified: Secondary | ICD-10-CM | POA: Diagnosis not present

## 2021-01-19 DIAGNOSIS — M545 Low back pain, unspecified: Secondary | ICD-10-CM | POA: Diagnosis not present

## 2021-01-27 DIAGNOSIS — Z23 Encounter for immunization: Secondary | ICD-10-CM | POA: Diagnosis not present

## 2021-02-14 ENCOUNTER — Other Ambulatory Visit: Payer: Self-pay | Admitting: Cardiology

## 2021-02-14 DIAGNOSIS — N181 Chronic kidney disease, stage 1: Secondary | ICD-10-CM

## 2021-02-14 DIAGNOSIS — I129 Hypertensive chronic kidney disease with stage 1 through stage 4 chronic kidney disease, or unspecified chronic kidney disease: Secondary | ICD-10-CM

## 2021-02-21 DIAGNOSIS — Z961 Presence of intraocular lens: Secondary | ICD-10-CM | POA: Diagnosis not present

## 2021-02-21 DIAGNOSIS — H5213 Myopia, bilateral: Secondary | ICD-10-CM | POA: Diagnosis not present

## 2021-02-21 DIAGNOSIS — H04123 Dry eye syndrome of bilateral lacrimal glands: Secondary | ICD-10-CM | POA: Diagnosis not present

## 2021-03-01 DIAGNOSIS — R1314 Dysphagia, pharyngoesophageal phase: Secondary | ICD-10-CM | POA: Diagnosis not present

## 2021-03-01 DIAGNOSIS — R109 Unspecified abdominal pain: Secondary | ICD-10-CM | POA: Diagnosis not present

## 2021-03-01 DIAGNOSIS — Z1389 Encounter for screening for other disorder: Secondary | ICD-10-CM | POA: Diagnosis not present

## 2021-03-01 DIAGNOSIS — K219 Gastro-esophageal reflux disease without esophagitis: Secondary | ICD-10-CM | POA: Diagnosis not present

## 2021-03-01 DIAGNOSIS — F419 Anxiety disorder, unspecified: Secondary | ICD-10-CM | POA: Diagnosis not present

## 2021-03-01 DIAGNOSIS — N182 Chronic kidney disease, stage 2 (mild): Secondary | ICD-10-CM | POA: Diagnosis not present

## 2021-03-01 DIAGNOSIS — Z0000 Encounter for general adult medical examination without abnormal findings: Secondary | ICD-10-CM | POA: Diagnosis not present

## 2021-03-01 DIAGNOSIS — I129 Hypertensive chronic kidney disease with stage 1 through stage 4 chronic kidney disease, or unspecified chronic kidney disease: Secondary | ICD-10-CM | POA: Diagnosis not present

## 2021-03-01 DIAGNOSIS — K589 Irritable bowel syndrome without diarrhea: Secondary | ICD-10-CM | POA: Diagnosis not present

## 2021-03-01 DIAGNOSIS — M810 Age-related osteoporosis without current pathological fracture: Secondary | ICD-10-CM | POA: Diagnosis not present

## 2021-03-01 DIAGNOSIS — M797 Fibromyalgia: Secondary | ICD-10-CM | POA: Diagnosis not present

## 2021-03-01 DIAGNOSIS — E039 Hypothyroidism, unspecified: Secondary | ICD-10-CM | POA: Diagnosis not present

## 2021-04-10 ENCOUNTER — Ambulatory Visit
Admission: EM | Admit: 2021-04-10 | Discharge: 2021-04-10 | Disposition: A | Discharge status: Home / Self Care | Payer: Medicare Other | Attending: Internal Medicine | Admitting: Internal Medicine

## 2021-04-10 ENCOUNTER — Encounter: Payer: Self-pay | Admitting: Emergency Medicine

## 2021-04-10 ENCOUNTER — Other Ambulatory Visit: Payer: Self-pay

## 2021-04-10 DIAGNOSIS — J069 Acute upper respiratory infection, unspecified: Secondary | ICD-10-CM

## 2021-04-10 NOTE — ED Triage Notes (Signed)
Patient c/o sinus pressure and congestion, nasal drainage, body aches, diarrhea x 2 days.  Patient denies any OTC cold meds.

## 2021-04-10 NOTE — ED Provider Notes (Signed)
[facility]    CSN: [id number] Arrival date & time: 04/10/21  1341      History   Chief Complaint Chief Complaint  Patient presents with   Facial Pain    HPI [name] is a 71 y.o. female.   Patient presents with sinus pressure, nasal congestion, nasal drainage, mild cough, body aches, diarrhea that has been present for 2 days.  Denies any known fevers or sick contacts.  Denies chest pain, shortness of breath, sore throat, ear pain, nausea, vomiting, diarrhea, abdominal pain.  Patient has not taken any medications to help alleviate symptoms.    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 Bettina Gavia per patient report.  Overview:  CAD risk score is low at 3%   Chondromalacia patellae 12/02/2012   Chronic back pain    Chronic kidney disease    Chronic knee pain    Chronic pain 09/22/2010   Multiple sources of pain: radicular pain from lumbar disease and post-operative pain; neck pain from cervical disk disease; left knee pain from trauma.  Plan - will wean off prednisone           Will continue percocet at 1  5/325 norco every 6 hours prn           NSAID - trial of meloxicam 61m g once a day           Increase gabapentin slowly to 600mg  tid and reassess           Executed a pain con   Chronic venous insufficiency 10/10/2013   Colitis 10/21/2016   Collagen vascular disease ([place])    Deformity of joint 04/23/2013   Depression with anxiety 09/21/2010   Derangement of knee, left 2009   started with fall at work.  Has had repair patella and torn meniscus   DJD (degenerative joint disease) of knee 09/02/2012   Essential hypertension 07/08/2010   Long standing hypertension medically managed. Currently seeing Dr. Daneen Schick and Dr. Marin Roberts ([facility]) for cardiology.  Meds  HCT only  Overview:  Overview:  Long standing hypertension medically managed. Currently seeing Dr. Daneen Schick and Dr. Bettina Gavia ([facility]) for cardiology.  Meds  HCT only  Last Assessment & Plan:  Formatting of this note may be different from the original. BP Readings   Fever chills 10/21/2016   Fibromyalgia 02/27/2017   High cholesterol    Hx of appendectomy    Hypertension    Hypertensive chronic kidney disease 07/08/2010   Long standing hypertension medically managed. Currently seeing Dr. Daneen Schick and Dr. Marin Roberts ([facility]) for cardiology.  Meds  HCT only  Overview:  Overview:  Long standing hypertension medically managed. Currently seeing Dr. Daneen Schick and Dr. Bettina Gavia ([facility]) for cardiology.  Meds  HCT only  Last Assessment & Plan:  Formatting of this note may be different from the original. BP Readings   Knee internal derangement 07/08/2010   Left knee: Feb '09 medial and lateral meniscal tears by MRI  April '09 arthroscopic surgery    Melanoma ([place]) 11/19/2015   Migraine headache    hormonal related - less frequent off hormone replacement   Migraines    Neuropathy 04/23/2013   PAT (paroxysmal atrial tachycardia) ([place]) 02/17/2018   Peripheral cyanosis 09/28/2012   Full evaluation by Dr. Tobie Lords for rheumatology - no evidence of Raynaud's syndrome or disease; no evidence of underlying rheumatologic disease or malignancy. Most probable cause is neurogenic.    Pseudoarthrosis of lumbar spine 01/02/2011   Psoriasis 04/23/2013   Raynaud disease    Routine health maintenance 08/17/2011   Colonoscopy - no record in EPIC of colonoscopy  Immunizations - Tdap June '13.     Salmonella enteritis 10/23/2016   Sepsis  ([place]) 10/23/2016   Spinal stenosis 08/14/2010   history of cervical laminectomy, lumbar diskectomy with fixation devices and redo surgery.    Status post lumbar spinal fusion 10/02/2011   SVT (supraventricular tachycardia) (HCC)    Synovial cyst of lumbar facet joint    lower back - s/p surgery    Tear of lateral meniscus of left knee 09/30/2012    Patient Active Problem List   Diagnosis Date Noted   Synovial cyst of lumbar facet joint    Raynaud disease    Migraines    Migraine headache    Hypertension    Hx of appendectomy    High cholesterol    Collagen vascular disease (HCC)    Chronic knee pain    Chronic kidney disease    Allergy    Arthritis    Chronic right-sided low back pain with right-sided sciatica 12/12/2018   Left-sided trigeminal neuralgia 06/03/2018   Trigeminal neuralgia of left side of face 04/30/2018   APC (atrial premature contractions) 02/17/2018   PAT (paroxysmal atrial tachycardia) ([place]) 02/17/2018   Palpitations 12/24/2017   Angular cheilitis 05/10/2017   Dysphonia 05/10/2017   Laryngopharyngeal reflux (LPR) 05/10/2017   Mucous retention cyst of maxillary sinus 05/10/2017   Recurrent epistaxis 05/10/2017   Upper airway cough syndrome 04/26/2017   Fibromyalgia 02/27/2017   Sepsis ([place]) 10/23/2016   Salmonella enteritis 10/23/2016   Colitis 10/21/2016   CAP (community acquired pneumonia) 10/21/2016   Fever chills 10/21/2016   Melanoma ([place]) 11/19/2015   SVT (supraventricular tachycardia) ([place]) 05/20/2015   Chronic venous insufficiency 10/10/2013   Neuropathy 04/23/2013   Psoriasis 04/23/2013   Deformity of joint 04/23/2013   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   Chondromalacia patellae 12/02/2012   Tear of lateral meniscus of left knee 09/30/2012   Peripheral cyanosis 09/28/2012   DJD (degenerative joint disease) of knee 09/02/2012   Status post lumbar spinal fusion 10/02/2011   Routine health maintenance 08/17/2011   Anemia, iron  deficiency 01/22/2011   Chronic back pain 01/02/2011   Pseudoarthrosis of lumbar spine 01/02/2011   Chronic pain 09/22/2010   Depression with anxiety 09/21/2010   Spinal stenosis 08/14/2010   Hypertensive chronic kidney disease 07/08/2010   Migraine 07/08/2010   Knee internal derangement 07/08/2010   Chest pain in adult 07/08/2010   Essential hypertension 07/08/2010   Derangement of knee, left 2009    Past Surgical History:  Procedure Laterality Date   APPENDECTOMY  1971   CERVICAL DISCECTOMY  1999   diskectomy with fixation:plate and screws   COLONOSCOPY     ESOPHAGOGASTRODUODENOSCOPY  12/2014   KNEE ARTHROSCOPY W/ MENISCAL REPAIR  09   left knee   L4-5 lumbar fusion  September 02, 2010   Dr. Hal Neer:  minimally invasive transforaminal  interbody fusion with spacer   OVARIAN CYST SURGERY  1972   PATELLAR REEFING  '09   repair of fractured patella after fall   SPINE SURGERY     SYNOVIAL CYST EXCISION  2007   lumbar spine   VENOUS ABLATION     for pain in the groin - VVTS did procedure. Has some residual discomfort at the  ablation site.    WISDOM TOOTH EXTRACTION      OB History   No obstetric history on file.      Home Medications    Prior to Admission medications   Medication Sig Start Date End Date Taking? Authorizing Provider  amitriptyline (ELAVIL) 10 MG tablet Take 20 mg by mouth at bedtime.    Yes [provider]  aspirin EC 81 MG tablet Take 1 tablet (81 mg total) by mouth daily. 10/23/18  Yes Richardo Priest, MD  B Complex Vitamins (VITAMIN B COMPLEX PO) Take 1 tablet by mouth daily.   Yes [provider]  cholecalciferol (VITAMIN D) 1000 units tablet Take 1,000 Units by mouth 2 (two) times daily.    Yes [provider]  ciclopirox (PENLAC) 8 % solution Apply topically at bedtime. Apply to nail/surrounding skin and daily over previous coat. Every 7 days remove with alcohol and continue. 04/20/20  Yes Hyatt, Max T, DPM  clonazePAM  (KLONOPIN) 1 MG tablet Take 1 tablet (1 mg total) by mouth at bedtime as needed for anxiety.   Yes Norins, Heinz Knuckles, MD  esomeprazole (NEXIUM) 20 MG capsule Take 40 mg by mouth 2 (two) times daily.    Yes [provider]  fluticasone (FLONASE) 50 MCG/ACT nasal spray Place 1 spray into both nostrils daily as needed for allergies.    Yes [provider]  furosemide (LASIX) 20 MG tablet Take 1 tablet in the morning on Monday, Wednesday, and Friday. 10/24/18  Yes Richardo Priest, MD  gabapentin (NEURONTIN) 100 MG capsule Take 100 mg by mouth daily.   Yes [provider]  Hyoscyamine Sulfate SL (LEVSIN/SL) 0.125 MG SUBL Place 0.125 mg under the tongue 4 (four) times daily. 04/12/16  Yes English, Colletta [place] D, PA  levothyroxine (SYNTHROID, LEVOTHROID) 75 MCG tablet Take 75 mcg by mouth at bedtime.    Yes [provider]  meloxicam (MOBIC) 15 MG tablet Take 15 mg by mouth daily as needed for pain.   Yes [provider]  metoprolol succinate (TOPROL-XL) 25 MG 24 hr tablet TAKE 1 TABLET BY MOUTH 2 TIMES DAILY. 02/14/21  Yes Richardo Priest, MD  Multiple Vitamins-Minerals (MULTIVITAMIN ADULT PO) Take 1 tablet by mouth daily.    Yes [provider]  potassium chloride (K-DUR) 10 MEQ tablet Take 1 tablet in the morning on Monday, Wednesday, and Friday. 10/24/18  Yes Richardo Priest, MD  sucralfate (CARAFATE) 1 G tablet Take 1 g by mouth daily. 09/03/14  Yes [provider]  tiZANidine (ZANAFLEX) 2 MG tablet Take 2 mg by mouth 2 (two) times daily as needed. Reported on 04/05/2015   Yes [provider]  traMADol (ULTRAM) 50 MG tablet Take 1 tablet (50 mg total) by mouth every 6 (six) hours as needed. 03/28/17  Yes Doristine Devoid, PA-C  TURMERIC PO Take 2,000 mg by mouth daily.   Yes [provider]  zinc gluconate 50 MG tablet Take 50 mg by mouth daily.   Yes [provider]    Family History Family History  Problem  Relation  Age of Onset   Diabetes Mother    COPD Mother    Aortic aneurysm Mother    Hypertension Mother    Hypothyroidism Mother    Cancer Father        lung   Hyperlipidemia Father    Hypertension Father    Heart disease Father    Diabetes Father    Heart attack Father    Diabetes Brother     Social History Social History   Tobacco Use   Smoking status: Never   Smokeless tobacco: Never  Vaping Use   Vaping Use: Never used  Substance Use Topics   Alcohol use: No   Drug use: No     Allergies   Epinephrine, Nsaids, Other, Pregabalin, Tolmetin, Ciprofloxacin, Parathyroid hormone (recomb), Prednisone, Tape, Tapentadol, Captopril, Lisinopril, Caine-1 [lidocaine hcl], Celecoxib, Codeine, Lidocaine hcl, Procaine, Sulfa antibiotics, and Sulfasalazine   Review of Systems Review of Systems Per HPI  Physical Exam Triage Vital Signs ED Triage Vitals  Enc Vitals Group     BP 04/10/21 1440 (!) 146/83     Pulse Rate 04/10/21 1440 67     Resp 04/10/21 1440 20     Temp 04/10/21 1440 97.8 F (36.6 C)     Temp Source 04/10/21 1440 Oral     SpO2 04/10/21 1440 96 %     Weight 04/10/21 1441 147 lb (66.7 kg)     Height 04/10/21 1441 5\' 5"  (1.651 m)     Head Circumference --      Peak Flow --      Pain Score 04/10/21 1441 7     Pain Loc --      Pain Edu? --      Excl. in [facility]? --    No data found.  Updated Vital Signs BP (!) 146/83 (BP Location: Left Arm)    Pulse 67    Temp 97.8 F (36.6 C) (Oral)    Resp 20    Ht 5\' 5"  (1.651 m)    Wt 147 lb (66.7 kg)    SpO2 96%    BMI 24.46 kg/m   Visual Acuity Right Eye Distance:   Left Eye Distance:   Bilateral Distance:    Right Eye Near:   Left Eye Near:    Bilateral Near:     Physical Exam Constitutional:      General: She is not in acute distress.    Appearance: Normal appearance. She is not toxic-appearing or diaphoretic.  HENT:     Head: Normocephalic and atraumatic.     Right Ear: Tympanic membrane and ear canal  normal.     Left Ear: Tympanic membrane and ear canal normal.     Nose: Congestion present.     Mouth/Throat:     Mouth: Mucous membranes are moist.     Pharynx: No posterior oropharyngeal erythema.  Eyes:     Extraocular Movements: Extraocular movements intact.     Conjunctiva/sclera: Conjunctivae normal.     Pupils: Pupils are equal, round, and reactive to light.  Cardiovascular:     Rate and Rhythm: Normal rate and regular rhythm.     Pulses: Normal pulses.     Heart sounds: Normal heart sounds.  Pulmonary:     Effort: Pulmonary effort is normal. No respiratory distress.     Breath sounds: No stridor. No wheezing, rhonchi or rales.  Abdominal:     General: Abdomen is flat. Bowel sounds are normal.     Palpations: Abdomen is soft.  Musculoskeletal:        General: Normal range of motion.     Cervical back: Normal range of motion.  Skin:    General: Skin is warm and dry.  Neurological:     General: No focal deficit present.     Mental Status: She is alert and oriented to person, place, and time. Mental status is at baseline.  Psychiatric:        Mood and Affect: Mood normal.        Behavior: Behavior normal.     UC Treatments / Results  Labs (all labs ordered are listed, but only abnormal results are displayed) Labs Reviewed  COVID-19, FLU A+B NAA    EKG   Radiology No results found.  Procedures Procedures (including critical care time)  Medications Ordered in UC Medications - No data to display  Initial Impression / Assessment and Plan / UC Course  I have reviewed the triage vital signs and the nursing notes.  Pertinent labs & imaging results that were available during my care of the patient were reviewed by me and considered in my medical decision making (see chart for details).     Patient presents with symptoms likely from a viral upper respiratory infection. Differential includes bacterial pneumonia, sinusitis, allergic rhinitis, COVID-19, flu. Do not  suspect underlying cardiopulmonary process. Symptoms seem unlikely related to ACS, CHF or COPD exacerbations, pneumonia, pneumothorax. Patient is nontoxic appearing and not in need of emergent medical intervention.  Recommended symptom control with over the counter medications are safe with high blood pressure.  Discussed these with patient.  Return if symptoms fail to improve in 1-2 weeks or you develop shortness of breath, chest pain, severe headache. Patient states understanding and is agreeable.  Discharged with PCP followup.  Final Clinical Impressions(s) / UC Diagnoses   Final diagnoses:  Viral upper respiratory tract infection with cough     Discharge Instructions      It appears that you have a viral upper respiratory infection that should resolve on its own in the next few days.  Recommend over-the-counter symptomatic treatment.  COVID-19 and flu test is pending.    ED Prescriptions   None    PDMP not reviewed this encounter.   Teodora Medici, [place] 04/10/21 1531

## 2021-04-10 NOTE — Discharge Instructions (Signed)
It appears that you have a viral upper respiratory infection that should resolve on its own in the next few days.  Recommend over-the-counter symptomatic treatment.  COVID-19 and flu test is pending.

## 2021-04-11 LAB — COVID-19, FLU A+B NAA
Influenza A, NAA: NOT DETECTED
Influenza B, NAA: NOT DETECTED
SARS-CoV-2, NAA: NOT DETECTED

## 2021-04-19 DIAGNOSIS — M85852 Other specified disorders of bone density and structure, left thigh: Secondary | ICD-10-CM | POA: Diagnosis not present

## 2021-04-19 DIAGNOSIS — M85851 Other specified disorders of bone density and structure, right thigh: Secondary | ICD-10-CM | POA: Diagnosis not present

## 2021-05-11 DIAGNOSIS — K589 Irritable bowel syndrome without diarrhea: Secondary | ICD-10-CM | POA: Diagnosis not present

## 2021-05-11 DIAGNOSIS — K219 Gastro-esophageal reflux disease without esophagitis: Secondary | ICD-10-CM | POA: Diagnosis not present

## 2021-06-26 ENCOUNTER — Ambulatory Visit
Admission: EM | Admit: 2021-06-26 | Discharge: 2021-06-26 | Discharge status: Against Medical Advice | Payer: Medicare Other

## 2021-06-29 DIAGNOSIS — M5412 Radiculopathy, cervical region: Secondary | ICD-10-CM | POA: Diagnosis not present

## 2021-06-29 DIAGNOSIS — M47812 Spondylosis without myelopathy or radiculopathy, cervical region: Secondary | ICD-10-CM | POA: Diagnosis not present

## 2021-06-29 DIAGNOSIS — M549 Dorsalgia, unspecified: Secondary | ICD-10-CM | POA: Diagnosis not present

## 2021-06-29 DIAGNOSIS — M4322 Fusion of spine, cervical region: Secondary | ICD-10-CM | POA: Diagnosis not present

## 2021-06-29 DIAGNOSIS — Z6836 Body mass index (BMI) 36.0-36.9, adult: Secondary | ICD-10-CM | POA: Diagnosis not present

## 2021-07-11 DIAGNOSIS — M4802 Spinal stenosis, cervical region: Secondary | ICD-10-CM | POA: Diagnosis not present

## 2021-07-11 DIAGNOSIS — M5412 Radiculopathy, cervical region: Secondary | ICD-10-CM | POA: Diagnosis not present

## 2021-07-11 DIAGNOSIS — M5021 Other cervical disc displacement,  high cervical region: Secondary | ICD-10-CM | POA: Diagnosis not present

## 2021-07-11 DIAGNOSIS — Z981 Arthrodesis status: Secondary | ICD-10-CM | POA: Diagnosis not present

## 2021-07-11 DIAGNOSIS — M5031 Other cervical disc degeneration,  high cervical region: Secondary | ICD-10-CM | POA: Diagnosis not present

## 2021-07-28 DIAGNOSIS — Z6836 Body mass index (BMI) 36.0-36.9, adult: Secondary | ICD-10-CM | POA: Diagnosis not present

## 2021-07-28 DIAGNOSIS — M4322 Fusion of spine, cervical region: Secondary | ICD-10-CM | POA: Diagnosis not present

## 2021-07-28 DIAGNOSIS — M47812 Spondylosis without myelopathy or radiculopathy, cervical region: Secondary | ICD-10-CM | POA: Diagnosis not present

## 2021-07-28 DIAGNOSIS — M5412 Radiculopathy, cervical region: Secondary | ICD-10-CM | POA: Diagnosis not present

## 2021-08-04 DIAGNOSIS — Z92241 Personal history of systemic steroid therapy: Secondary | ICD-10-CM | POA: Diagnosis not present

## 2021-08-04 DIAGNOSIS — M8588 Other specified disorders of bone density and structure, other site: Secondary | ICD-10-CM | POA: Diagnosis not present

## 2021-08-04 DIAGNOSIS — E039 Hypothyroidism, unspecified: Secondary | ICD-10-CM | POA: Diagnosis not present

## 2021-08-17 DIAGNOSIS — Z03818 Encounter for observation for suspected exposure to other biological agents ruled out: Secondary | ICD-10-CM | POA: Diagnosis not present

## 2021-08-17 DIAGNOSIS — H6120 Impacted cerumen, unspecified ear: Secondary | ICD-10-CM | POA: Diagnosis not present

## 2021-08-17 DIAGNOSIS — E039 Hypothyroidism, unspecified: Secondary | ICD-10-CM | POA: Diagnosis not present

## 2021-08-17 DIAGNOSIS — J069 Acute upper respiratory infection, unspecified: Secondary | ICD-10-CM | POA: Diagnosis not present

## 2021-09-22 DIAGNOSIS — M4322 Fusion of spine, cervical region: Secondary | ICD-10-CM | POA: Diagnosis not present

## 2021-09-22 DIAGNOSIS — M542 Cervicalgia: Secondary | ICD-10-CM | POA: Diagnosis not present

## 2021-09-22 DIAGNOSIS — M4326 Fusion of spine, lumbar region: Secondary | ICD-10-CM | POA: Diagnosis not present

## 2021-09-22 DIAGNOSIS — T84296A Other mechanical complication of internal fixation device of vertebrae, initial encounter: Secondary | ICD-10-CM | POA: Diagnosis not present

## 2021-10-03 ENCOUNTER — Encounter: Payer: Self-pay | Admitting: Cardiology

## 2021-10-03 NOTE — Progress Notes (Unsigned)
Cardiology Office Note:    Date:  10/04/2021   ID:  Amye Grego [place], [place] 09-Jul-1950, MRN [id number]  PCP:  Kelton Pillar, MD  Cardiologist:  Shirlee More, MD    Referring MD: Kelton Pillar, MD    ASSESSMENT:    1. Preoperative cardiovascular examination   2. PAT (paroxysmal atrial tachycardia) ([place])   3. Hypertensive kidney disease with stage 1 chronic kidney disease   4. Lymphedema of both lower extremities   5. Chronic venous insufficiency of lower extremity    PLAN:    In order of problems listed above:  Cardiac problems include atrial arrhythmia and chronic edema due to venous insufficiency and lymphedema.  My opinion she is optimized for the planned surgical procedure I would continue her beta-blocker through the perioperative period and if admitted to the hospital placement a monitored bed for the first 24 hours.  I do not think she requires any further cardiac diagnostic testing Well-controlled continue her beta-blocker which is also been effective for her high blood pressure Stable on recent labs At her request low-dose distal diuretic as needed spironolactone 25 mg daily although I do not think it is can make a big difference in her dependent edema and referral for another vein and vascular surgeon   Next appointment: As needed   Medication Adjustments/Labs and Tests Ordered: Current medicines are reviewed at length with the patient today.  Concerns regarding medicines are outlined above.  No orders of the defined types were placed in this encounter.  No orders of the defined types were placed in this encounter.   Chief Complaint  Patient presents with   Edema   Pre-op Exam    History of Present Illness:    Tisa Weisel is a 71 y.o. female with a hx of palpitation with symptomatic APCs PVCs atrial tachycardia hypertension and CKD hypothyroidism chronic pain syndrome and lymphedema last seen 04/14/2020.  She has a history of venous disease  with left greater saphenous vein stripping left lower extremity phlebectomy with chronic venous insufficiency and lymphedema.  She has been followed by vascular surgery at [facility] and has used a lymphedema pump.  Compliance with diet, lifestyle and medications: Yes  There are many aspects of this visit the first is that she intends to have redo spine surgery Dr. Rennis Harding tells me she has a preoperative visit Second she is displeased with the results from her vein stripping and would like a referral to another vein and vascular group She has a long history of chronic venous insufficiency and lymphedema ask of a diuretic be beneficial I do not want to give her a loop diuretic to affect her kidney function particular avoid hypotension taking both Elavil and tizanidine that can cause orthostatic hypotension and I agreed to give her prescription for spironolactone she can take as needed but told her I think her problem is really not 1 that lends itself to diuretic therapy as a solution. She is not having palpitation takes a low-dose beta-blocker does not have CAD or heart failure and I told her I do not think we need to repeat cardiac diagnostic testing she has had multiple normal myocardial perfusion studies in the past.  Recent labs 09/11/2021 TSH normal 1.26 CMP normal albumin 4.3 creatinine 0.94 potassium 4.7 sodium 140  She was evaluated for edema in 2020 with a normal proBNP level chest x-ray normal echocardiogram showing grade 1 diastolic dysfunction left ventricular ejection fraction 60 to 65% normal right ventricular size wall  thickness and function no valvular abnormality and right atrial pressure was normal 3 mmHg. 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 Bettina Gavia per patient report.  Overview:  CAD risk score is low at 3%   Chondromalacia patellae 12/02/2012   Chronic back pain    Chronic kidney disease    Chronic knee pain    Chronic pain 09/22/2010   Multiple sources of pain: radicular pain from lumbar disease and post-operative pain; neck pain from cervical disk disease; left knee pain from trauma.  Plan - will wean off prednisone           Will continue percocet at 1  5/325 norco every 6 hours prn           NSAID - trial of meloxicam 47mg once a day           Increase gabapentin slowly to '600mg'$  tid and reassess           Executed a pain con   Chronic venous insufficiency 10/10/2013   Colitis 10/21/2016   Collagen vascular disease ([place]    Deformity of joint 04/23/2013   Depression with anxiety 09/21/2010   Derangement of knee, left 2009   started with fall at work. Has had repair patella and torn meniscus   DJD (degenerative joint disease) of knee 09/02/2012   Essential hypertension 07/08/2010   Long standing hypertension medically managed. Currently seeing Dr. HDaneen Schickand Dr. MMarin Roberts([facility]) for cardiology.  Meds  HCT only  Overview:  Overview:  Long standing hypertension medically managed. Currently seeing Dr. HDaneen Schickand Dr. MBettina Gavia([facility]) for cardiology.  Meds  HCT only  Last Assessment & Plan:  Formatting of this note may be different from the original. BP Readings   Fever chills 10/21/2016   Fibromyalgia 02/27/2017   High cholesterol    Hx of appendectomy    Hypertension    Hypertensive chronic kidney disease 07/08/2010   Long standing hypertension medically managed. Currently seeing Dr. HDaneen Schickand Dr. MMarin Roberts([facility]) for cardiology.  Meds  HCT only  Overview:  Overview:  Long standing  hypertension medically managed. Currently seeing Dr. HDaneen Schickand Dr. MBettina Gavia([facility]) for cardiology.  Meds  HCT only  Last Assessment & Plan:  Formatting of this note may be different from the original. BP Readings   Knee internal derangement 07/08/2010   Left knee: Feb '09 medial and lateral meniscal tears by MRI                  April '09 arthroscopic surgery    Melanoma ([place] 11/19/2015   Migraine headache    hormonal related - less frequent off hormone replacement   Migraines    Neuropathy 04/23/2013   PAT (paroxysmal atrial tachycardia) ([place] 02/17/2018   Peripheral cyanosis 09/28/2012   Full evaluation by Dr. STobie Lordsfor rheumatology - no evidence of Raynaud's syndrome or disease; no evidence of underlying rheumatologic disease or malignancy. Most probable cause is neurogenic.  Pseudoarthrosis of lumbar spine 01/02/2011   Psoriasis 04/23/2013   Raynaud disease    Routine health maintenance 08/17/2011   Colonoscopy - no record in EPIC of colonoscopy  Immunizations - Tdap June '13.     Salmonella enteritis 10/23/2016   Sepsis ([place]) 10/23/2016   Spinal stenosis 08/14/2010   history of cervical laminectomy, lumbar diskectomy with fixation devices and redo surgery.    Status post lumbar spinal fusion 10/02/2011   SVT (supraventricular tachycardia) (HCC)    Synovial cyst of lumbar facet joint    lower back - s/p surgery    Tear of lateral meniscus of left knee 09/30/2012    Past Surgical History:  Procedure Laterality Date   APPENDECTOMY  1971   CERVICAL DISCECTOMY  1999   diskectomy with fixation:plate and screws   COLONOSCOPY     ESOPHAGOGASTRODUODENOSCOPY  12/2014   KNEE ARTHROSCOPY W/ MENISCAL REPAIR  09   left knee   L4-5 lumbar fusion  September 02, 2010   Dr. Hal Neer:  minimally invasive transforaminal interbody fusion with spacer   OVARIAN CYST SURGERY  1972   PATELLAR REEFING  '09   repair of fractured patella after fall   [place]   2007   lumbar spine   VENOUS ABLATION     for pain in the groin - [facility] did procedure. Has some residual discomfort at the  ablation site.    WISDOM TOOTH EXTRACTION      Current Medications: Current Meds  Medication Sig   amitriptyline (ELAVIL) 10 MG tablet Take 20 mg by mouth at bedtime.    Apple Cid Vn-Grn Tea-Bit Or-Cr (APPLE CIDER VINEGAR PLUS) TABS Take 1 tablet by mouth daily.   cholecalciferol (VITAMIN D) 1000 units tablet Take 1,000 Units by mouth 2 (two) times daily.    ciclopirox (PENLAC) 8 % solution Apply topically at bedtime. Apply to nail/surrounding skin and daily over previous coat. Every 7 days remove with alcohol and continue.   clonazePAM (KLONOPIN) 1 MG tablet Take 1 tablet (1 mg total) by mouth at bedtime as needed for anxiety.   esomeprazole (NEXIUM) 40 MG capsule Take 40 mg by mouth daily.   fluticasone (FLONASE) 50 MCG/ACT nasal spray Place 1 spray into both nostrils daily as needed for allergies.    gabapentin (NEURONTIN) 100 MG capsule Take 100 mg by mouth daily.   Hyoscyamine Sulfate SL (LEVSIN/SL) 0.125 MG SUBL Place 0.125 mg under the tongue 4 (four) times daily.   levothyroxine (SYNTHROID) 88 MCG tablet Take 88 mcg by mouth every morning.   meloxicam (MOBIC) 15 MG tablet Take 15 mg by mouth daily as needed for pain.   metoprolol succinate (TOPROL-XL) 25 MG 24 hr tablet TAKE 1 TABLET BY MOUTH 2 TIMES DAILY.   Multiple Vitamins-Minerals (MULTIVITAMIN ADULT PO) Take 1 tablet by mouth daily.    potassium chloride (K-DUR) 10 MEQ tablet Take 1 tablet in the morning on Monday, Wednesday, and Friday.   sucralfate (CARAFATE) 1 G tablet Take 1 g by mouth daily.   tiZANidine (ZANAFLEX) 2 MG tablet Take 2 mg by mouth 2 (two) times daily as needed. Reported on 04/05/2015   Turmeric 500 MG CAPS Take 1,000 mg by mouth daily.   zinc gluconate 50 MG tablet Take 50 mg by mouth daily.     Allergies:   Epinephrine, Nsaids, Other, Pregabalin, Tolmetin, Ciprofloxacin, Parathyroid  hormone (recomb), Prednisone, Tape, Tapentadol, Captopril, Lisinopril, Celecoxib, Codeine, Lidocaine hcl, Procaine, Sulfa antibiotics,  and Sulfasalazine   Social History   Socioeconomic History   Marital status: Married    Spouse name: Not on file   Number of children: Not on file   Years of education: 14   Highest education level: Not on file  Occupational History   Occupation: RECEP/ADMIN ASST.    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 1 dtr- '72; 1 son '73; 4 grandchildren, 2 step-grands. 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   Right handed   Lives at home with spouse    2-3 cups daily of caffeine    Social Determinants of Health   Financial Resource Strain: Not on file  Food Insecurity: Not on file  Transportation Needs: Not on file  Physical Activity: Not on file  Stress: Not on file  Social Connections: Not on file     Family History: The patient's family history includes Aortic aneurysm in her mother; COPD in her mother; Cancer in her father; Diabetes in her brother, father, and mother; Heart attack in her father; Heart disease in her father; Hyperlipidemia in her father; Hypertension in her father and mother; Hypothyroidism in her mother. ROS:   Please see the history of present illness.    All other systems reviewed and are negative.  EKGs/Labs/Other Studies Reviewed:    The following studies were reviewed today:  EKG:  EKG ordered today and personally reviewed.  The ekg ordered today demonstrates sinus rhythm and  normal  Recent Labs: See history   Physical Exam:    VS:  BP 130/80 (BP Location: Right Arm, Patient Position: Sitting, Cuff Size: Normal)   Pulse 63   Ht '5\' 5"'$  (1.651 m)   Wt 231 lb (104.8 kg)   SpO2 94%   BMI 38.44 kg/m     Wt Readings from Last 3 Encounters:  10/04/21 231 lb (104.8 kg)  04/10/21 147 lb (66.7 kg)  04/14/20 208 lb (94.3 kg)     GEN: Appears her age well nourished, well developed in no acute distress HEENT: Normal NECK: No JVD; No carotid bruits LYMPHATICS: No lymphadenopathy CARDIAC: RRR, no murmurs, rubs, gallops RESPIRATORY:  Clear to auscultation without rales, wheezing or rhonchi  ABDOMEN: Soft, non-tender, non-distended MUSCULOSKELETAL: He has no pitting edema today edema; No deformity  SKIN: Warm and dry NEUROLOGIC:  Alert and oriented x 3 PSYCHIATRIC:  Normal affect    Signed, Shirlee More, MD  10/04/2021 10:42 AM    [place]

## 2021-10-04 ENCOUNTER — Encounter: Payer: Self-pay | Admitting: Cardiology

## 2021-10-04 ENCOUNTER — Ambulatory Visit (INDEPENDENT_AMBULATORY_CARE_PROVIDER_SITE_OTHER): Payer: Medicare Other | Admitting: Cardiology

## 2021-10-04 ENCOUNTER — Telehealth: Payer: Self-pay | Admitting: *Deleted

## 2021-10-04 VITALS — BP 130/80 | HR 63 | Ht 65.0 in | Wt 231.0 lb

## 2021-10-04 DIAGNOSIS — I890 Lymphedema, not elsewhere classified: Secondary | ICD-10-CM | POA: Diagnosis not present

## 2021-10-04 DIAGNOSIS — I872 Venous insufficiency (chronic) (peripheral): Secondary | ICD-10-CM

## 2021-10-04 DIAGNOSIS — D1801 Hemangioma of skin and subcutaneous tissue: Secondary | ICD-10-CM | POA: Diagnosis not present

## 2021-10-04 DIAGNOSIS — I471 Supraventricular tachycardia: Secondary | ICD-10-CM

## 2021-10-04 DIAGNOSIS — I129 Hypertensive chronic kidney disease with stage 1 through stage 4 chronic kidney disease, or unspecified chronic kidney disease: Secondary | ICD-10-CM | POA: Diagnosis not present

## 2021-10-04 DIAGNOSIS — N181 Chronic kidney disease, stage 1: Secondary | ICD-10-CM | POA: Diagnosis not present

## 2021-10-04 DIAGNOSIS — Z85820 Personal history of malignant melanoma of skin: Secondary | ICD-10-CM | POA: Diagnosis not present

## 2021-10-04 DIAGNOSIS — Z01810 Encounter for preprocedural cardiovascular examination: Secondary | ICD-10-CM | POA: Diagnosis not present

## 2021-10-04 DIAGNOSIS — L820 Inflamed seborrheic keratosis: Secondary | ICD-10-CM | POA: Diagnosis not present

## 2021-10-04 DIAGNOSIS — L812 Freckles: Secondary | ICD-10-CM | POA: Diagnosis not present

## 2021-10-04 MED ORDER — SPIRONOLACTONE 25 MG PO TABS: 25.0000 mg | ORAL_TABLET | Freq: Every day | ORAL | 3 refills | Status: AC | PRN

## 2021-10-04 NOTE — Telephone Encounter (Signed)
   [facility] Pre-operative Risk Assessment    Request for surgical clearance:  What type of surgery is being performed? L1 - S1 rev. Fusion   When is this surgery scheduled? TBD   What type of clearance is required (medical clearance vs. Pharmacy clearance to hold med vs. Both)? Medical  Are there any medications that need to be held prior to surgery and how long?No   Practice name and name of physician performing surgery? [facility], Starling Manns, MD, FAAOS   What is your office phone number [phone]    7.   What is your office fax number [phone]  8.   Anesthesia type (None, local, MAC, general) ? General   [name] 10/04/2021, 11:22 AM  _________________________________________________________________   (provider comments below)

## 2021-10-04 NOTE — Addendum Note (Signed)
Addended by: Edwyna Shell I on: 10/04/2021 11:01 AM   Modules accepted: Orders

## 2021-10-04 NOTE — Patient Instructions (Signed)
Medication Instructions:  Your physician has recommended you make the following change in your medication:   START: Spironolactone 25 mg daily as needed  *If you need a refill on your cardiac medications before your next appointment, please call your pharmacy*   Lab Work: None If you have labs (blood work) drawn today and your tests are completely normal, you will receive your results only by: [place] (if you have MyChart) OR A paper copy in the mail If you have any lab test that is abnormal or we need to change your treatment, we will call you to review the results.   Testing/Procedures: None   Follow-Up: At [facility], you and your health needs are our priority.  As part of our continuing mission to provide you with exceptional heart care, we have created designated Provider Care Teams.  These Care Teams include your primary Cardiologist (physician) and Advanced Practice Providers (APPs -  Physician Assistants and Nurse Practitioners) who all work together to provide you with the care you need, when you need it.  We recommend signing up for the patient portal called "MyChart".  Sign up information is provided on this After Visit Summary.  MyChart is used to connect with patients for Virtual Visits (Telemedicine).  Patients are able to view lab/test results, encounter notes, upcoming appointments, etc.  Non-urgent messages can be sent to your provider as well.   To learn more about what you can do with MyChart, go to [email or web address].    Your next appointment:   Follow up as needed  The format for your next appointment:   In Person  Provider:   Shirlee More, MD    Other Instructions None  Important Information About Sugar

## 2021-10-05 NOTE — Telephone Encounter (Signed)
   Patient Name: [name]  DOB: Jun 04, 1950 MRN: [id number]  Primary Cardiologist: Shirlee More, MD  Chart reviewed as part of pre-operative protocol coverage. Given past medical history and time since last visit, based on ACC/AHA guidelines, [name] would be at acceptable risk for the planned procedure without further cardiovascular testing.   Patient was seen by Dr. Bettina Gavia in the office on 10/04/2021, according to Dr. Joya Gaskins note "Cardiac problems include atrial arrhythmia and chronic edema due to venous insufficiency and lymphedema.  My opinion she is optimized for the planned surgical procedure I would continue her beta-blocker through the perioperative period and if admitted to the hospital placement a monitored bed for the first 24 hours.  I do not think she requires any further cardiac diagnostic testing"  I will route this recommendation to the requesting party via Epic fax function and remove from pre-op pool.  Please call with questions.  [place], [place] 10/05/2021, 10:01 PM

## 2021-10-17 DIAGNOSIS — M5412 Radiculopathy, cervical region: Secondary | ICD-10-CM | POA: Insufficient documentation

## 2021-10-17 DIAGNOSIS — G629 Polyneuropathy, unspecified: Secondary | ICD-10-CM | POA: Diagnosis not present

## 2021-10-17 DIAGNOSIS — G5601 Carpal tunnel syndrome, right upper limb: Secondary | ICD-10-CM | POA: Diagnosis not present

## 2021-10-21 DIAGNOSIS — T84296A Other mechanical complication of internal fixation device of vertebrae, initial encounter: Secondary | ICD-10-CM | POA: Diagnosis not present

## 2021-10-21 DIAGNOSIS — M961 Postlaminectomy syndrome, not elsewhere classified: Secondary | ICD-10-CM | POA: Diagnosis not present

## 2021-10-21 DIAGNOSIS — M4326 Fusion of spine, lumbar region: Secondary | ICD-10-CM | POA: Diagnosis not present

## 2021-10-21 DIAGNOSIS — M4322 Fusion of spine, cervical region: Secondary | ICD-10-CM | POA: Diagnosis not present

## 2021-11-09 ENCOUNTER — Other Ambulatory Visit: Payer: Self-pay | Admitting: Cardiology

## 2021-11-09 NOTE — Telephone Encounter (Signed)
Rx refill sent to pharmacy. 

## 2021-11-22 DIAGNOSIS — Z1272 Encounter for screening for malignant neoplasm of vagina: Secondary | ICD-10-CM | POA: Diagnosis not present

## 2021-11-22 DIAGNOSIS — Z124 Encounter for screening for malignant neoplasm of cervix: Secondary | ICD-10-CM | POA: Diagnosis not present

## 2021-11-22 DIAGNOSIS — Z1151 Encounter for screening for human papillomavirus (HPV): Secondary | ICD-10-CM | POA: Diagnosis not present

## 2021-11-22 DIAGNOSIS — R87610 Atypical squamous cells of undetermined significance on cytologic smear of cervix (ASC-US): Secondary | ICD-10-CM | POA: Diagnosis not present

## 2021-11-22 DIAGNOSIS — N898 Other specified noninflammatory disorders of vagina: Secondary | ICD-10-CM | POA: Diagnosis not present

## 2021-11-22 DIAGNOSIS — Z0142 Encounter for cervical smear to confirm findings of recent normal smear following initial abnormal smear: Secondary | ICD-10-CM | POA: Diagnosis not present

## 2021-12-01 ENCOUNTER — Encounter (HOSPITAL_COMMUNITY): Payer: Medicare Other

## 2021-12-06 ENCOUNTER — Other Ambulatory Visit: Payer: Self-pay | Admitting: *Deleted

## 2021-12-06 DIAGNOSIS — Z23 Encounter for immunization: Secondary | ICD-10-CM | POA: Diagnosis not present

## 2021-12-06 DIAGNOSIS — M7989 Other specified soft tissue disorders: Secondary | ICD-10-CM

## 2021-12-09 DIAGNOSIS — Z1231 Encounter for screening mammogram for malignant neoplasm of breast: Secondary | ICD-10-CM | POA: Diagnosis not present

## 2021-12-16 ENCOUNTER — Ambulatory Visit (HOSPITAL_COMMUNITY)
Admission: RE | Admit: 2021-12-16 | Discharge: 2021-12-16 | Disposition: A | Discharge status: Home / Self Care | Payer: Medicare Other | Source: Ambulatory Visit | Attending: Physician Assistant | Admitting: Physician Assistant

## 2021-12-16 ENCOUNTER — Ambulatory Visit (INDEPENDENT_AMBULATORY_CARE_PROVIDER_SITE_OTHER): Payer: Medicare Other | Admitting: Physician Assistant

## 2021-12-16 VITALS — BP 152/84 | HR 87 | Temp 98.0°F | Resp 16 | Ht 65.0 in | Wt 235.0 lb

## 2021-12-16 DIAGNOSIS — M7989 Other specified soft tissue disorders: Secondary | ICD-10-CM | POA: Insufficient documentation

## 2021-12-16 DIAGNOSIS — I890 Lymphedema, not elsewhere classified: Secondary | ICD-10-CM | POA: Diagnosis not present

## 2021-12-16 NOTE — Progress Notes (Signed)
Requested by:  Richardo Priest, MD [address] [place],  [place] [address]  Reason for consultation: concern for DVT    History of Present Illness   [name] is a 71 y.o. (1950-10-23) female who presents with persistent LLE swelling and concern for DVT.  She has previously been seen by Dr. Donnetta Hutching for left GSV reflux, to which she underwent ablation plus stab phlebectomy of multiple tributary varicosities in 2011.  This helped her swelling moderately. Her last visit regarding continued left leg swelling was with Dr. Donnetta Hutching in October 2017.  She did have reflux in her left common femoral vein and minimal dilatation of the anterior accessory branch.  It was felt that she would only have minimal if any improvement in treating the anterior accessory branch, so it was recommended she continued conservative treatment with compression and elevation. She was last seen by Dr. Donnetta Hutching on 03/22/2016 for right foot pain and swelling.  DVT study done at that visit demonstrated no DVT or SVT.  Her right foot pain was thought to be caused by her multiple back surgeries and issues with that. She was reassured that she does not have any evidence of arterial or venous pathology in her right leg.  She was then evaluated by [facility] in [place] in 2021 for recurrence of varicosities in her left leg.  They performed left GSV stripping with left lower extremity phlebectomy in May 2021.  Her vein was very tortuous and required multiple cutdowns.  Postop she had issues with infection and hematoma formation.  She was placed on antibiotics and her infection resolved.  She was recommended to follow-up as needed and continue using her lymphedema pumps.  Today she is here for evaluation of possible DVT in the left leg.  She is getting back surgery at the end of this month, and her surgeon wants to ensure she is safe for surgery.  She states she continues to have bilateral leg swelling that is  worse at the end of the day.  She notes an area on her left calf that "bulges" when going from a sitting to standing position.  This "bulge" has been present since her surgery in 2021.  She denies any pain in that area.  She denies any ulcerations.  She denies a history of DVT.  She denies any symptoms of PAD such as claudication, rest pain, nonhealing wounds of the lower extremities.  She does have diabetic neuropathy in her right foot.  She takes gabapentin for this.  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 Bettina Gavia per patient report.  Overview:  CAD risk score is low at 3%   Chondromalacia patellae 12/02/2012   Chronic back pain    Chronic kidney disease    Chronic knee pain    Chronic pain 09/22/2010   Multiple sources of pain: radicular pain from lumbar disease and post-operative pain; neck pain from cervical disk disease; left knee pain from trauma.  Plan - will wean off  prednisone           Will continue percocet at 1  5/325 norco every 6 hours prn           NSAID - trial of meloxicam 26mg once a day           Increase gabapentin slowly to '600mg'$  tid and reassess           Executed a pain con   Chronic venous insufficiency 10/10/2013   Colitis 10/21/2016   Collagen vascular disease ([facility]    Deformity of joint 04/23/2013   Depression with anxiety 09/21/2010   Derangement of knee, left 2009   started with fall at work. Has had repair patella and torn meniscus   DJD (degenerative joint disease) of knee 09/02/2012   Essential hypertension  07/08/2010   Long standing hypertension medically managed. Currently seeing Dr. HDaneen Schickand Dr. MMarin Roberts([facility]) for cardiology.  Meds  HCT only  Overview:  Overview:  Long standing hypertension medically managed. Currently seeing Dr. HDaneen Schickand Dr. MBettina Gavia([facility]) for cardiology.  Meds  HCT only  Last Assessment & Plan:  Formatting of this note may be different from the original. BP Readings   Fever chills 10/21/2016   Fibromyalgia 02/27/2017   High cholesterol    Hx of appendectomy    Hypertension    Hypertensive chronic kidney disease 07/08/2010   Long standing hypertension medically managed. Currently seeing Dr. HDaneen Schickand Dr. MMarin Roberts([facility]) for cardiology.  Meds  HCT only  Overview:  Overview:  Long standing hypertension medically managed. Currently seeing Dr. HDaneen Schickand Dr. MBettina Gavia([facility]) for cardiology.  Meds  HCT only  Last Assessment & Plan:  Formatting of this note may be different from the original. BP Readings   Knee internal derangement 07/08/2010   Left knee: Feb '09 medial and lateral meniscal tears by MRI                  April '09 arthroscopic surgery    Melanoma ([facility] 11/19/2015   Migraine headache    hormonal related - less frequent off hormone replacement   Migraines    Neuropathy 04/23/2013   [facility] ([facility]mal atrial tachycardia) ([facility] 02/17/2018   Peripheral cyanosis 09/28/2012   Full evaluation by Dr. STobie Lordsfor rheumatology - no evidence of Raynaud's syndrome or disease; no evidence of underlying rheumatologic disease or malignancy. Most probable cause is neurogenic.    Pseudoarthrosis of lumbar spine 01/02/2011   Psoriasis 04/23/2013   Raynaud disease    Routine health maintenance 08/17/2011   Colonoscopy - no record in EPIC of colonoscopy  Immunizations - Tdap June '13.     Salmonella enteritis 10/23/2016   Sepsis ([facility] 10/23/2016   Spinal stenosis 08/14/2010   history of cervical laminectomy, lumbar diskectomy with fixation devices  and redo surgery.    Status post lumbar spinal fusion 10/02/2011   SVT (supraventricular tachycardia) (HCC)    Synovial cyst of lumbar facet joint    lower back - s/p surgery    Tear of lateral meniscus of left knee 09/30/2012    Past Surgical History:  Procedure Laterality Date   APPENDECTOMY  1971   CERVICAL DISCECTOMY  1999   diskectomy with fixation:plate and screws   COLONOSCOPY     ESOPHAGOGASTRODUODENOSCOPY  12/2014   KNEE ARTHROSCOPY W/ MENISCAL REPAIR  09   left knee   L4-5 lumbar fusion  September 02, 2010   Dr. KHal Neer  minimally invasive transforaminal  interbody fusion with spacer   OVARIAN CYST SURGERY  1972   PATELLAR REEFING  '09   repair of fractured patella after fall   SPINE SURGERY     SYNOVIAL CYST EXCISION  2007   lumbar spine   VENOUS ABLATION     for pain in the groin - [facility] did procedure. Has some residual discomfort at the  ablation site.    WISDOM TOOTH EXTRACTION      Social History   Socioeconomic History   Marital status: Married    Spouse name: Not on file   Number of children: Not on file   Years of education: 14   Highest education level: Not on file  Occupational History   Occupation: RECEP/ADMIN ASST.    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 1 dtr- '72; 1 son '73; 4 grandchildren, 2 step-grands. 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   Right handed   Lives at home with spouse    2-3 cups daily of caffeine    Social Determinants of Health   Financial Resource Strain: Not on file  Food Insecurity: Not  on file  Transportation Needs: Not on file  Physical Activity: Not on file  Stress: Not on file  Social Connections: Not on file  Intimate Partner Violence: Not on file    Family History  Problem Relation Age of Onset   Diabetes Mother    COPD Mother    Aortic aneurysm Mother    Hypertension Mother    Hypothyroidism Mother    Cancer Father        lung   Hyperlipidemia Father    Hypertension Father    Heart disease Father    Diabetes Father    Heart attack Father    Diabetes Brother     Current Outpatient Medications  Medication Sig Dispense Refill   amitriptyline (ELAVIL) 10 MG tablet Take 20 mg by mouth at bedtime.      Apple Cid Vn-Grn Tea-Bit Or-Cr (APPLE CIDER VINEGAR PLUS) TABS Take 1 tablet by mouth daily.     cholecalciferol (VITAMIN D) 1000 units tablet Take 1,000 Units by mouth 2 (two) times daily.      ciclopirox (PENLAC) 8 % solution Apply topically at bedtime. Apply to nail/surrounding skin and daily over previous coat. Every 7 days remove with alcohol and continue. 6.6 mL 11   clonazePAM (KLONOPIN) 1 MG tablet Take 1 tablet (1 mg total) by mouth at bedtime as needed for anxiety. 30 tablet 0   esomeprazole (NEXIUM) 40 MG capsule Take 40 mg by mouth daily.     fluticasone (FLONASE) 50 MCG/ACT nasal spray Place 1 spray into both nostrils daily as needed for allergies.      gabapentin (NEURONTIN) 100 MG capsule Take 100 mg by mouth daily.     Hyoscyamine Sulfate SL (LEVSIN/SL) 0.125 MG SUBL Place 0.125 mg under the tongue 4 (four) times daily. 40 each 0   levothyroxine (SYNTHROID) 88 MCG tablet Take 88 mcg by mouth every  morning.     meloxicam (MOBIC) 15 MG tablet Take 15 mg by mouth daily as needed for pain.     metoprolol succinate (TOPROL-XL) 25 MG 24 hr tablet TAKE 1 TABLET BY MOUTH 2 TIMES DAILY. 180 tablet 2   Multiple Vitamins-Minerals (MULTIVITAMIN ADULT PO) Take 1 tablet by mouth daily.      potassium chloride (KLOR-CON) 10 MEQ tablet TAKE 1 TABLET IN THE  MORNING ON MONDAY, WEDNESDAY, AND FRIDAY. 12 tablet 3   spironolactone (ALDACTONE) 25 MG tablet Take 1 tablet (25 mg total) by mouth daily as needed. 90 tablet 3   sucralfate (CARAFATE) 1 G tablet Take 1 g by mouth daily.     tiZANidine (ZANAFLEX) 2 MG tablet Take 2 mg by mouth 2 (two) times daily as needed. Reported on 04/05/2015     Turmeric 500 MG CAPS Take 1,000 mg by mouth daily.     zinc gluconate 50 MG tablet Take 50 mg by mouth daily.     No current facility-administered medications for this visit.    Allergies  Allergen Reactions   Epinephrine Palpitations    NASAL SPRAY Cardiac dysrhythmia     Nsaids Swelling    swelling     Other Swelling and Palpitations    Glucocorticoids/Steroids **ANTI INFLAMMATORY DRUGS**    Pregabalin Anaphylaxis   Tolmetin Swelling   Ciprofloxacin     Other reaction(s): nausea, Unknown   Parathyroid Hormone (Recomb)     Other reaction(s): nose bleeds, Unknown   Prednisone Swelling   Tape Rash and Other (See Comments)    *adhesive* Reaction- blisters  Rips her skin     Tapentadol Other (See Comments)    *adhesive* Reaction- blisters    Captopril    Lisinopril Swelling and Other (See Comments)    Dizziness     Celecoxib Palpitations    Cardiac dysrhythmia    Codeine Palpitations    Cardiac dysrhythmia     Lidocaine Hcl Palpitations   Procaine Palpitations and Other (See Comments)    dizziness    Sulfa Antibiotics Palpitations    Cardiac dysrhythmia    Sulfasalazine Palpitations    REVIEW OF SYSTEMS (negative unless checked):   Cardiac:  '[]'$  Chest pain or chest pressure? '[]'$  Shortness of breath upon activity? '[]'$  Shortness of breath when lying flat? '[]'$  Irregular heart rhythm?  Vascular:  '[]'$  Pain in calf, thigh, or hip brought on by walking? '[]'$  Pain in feet at night that wakes you up from your sleep? '[]'$  Blood clot in your veins? '[x]'$  Leg swelling?  Pulmonary:  '[]'$  Oxygen at home? '[]'$  Productive cough? '[]'$   Wheezing?  Neurologic:  '[]'$  Sudden weakness in arms or legs? '[]'$  Sudden numbness in arms or legs? '[]'$  Sudden onset of difficult speaking or slurred speech? '[]'$  Temporary loss of vision in one eye? '[]'$  Problems with dizziness?  Gastrointestinal:  '[]'$  Blood in stool? '[]'$  Vomited blood?  Genitourinary:  '[]'$  Burning when urinating? '[]'$  Blood in urine?  Psychiatric:  '[]'$  Major depression  Hematologic:  '[]'$  Bleeding problems? '[]'$  Problems with blood clotting?  Dermatologic:  '[]'$  Rashes or ulcers?  Constitutional:  '[]'$  Fever or chills?  Ear/Nose/Throat:  '[]'$  Change in hearing? '[]'$  Nose bleeds? '[]'$  Sore throat?  Musculoskeletal:  '[]'$  Back pain? '[]'$  Joint pain? '[]'$  Muscle pain?   Physical Examination     Vitals:   12/16/21 1058  BP: (!) 152/84  Pulse: 87  Resp: 16  Temp: 98 F (36.7 C)  TempSrc: Temporal  SpO2: 98%  Weight: 235 lb (106.6 kg)  Height: '5\' 5"'$  (1.651 m)   Body mass index is 39.11 kg/m.  General:  WDWN in NAD; vital signs documented above Gait: Not observed HENT: WNL, normocephalic Pulmonary: normal non-labored breathing , without Rales, rhonchi,  wheezing Cardiac: regular HR, without carotid bruit Abdomen: soft, NT, no masses Skin: without rashes Vascular Exam/Pulses: Palpable DP pulses bilaterally Extremities: with varicose veins, without reticular veins, with edema, without stasis pigmentation, without lipodermatosclerosis, without ulcers Musculoskeletal: no muscle wasting or atrophy  Neurologic: A&O X 3;  No focal weakness or paresthesias are detected Psychiatric:  The pt has Normal affect.  Non-invasive Vascular Imaging   LLE Venous Insufficiency Duplex (12/16/2021):   LEFT          Reflux NoReflux  Reflux   Diameter  Comments                                         Yes     Time       cms                             +--------------+---------+------+----------+-----------+-------------------  ---+  CFV           no                                                            +--------------+---------+------+----------+-----------+-------------------  ---+  FV prox       no                                                           +--------------+---------+------+----------+-----------+-------------------  ---+  FV mid        no                                                           +--------------+---------+------+----------+-----------+-------------------  ---+  FV dist       no                                                           +--------------+---------+------+----------+-----------+-------------------  ---+  Popliteal     no                                                           +--------------+---------+------+----------+-----------+-------------------  ---+  [facility]V at [facility]    no  1.0    prior                                                                       ablation/stripping       +--------------+---------+------+----------+-----------+-------------------  ---+  GSV prox thigh                                    prior                                                                       ablation/stripping       +--------------+---------+------+----------+-----------+-------------------  ---+  GSV mid thigh                                     prior                                                                       ablation/stripping       +--------------+---------+------+----------+-----------+-------------------  ---+  GSV dist thigh                                    prior                                                                       ablation/stripping       +--------------+---------+------+----------+-----------+-------------------  ---+  GSV at knee                                       prior                                                                        ablation/stripping       +--------------+---------+------+----------+-----------+-------------------  ---+  SSV Pop Fossa no  0.14                             +--------------+---------+------+----------+-----------+-------------------  ---+  SSV prox calf no                          0.19                             +--------------+---------+------+----------+-----------+-------------------  ---+  AASV prox               yes   >500 ms     0.20                             thigh                                                                      +--------------+---------+------+----------+-----------+-------------------    Medical Decision Making   Earma Nicolaou is a 71 y.o. female who presents with lymphedema and evaluation for LLE DVT  She has continued bilateral lower extremity swelling with diagnosis of lymphedema from [facility] in [place].  Her lower extremity swelling is well managed with use of lymphedema pumps 2 times daily. LLE reflux study today shows reflux in her anterior accessory vein with minimal dilatation of 0.2.  Treatment of this vein as discussed before, would likely not help her lower extremity swelling. This study today also demonstrated no left lower extremity DVT or SVT.  Therefore there is no concern from vascular standpoint for the patient to undergo redo back surgery. I have discussed with the patient that the tissue bulging in her left calf is likely due to how she healed after her surgery in 2021.  The tissue is soft, and imaging today shows no evidence of fluid collection or hematoma.  She is encouraged to wear her compression stockings when moving around and continuing the use of her lymphedema pumps She can follow-up with [place] as needed   Gerri Lins, PA-C [facility]: [phone]  12/16/2021, 9:08 AM  Clinic MD:  Virl Cagey

## 2021-12-23 ENCOUNTER — Other Ambulatory Visit: Payer: Self-pay | Admitting: Cardiology

## 2021-12-23 DIAGNOSIS — M48062 Spinal stenosis, lumbar region with neurogenic claudication: Secondary | ICD-10-CM | POA: Diagnosis not present

## 2021-12-23 DIAGNOSIS — G5601 Carpal tunnel syndrome, right upper limb: Secondary | ICD-10-CM | POA: Diagnosis not present

## 2021-12-23 DIAGNOSIS — I872 Venous insufficiency (chronic) (peripheral): Secondary | ICD-10-CM | POA: Diagnosis not present

## 2021-12-23 DIAGNOSIS — M532X6 Spinal instabilities, lumbar region: Secondary | ICD-10-CM | POA: Diagnosis not present

## 2021-12-23 DIAGNOSIS — R7989 Other specified abnormal findings of blood chemistry: Secondary | ICD-10-CM | POA: Diagnosis not present

## 2021-12-23 DIAGNOSIS — I129 Hypertensive chronic kidney disease with stage 1 through stage 4 chronic kidney disease, or unspecified chronic kidney disease: Secondary | ICD-10-CM

## 2021-12-23 DIAGNOSIS — M961 Postlaminectomy syndrome, not elsewhere classified: Secondary | ICD-10-CM | POA: Diagnosis not present

## 2021-12-23 DIAGNOSIS — M4326 Fusion of spine, lumbar region: Secondary | ICD-10-CM | POA: Diagnosis not present

## 2021-12-23 DIAGNOSIS — Z01812 Encounter for preprocedural laboratory examination: Secondary | ICD-10-CM | POA: Diagnosis not present

## 2021-12-23 DIAGNOSIS — R040 Epistaxis: Secondary | ICD-10-CM | POA: Diagnosis not present

## 2021-12-27 DIAGNOSIS — N879 Dysplasia of cervix uteri, unspecified: Secondary | ICD-10-CM | POA: Diagnosis not present

## 2022-01-02 DIAGNOSIS — M1909 Primary osteoarthritis, other specified site: Secondary | ICD-10-CM | POA: Diagnosis not present

## 2022-01-02 DIAGNOSIS — M4716 Other spondylosis with myelopathy, lumbar region: Secondary | ICD-10-CM | POA: Diagnosis not present

## 2022-01-02 DIAGNOSIS — M4726 Other spondylosis with radiculopathy, lumbar region: Secondary | ICD-10-CM | POA: Diagnosis not present

## 2022-01-02 DIAGNOSIS — M810 Age-related osteoporosis without current pathological fracture: Secondary | ICD-10-CM | POA: Diagnosis not present

## 2022-01-02 DIAGNOSIS — M5416 Radiculopathy, lumbar region: Secondary | ICD-10-CM | POA: Diagnosis not present

## 2022-01-02 DIAGNOSIS — I129 Hypertensive chronic kidney disease with stage 1 through stage 4 chronic kidney disease, or unspecified chronic kidney disease: Secondary | ICD-10-CM | POA: Diagnosis not present

## 2022-01-02 DIAGNOSIS — M4316 Spondylolisthesis, lumbar region: Secondary | ICD-10-CM | POA: Diagnosis not present

## 2022-01-02 DIAGNOSIS — I890 Lymphedema, not elsewhere classified: Secondary | ICD-10-CM | POA: Diagnosis not present

## 2022-01-02 DIAGNOSIS — M532X6 Spinal instabilities, lumbar region: Secondary | ICD-10-CM | POA: Diagnosis not present

## 2022-01-02 DIAGNOSIS — I739 Peripheral vascular disease, unspecified: Secondary | ICD-10-CM | POA: Diagnosis not present

## 2022-01-02 DIAGNOSIS — Z79899 Other long term (current) drug therapy: Secondary | ICD-10-CM | POA: Diagnosis not present

## 2022-01-02 DIAGNOSIS — M4306 Spondylolysis, lumbar region: Secondary | ICD-10-CM | POA: Diagnosis not present

## 2022-01-02 DIAGNOSIS — Z981 Arthrodesis status: Secondary | ICD-10-CM | POA: Diagnosis not present

## 2022-01-02 DIAGNOSIS — K219 Gastro-esophageal reflux disease without esophagitis: Secondary | ICD-10-CM | POA: Diagnosis not present

## 2022-01-02 DIAGNOSIS — E039 Hypothyroidism, unspecified: Secondary | ICD-10-CM | POA: Diagnosis not present

## 2022-01-02 DIAGNOSIS — M961 Postlaminectomy syndrome, not elsewhere classified: Secondary | ICD-10-CM | POA: Diagnosis not present

## 2022-01-02 DIAGNOSIS — G8929 Other chronic pain: Secondary | ICD-10-CM | POA: Diagnosis not present

## 2022-01-02 DIAGNOSIS — G96198 Other disorders of meninges, not elsewhere classified: Secondary | ICD-10-CM | POA: Diagnosis not present

## 2022-01-02 DIAGNOSIS — N189 Chronic kidney disease, unspecified: Secondary | ICD-10-CM | POA: Diagnosis not present

## 2022-01-02 DIAGNOSIS — M48062 Spinal stenosis, lumbar region with neurogenic claudication: Secondary | ICD-10-CM | POA: Diagnosis not present

## 2022-01-02 DIAGNOSIS — M5136 Other intervertebral disc degeneration, lumbar region: Secondary | ICD-10-CM | POA: Diagnosis not present

## 2022-01-09 DIAGNOSIS — D509 Iron deficiency anemia, unspecified: Secondary | ICD-10-CM | POA: Diagnosis not present

## 2022-01-09 DIAGNOSIS — G8929 Other chronic pain: Secondary | ICD-10-CM | POA: Diagnosis not present

## 2022-01-09 DIAGNOSIS — K529 Noninfective gastroenteritis and colitis, unspecified: Secondary | ICD-10-CM | POA: Diagnosis not present

## 2022-01-09 DIAGNOSIS — Z85820 Personal history of malignant melanoma of skin: Secondary | ICD-10-CM | POA: Diagnosis not present

## 2022-01-09 DIAGNOSIS — N189 Chronic kidney disease, unspecified: Secondary | ICD-10-CM | POA: Diagnosis not present

## 2022-01-09 DIAGNOSIS — I471 Supraventricular tachycardia, unspecified: Secondary | ICD-10-CM | POA: Diagnosis not present

## 2022-01-09 DIAGNOSIS — I890 Lymphedema, not elsewhere classified: Secondary | ICD-10-CM | POA: Diagnosis not present

## 2022-01-09 DIAGNOSIS — I7300 Raynaud's syndrome without gangrene: Secondary | ICD-10-CM | POA: Diagnosis not present

## 2022-01-09 DIAGNOSIS — I872 Venous insufficiency (chronic) (peripheral): Secondary | ICD-10-CM | POA: Diagnosis not present

## 2022-01-09 DIAGNOSIS — M5146 Schmorl's nodes, lumbar region: Secondary | ICD-10-CM | POA: Diagnosis not present

## 2022-01-09 DIAGNOSIS — I4719 Other supraventricular tachycardia: Secondary | ICD-10-CM | POA: Diagnosis not present

## 2022-01-09 DIAGNOSIS — M2240 Chondromalacia patellae, unspecified knee: Secondary | ICD-10-CM | POA: Diagnosis not present

## 2022-01-09 DIAGNOSIS — M5090 Cervical disc disorder, unspecified, unspecified cervical region: Secondary | ICD-10-CM | POA: Diagnosis not present

## 2022-01-09 DIAGNOSIS — G629 Polyneuropathy, unspecified: Secondary | ICD-10-CM | POA: Diagnosis not present

## 2022-01-09 DIAGNOSIS — Z4789 Encounter for other orthopedic aftercare: Secondary | ICD-10-CM | POA: Diagnosis not present

## 2022-01-09 DIAGNOSIS — F418 Other specified anxiety disorders: Secondary | ICD-10-CM | POA: Diagnosis not present

## 2022-01-09 DIAGNOSIS — I129 Hypertensive chronic kidney disease with stage 1 through stage 4 chronic kidney disease, or unspecified chronic kidney disease: Secondary | ICD-10-CM | POA: Diagnosis not present

## 2022-01-09 DIAGNOSIS — Z8701 Personal history of pneumonia (recurrent): Secondary | ICD-10-CM | POA: Diagnosis not present

## 2022-01-09 DIAGNOSIS — M179 Osteoarthritis of knee, unspecified: Secondary | ICD-10-CM | POA: Diagnosis not present

## 2022-01-09 DIAGNOSIS — G43909 Migraine, unspecified, not intractable, without status migrainosus: Secondary | ICD-10-CM | POA: Diagnosis not present

## 2022-01-09 DIAGNOSIS — Z8619 Personal history of other infectious and parasitic diseases: Secondary | ICD-10-CM | POA: Diagnosis not present

## 2022-01-09 DIAGNOSIS — E7800 Pure hypercholesterolemia, unspecified: Secondary | ICD-10-CM | POA: Diagnosis not present

## 2022-01-09 DIAGNOSIS — M797 Fibromyalgia: Secondary | ICD-10-CM | POA: Diagnosis not present

## 2022-01-09 DIAGNOSIS — D631 Anemia in chronic kidney disease: Secondary | ICD-10-CM | POA: Diagnosis not present

## 2022-01-09 DIAGNOSIS — M359 Systemic involvement of connective tissue, unspecified: Secondary | ICD-10-CM | POA: Diagnosis not present

## 2022-01-10 DIAGNOSIS — M5146 Schmorl's nodes, lumbar region: Secondary | ICD-10-CM | POA: Diagnosis not present

## 2022-01-10 DIAGNOSIS — I129 Hypertensive chronic kidney disease with stage 1 through stage 4 chronic kidney disease, or unspecified chronic kidney disease: Secondary | ICD-10-CM | POA: Diagnosis not present

## 2022-01-10 DIAGNOSIS — Z4789 Encounter for other orthopedic aftercare: Secondary | ICD-10-CM | POA: Diagnosis not present

## 2022-01-10 DIAGNOSIS — I890 Lymphedema, not elsewhere classified: Secondary | ICD-10-CM | POA: Diagnosis not present

## 2022-01-10 DIAGNOSIS — I872 Venous insufficiency (chronic) (peripheral): Secondary | ICD-10-CM | POA: Diagnosis not present

## 2022-01-10 DIAGNOSIS — N189 Chronic kidney disease, unspecified: Secondary | ICD-10-CM | POA: Diagnosis not present

## 2022-01-12 DIAGNOSIS — I129 Hypertensive chronic kidney disease with stage 1 through stage 4 chronic kidney disease, or unspecified chronic kidney disease: Secondary | ICD-10-CM | POA: Diagnosis not present

## 2022-01-12 DIAGNOSIS — I890 Lymphedema, not elsewhere classified: Secondary | ICD-10-CM | POA: Diagnosis not present

## 2022-01-12 DIAGNOSIS — M5146 Schmorl's nodes, lumbar region: Secondary | ICD-10-CM | POA: Diagnosis not present

## 2022-01-12 DIAGNOSIS — Z4789 Encounter for other orthopedic aftercare: Secondary | ICD-10-CM | POA: Diagnosis not present

## 2022-01-12 DIAGNOSIS — N189 Chronic kidney disease, unspecified: Secondary | ICD-10-CM | POA: Diagnosis not present

## 2022-01-12 DIAGNOSIS — I872 Venous insufficiency (chronic) (peripheral): Secondary | ICD-10-CM | POA: Diagnosis not present

## 2022-01-16 DIAGNOSIS — I129 Hypertensive chronic kidney disease with stage 1 through stage 4 chronic kidney disease, or unspecified chronic kidney disease: Secondary | ICD-10-CM | POA: Diagnosis not present

## 2022-01-16 DIAGNOSIS — N189 Chronic kidney disease, unspecified: Secondary | ICD-10-CM | POA: Diagnosis not present

## 2022-01-16 DIAGNOSIS — I890 Lymphedema, not elsewhere classified: Secondary | ICD-10-CM | POA: Diagnosis not present

## 2022-01-16 DIAGNOSIS — I872 Venous insufficiency (chronic) (peripheral): Secondary | ICD-10-CM | POA: Diagnosis not present

## 2022-01-16 DIAGNOSIS — M5146 Schmorl's nodes, lumbar region: Secondary | ICD-10-CM | POA: Diagnosis not present

## 2022-01-16 DIAGNOSIS — Z4789 Encounter for other orthopedic aftercare: Secondary | ICD-10-CM | POA: Diagnosis not present

## 2022-01-17 DIAGNOSIS — I890 Lymphedema, not elsewhere classified: Secondary | ICD-10-CM | POA: Diagnosis not present

## 2022-01-17 DIAGNOSIS — I129 Hypertensive chronic kidney disease with stage 1 through stage 4 chronic kidney disease, or unspecified chronic kidney disease: Secondary | ICD-10-CM | POA: Diagnosis not present

## 2022-01-17 DIAGNOSIS — I872 Venous insufficiency (chronic) (peripheral): Secondary | ICD-10-CM | POA: Diagnosis not present

## 2022-01-17 DIAGNOSIS — M5146 Schmorl's nodes, lumbar region: Secondary | ICD-10-CM | POA: Diagnosis not present

## 2022-01-17 DIAGNOSIS — N189 Chronic kidney disease, unspecified: Secondary | ICD-10-CM | POA: Diagnosis not present

## 2022-01-17 DIAGNOSIS — Z4789 Encounter for other orthopedic aftercare: Secondary | ICD-10-CM | POA: Diagnosis not present

## 2022-01-19 DIAGNOSIS — Z4789 Encounter for other orthopedic aftercare: Secondary | ICD-10-CM | POA: Diagnosis not present

## 2022-01-19 DIAGNOSIS — M5146 Schmorl's nodes, lumbar region: Secondary | ICD-10-CM | POA: Diagnosis not present

## 2022-01-19 DIAGNOSIS — I890 Lymphedema, not elsewhere classified: Secondary | ICD-10-CM | POA: Diagnosis not present

## 2022-01-19 DIAGNOSIS — I872 Venous insufficiency (chronic) (peripheral): Secondary | ICD-10-CM | POA: Diagnosis not present

## 2022-01-19 DIAGNOSIS — I129 Hypertensive chronic kidney disease with stage 1 through stage 4 chronic kidney disease, or unspecified chronic kidney disease: Secondary | ICD-10-CM | POA: Diagnosis not present

## 2022-01-19 DIAGNOSIS — N189 Chronic kidney disease, unspecified: Secondary | ICD-10-CM | POA: Diagnosis not present

## 2022-01-23 DIAGNOSIS — Z4789 Encounter for other orthopedic aftercare: Secondary | ICD-10-CM | POA: Diagnosis not present

## 2022-01-23 DIAGNOSIS — I872 Venous insufficiency (chronic) (peripheral): Secondary | ICD-10-CM | POA: Diagnosis not present

## 2022-01-23 DIAGNOSIS — I129 Hypertensive chronic kidney disease with stage 1 through stage 4 chronic kidney disease, or unspecified chronic kidney disease: Secondary | ICD-10-CM | POA: Diagnosis not present

## 2022-01-23 DIAGNOSIS — N189 Chronic kidney disease, unspecified: Secondary | ICD-10-CM | POA: Diagnosis not present

## 2022-01-23 DIAGNOSIS — I890 Lymphedema, not elsewhere classified: Secondary | ICD-10-CM | POA: Diagnosis not present

## 2022-01-23 DIAGNOSIS — M5146 Schmorl's nodes, lumbar region: Secondary | ICD-10-CM | POA: Diagnosis not present

## 2022-01-25 DIAGNOSIS — I872 Venous insufficiency (chronic) (peripheral): Secondary | ICD-10-CM | POA: Diagnosis not present

## 2022-01-25 DIAGNOSIS — Z4789 Encounter for other orthopedic aftercare: Secondary | ICD-10-CM | POA: Diagnosis not present

## 2022-01-25 DIAGNOSIS — I129 Hypertensive chronic kidney disease with stage 1 through stage 4 chronic kidney disease, or unspecified chronic kidney disease: Secondary | ICD-10-CM | POA: Diagnosis not present

## 2022-01-25 DIAGNOSIS — M5146 Schmorl's nodes, lumbar region: Secondary | ICD-10-CM | POA: Diagnosis not present

## 2022-01-26 DIAGNOSIS — I872 Venous insufficiency (chronic) (peripheral): Secondary | ICD-10-CM | POA: Diagnosis not present

## 2022-01-26 DIAGNOSIS — N189 Chronic kidney disease, unspecified: Secondary | ICD-10-CM | POA: Diagnosis not present

## 2022-01-26 DIAGNOSIS — Z4789 Encounter for other orthopedic aftercare: Secondary | ICD-10-CM | POA: Diagnosis not present

## 2022-01-26 DIAGNOSIS — M5146 Schmorl's nodes, lumbar region: Secondary | ICD-10-CM | POA: Diagnosis not present

## 2022-01-26 DIAGNOSIS — I890 Lymphedema, not elsewhere classified: Secondary | ICD-10-CM | POA: Diagnosis not present

## 2022-01-26 DIAGNOSIS — I129 Hypertensive chronic kidney disease with stage 1 through stage 4 chronic kidney disease, or unspecified chronic kidney disease: Secondary | ICD-10-CM | POA: Diagnosis not present

## 2022-01-30 DIAGNOSIS — I872 Venous insufficiency (chronic) (peripheral): Secondary | ICD-10-CM | POA: Diagnosis not present

## 2022-01-30 DIAGNOSIS — M5146 Schmorl's nodes, lumbar region: Secondary | ICD-10-CM | POA: Diagnosis not present

## 2022-01-30 DIAGNOSIS — I890 Lymphedema, not elsewhere classified: Secondary | ICD-10-CM | POA: Diagnosis not present

## 2022-01-30 DIAGNOSIS — N189 Chronic kidney disease, unspecified: Secondary | ICD-10-CM | POA: Diagnosis not present

## 2022-01-30 DIAGNOSIS — Z4789 Encounter for other orthopedic aftercare: Secondary | ICD-10-CM | POA: Diagnosis not present

## 2022-01-30 DIAGNOSIS — I129 Hypertensive chronic kidney disease with stage 1 through stage 4 chronic kidney disease, or unspecified chronic kidney disease: Secondary | ICD-10-CM | POA: Diagnosis not present

## 2022-01-31 DIAGNOSIS — M5146 Schmorl's nodes, lumbar region: Secondary | ICD-10-CM | POA: Diagnosis not present

## 2022-01-31 DIAGNOSIS — Z4789 Encounter for other orthopedic aftercare: Secondary | ICD-10-CM | POA: Diagnosis not present

## 2022-01-31 DIAGNOSIS — I890 Lymphedema, not elsewhere classified: Secondary | ICD-10-CM | POA: Diagnosis not present

## 2022-01-31 DIAGNOSIS — I129 Hypertensive chronic kidney disease with stage 1 through stage 4 chronic kidney disease, or unspecified chronic kidney disease: Secondary | ICD-10-CM | POA: Diagnosis not present

## 2022-01-31 DIAGNOSIS — I872 Venous insufficiency (chronic) (peripheral): Secondary | ICD-10-CM | POA: Diagnosis not present

## 2022-01-31 DIAGNOSIS — N189 Chronic kidney disease, unspecified: Secondary | ICD-10-CM | POA: Diagnosis not present

## 2022-02-06 DIAGNOSIS — I129 Hypertensive chronic kidney disease with stage 1 through stage 4 chronic kidney disease, or unspecified chronic kidney disease: Secondary | ICD-10-CM | POA: Diagnosis not present

## 2022-02-06 DIAGNOSIS — Z4789 Encounter for other orthopedic aftercare: Secondary | ICD-10-CM | POA: Diagnosis not present

## 2022-02-06 DIAGNOSIS — I872 Venous insufficiency (chronic) (peripheral): Secondary | ICD-10-CM | POA: Diagnosis not present

## 2022-02-06 DIAGNOSIS — I890 Lymphedema, not elsewhere classified: Secondary | ICD-10-CM | POA: Diagnosis not present

## 2022-02-06 DIAGNOSIS — M5146 Schmorl's nodes, lumbar region: Secondary | ICD-10-CM | POA: Diagnosis not present

## 2022-02-06 DIAGNOSIS — N189 Chronic kidney disease, unspecified: Secondary | ICD-10-CM | POA: Diagnosis not present

## 2022-02-08 DIAGNOSIS — M179 Osteoarthritis of knee, unspecified: Secondary | ICD-10-CM | POA: Diagnosis not present

## 2022-02-08 DIAGNOSIS — I872 Venous insufficiency (chronic) (peripheral): Secondary | ICD-10-CM | POA: Diagnosis not present

## 2022-02-08 DIAGNOSIS — Z4789 Encounter for other orthopedic aftercare: Secondary | ICD-10-CM | POA: Diagnosis not present

## 2022-02-08 DIAGNOSIS — Z8619 Personal history of other infectious and parasitic diseases: Secondary | ICD-10-CM | POA: Diagnosis not present

## 2022-02-08 DIAGNOSIS — M5146 Schmorl's nodes, lumbar region: Secondary | ICD-10-CM | POA: Diagnosis not present

## 2022-02-08 DIAGNOSIS — G8929 Other chronic pain: Secondary | ICD-10-CM | POA: Diagnosis not present

## 2022-02-08 DIAGNOSIS — Z85820 Personal history of malignant melanoma of skin: Secondary | ICD-10-CM | POA: Diagnosis not present

## 2022-02-08 DIAGNOSIS — I471 Supraventricular tachycardia, unspecified: Secondary | ICD-10-CM | POA: Diagnosis not present

## 2022-02-08 DIAGNOSIS — D631 Anemia in chronic kidney disease: Secondary | ICD-10-CM | POA: Diagnosis not present

## 2022-02-08 DIAGNOSIS — M797 Fibromyalgia: Secondary | ICD-10-CM | POA: Diagnosis not present

## 2022-02-08 DIAGNOSIS — I4719 Other supraventricular tachycardia: Secondary | ICD-10-CM | POA: Diagnosis not present

## 2022-02-08 DIAGNOSIS — E7800 Pure hypercholesterolemia, unspecified: Secondary | ICD-10-CM | POA: Diagnosis not present

## 2022-02-08 DIAGNOSIS — I890 Lymphedema, not elsewhere classified: Secondary | ICD-10-CM | POA: Diagnosis not present

## 2022-02-08 DIAGNOSIS — G43909 Migraine, unspecified, not intractable, without status migrainosus: Secondary | ICD-10-CM | POA: Diagnosis not present

## 2022-02-08 DIAGNOSIS — I129 Hypertensive chronic kidney disease with stage 1 through stage 4 chronic kidney disease, or unspecified chronic kidney disease: Secondary | ICD-10-CM | POA: Diagnosis not present

## 2022-02-08 DIAGNOSIS — M359 Systemic involvement of connective tissue, unspecified: Secondary | ICD-10-CM | POA: Diagnosis not present

## 2022-02-08 DIAGNOSIS — M2240 Chondromalacia patellae, unspecified knee: Secondary | ICD-10-CM | POA: Diagnosis not present

## 2022-02-08 DIAGNOSIS — F418 Other specified anxiety disorders: Secondary | ICD-10-CM | POA: Diagnosis not present

## 2022-02-08 DIAGNOSIS — Z8701 Personal history of pneumonia (recurrent): Secondary | ICD-10-CM | POA: Diagnosis not present

## 2022-02-08 DIAGNOSIS — N189 Chronic kidney disease, unspecified: Secondary | ICD-10-CM | POA: Diagnosis not present

## 2022-02-08 DIAGNOSIS — I7300 Raynaud's syndrome without gangrene: Secondary | ICD-10-CM | POA: Diagnosis not present

## 2022-02-08 DIAGNOSIS — M5090 Cervical disc disorder, unspecified, unspecified cervical region: Secondary | ICD-10-CM | POA: Diagnosis not present

## 2022-02-08 DIAGNOSIS — G629 Polyneuropathy, unspecified: Secondary | ICD-10-CM | POA: Diagnosis not present

## 2022-02-08 DIAGNOSIS — K529 Noninfective gastroenteritis and colitis, unspecified: Secondary | ICD-10-CM | POA: Diagnosis not present

## 2022-02-08 DIAGNOSIS — D509 Iron deficiency anemia, unspecified: Secondary | ICD-10-CM | POA: Diagnosis not present

## 2022-02-09 DIAGNOSIS — M532X6 Spinal instabilities, lumbar region: Secondary | ICD-10-CM | POA: Diagnosis not present

## 2022-02-12 ENCOUNTER — Ambulatory Visit
Admission: EM | Admit: 2022-02-12 | Discharge: 2022-02-12 | Disposition: A | Discharge status: Home / Self Care | Payer: Medicare Other | Attending: Internal Medicine | Admitting: Internal Medicine

## 2022-02-12 ENCOUNTER — Encounter: Payer: Self-pay | Admitting: Emergency Medicine

## 2022-02-12 DIAGNOSIS — Z1152 Encounter for screening for COVID-19: Secondary | ICD-10-CM | POA: Diagnosis not present

## 2022-02-12 DIAGNOSIS — R197 Diarrhea, unspecified: Secondary | ICD-10-CM | POA: Insufficient documentation

## 2022-02-12 DIAGNOSIS — J069 Acute upper respiratory infection, unspecified: Secondary | ICD-10-CM | POA: Diagnosis not present

## 2022-02-12 NOTE — ED Provider Notes (Signed)
[facility]    CSN: [id number] Arrival date & time: 02/12/22  0955      History   Chief Complaint Chief Complaint  Patient presents with   Abdominal Pain   Diarrhea   Cough    congestion    HPI [name] is a 71 y.o. female.   Patient presents with 2 different chief complaints today.  Patient reports cough and nasal congestion that started 2 days ago.  She has taken Mucinex with improvement in symptoms.  She denies any known fevers or sick contacts.  Denies chest pain, shortness of breath, sore throat, ear pain, nausea, vomiting, diarrhea, abdominal pain.  Patient reports that she is not really concerned about the symptoms as medications have been helpful.  She is mainly concerned about the diarrhea that she has been having.  Patient denies formal diagnosis of asthma or COPD.  Patient reports that she has been having diarrhea for approximately 1 week.  At start of symptoms, she was having 4-5 episodes daily but this has now decreased in frequency.  She reports minimal nausea but no associated vomiting.  Denies blood in stool.  She has been able to drink fluids and eat foods.  Patient is very concerned about C. difficile given that she was recently hospitalized approximately 4 weeks ago for spinal surgery.  Her husband also had C. difficile in September.  She reports that she does have some minimal abdominal discomfort that is intermittent.  Denies blood in stool. Denies urinary or bowel incontinence or saddle anesthesia.    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 Bettina Gavia per patient report.  Overview:  CAD risk score is low at 3%   Chondromalacia patellae 12/02/2012   Chronic back pain    Chronic kidney disease    Chronic knee pain    Chronic pain 09/22/2010   Multiple sources of pain: radicular pain from lumbar disease and post-operative pain; neck pain from cervical disk disease; left knee pain from trauma.  Plan - will wean off prednisone           Will continue percocet at 1  5/325 norco every 6 hours prn           NSAID - trial of meloxicam 69mg once a day           Increase gabapentin slowly to '600mg'$  tid and reassess           Executed a pain con   Chronic venous insufficiency 10/10/2013   Colitis 10/21/2016   Collagen vascular disease ([place]    Deformity of joint 04/23/2013   Depression with anxiety 09/21/2010   Derangement of knee, left 2009   started with fall at work. Has had repair patella and torn meniscus   DJD (degenerative joint disease) of knee 09/02/2012   Essential hypertension 07/08/2010   Long standing hypertension medically managed. Currently seeing Dr. HDaneen Schickand Dr. MMarin Roberts([facility]) for cardiology.  Meds  HCT  only  Overview:  Overview:  Long standing hypertension medically managed. Currently seeing Dr. Daneen Schick and Dr. Bettina Gavia ([facility]) for cardiology.  Meds  HCT only  Last Assessment & Plan:  Formatting of this note may be different from the original. BP Readings   Fever chills 10/21/2016   Fibromyalgia 02/27/2017   High cholesterol    Hx of appendectomy    Hypertension    Hypertensive chronic kidney disease 07/08/2010   Long standing hypertension medically managed. Currently seeing Dr. Daneen Schick and Dr. Marin Roberts ([facility]) for cardiology.  Meds  HCT only  Overview:  Overview:  Long standing hypertension medically managed. Currently seeing Dr. Daneen Schick and Dr. Bettina Gavia ([facility]) for cardiology.  Meds  HCT only  Last  Assessment & Plan:  Formatting of this note may be different from the original. BP Readings   Knee internal derangement 07/08/2010   Left knee: Feb '09 medial and lateral meniscal tears by MRI                  April '09 arthroscopic surgery    Melanoma ([place]) 11/19/2015   Migraine headache    hormonal related - less frequent off hormone replacement   Migraines    Neuropathy 04/23/2013   PAT (paroxysmal atrial tachycardia) 02/17/2018   Peripheral cyanosis 09/28/2012   Full evaluation by Dr. Tobie Lords for rheumatology - no evidence of Raynaud's syndrome or disease; no evidence of underlying rheumatologic disease or malignancy. Most probable cause is neurogenic.    Pseudoarthrosis of lumbar spine 01/02/2011   Psoriasis 04/23/2013   Raynaud disease    Routine health maintenance 08/17/2011   Colonoscopy - no record in EPIC of colonoscopy  Immunizations - Tdap June '13.     Salmonella enteritis 10/23/2016   Sepsis ([place]) 10/23/2016   Spinal stenosis 08/14/2010   history of cervical laminectomy, lumbar diskectomy with fixation devices and redo surgery.    Status post lumbar spinal fusion 10/02/2011   SVT (supraventricular tachycardia)    Synovial cyst of lumbar facet joint    lower back - s/p surgery    Tear of lateral meniscus of left knee 09/30/2012    Patient Active Problem List   Diagnosis Date Noted   Synovial cyst of lumbar facet joint    Raynaud disease    Migraines    Migraine headache    Hypertension    Hx of appendectomy    High cholesterol    Collagen vascular disease (HCC)    Chronic knee pain    Chronic kidney disease    Allergy    Arthritis    Chronic right-sided low back pain with right-sided sciatica 12/12/2018   Left-sided trigeminal neuralgia 06/03/2018   Trigeminal neuralgia of left side of face 04/30/2018   APC (atrial premature contractions) 02/17/2018   PAT (paroxysmal atrial tachycardia) 02/17/2018   Palpitations 12/24/2017   Angular cheilitis 05/10/2017    Dysphonia 05/10/2017   Laryngopharyngeal reflux (LPR) 05/10/2017   Mucous retention cyst of maxillary sinus 05/10/2017   Recurrent epistaxis 05/10/2017   Upper airway cough syndrome 04/26/2017   Fibromyalgia 02/27/2017   Sepsis ([place]) 10/23/2016   Salmonella enteritis 10/23/2016   Colitis 10/21/2016   CAP (community acquired pneumonia) 10/21/2016   Fever chills 10/21/2016   Melanoma ([place]) 11/19/2015   SVT (supraventricular tachycardia) 05/20/2015   Chronic venous insufficiency 10/10/2013   Neuropathy 04/23/2013   Psoriasis 04/23/2013   Deformity of joint 04/23/2013   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   Chondromalacia patellae 12/02/2012  Tear of lateral meniscus of left knee 09/30/2012   Peripheral cyanosis 09/28/2012   DJD (degenerative joint disease) of knee 09/02/2012   Status post lumbar spinal fusion 10/02/2011   Routine health maintenance 08/17/2011   Anemia, iron deficiency 01/22/2011   Chronic back pain 01/02/2011   Pseudoarthrosis of lumbar spine 01/02/2011   Chronic pain 09/22/2010   Depression with anxiety 09/21/2010   Spinal stenosis 08/14/2010   Hypertensive chronic kidney disease 07/08/2010   Migraine 07/08/2010   Knee internal derangement 07/08/2010   Chest pain in adult 07/08/2010   Essential hypertension 07/08/2010   Derangement of knee, left 2009    Past Surgical History:  Procedure Laterality Date   APPENDECTOMY  1971   CERVICAL DISCECTOMY  1999   diskectomy with fixation:plate and screws   COLONOSCOPY     ESOPHAGOGASTRODUODENOSCOPY  12/2014   KNEE ARTHROSCOPY W/ MENISCAL REPAIR  09   left knee   L4-5 lumbar fusion  September 02, 2010   Dr. Hal Neer:  minimally invasive transforaminal interbody fusion with spacer   OVARIAN CYST SURGERY  1972   PATELLAR REEFING  '09   repair of fractured patella after fall   [place]  2007   lumbar spine   VENOUS ABLATION     for pain in the groin - VVTS did procedure. Has  some residual discomfort at the  ablation site.    WISDOM TOOTH EXTRACTION      OB History   No obstetric history on file.      Home Medications    Prior to Admission medications   Medication Sig Start Date End Date Taking? Authorizing Provider  amitriptyline (ELAVIL) 10 MG tablet Take 20 mg by mouth at bedtime.     [provider]  Apple Cid Vn-Grn Tea-Bit Or-Cr (APPLE CIDER VINEGAR PLUS) TABS Take 1 tablet by mouth daily.    [provider]  cholecalciferol (VITAMIN D) 1000 units tablet Take 1,000 Units by mouth 2 (two) times daily.     [provider]  ciclopirox (PENLAC) 8 % solution Apply topically at bedtime. Apply to nail/surrounding skin and daily over previous coat. Every 7 days remove with alcohol and continue. 04/20/20   Hyatt, Max T, DPM  clonazePAM (KLONOPIN) 1 MG tablet Take 1 tablet (1 mg total) by mouth at bedtime as needed for anxiety.    Norins, Heinz Knuckles, MD  esomeprazole (NEXIUM) 40 MG capsule Take 40 mg by mouth daily. 05/11/21   [provider]  fluticasone (FLONASE) 50 MCG/ACT nasal spray Place 1 spray into both nostrils daily as needed for allergies.     [provider]  gabapentin (NEURONTIN) 100 MG capsule Take 100 mg by mouth daily.    [provider]  Hyoscyamine Sulfate SL (LEVSIN/SL) 0.125 MG SUBL Place 0.125 mg under the tongue 4 (four) times daily. 04/12/16   Ivar Drape D, PA  levothyroxine (SYNTHROID) 88 MCG tablet Take 88 mcg by mouth every morning. 09/26/21   [provider]  meloxicam (MOBIC) 15 MG tablet Take 15 mg by mouth daily as needed for pain.    [provider]  metoprolol succinate (TOPROL-XL) 25 MG 24 hr tablet Take 1 tablet (25 mg total) by mouth 2 (two) times daily. 12/23/21   Richardo Priest, MD  Multiple Vitamins-Minerals (MULTIVITAMIN ADULT PO) Take 1 tablet by mouth daily.     [provider]  potassium chloride (KLOR-CON) 10 MEQ tablet TAKE 1 TABLET  IN THE  MORNING ON MONDAY, [place], AND FRIDAY. 11/09/21   Richardo Priest, MD  spironolactone (ALDACTONE) 25 MG tablet Take 1 tablet (25 mg total) by mouth daily as needed. 10/04/21   Richardo Priest, MD  sucralfate (CARAFATE) 1 G tablet Take 1 g by mouth daily. 09/03/14   [provider]  tiZANidine (ZANAFLEX) 2 MG tablet Take 2 mg by mouth 2 (two) times daily as needed. Reported on 04/05/2015    [provider]  Turmeric 500 MG CAPS Take 1,000 mg by mouth daily.    [provider]  zinc gluconate 50 MG tablet Take 50 mg by mouth daily.    [provider]    Family History Family History  Problem Relation Age of Onset   Diabetes Mother    COPD Mother    Aortic aneurysm Mother    Hypertension Mother    Hypothyroidism Mother    Cancer Father        lung   Hyperlipidemia Father    Hypertension Father    Heart disease Father    Diabetes Father    Heart attack Father    Diabetes Brother     Social History Social History   Tobacco Use   Smoking status: Never   Smokeless tobacco: Never  Vaping Use   Vaping Use: Never used  Substance Use Topics   Alcohol use: No   Drug use: No     Allergies   Epinephrine, Nsaids, Other, Pregabalin, Tolmetin, Ciprofloxacin, Parathyroid hormone (recomb), Prednisone, Tape, Tapentadol, Captopril, Lisinopril, Celecoxib, Codeine, Lidocaine hcl, Procaine, Sulfa antibiotics, and Sulfasalazine   Review of Systems Review of Systems Per HPI  Physical Exam Triage Vital Signs ED Triage Vitals  Enc Vitals Group     BP 02/12/22 1152 138/82     Pulse Rate 02/12/22 1152 71     Resp 02/12/22 1152 18     Temp 02/12/22 1152 97.8 F (36.6 C)     Temp src --      SpO2 02/12/22 1152 94 %     Weight --      Height --      Head Circumference --      Peak Flow --      Pain Score 02/12/22 1151 6     Pain Loc --      Pain Edu? --      Excl. in [place]? --    No data found.  Updated Vital Signs BP 138/82   Pulse 71   Temp  97.8 F (36.6 C)   Resp 18   SpO2 94%   Visual Acuity Right Eye Distance:   Left Eye Distance:   Bilateral Distance:    Right Eye Near:   Left Eye Near:    Bilateral Near:     Physical Exam Constitutional:      General: She is not in acute distress.    Appearance: Normal appearance. She is not toxic-appearing or diaphoretic.  HENT:     Head: Normocephalic and atraumatic.     Right Ear: Tympanic membrane and ear canal normal.     Left Ear: Tympanic membrane and ear canal normal.     Nose: Congestion present.     Mouth/Throat:     Mouth: Mucous membranes are moist.     Pharynx: No posterior oropharyngeal erythema.  Eyes:     Extraocular Movements: Extraocular movements intact.     Conjunctiva/sclera: Conjunctivae normal.     Pupils: Pupils are equal,  round, and reactive to light.  Cardiovascular:     Rate and Rhythm: Normal rate and regular rhythm.     Pulses: Normal pulses.     Heart sounds: Normal heart sounds.  Pulmonary:     Effort: Pulmonary effort is normal. No respiratory distress.     Breath sounds: Normal breath sounds. No stridor. No wheezing, rhonchi or rales.  Abdominal:     General: Abdomen is flat. Bowel sounds are normal. There is no distension.     Palpations: Abdomen is soft.     Tenderness: There is no abdominal tenderness.  Musculoskeletal:        General: Normal range of motion.     Cervical back: Normal range of motion.     Comments: Patient wearing a back brace.   Skin:    General: Skin is warm and dry.  Neurological:     General: No focal deficit present.     Mental Status: She is alert and oriented to person, place, and time. Mental status is at baseline.     Deep Tendon Reflexes: Reflexes are normal and symmetric.  Psychiatric:        Mood and Affect: Mood normal.        Behavior: Behavior normal.      UC Treatments / Results  Labs (all labs ordered are listed, but only abnormal results are displayed) Labs Reviewed  SARS CORONAVIRUS  2 (TAT 6-24 HRS)  GASTROINTESTINAL PANEL BY PCR, STOOL (REPLACES STOOL CULTURE)  C DIFFICILE QUICK SCREEN W PCR REFLEX    CBC  COMPREHENSIVE METABOLIC PANEL    EKG   Radiology No results found.  Procedures Procedures (including critical care time)  Medications Ordered in UC Medications - No data to display  Initial Impression / Assessment and Plan / UC Course  I have reviewed the triage vital signs and the nursing notes.  Pertinent labs & imaging results that were available during my care of the patient were reviewed by me and considered in my medical decision making (see chart for details).     Patient presents with symptoms likely from a viral upper respiratory infection. Differential includes bacterial pneumonia, sinusitis, allergic rhinitis, COVID-19, flu, RSV. Do not suspect underlying cardiopulmonary process. Symptoms seem unlikely related to ACS, CHF or COPD exacerbations, pneumonia, pneumothorax. Patient is nontoxic appearing and not in need of emergent medical intervention.  COVID testing pending. Recommended symptom control with over the counter medications.  Suspect diarrhea is related to viral illness but given patient's close exposure to C. difficile and recent hospitalization, will send stool sample off for testing.  CMP and CBC also pending.  Encouraged to follow-up with PCP for further evaluation and management as well.  No concerns for acute abdomen or dehydration on exam so do not think that emergent evaluation is necessary.  Return if symptoms fail to improve.  Patient given strict return and ER precautions.  Patient states understanding and is agreeable.  Discharged with PCP followup.  Final Clinical Impressions(s) / UC Diagnoses   Final diagnoses:  Viral upper respiratory tract infection with cough  Diarrhea, unspecified type     Discharge Instructions      You have a viral upper respiratory infection which should run its course and self resolve with  symptomatic treatment.  COVID test is pending.  We will call if it is positive.  I have sent your stool sample and blood work off for testing.  We will call if it is abnormal.  Recommend bland  diet.  See attached instructions.  Also recommend ensuring adequate clear oral fluid intake as well.  I recommend that you follow-up with your primary care doctor for further evaluation as well.    ED Prescriptions   None    PDMP not reviewed this encounter.   Teodora Medici, [place] 02/12/22 [phone]

## 2022-02-12 NOTE — Discharge Instructions (Signed)
You have a viral upper respiratory infection which should run its course and self resolve with symptomatic treatment.  COVID test is pending.  We will call if it is positive.  I have sent your stool sample and blood work off for testing.  We will call if it is abnormal.  Recommend bland diet.  See attached instructions.  Also recommend ensuring adequate clear oral fluid intake as well.  I recommend that you follow-up with your primary care doctor for further evaluation as well.

## 2022-02-12 NOTE — ED Triage Notes (Signed)
Pt is present today with cough and congestion x2 days ago.   Pt also has diarrhea and abdominal pain x1 week

## 2022-02-13 LAB — GASTROINTESTINAL PANEL BY PCR, STOOL (REPLACES STOOL CULTURE)

## 2022-02-13 LAB — SARS CORONAVIRUS 2 (TAT 6-24 HRS): SARS Coronavirus 2: NEGATIVE

## 2022-02-14 ENCOUNTER — Telehealth: Payer: Self-pay | Admitting: Cardiology

## 2022-02-14 DIAGNOSIS — R748 Abnormal levels of other serum enzymes: Secondary | ICD-10-CM | POA: Diagnosis not present

## 2022-02-14 DIAGNOSIS — E875 Hyperkalemia: Secondary | ICD-10-CM | POA: Diagnosis not present

## 2022-02-14 DIAGNOSIS — M7989 Other specified soft tissue disorders: Secondary | ICD-10-CM | POA: Diagnosis not present

## 2022-02-14 DIAGNOSIS — R197 Diarrhea, unspecified: Secondary | ICD-10-CM | POA: Diagnosis not present

## 2022-02-14 LAB — COMPREHENSIVE METABOLIC PANEL
ALT: 52 IU/L — ABNORMAL HIGH (ref 0–32)
AST: 77 IU/L — ABNORMAL HIGH (ref 0–40)
Albumin/Globulin Ratio: 1.4 (ref 1.2–2.2)
Albumin: 4.4 g/dL (ref 3.9–4.9)
Alkaline Phosphatase: 126 IU/L — ABNORMAL HIGH (ref 44–121)
BUN/Creatinine Ratio: 14 (ref 12–28)
BUN: 12 mg/dL (ref 8–27)
Bilirubin Total: 0.2 mg/dL (ref 0.0–1.2)
CO2: 23 mmol/L (ref 20–29)
Calcium: 9.8 mg/dL (ref 8.7–10.3)
Chloride: 101 mmol/L (ref 96–106)
Creatinine, Ser: 0.86 mg/dL (ref 0.57–1.00)
Globulin, Total: 3.2 g/dL (ref 1.5–4.5)
Glucose: 98 mg/dL (ref 70–99)
Potassium: 5.6 mmol/L — ABNORMAL HIGH (ref 3.5–5.2)
Sodium: 139 mmol/L (ref 134–144)
Total Protein: 7.6 g/dL (ref 6.0–8.5)
eGFR: 73 mL/min/{1.73_m2} (ref >59)

## 2022-02-14 LAB — CBC
Hematocrit: 43.2 % (ref 34.0–46.6)
Hemoglobin: 14 g/dL (ref 11.1–15.9)
MCH: 28.5 pg (ref 26.6–33.0)
MCHC: 32.4 g/dL (ref 31.5–35.7)
MCV: 88 fL (ref 79–97)
Platelets: 336 10*3/uL (ref 150–450)
RBC: 4.91 x10E6/uL (ref 3.77–5.28)
RDW: 13.2 % (ref 11.7–15.4)
WBC: 9.1 10*3/uL (ref 3.4–10.8)

## 2022-02-14 MED ORDER — FUROSEMIDE 20 MG PO TABS: ORAL_TABLET | ORAL | 3 refills | Status: DC

## 2022-02-14 NOTE — Telephone Encounter (Signed)
Pt states that she has been having increased swelling in her lower extremities and her potassium is elevated. BP has been elevated some at home 180/80 per PT.

## 2022-02-14 NOTE — Telephone Encounter (Signed)
Recommendations reviewed with pt as per Dr. Munley's note.  Pt verbalized understanding and had no additional questions.  

## 2022-02-14 NOTE — Addendum Note (Signed)
Addended by: Truddie Hidden on: 02/14/2022 12:58 PM   Modules accepted: Orders

## 2022-02-14 NOTE — Telephone Encounter (Signed)
Pt c/o swelling: STAT is pt has developed SOB within 24 hours  If swelling, where is the swelling located? Legs, ankles and feet   How much weight have you gained and in what time span? no  Have you gained 3 pounds in a day or 5 pounds in a week? no  Do you have a log of your daily weights (if so, list)? no  Are you currently taking a fluid pill? Yes   Are you currently SOB? No, comes and goes   Have you traveled recently? No    Pt states she was recently seen in the ED recently, notes are in Epic, she states her potassium level was critical. She has experienced some SOB but its comes and goes.

## 2022-02-20 DIAGNOSIS — I129 Hypertensive chronic kidney disease with stage 1 through stage 4 chronic kidney disease, or unspecified chronic kidney disease: Secondary | ICD-10-CM | POA: Diagnosis not present

## 2022-02-20 DIAGNOSIS — N189 Chronic kidney disease, unspecified: Secondary | ICD-10-CM | POA: Diagnosis not present

## 2022-02-20 DIAGNOSIS — I872 Venous insufficiency (chronic) (peripheral): Secondary | ICD-10-CM | POA: Diagnosis not present

## 2022-02-20 DIAGNOSIS — I890 Lymphedema, not elsewhere classified: Secondary | ICD-10-CM | POA: Diagnosis not present

## 2022-02-20 DIAGNOSIS — M5146 Schmorl's nodes, lumbar region: Secondary | ICD-10-CM | POA: Diagnosis not present

## 2022-02-20 DIAGNOSIS — Z4789 Encounter for other orthopedic aftercare: Secondary | ICD-10-CM | POA: Diagnosis not present

## 2022-02-21 DIAGNOSIS — Z01419 Encounter for gynecological examination (general) (routine) without abnormal findings: Secondary | ICD-10-CM | POA: Diagnosis not present

## 2022-03-02 DIAGNOSIS — N189 Chronic kidney disease, unspecified: Secondary | ICD-10-CM | POA: Diagnosis not present

## 2022-03-02 DIAGNOSIS — I872 Venous insufficiency (chronic) (peripheral): Secondary | ICD-10-CM | POA: Diagnosis not present

## 2022-03-02 DIAGNOSIS — M5146 Schmorl's nodes, lumbar region: Secondary | ICD-10-CM | POA: Diagnosis not present

## 2022-03-02 DIAGNOSIS — I890 Lymphedema, not elsewhere classified: Secondary | ICD-10-CM | POA: Diagnosis not present

## 2022-03-02 DIAGNOSIS — G629 Polyneuropathy, unspecified: Secondary | ICD-10-CM | POA: Diagnosis not present

## 2022-03-02 DIAGNOSIS — I491 Atrial premature depolarization: Secondary | ICD-10-CM | POA: Diagnosis not present

## 2022-03-02 DIAGNOSIS — N182 Chronic kidney disease, stage 2 (mild): Secondary | ICD-10-CM | POA: Diagnosis not present

## 2022-03-02 DIAGNOSIS — Z4789 Encounter for other orthopedic aftercare: Secondary | ICD-10-CM | POA: Diagnosis not present

## 2022-03-02 DIAGNOSIS — Z0000 Encounter for general adult medical examination without abnormal findings: Secondary | ICD-10-CM | POA: Diagnosis not present

## 2022-03-02 DIAGNOSIS — K219 Gastro-esophageal reflux disease without esophagitis: Secondary | ICD-10-CM | POA: Diagnosis not present

## 2022-03-02 DIAGNOSIS — F419 Anxiety disorder, unspecified: Secondary | ICD-10-CM | POA: Diagnosis not present

## 2022-03-02 DIAGNOSIS — E039 Hypothyroidism, unspecified: Secondary | ICD-10-CM | POA: Diagnosis not present

## 2022-03-02 DIAGNOSIS — Z86006 Personal history of melanoma in-situ: Secondary | ICD-10-CM | POA: Diagnosis not present

## 2022-03-02 DIAGNOSIS — I129 Hypertensive chronic kidney disease with stage 1 through stage 4 chronic kidney disease, or unspecified chronic kidney disease: Secondary | ICD-10-CM | POA: Diagnosis not present

## 2022-03-02 DIAGNOSIS — I700 Atherosclerosis of aorta: Secondary | ICD-10-CM | POA: Diagnosis not present

## 2022-03-02 DIAGNOSIS — G500 Trigeminal neuralgia: Secondary | ICD-10-CM | POA: Diagnosis not present

## 2022-03-09 DIAGNOSIS — N189 Chronic kidney disease, unspecified: Secondary | ICD-10-CM | POA: Diagnosis not present

## 2022-03-09 DIAGNOSIS — M5146 Schmorl's nodes, lumbar region: Secondary | ICD-10-CM | POA: Diagnosis not present

## 2022-03-09 DIAGNOSIS — I129 Hypertensive chronic kidney disease with stage 1 through stage 4 chronic kidney disease, or unspecified chronic kidney disease: Secondary | ICD-10-CM | POA: Diagnosis not present

## 2022-03-09 DIAGNOSIS — Z4789 Encounter for other orthopedic aftercare: Secondary | ICD-10-CM | POA: Diagnosis not present

## 2022-03-09 DIAGNOSIS — I872 Venous insufficiency (chronic) (peripheral): Secondary | ICD-10-CM | POA: Diagnosis not present

## 2022-03-09 DIAGNOSIS — I890 Lymphedema, not elsewhere classified: Secondary | ICD-10-CM | POA: Diagnosis not present

## 2022-03-16 DIAGNOSIS — G894 Chronic pain syndrome: Secondary | ICD-10-CM | POA: Diagnosis not present

## 2022-03-16 DIAGNOSIS — Z79899 Other long term (current) drug therapy: Secondary | ICD-10-CM | POA: Diagnosis not present

## 2022-03-16 DIAGNOSIS — Z79891 Long term (current) use of opiate analgesic: Secondary | ICD-10-CM | POA: Diagnosis not present

## 2022-04-03 ENCOUNTER — Ambulatory Visit: Payer: Medicare Other | Attending: Orthopaedic Surgery | Admitting: Physical Therapy

## 2022-04-03 ENCOUNTER — Encounter: Payer: Self-pay | Admitting: Physical Therapy

## 2022-04-03 DIAGNOSIS — M5459 Other low back pain: Secondary | ICD-10-CM | POA: Insufficient documentation

## 2022-04-03 DIAGNOSIS — R262 Difficulty in walking, not elsewhere classified: Secondary | ICD-10-CM | POA: Insufficient documentation

## 2022-04-03 DIAGNOSIS — M6281 Muscle weakness (generalized): Secondary | ICD-10-CM | POA: Diagnosis not present

## 2022-04-03 DIAGNOSIS — M62830 Muscle spasm of back: Secondary | ICD-10-CM | POA: Diagnosis not present

## 2022-04-03 NOTE — Therapy (Signed)
OUTPATIENT PHYSICAL THERAPY THORACOLUMBAR EVALUATION   Patient Name: [name] MRN: [id number] DOB:1950/05/23, 72 y.o., female Today's Date: 04/03/2022  END OF SESSION:  PT End of Session - 04/03/22 1531     Visit Number 1    Date for PT Re-Evaluation 07/03/22    Authorization Type [employer]    PT Start Time 1525    PT Stop Time 1612    PT Time Calculation (min) 47 min    Activity Tolerance Patient tolerated treatment well    Behavior During Therapy [facility] for tasks assessed/performed             Past Medical History:  Diagnosis Date   Allergy    Anemia, iron deficiency 01/22/2011   May '12 - 13.8 Hgb, Nov 2, '12 Hgb 10.9    APC (atrial premature contractions) 02/17/2018   Arthritis    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   CAP (community acquired pneumonia) 10/21/2016   Chest pain in adult 07/08/2010   March 5, '09 negative cardiolite Sept 28, '10 negative nuclear stress (cardiolite) Dec 21, '11  Negative nuclear stress Brantley Fling) June 26, '14 Negative nuclear stress - [place] with Dr. Bettina Gavia per patient report.  Overview:  CAD risk score is low at 3%   Chondromalacia patellae 12/02/2012   Chronic back pain    Chronic kidney disease    Chronic knee pain    Chronic pain 09/22/2010   Multiple sources of pain: radicular pain from lumbar disease and post-operative pain; neck pain from cervical disk disease; left knee pain from trauma.  Plan - will wean off prednisone           Will continue percocet at 1  5/325 norco every 6 hours prn           NSAID - trial of meloxicam 50mg once a day           Increase gabapentin slowly to '600mg'$  tid and reassess           Executed a pain con   Chronic venous insufficiency 10/10/2013   Colitis 10/21/2016   Collagen vascular disease ([place]    Deformity of joint 04/23/2013   Depression with anxiety 09/21/2010   Derangement of knee, left 2009   started with fall at work. Has had repair patella and torn meniscus   DJD (degenerative  joint disease) of knee 09/02/2012   Essential hypertension 07/08/2010   Long standing hypertension medically managed. Currently seeing Dr. HDaneen Schickand Dr. MMarin Roberts([facility]) for cardiology.  Meds  HCT only  Overview:  Overview:  Long standing hypertension medically managed. Currently seeing Dr. HDaneen Schickand Dr. MBettina Gavia([facility]) for cardiology.  Meds  HCT only  Last Assessment & Plan:  Formatting of this note may be different from the original. BP Readings   Fever chills 10/21/2016   Fibromyalgia 02/27/2017   High cholesterol    Hx of appendectomy    Hypertension    Hypertensive chronic kidney disease 07/08/2010   Long standing hypertension medically managed. Currently seeing Dr. HDaneen Schickand Dr. MMarin Roberts([facility]) for cardiology.  Meds  HCT only  Overview:  Overview:  Long standing hypertension medically managed. Currently seeing Dr. HDaneen Schickand Dr. MBettina Gavia([facility]) for cardiology.  Meds  HCT only  Last Assessment & Plan:  Formatting of this note may be different from the original. BP Readings   Knee internal derangement 07/08/2010   Left knee: Feb '09 medial and lateral meniscal tears by MRI  April '09 arthroscopic surgery    Melanoma ([place]) 11/19/2015   Migraine headache    hormonal related - less frequent off hormone replacement   Migraines    Neuropathy 04/23/2013   PAT (paroxysmal atrial tachycardia) 02/17/2018   Peripheral cyanosis 09/28/2012   Full evaluation by Dr. Tobie Lords for rheumatology - no evidence of Raynaud's syndrome or disease; no evidence of underlying rheumatologic disease or malignancy. Most probable cause is neurogenic.    Pseudoarthrosis of lumbar spine 01/02/2011   Psoriasis 04/23/2013   Raynaud disease    Routine health maintenance 08/17/2011   Colonoscopy - no record in EPIC of colonoscopy  Immunizations - Tdap June '13.     Salmonella enteritis 10/23/2016   Sepsis ([place]) 10/23/2016   Spinal stenosis 08/14/2010   history of cervical  laminectomy, lumbar diskectomy with fixation devices and redo surgery.    Status post lumbar spinal fusion 10/02/2011   SVT (supraventricular tachycardia)    Synovial cyst of lumbar facet joint    lower back - s/p surgery    Tear of lateral meniscus of left knee 09/30/2012   Past Surgical History:  Procedure Laterality Date   APPENDECTOMY  1971   CERVICAL DISCECTOMY  1999   diskectomy with fixation:plate and screws   COLONOSCOPY     ESOPHAGOGASTRODUODENOSCOPY  12/2014   KNEE ARTHROSCOPY W/ MENISCAL REPAIR  09   left knee   L4-5 lumbar fusion  September 02, 2010   Dr. Hal Neer:  minimally invasive transforaminal interbody fusion with spacer   OVARIAN CYST SURGERY  1972   PATELLAR REEFING  '09   repair of fractured patella after fall   [place]  2007   lumbar spine   VENOUS ABLATION     for pain in the groin - VVTS did procedure. Has some residual discomfort at the  ablation site.    WISDOM TOOTH EXTRACTION     Patient Active Problem List   Diagnosis Date Noted   Synovial cyst of lumbar facet joint    Raynaud disease    Migraines    Migraine headache    Hypertension    Hx of appendectomy    High cholesterol    Collagen vascular disease ([place])    Chronic knee pain    Chronic kidney disease    Allergy    Arthritis    Chronic right-sided low back pain with right-sided sciatica 12/12/2018   Left-sided trigeminal neuralgia 06/03/2018   Trigeminal neuralgia of left side of face 04/30/2018   APC (atrial premature contractions) 02/17/2018   PAT (paroxysmal atrial tachycardia) 02/17/2018   Palpitations 12/24/2017   Angular cheilitis 05/10/2017   Dysphonia 05/10/2017   Laryngopharyngeal reflux (LPR) 05/10/2017   Mucous retention cyst of maxillary sinus 05/10/2017   Recurrent epistaxis 05/10/2017   Upper airway cough syndrome 04/26/2017   Fibromyalgia 02/27/2017   Sepsis ([place]) 10/23/2016   Salmonella enteritis 10/23/2016   Colitis 10/21/2016   CAP  (community acquired pneumonia) 10/21/2016   Fever chills 10/21/2016   Melanoma ([place]) 11/19/2015   SVT (supraventricular tachycardia) 05/20/2015   Chronic venous insufficiency 10/10/2013   Neuropathy 04/23/2013   Psoriasis 04/23/2013   Deformity of joint 04/23/2013   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   Chondromalacia patellae 12/02/2012   Tear of lateral meniscus of left knee 09/30/2012   Peripheral cyanosis 09/28/2012   DJD (degenerative joint disease) of knee 09/02/2012   Status post lumbar spinal fusion 10/02/2011   Routine health maintenance 08/17/2011  Anemia, iron deficiency 01/22/2011   Chronic back pain 01/02/2011   Pseudoarthrosis of lumbar spine 01/02/2011   Chronic pain 09/22/2010   Depression with anxiety 09/21/2010   Spinal stenosis 08/14/2010   Hypertensive chronic kidney disease 07/08/2010   Migraine 07/08/2010   Knee internal derangement 07/08/2010   Chest pain in adult 07/08/2010   Essential hypertension 07/08/2010   Derangement of knee, left 2009    PCP: Doristine Bosworth, MD  REFERRING PROVIDER: Patrice Paradise, MD  REFERRING DIAG: S/P Lumbar fusion L1-S1  Rationale for Evaluation and Treatment: Rehabilitation  THERAPY DIAG:  Other low back pain  Muscle spasm of back  Muscle weakness (generalized)  Difficulty in walking, not elsewhere classified  ONSET DATE: 01/02/22  SUBJECTIVE:                                                                                                                                                                                           SUBJECTIVE STATEMENT: January 02, 2022, this was the 5th and 6th surgery on the lumbar area, she was fused L1-S1, she was in the hospital 2 days, she had PT and OT at home.  Reports that she has left knee pain and bilateral feet numbness.  PERTINENT HISTORY:  See above  PAIN:  Are you having pain? Yes: NPRS scale: 5/10 Pain location: left low back Pain description: sharp, ache Aggravating  factors: standing, leaning over, longer periods od activity, worse end of day pain up to 10/10 Relieving factors: ice, heat, pain meds 5/10 at best  PRECAUTIONS: Back and Other: no lifting > 10#, be careful with bending and twisting  WEIGHT BEARING RESTRICTIONS: No  FALLS:  Has patient fallen in last 6 months? No  LIVING ENVIRONMENT: Lives with: lives with their family and lives with their spouse Lives in: House/apartment Stairs: No Has following equipment at home: Single point cane  OCCUPATION: retired  PLOF: Independent and gardens  PATIENT GOALS: garden, have left pain  NEXT MD VISIT:   OBJECTIVE:    PATIENT SURVEYS:  FOTO 21  SCREENING FOR RED FLAGS: Bowel or bladder incontinence: No Spinal tumors: No Cauda equina syndrome: No Compression fracture: No Abdominal aneurysm: No  COGNITION: Overall cognitive status: Within functional limits for tasks assessed     SENSATION: Reports neuropathy in her feet  MUSCLE LENGTH: Very tight HS and piriformis POSTURE: rounded shoulders and forward head  PALPATION: Tight and tender in the lumbar area and into the buttocks  Uses a rail for bed mobility  LUMBAR ROM:   AROM eval  Flexion Decreased 75%  Extension Decreased 75%  Right lateral flexion Decreased 75%  Left lateral flexion  Decreased 75%  Right rotation   Left rotation    (Blank rows = not tested)  LOWER EXTREMITY ROM:     Active  Right eval Left eval  Hip flexion 70 45  Hip extension    Hip abduction    Hip adduction    Hip internal rotation    Hip external rotation    Knee flexion    Knee extension    Ankle dorsiflexion    Ankle plantarflexion    Ankle inversion    Ankle eversion     (Blank rows = not tested)  LOWER EXTREMITY MMT:    MMT Right eval Left eval  Hip flexion 4 4-  Hip extension    Hip abduction 4 4  Hip adduction    Hip internal rotation    Hip external rotation    Knee flexion 4- 4-  Knee extension 4 4  Ankle  dorsiflexion    Ankle plantarflexion    Ankle inversion    Ankle eversion     (Blank rows = not tested)  FUNCTIONAL TESTS:  5 times sit to stand: 32 seconds Timed up and go (TUG): 21 seconds  GAIT: Distance walked: 60 feet Assistive device utilized: None Level of assistance: Complete Independence Comments: slow, unsteady, limp on the left  TODAY'S TREATMENT:                                                                                                                              DATE:     PATIENT EDUCATION:  Education details: POC and HEP Person educated: Patient Education method: Consulting civil engineer, Media planner, and Handouts Education comprehension: verbalized understanding  HOME EXERCISE PROGRAM: Access Code: [email or web address] URL: [email or web address] Date: 04/03/2022 Prepared by: Lum Babe  Exercises - Supine Bridge  - 1 x daily - 7 x weekly - 2 sets - 10 reps - 3 hold - Supine Posterior Pelvic Tilt  - 1 x daily - 7 x weekly - 2 sets - 10 reps - 3 hold - Hooklying Single Knee to Chest Stretch  - 1 x daily - 7 x weekly - 2 sets - 10 reps - 3 hold - Standing Bilateral Low Shoulder Row with Anchored Resistance  - 1 x daily - 7 x weekly - 3 sets - 10 reps - 3 hold - Shoulder extension with resistance - Neutral  - 1 x daily - 7 x weekly - 3 sets - 10 reps - 3 hold  ASSESSMENT:  CLINICAL IMPRESSION: Patient is a 72 y.o. female who was seen today for physical therapy evaluation and treatment for s/p lumbar fusion has had 5-6 surgeries, she reports unsteady on feet with neuropathy.  She has some weakness and tightness in the LE's, she is very guarded in the lumbar mms, reports that she has difficulty putting shoes and socks on and cannot do housework.  She does seem to have some safety awareness issues as she is using an exercise ball at  home with her sitting on it and is doing some gardening  OBJECTIVE IMPAIRMENTS: Abnormal gait, cardiopulmonary status limiting  activity, decreased activity tolerance, decreased balance, decreased endurance, decreased mobility, difficulty walking, decreased ROM, decreased strength, decreased safety awareness, increased muscle spasms, impaired flexibility, improper body mechanics, postural dysfunction, and pain.     REHAB POTENTIAL: Good  CLINICAL DECISION MAKING: Evolving/moderate complexity  EVALUATION COMPLEXITY: Low   GOALS: Goals reviewed with patient? Yes  SHORT TERM GOALS: Target date: 04/17/22  Independent with initial HEP Goal status: INITIAL LONG TERM GOALS: Target date: 07/03/22  Independent with advanced HEP Baseline:  Goal status: INITIAL  2.  Decrease TUG to 13 seconds Baseline: 22 seconds Goal status: INITIAL  3.  Be able to put shoes and socks on without difficulty  Baseline:  Goal status: INITIAL  4.  Increase LE strength to 4+/5 Baseline:  Goal status: INITIAL  5.  Be able to garden 1 hour without pain >5/10 Baseline:  Goal status: INITIAL  PLAN:  PT FREQUENCY: 2x/week  PT DURATION: 12 weeks  PLANNED INTERVENTIONS: Therapeutic exercises, Therapeutic activity, Neuromuscular re-education, Balance training, Gait training, Patient/Family education, Self Care, Joint mobilization, Dry Needling, Electrical stimulation, Cryotherapy, Moist heat, Manual therapy, and Re-evaluation.  PLAN FOR NEXT SESSION: start gym slowly, needs posture and body mechanics education and safety   Sumner Boast, PT 04/03/2022, 3:32 PM

## 2022-04-07 DIAGNOSIS — R21 Rash and other nonspecific skin eruption: Secondary | ICD-10-CM | POA: Diagnosis not present

## 2022-04-11 ENCOUNTER — Ambulatory Visit: Payer: Medicare Other | Admitting: Physical Therapy

## 2022-04-11 ENCOUNTER — Encounter: Payer: Self-pay | Admitting: Physical Therapy

## 2022-04-11 DIAGNOSIS — M62830 Muscle spasm of back: Secondary | ICD-10-CM

## 2022-04-11 DIAGNOSIS — M6281 Muscle weakness (generalized): Secondary | ICD-10-CM | POA: Diagnosis not present

## 2022-04-11 DIAGNOSIS — R262 Difficulty in walking, not elsewhere classified: Secondary | ICD-10-CM | POA: Diagnosis not present

## 2022-04-11 DIAGNOSIS — M5459 Other low back pain: Secondary | ICD-10-CM | POA: Diagnosis not present

## 2022-04-11 NOTE — Therapy (Signed)
OUTPATIENT PHYSICAL THERAPY THORACOLUMBAR EVALUATION   Patient Name: [name] MRN: [id number] DOB:08/24/1950, 72 y.o., female Today's Date: 04/11/2022  END OF SESSION:  PT End of Session - 04/11/22 1257     Visit Number 2    Date for PT Re-Evaluation 07/03/22    Authorization Type [employer]    PT Start Time 1258    PT Stop Time 1345    PT Time Calculation (min) 47 min    Activity Tolerance Patient tolerated treatment well    Behavior During Therapy [facility] for tasks assessed/performed             Past Medical History:  Diagnosis Date   Allergy    Anemia, iron deficiency 01/22/2011   May '12 - 13.8 Hgb, Nov 2, '12 Hgb 10.9    APC (atrial premature contractions) 02/17/2018   Arthritis    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   CAP (community acquired pneumonia) 10/21/2016   Chest pain in adult 07/08/2010   March 5, '09 negative cardiolite Sept 28, '10 negative nuclear stress (cardiolite) Dec 21, '11  Negative nuclear stress Brantley Fling) June 26, '14 Negative nuclear stress - [place] with Dr. Bettina Gavia per patient report.  Overview:  CAD risk score is low at 3%   Chondromalacia patellae 12/02/2012   Chronic back pain    Chronic kidney disease    Chronic knee pain    Chronic pain 09/22/2010   Multiple sources of pain: radicular pain from lumbar disease and post-operative pain; neck pain from cervical disk disease; left knee pain from trauma.  Plan - will wean off prednisone           Will continue percocet at 1  5/325 norco every 6 hours prn           NSAID - trial of meloxicam 29mg once a day           Increase gabapentin slowly to '600mg'$  tid and reassess           Executed a pain con   Chronic venous insufficiency 10/10/2013   Colitis 10/21/2016   Collagen vascular disease ([place]    Deformity of joint 04/23/2013   Depression with anxiety 09/21/2010   Derangement of knee, left 2009   started with fall at work. Has had repair patella and torn meniscus   DJD (degenerative  joint disease) of knee 09/02/2012   Essential hypertension 07/08/2010   Long standing hypertension medically managed. Currently seeing Dr. HDaneen Schickand Dr. MMarin Roberts([facility]) for cardiology.  Meds  HCT only  Overview:  Overview:  Long standing hypertension medically managed. Currently seeing Dr. HDaneen Schickand Dr. MBettina Gavia([facility]) for cardiology.  Meds  HCT only  Last Assessment & Plan:  Formatting of this note may be different from the original. BP Readings   Fever chills 10/21/2016   Fibromyalgia 02/27/2017   High cholesterol    Hx of appendectomy    Hypertension    Hypertensive chronic kidney disease 07/08/2010   Long standing hypertension medically managed. Currently seeing Dr. HDaneen Schickand Dr. MMarin Roberts([facility]) for cardiology.  Meds  HCT only  Overview:  Overview:  Long standing hypertension medically managed. Currently seeing Dr. HDaneen Schickand Dr. MBettina Gavia([facility]) for cardiology.  Meds  HCT only  Last Assessment & Plan:  Formatting of this note may be different from the original. BP Readings   Knee internal derangement 07/08/2010   Left knee: Feb '09 medial and lateral meniscal tears by MRI  April '09 arthroscopic surgery    Melanoma ([place]) 11/19/2015   Migraine headache    hormonal related - less frequent off hormone replacement   Migraines    Neuropathy 04/23/2013   PAT (paroxysmal atrial tachycardia) 02/17/2018   Peripheral cyanosis 09/28/2012   Full evaluation by Dr. Tobie Lords for rheumatology - no evidence of Raynaud's syndrome or disease; no evidence of underlying rheumatologic disease or malignancy. Most probable cause is neurogenic.    Pseudoarthrosis of lumbar spine 01/02/2011   Psoriasis 04/23/2013   Raynaud disease    Routine health maintenance 08/17/2011   Colonoscopy - no record in EPIC of colonoscopy  Immunizations - Tdap June '13.     Salmonella enteritis 10/23/2016   Sepsis ([place]) 10/23/2016   Spinal stenosis 08/14/2010   history of cervical  laminectomy, lumbar diskectomy with fixation devices and redo surgery.    Status post lumbar spinal fusion 10/02/2011   SVT (supraventricular tachycardia)    Synovial cyst of lumbar facet joint    lower back - s/p surgery    Tear of lateral meniscus of left knee 09/30/2012   Past Surgical History:  Procedure Laterality Date   APPENDECTOMY  1971   CERVICAL DISCECTOMY  1999   diskectomy with fixation:plate and screws   COLONOSCOPY     ESOPHAGOGASTRODUODENOSCOPY  12/2014   KNEE ARTHROSCOPY W/ MENISCAL REPAIR  09   left knee   L4-5 lumbar fusion  September 02, 2010   Dr. Hal Neer:  minimally invasive transforaminal interbody fusion with spacer   OVARIAN CYST SURGERY  1972   PATELLAR REEFING  '09   repair of fractured patella after fall   [place]  2007   lumbar spine   VENOUS ABLATION     for pain in the groin - VVTS did procedure. Has some residual discomfort at the  ablation site.    WISDOM TOOTH EXTRACTION     Patient Active Problem List   Diagnosis Date Noted   Synovial cyst of lumbar facet joint    Raynaud disease    Migraines    Migraine headache    Hypertension    Hx of appendectomy    High cholesterol    Collagen vascular disease ([place])    Chronic knee pain    Chronic kidney disease    Allergy    Arthritis    Chronic right-sided low back pain with right-sided sciatica 12/12/2018   Left-sided trigeminal neuralgia 06/03/2018   Trigeminal neuralgia of left side of face 04/30/2018   APC (atrial premature contractions) 02/17/2018   PAT (paroxysmal atrial tachycardia) 02/17/2018   Palpitations 12/24/2017   Angular cheilitis 05/10/2017   Dysphonia 05/10/2017   Laryngopharyngeal reflux (LPR) 05/10/2017   Mucous retention cyst of maxillary sinus 05/10/2017   Recurrent epistaxis 05/10/2017   Upper airway cough syndrome 04/26/2017   Fibromyalgia 02/27/2017   Sepsis ([place]) 10/23/2016   Salmonella enteritis 10/23/2016   Colitis 10/21/2016   CAP  (community acquired pneumonia) 10/21/2016   Fever chills 10/21/2016   Melanoma ([place]) 11/19/2015   SVT (supraventricular tachycardia) 05/20/2015   Chronic venous insufficiency 10/10/2013   Neuropathy 04/23/2013   Psoriasis 04/23/2013   Deformity of joint 04/23/2013   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   Chondromalacia patellae 12/02/2012   Tear of lateral meniscus of left knee 09/30/2012   Peripheral cyanosis 09/28/2012   DJD (degenerative joint disease) of knee 09/02/2012   Status post lumbar spinal fusion 10/02/2011   Routine health maintenance 08/17/2011  Anemia, iron deficiency 01/22/2011   Chronic back pain 01/02/2011   Pseudoarthrosis of lumbar spine 01/02/2011   Chronic pain 09/22/2010   Depression with anxiety 09/21/2010   Spinal stenosis 08/14/2010   Hypertensive chronic kidney disease 07/08/2010   Migraine 07/08/2010   Knee internal derangement 07/08/2010   Chest pain in adult 07/08/2010   Essential hypertension 07/08/2010   Derangement of knee, left 2009    PCP: Doristine Bosworth, MD  REFERRING PROVIDER: Patrice Paradise, MD  REFERRING DIAG: S/P Lumbar fusion L1-S1  Rationale for Evaluation and Treatment: Rehabilitation  THERAPY DIAG:  Other low back pain  Muscle spasm of back  Muscle weakness (generalized)  Difficulty in walking, not elsewhere classified  ONSET DATE: 01/02/22  SUBJECTIVE:                                                                                                                                                                                           SUBJECTIVE STATEMENT: Patient reports that she has been doing some stuff around the house and sometimes is bent over and she is stiff and sore in the low back  PERTINENT HISTORY:  See above  PAIN:  Are you having pain? Yes: NPRS scale: 4/10 Pain location: left low back Pain description: sharp, ache Aggravating factors: standing, leaning over, longer periods od activity, worse end of day  pain up to 10/10 Relieving factors: ice, heat, pain meds 5/10 at best  PRECAUTIONS: Back and Other: no lifting > 10#, be careful with bending and twisting  WEIGHT BEARING RESTRICTIONS: No  FALLS:  Has patient fallen in last 6 months? No  LIVING ENVIRONMENT: Lives with: lives with their family and lives with their spouse Lives in: House/apartment Stairs: No Has following equipment at home: Single point cane  OCCUPATION: retired  PLOF: Independent and gardens  PATIENT GOALS: garden, have left pain  NEXT MD VISIT:   OBJECTIVE:    PATIENT SURVEYS:  FOTO 22  SCREENING FOR RED FLAGS: Bowel or bladder incontinence: No Spinal tumors: No Cauda equina syndrome: No Compression fracture: No Abdominal aneurysm: No  COGNITION: Overall cognitive status: Within functional limits for tasks assessed     SENSATION: Reports neuropathy in her feet  MUSCLE LENGTH: Very tight HS and piriformis POSTURE: rounded shoulders and forward head  PALPATION: Tight and tender in the lumbar area and into the buttocks  Uses a rail for bed mobility  LUMBAR ROM:   AROM eval  Flexion Decreased 75%  Extension Decreased 75%  Right lateral flexion Decreased 75%  Left lateral flexion Decreased 75%  Right rotation   Left rotation    (Blank rows = not tested)  LOWER EXTREMITY ROM:     Active  Right eval Left eval  Hip flexion 70 45  Hip extension    Hip abduction    Hip adduction    Hip internal rotation    Hip external rotation    Knee flexion    Knee extension    Ankle dorsiflexion    Ankle plantarflexion    Ankle inversion    Ankle eversion     (Blank rows = not tested)  LOWER EXTREMITY MMT:    MMT Right eval Left eval  Hip flexion 4 4-  Hip extension    Hip abduction 4 4  Hip adduction    Hip internal rotation    Hip external rotation    Knee flexion 4- 4-  Knee extension 4 4  Ankle dorsiflexion    Ankle plantarflexion    Ankle inversion    Ankle eversion      (Blank rows = not tested)  FUNCTIONAL TESTS:  5 times sit to stand: 32 seconds Timed up and go (TUG): 21 seconds  GAIT: Distance walked: 60 feet Assistive device utilized: None Level of assistance: Complete Independence Comments: slow, unsteady, limp on the left  TODAY'S TREATMENT:                                                                                                                              DATE:   04/11/22 Nustep level 5 x 6 minutes Seated row 15# 2x10 Lats 15# 2x10 AR press 10# 2x10 Straight arm pulls a lot of cues for form and core activation 10# 2x10 Leg press 20# 2x10 Posture body mechanics about golfer's lift, workingsurface height and body mechanics for bed making, vacuuming Feet on ball K2C, trunk rotation, small posterior activation and isometric abs Passive LE stretches   PATIENT EDUCATION:  Education details: POC and HEP Person educated: Patient Education method: Consulting civil engineer, Media planner, and Handouts Education comprehension: verbalized understanding  HOME EXERCISE PROGRAM: Access Code: [email or web address] URL: [email or web address] Date: 04/03/2022 Prepared by: Lum Babe  Exercises - Supine Bridge  - 1 x daily - 7 x weekly - 2 sets - 10 reps - 3 hold - Supine Posterior Pelvic Tilt  - 1 x daily - 7 x weekly - 2 sets - 10 reps - 3 hold - Hooklying Single Knee to Chest Stretch  - 1 x daily - 7 x weekly - 2 sets - 10 reps - 3 hold - Standing Bilateral Low Shoulder Row with Anchored Resistance  - 1 x daily - 7 x weekly - 3 sets - 10 reps - 3 hold - Shoulder extension with resistance - Neutral  - 1 x daily - 7 x weekly - 3 sets - 10 reps - 3 hold  ASSESSMENT:  CLINICAL IMPRESSION: Patient is a 72 y.o. female who was is being seen  s/p lumbar fusion.   WE started gym activities for overall strength and conditioning and a focus on the core mms,  finished the session working on flexibility.  We did start the conversation about posture and  body mechanics and reviewed this and demonstrated.  She is very tight in the LE's  OBJECTIVE IMPAIRMENTS: Abnormal gait, cardiopulmonary status limiting activity, decreased activity tolerance, decreased balance, decreased endurance, decreased mobility, difficulty walking, decreased ROM, decreased strength, decreased safety awareness, increased muscle spasms, impaired flexibility, improper body mechanics, postural dysfunction, and pain.     REHAB POTENTIAL: Good  CLINICAL DECISION MAKING: Evolving/moderate complexity  EVALUATION COMPLEXITY: Low   GOALS: Goals reviewed with patient? Yes  SHORT TERM GOALS: Target date: 04/17/22  Independent with initial HEP Goal status: partially met still altering for pain LONG TERM GOALS: Target date: 07/03/22  Independent with advanced HEP Baseline:  Goal status: INITIAL  2.  Decrease TUG to 13 seconds Baseline: 22 seconds Goal status: INITIAL  3.  Be able to put shoes and socks on without difficulty  Baseline:  Goal status: INITIAL  4.  Increase LE strength to 4+/5 Baseline:  Goal status: INITIAL  5.  Be able to garden 1 hour without pain >5/10 Baseline:  Goal status: INITIAL  PLAN:  PT FREQUENCY: 2x/week  PT DURATION: 12 weeks  PLANNED INTERVENTIONS: Therapeutic exercises, Therapeutic activity, Neuromuscular re-education, Balance training, Gait training, Patient/Family education, Self Care, Joint mobilization, Dry Needling, Electrical stimulation, Cryotherapy, Moist heat, Manual therapy, and Re-evaluation.  PLAN FOR NEXT SESSION: start gym slowly, needs posture and body mechanics education and safety   Sumner Boast, PT 04/11/2022, 12:58 PM

## 2022-04-12 DIAGNOSIS — Z6836 Body mass index (BMI) 36.0-36.9, adult: Secondary | ICD-10-CM | POA: Diagnosis not present

## 2022-04-12 DIAGNOSIS — G5601 Carpal tunnel syndrome, right upper limb: Secondary | ICD-10-CM | POA: Diagnosis not present

## 2022-04-12 DIAGNOSIS — M4322 Fusion of spine, cervical region: Secondary | ICD-10-CM | POA: Diagnosis not present

## 2022-04-12 DIAGNOSIS — M542 Cervicalgia: Secondary | ICD-10-CM | POA: Diagnosis not present

## 2022-04-12 DIAGNOSIS — M545 Low back pain, unspecified: Secondary | ICD-10-CM | POA: Diagnosis not present

## 2022-04-12 DIAGNOSIS — M532X6 Spinal instabilities, lumbar region: Secondary | ICD-10-CM | POA: Diagnosis not present

## 2022-04-12 DIAGNOSIS — M13849 Other specified arthritis, unspecified hand: Secondary | ICD-10-CM | POA: Diagnosis not present

## 2022-04-12 DIAGNOSIS — M25531 Pain in right wrist: Secondary | ICD-10-CM | POA: Diagnosis not present

## 2022-04-12 DIAGNOSIS — M4326 Fusion of spine, lumbar region: Secondary | ICD-10-CM | POA: Diagnosis not present

## 2022-04-13 ENCOUNTER — Ambulatory Visit: Payer: Medicare Other | Attending: Orthopaedic Surgery

## 2022-04-13 ENCOUNTER — Other Ambulatory Visit: Payer: Self-pay

## 2022-04-13 DIAGNOSIS — R262 Difficulty in walking, not elsewhere classified: Secondary | ICD-10-CM | POA: Insufficient documentation

## 2022-04-13 DIAGNOSIS — M6281 Muscle weakness (generalized): Secondary | ICD-10-CM | POA: Insufficient documentation

## 2022-04-13 DIAGNOSIS — M5459 Other low back pain: Secondary | ICD-10-CM | POA: Insufficient documentation

## 2022-04-13 DIAGNOSIS — M62830 Muscle spasm of back: Secondary | ICD-10-CM | POA: Diagnosis not present

## 2022-04-13 NOTE — Therapy (Signed)
OUTPATIENT PHYSICAL THERAPY THORACOLUMBAR EVALUATION   Patient Name: [name] MRN: [id number] DOB:Sep 07, 1950, 72 y.o., female Today's Date: 04/13/2022  END OF SESSION:  PT End of Session - 04/13/22 1244     Visit Number 3    Date for PT Re-Evaluation 07/03/22    Authorization Type [employer]    PT Start Time 1250    PT Stop Time 1338    PT Time Calculation (min) 48 min             Past Medical History:  Diagnosis Date   Allergy    Anemia, iron deficiency 01/22/2011   May '12 - 13.8 Hgb, Nov 2, '12 Hgb 10.9    APC (atrial premature contractions) 02/17/2018   Arthritis    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   CAP (community acquired pneumonia) 10/21/2016   Chest pain in adult 07/08/2010   March 5, '09 negative cardiolite Sept 28, '10 negative nuclear stress (cardiolite) Dec 21, '11  Negative nuclear stress [facility] Fling) June 26, '14 Negative nuclear stress - [place] with Dr. Bettina Gavia per patient report.  Overview:  CAD risk score is low at 3%   Chondromalacia patellae 12/02/2012   Chronic back pain    Chronic kidney disease    Chronic knee pain    Chronic pain 09/22/2010   Multiple sources of pain: radicular pain from lumbar disease and post-operative pain; neck pain from cervical disk disease; left knee pain from trauma.  Plan - will wean off prednisone           Will continue percocet at 1  5/325 norco every 6 hours prn           NSAID - trial of meloxicam 75mg once a day           Increase gabapentin slowly to '600mg'$  tid and reassess           Executed a pain con   Chronic venous insufficiency 10/10/2013   Colitis 10/21/2016   Collagen vascular disease ([place]    Deformity of joint 04/23/2013   Depression with anxiety 09/21/2010   Derangement of knee, left 2009   started with fall at work. Has had repair patella and torn meniscus   DJD (degenerative joint disease) of knee 09/02/2012   Essential hypertension 07/08/2010   Long standing hypertension medically managed.  Currently seeing Dr. HDaneen Schickand Dr. MMarin Roberts([facility]) for cardiology.  Meds  HCT only  Overview:  Overview:  Long standing hypertension medically managed. Currently seeing Dr. HDaneen Schickand Dr. MBettina Gavia([facility]) for cardiology.  Meds  HCT only  Last Assessment & Plan:  Formatting of this note may be different from the original. BP Readings   Fever chills 10/21/2016   Fibromyalgia 02/27/2017   High cholesterol    Hx of appendectomy    Hypertension    Hypertensive chronic kidney disease 07/08/2010   Long standing hypertension medically managed. Currently seeing Dr. HDaneen Schickand Dr. MMarin Roberts([facility]) for cardiology.  Meds  HCT only  Overview:  Overview:  Long standing hypertension medically managed. Currently seeing Dr. HDaneen Schickand Dr. MBettina Gavia([facility]) for cardiology.  Meds  HCT only  Last Assessment & Plan:  Formatting of this note may be different from the original. BP Readings   Knee internal derangement 07/08/2010   Left knee: Feb '09 medial and lateral meniscal tears by MRI                  April '09 arthroscopic surgery  Melanoma ([place]) 11/19/2015   Migraine headache    hormonal related - less frequent off hormone replacement   Migraines    Neuropathy 04/23/2013   PAT (paroxysmal atrial tachycardia) 02/17/2018   Peripheral cyanosis 09/28/2012   Full evaluation by Dr. Tobie Lords for rheumatology - no evidence of Raynaud's syndrome or disease; no evidence of underlying rheumatologic disease or malignancy. Most probable cause is neurogenic.    Pseudoarthrosis of lumbar spine 01/02/2011   Psoriasis 04/23/2013   Raynaud disease    Routine health maintenance 08/17/2011   Colonoscopy - no record in EPIC of colonoscopy  Immunizations - Tdap June '13.     Salmonella enteritis 10/23/2016   Sepsis ([place]) 10/23/2016   Spinal stenosis 08/14/2010   history of cervical laminectomy, lumbar diskectomy with fixation devices and redo surgery.    Status post lumbar spinal fusion 10/02/2011    SVT (supraventricular tachycardia)    Synovial cyst of lumbar facet joint    lower back - s/p surgery    Tear of lateral meniscus of left knee 09/30/2012   Past Surgical History:  Procedure Laterality Date   APPENDECTOMY  1971   CERVICAL DISCECTOMY  1999   diskectomy with fixation:plate and screws   COLONOSCOPY     ESOPHAGOGASTRODUODENOSCOPY  12/2014   KNEE ARTHROSCOPY W/ MENISCAL REPAIR  09   left knee   L4-5 lumbar fusion  September 02, 2010   Dr. Hal Neer:  minimally invasive transforaminal interbody fusion with spacer   OVARIAN CYST SURGERY  1972   PATELLAR REEFING  '09   repair of fractured patella after fall   [place]  2007   lumbar spine   VENOUS ABLATION     for pain in the groin - VVTS did procedure. Has some residual discomfort at the  ablation site.    WISDOM TOOTH EXTRACTION     Patient Active Problem List   Diagnosis Date Noted   Synovial cyst of lumbar facet joint    Raynaud disease    Migraines    Migraine headache    Hypertension    Hx of appendectomy    High cholesterol    Collagen vascular disease ([place])    Chronic knee pain    Chronic kidney disease    Allergy    Arthritis    Chronic right-sided low back pain with right-sided sciatica 12/12/2018   Left-sided trigeminal neuralgia 06/03/2018   Trigeminal neuralgia of left side of face 04/30/2018   APC (atrial premature contractions) 02/17/2018   PAT (paroxysmal atrial tachycardia) 02/17/2018   Palpitations 12/24/2017   Angular cheilitis 05/10/2017   Dysphonia 05/10/2017   Laryngopharyngeal reflux (LPR) 05/10/2017   Mucous retention cyst of maxillary sinus 05/10/2017   Recurrent epistaxis 05/10/2017   Upper airway cough syndrome 04/26/2017   Fibromyalgia 02/27/2017   Sepsis ([place]) 10/23/2016   Salmonella enteritis 10/23/2016   Colitis 10/21/2016   CAP (community acquired pneumonia) 10/21/2016   Fever chills 10/21/2016   Melanoma ([place]) 11/19/2015   SVT  (supraventricular tachycardia) 05/20/2015   Chronic venous insufficiency 10/10/2013   Neuropathy 04/23/2013   Psoriasis 04/23/2013   Deformity of joint 04/23/2013   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   Chondromalacia patellae 12/02/2012   Tear of lateral meniscus of left knee 09/30/2012   Peripheral cyanosis 09/28/2012   DJD (degenerative joint disease) of knee 09/02/2012   Status post lumbar spinal fusion 10/02/2011   Routine health maintenance 08/17/2011   Anemia, iron deficiency 01/22/2011  Chronic back pain 01/02/2011   Pseudoarthrosis of lumbar spine 01/02/2011   Chronic pain 09/22/2010   Depression with anxiety 09/21/2010   Spinal stenosis 08/14/2010   Hypertensive chronic kidney disease 07/08/2010   Migraine 07/08/2010   Knee internal derangement 07/08/2010   Chest pain in adult 07/08/2010   Essential hypertension 07/08/2010   Derangement of knee, left 2009    PCP: Doristine Bosworth, MD  REFERRING PROVIDER: Patrice Paradise, MD  REFERRING DIAG: S/P Lumbar fusion L1-S1  Rationale for Evaluation and Treatment: Rehabilitation  THERAPY DIAG:  Other low back pain  Muscle spasm of back  Muscle weakness (generalized)  Difficulty in walking, not elsewhere classified  ONSET DATE: 01/02/22  SUBJECTIVE:     I was a little sore after last time but also seemed to be abe to move better.  Using  ice as needed                                                                                                                                                                                      SUBJECTIVE STATEMENT: `PERTINENT HISTORY:  See above  PAIN:  Are you having pain? Yes: NPRS scale: 5/10 Pain location: left low back Pain description: sharp, ache Aggravating factors: standing, leaning over, longer periods od activity, worse end of day pain up to 10/10 Relieving factors: ice, heat, pain meds 5/10 at best  PRECAUTIONS: Back and Other: no lifting > 10#, be careful with bending  and twisting  WEIGHT BEARING RESTRICTIONS: No  FALLS:  Has patient fallen in last 6 months? No  LIVING ENVIRONMENT: Lives with: lives with their family and lives with their spouse Lives in: House/apartment Stairs: No Has following equipment at home: Single point cane  OCCUPATION: retired  PLOF: Independent and gardens  PATIENT GOALS: garden, have left pain  NEXT MD VISIT:   OBJECTIVE:    PATIENT SURVEYS:  FOTO 65  SCREENING FOR RED FLAGS: Bowel or bladder incontinence: No Spinal tumors: No Cauda equina syndrome: No Compression fracture: No Abdominal aneurysm: No  COGNITION: Overall cognitive status: Within functional limits for tasks assessed     SENSATION: Reports neuropathy in her feet  MUSCLE LENGTH: Very tight HS and piriformis POSTURE: rounded shoulders and forward head  PALPATION: Tight and tender in the lumbar area and into the buttocks  Uses a rail for bed mobility  LUMBAR ROM:   AROM eval  Flexion Decreased 75%  Extension Decreased 75%  Right lateral flexion Decreased 75%  Left lateral flexion Decreased 75%  Right rotation   Left rotation    (Blank rows = not tested)  LOWER EXTREMITY ROM:     Active  Right eval  Left eval  Hip flexion 70 45  Hip extension    Hip abduction    Hip adduction    Hip internal rotation    Hip external rotation    Knee flexion    Knee extension    Ankle dorsiflexion    Ankle plantarflexion    Ankle inversion    Ankle eversion     (Blank rows = not tested)  LOWER EXTREMITY MMT:    MMT Right eval Left eval  Hip flexion 4 4-  Hip extension    Hip abduction 4 4  Hip adduction    Hip internal rotation    Hip external rotation    Knee flexion 4- 4-  Knee extension 4 4  Ankle dorsiflexion    Ankle plantarflexion    Ankle inversion    Ankle eversion     (Blank rows = not tested)  FUNCTIONAL TESTS:  5 times sit to stand: 32 seconds Timed up and go (TUG): 21 seconds  GAIT: Distance walked:  60 feet Assistive device utilized: None Level of assistance: Complete Independence Comments: slow, unsteady, limp on the left  TODAY'S TREATMENT:                                                                                                                              DATE:   04/13/22:   Therapeutic exercise:  the patient was instructed and supervised with the following exercises designed primarily to address her core/stability musculature and LE flexibility Nustep level 5, 6 min  Seated row 15#, 3 x 10 reps  Leg press 2 x 10, 25#  Supine green therapeutic ball DK2C LTR green ball   Hooklying , thighs on green ball, with alt leg lifts off ball 2" Supine for therapist applied overpressure to stretch each hip into flexion 30 sec holds, 3 reps each Seated lat pulls 30# 3 x 10  Supine dead bug alt arms and legs 10 reps Supine isometric alt hip and knee flex with UE resistance, 5 sec holds, 10 reps  Paloff press  10# then 5#, 1# increased pain R trunk Seated chest press : 10# 2 sets   04/11/22 Nustep level 5 x 6 minutes Seated row 15# 2x10 Lats 15# 2x10 AR press 10# 2x10 Straight arm pulls a lot of cues for form and core activation 10# 2x10 Leg press 20# 2x10 Posture body mechanics about golfer's lift, workingsurface height and body mechanics for bed making, vacuuming Feet on ball K2C, trunk rotation, small posterior activation and isometric abs Passive LE stretches   PATIENT EDUCATION:  Education details: POC and HEP Person educated: Patient Education method: Consulting civil engineer, Media planner, and Handouts Education comprehension: verbalized understanding  HOME EXERCISE PROGRAM: Access Code: [email or web address] URL: [email or web address] Date: 04/03/2022 Prepared by: Lum Babe  Exercises - Supine Bridge  - 1 x daily - 7 x weekly - 2 sets - 10 reps - 3 hold - Supine Posterior Pelvic Tilt  - 1 x  daily - 7 x weekly - 2 sets - 10 reps - 3 hold - Hooklying Single Knee to  Chest Stretch  - 1 x daily - 7 x weekly - 2 sets - 10 reps - 3 hold - Standing Bilateral Low Shoulder Row with Anchored Resistance  - 1 x daily - 7 x weekly - 3 sets - 10 reps - 3 hold - Shoulder extension with resistance - Neutral  - 1 x daily - 7 x weekly - 3 sets - 10 reps - 3 hold  ASSESSMENT:  CLINICAL IMPRESSION: Patient is a 72 y.o. female who was is being seen  s/p lumbar fusion.  Continued with endurance, periscapular strengthening.  Added more hip flexor/ lower abdominal strengthening in supine as well.  She tolerated the new ex well. Still restricted flexibility especially for hip flexion B.  OBJECTIVE IMPAIRMENTS: Abnormal gait, cardiopulmonary status limiting activity, decreased activity tolerance, decreased balance, decreased endurance, decreased mobility, difficulty walking, decreased ROM, decreased strength, decreased safety awareness, increased muscle spasms, impaired flexibility, improper body mechanics, postural dysfunction, and pain.     REHAB POTENTIAL: Good  CLINICAL DECISION MAKING: Evolving/moderate complexity  EVALUATION COMPLEXITY: Low   GOALS: Goals reviewed with patient? Yes  SHORT TERM GOALS: Target date: 04/17/22  Independent with initial HEP Goal status: partially met still altering for pain LONG TERM GOALS: Target date: 07/03/22  Independent with advanced HEP Baseline:  Goal status: INITIAL  2.  Decrease TUG to 13 seconds Baseline: 22 seconds Goal status: INITIAL  3.  Be able to put shoes and socks on without difficulty  Baseline:  Goal status: INITIAL  4.  Increase LE strength to 4+/5 Baseline:  Goal status: INITIAL  5.  Be able to garden 1 hour without pain >5/10 Baseline:  Goal status: INITIAL  PLAN:  PT FREQUENCY: 2x/week  PT DURATION: 12 weeks  PLANNED INTERVENTIONS: Therapeutic exercises, Therapeutic activity, Neuromuscular re-education, Balance training, Gait training, Patient/Family education, Self Care, Joint mobilization,  Dry Needling, Electrical stimulation, Cryotherapy, Moist heat, Manual therapy, and Re-evaluation.  PLAN FOR NEXT SESSION: start gym slowly, needs posture and body mechanics education and safety   Ayanna Gheen L Mackenze [place], PT 04/13/2022, 1:57 PM

## 2022-04-18 ENCOUNTER — Encounter: Payer: Self-pay | Admitting: Physical Therapy

## 2022-04-18 ENCOUNTER — Ambulatory Visit: Payer: Medicare Other | Admitting: Physical Therapy

## 2022-04-18 DIAGNOSIS — R262 Difficulty in walking, not elsewhere classified: Secondary | ICD-10-CM | POA: Diagnosis not present

## 2022-04-18 DIAGNOSIS — M5459 Other low back pain: Secondary | ICD-10-CM

## 2022-04-18 DIAGNOSIS — M6281 Muscle weakness (generalized): Secondary | ICD-10-CM

## 2022-04-18 DIAGNOSIS — M62830 Muscle spasm of back: Secondary | ICD-10-CM

## 2022-04-18 NOTE — Therapy (Signed)
OUTPATIENT PHYSICAL THERAPY THORACOLUMBAR EVALUATION   Patient Name: [name] MRN: [id number] DOB:08-14-1950, 72 y.o., female Today's Date: 04/18/2022  END OF SESSION:  PT End of Session - 04/18/22 1058     Visit Number 4    Date for PT Re-Evaluation 07/03/22    Authorization Type [employer]    PT Start Time 1055    PT Stop Time 1145    PT Time Calculation (min) 50 min    Activity Tolerance Patient tolerated treatment well    Behavior During Therapy [facility] for tasks assessed/performed             Past Medical History:  Diagnosis Date   Allergy    Anemia, iron deficiency 01/22/2011   May '12 - 13.8 Hgb, Nov 2, '12 Hgb 10.9    APC (atrial premature contractions) 02/17/2018   Arthritis    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   CAP (community acquired pneumonia) 10/21/2016   Chest pain in adult 07/08/2010   March 5, '09 negative cardiolite Sept 28, '10 negative nuclear stress (cardiolite) Dec 21, '11  Negative nuclear stress Brantley Fling) June 26, '14 Negative nuclear stress - [place] with Dr. Bettina Gavia per patient report.  Overview:  CAD risk score is low at 3%   Chondromalacia patellae 12/02/2012   Chronic back pain    Chronic kidney disease    Chronic knee pain    Chronic pain 09/22/2010   Multiple sources of pain: radicular pain from lumbar disease and post-operative pain; neck pain from cervical disk disease; left knee pain from trauma.  Plan - will wean off prednisone           Will continue percocet at 1  5/325 norco every 6 hours prn           NSAID - trial of meloxicam 82mg once a day           Increase gabapentin slowly to '600mg'$  tid and reassess           Executed a pain con   Chronic venous insufficiency 10/10/2013   Colitis 10/21/2016   [place] vascular disease [place]    Deformity of joint 04/23/2013   Depression with anxiety 09/21/2010   Derangement of knee, left 2009   started with fall at work. Has had repair patella and torn meniscus   DJD (degenerative  joint disease) of knee 09/02/2012   Essential hypertension 07/08/2010   Long standing hypertension medically managed. Currently seeing Dr. HDaneen Schickand Dr. MMarin Roberts([facility]) for cardiology.  Meds  HCT only  Overview:  Overview:  Long standing hypertension medically managed. Currently seeing Dr. HDaneen Schickand Dr. MBettina Gavia([facility]) for cardiology.  Meds  HCT only  Last Assessment & Plan:  Formatting of this note may be different from the original. BP Readings   Fever chills 10/21/2016   Fibromyalgia 02/27/2017   High cholesterol    Hx of appendectomy    Hypertension    Hypertensive chronic kidney disease 07/08/2010   Long standing hypertension medically managed. Currently seeing Dr. HDaneen Schickand Dr. MMarin Roberts([facility]) for cardiology.  Meds  HCT only  Overview:  Overview:  Long standing hypertension medically managed. Currently seeing Dr. HDaneen Schickand Dr. MBettina Gavia([facility]) for cardiology.  Meds  HCT only  Last Assessment & Plan:  Formatting of this note may be different from the original. BP Readings   Knee internal derangement 07/08/2010   Left knee: Feb '09 medial and lateral meniscal tears by MRI  April '09 arthroscopic surgery    Melanoma ([place]) 11/19/2015   Migraine headache    hormonal related - less frequent off hormone replacement   Migraines    Neuropathy 04/23/2013   PAT (paroxysmal atrial tachycardia) 02/17/2018   Peripheral cyanosis 09/28/2012   Full evaluation by Dr. Tobie Lords for rheumatology - no evidence of Raynaud's syndrome or disease; no evidence of underlying rheumatologic disease or malignancy. Most probable cause is neurogenic.    Pseudoarthrosis of lumbar spine 01/02/2011   Psoriasis 04/23/2013   Raynaud disease    Routine health maintenance 08/17/2011   Colonoscopy - no record in EPIC of colonoscopy  Immunizations - Tdap June '13.     Salmonella enteritis 10/23/2016   Sepsis ([place]) 10/23/2016   Spinal stenosis 08/14/2010   history of cervical  laminectomy, lumbar diskectomy with fixation devices and redo surgery.    Status post lumbar spinal fusion 10/02/2011   SVT (supraventricular tachycardia)    Synovial cyst of lumbar facet joint    lower back - s/p surgery    Tear of lateral meniscus of left knee 09/30/2012   Past Surgical History:  Procedure Laterality Date   APPENDECTOMY  1971   CERVICAL DISCECTOMY  1999   diskectomy with fixation:plate and screws   COLONOSCOPY     ESOPHAGOGASTRODUODENOSCOPY  12/2014   KNEE ARTHROSCOPY W/ MENISCAL REPAIR  09   left knee   L4-5 lumbar fusion  September 02, 2010   Dr. Hal Neer:  minimally invasive transforaminal interbody fusion with spacer   OVARIAN CYST SURGERY  1972   PATELLAR REEFING  '09   repair of fractured patella after fall   [place]  2007   lumbar spine   VENOUS ABLATION     for pain in the groin - VVTS did procedure. Has some residual discomfort at the  ablation site.    WISDOM TOOTH EXTRACTION     Patient Active Problem List   Diagnosis Date Noted   Synovial cyst of lumbar facet joint    Raynaud disease    Migraines    Migraine headache    Hypertension    Hx of appendectomy    High cholesterol    Collagen vascular disease ([place])    Chronic knee pain    Chronic kidney disease    Allergy    Arthritis    Chronic right-sided low back pain with right-sided sciatica 12/12/2018   Left-sided trigeminal neuralgia 06/03/2018   Trigeminal neuralgia of left side of face 04/30/2018   APC (atrial premature contractions) 02/17/2018   PAT (paroxysmal atrial tachycardia) 02/17/2018   Palpitations 12/24/2017   Angular cheilitis 05/10/2017   Dysphonia 05/10/2017   Laryngopharyngeal reflux (LPR) 05/10/2017   Mucous retention cyst of maxillary sinus 05/10/2017   Recurrent epistaxis 05/10/2017   Upper airway cough syndrome 04/26/2017   Fibromyalgia 02/27/2017   Sepsis ([place]) 10/23/2016   Salmonella enteritis 10/23/2016   Colitis 10/21/2016   CAP  (community acquired pneumonia) 10/21/2016   Fever chills 10/21/2016   Melanoma ([place]) 11/19/2015   SVT (supraventricular tachycardia) 05/20/2015   Chronic venous insufficiency 10/10/2013   Neuropathy 04/23/2013   Psoriasis 04/23/2013   Deformity of joint 04/23/2013   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   Chondromalacia patellae 12/02/2012   Tear of lateral meniscus of left knee 09/30/2012   Peripheral cyanosis 09/28/2012   DJD (degenerative joint disease) of knee 09/02/2012   Status post lumbar spinal fusion 10/02/2011   Routine health maintenance 08/17/2011  Anemia, iron deficiency 01/22/2011   Chronic back pain 01/02/2011   Pseudoarthrosis of lumbar spine 01/02/2011   Chronic pain 09/22/2010   Depression with anxiety 09/21/2010   Spinal stenosis 08/14/2010   Hypertensive chronic kidney disease 07/08/2010   Migraine 07/08/2010   Knee internal derangement 07/08/2010   Chest pain in adult 07/08/2010   Essential hypertension 07/08/2010   Derangement of knee, left 2009    PCP: Doristine Bosworth, MD  REFERRING PROVIDER: Patrice Paradise, MD  REFERRING DIAG: S/P Lumbar fusion L1-S1  Rationale for Evaluation and Treatment: Rehabilitation  THERAPY DIAG:  Other low back pain  Muscle spasm of back  Muscle weakness (generalized)  Difficulty in walking, not elsewhere classified  ONSET DATE: 01/02/22  SUBJECTIVE:     Reports some pain and stiffness reports that she did some yardwork yesterday and tried to watch her body mechanics and feels that she did well with this.  Pain a 3/10 today with moving.                                                                                                                                                                           SUBJECTIVE STATEMENT: `PERTINENT HISTORY:  See above  PAIN:  Are you having pain? Yes: NPRS scale: 3/10 Pain location: left low back Pain description: sharp, ache Aggravating factors: standing, leaning over, longer  periods od activity, worse end of day pain up to 10/10 Relieving factors: ice, heat, pain meds 5/10 at best  PRECAUTIONS: Back and Other: no lifting > 10#, be careful with bending and twisting  WEIGHT BEARING RESTRICTIONS: No  FALLS:  Has patient fallen in last 6 months? No  LIVING ENVIRONMENT: Lives with: lives with their family and lives with their spouse Lives in: House/apartment Stairs: No Has following equipment at home: Single point cane  OCCUPATION: retired  PLOF: Independent and gardens  PATIENT GOALS: garden, have left pain  NEXT MD VISIT:   OBJECTIVE:    PATIENT SURVEYS:  FOTO 59  SCREENING FOR RED FLAGS: Bowel or bladder incontinence: No Spinal tumors: No Cauda equina syndrome: No Compression fracture: No Abdominal aneurysm: No  COGNITION: Overall cognitive status: Within functional limits for tasks assessed     SENSATION: Reports neuropathy in her feet  MUSCLE LENGTH: Very tight HS and piriformis POSTURE: rounded shoulders and forward head  PALPATION: Tight and tender in the lumbar area and into the buttocks  Uses a rail for bed mobility  LUMBAR ROM:   AROM eval  Flexion Decreased 75%  Extension Decreased 75%  Right lateral flexion Decreased 75%  Left lateral flexion Decreased 75%  Right rotation   Left rotation    (Blank rows = not tested)  LOWER EXTREMITY ROM:  Active  Right eval Left eval  Hip flexion 70 45  Hip extension    Hip abduction    Hip adduction    Hip internal rotation    Hip external rotation    Knee flexion    Knee extension    Ankle dorsiflexion    Ankle plantarflexion    Ankle inversion    Ankle eversion     (Blank rows = not tested)  LOWER EXTREMITY MMT:    MMT Right eval Left eval  Hip flexion 4 4-  Hip extension    Hip abduction 4 4  Hip adduction    Hip internal rotation    Hip external rotation    Knee flexion 4- 4-  Knee extension 4 4  Ankle dorsiflexion    Ankle plantarflexion     Ankle inversion    Ankle eversion     (Blank rows = not tested)  FUNCTIONAL TESTS:  5 times sit to stand: 32 seconds Timed up and go (TUG): 21 seconds  GAIT: Distance walked: 60 feet Assistive device utilized: None Level of assistance: Complete Independence Comments: slow, unsteady, limp on the left  TODAY'S TREATMENT:                                                                                                                              DATE:   04/18/22 Bike Level 4 x 5 minutes Seated pball rollouts 3 ways Nustep level 5 x 5 minutes Seated row 15# 2x10 Lats 15# 2x10 Side stepping on and off airex, airex cone toe touches for balance Feet on ball K2C, trunk rotation, small bridge and isometric abs Hooklying ball b/n knees squeeze Green tband clamshells PROM HS and piriformis SLR 2x10 bilateral cues for core engagement  04/13/22:   Therapeutic exercise:  the patient was instructed and supervised with the following exercises designed primarily to address her core/stability musculature and LE flexibility Nustep level 5, 6 min  Seated row 15#, 3 x 10 reps  Leg press 2 x 10, 25#  Supine green therapeutic ball DK2C LTR green ball   Hooklying , thighs on green ball, with alt leg lifts off ball 2" Supine for therapist applied overpressure to stretch each hip into flexion 30 sec holds, 3 reps each Seated lat pulls 30# 3 x 10  Supine dead bug alt arms and legs 10 reps Supine isometric alt hip and knee flex with UE resistance, 5 sec holds, 10 reps  Paloff press  10# then 5#, 1# increased pain R trunk Seated chest press : 10# 2 sets   04/11/22 Nustep level 5 x 6 minutes Seated row 15# 2x10 Lats 15# 2x10 AR press 10# 2x10 Straight arm pulls a lot of cues for form and core activation 10# 2x10 Leg press 20# 2x10 Posture body mechanics about golfer's lift, workingsurface height and body mechanics for bed making, vacuuming Feet on ball K2C, trunk rotation, small posterior  activation and isometric abs  Passive LE stretches   PATIENT EDUCATION:  Education details: POC and HEP Person educated: Patient Education method: Explanation, Demonstration, and Handouts Education comprehension: verbalized understanding  HOME EXERCISE PROGRAM: Access Code: [email or web address] URL: [email or web address] Date: 04/03/2022 Prepared by: Lum Babe  Exercises - Supine Bridge  - 1 x daily - 7 x weekly - 2 sets - 10 reps - 3 hold - Supine Posterior Pelvic Tilt  - 1 x daily - 7 x weekly - 2 sets - 10 reps - 3 hold - Hooklying Single Knee to Chest Stretch  - 1 x daily - 7 x weekly - 2 sets - 10 reps - 3 hold - Standing Bilateral Low Shoulder Row with Anchored Resistance  - 1 x daily - 7 x weekly - 3 sets - 10 reps - 3 hold - Shoulder extension with resistance - Neutral  - 1 x daily - 7 x weekly - 3 sets - 10 reps - 3 hold  ASSESSMENT:  CLINICAL IMPRESSION: Patient is a 72 y.o. female who was is being seen  s/p lumbar fusion.  Reports that her hips were a little sore after the last treatment, she did get out yesterday and trimmed some bushes she reports that she tried to follow the education on the posture and body mechanics.  She was tight and slow at first but once she got moving she looked much better  OBJECTIVE IMPAIRMENTS: Abnormal gait, cardiopulmonary status limiting activity, decreased activity tolerance, decreased balance, decreased endurance, decreased mobility, difficulty walking, decreased ROM, decreased strength, decreased safety awareness, increased muscle spasms, impaired flexibility, improper body mechanics, postural dysfunction, and pain.     REHAB POTENTIAL: Good  CLINICAL DECISION MAKING: Evolving/moderate complexity  EVALUATION COMPLEXITY: Low   GOALS: Goals reviewed with patient? Yes  SHORT TERM GOALS: Target date: 04/17/22  Independent with initial HEP Goal status: partially met still altering for pain LONG TERM GOALS: Target date:  07/03/22  Independent with advanced HEP Baseline:  Goal status: INITIAL  2.  Decrease TUG to 13 seconds Baseline: 22 seconds Goal status: INITIAL  3.  Be able to put shoes and socks on without difficulty  Baseline:  Goal status: INITIAL  4.  Increase LE strength to 4+/5 Baseline:  Goal status: INITIAL  5.  Be able to garden 1 hour without pain >5/10 Baseline:  Goal status: INITIAL  PLAN:  PT FREQUENCY: 2x/week  PT DURATION: 12 weeks  PLANNED INTERVENTIONS: Therapeutic exercises, Therapeutic activity, Neuromuscular re-education, Balance training, Gait training, Patient/Family education, Self Care, Joint mobilization, Dry Needling, Electrical stimulation, Cryotherapy, Moist heat, Manual therapy, and Re-evaluation.  PLAN FOR NEXT SESSION: start gym slowly, needs posture and body mechanics education and safety   Sumner Boast, PT 04/18/2022, 11:01 AM

## 2022-04-20 ENCOUNTER — Ambulatory Visit: Payer: Medicare Other | Admitting: Physical Therapy

## 2022-04-24 ENCOUNTER — Encounter: Payer: Self-pay | Admitting: Physical Therapy

## 2022-04-24 ENCOUNTER — Ambulatory Visit: Payer: Medicare Other | Admitting: Physical Therapy

## 2022-04-24 DIAGNOSIS — M62830 Muscle spasm of back: Secondary | ICD-10-CM

## 2022-04-24 DIAGNOSIS — M5459 Other low back pain: Secondary | ICD-10-CM

## 2022-04-24 DIAGNOSIS — M6281 Muscle weakness (generalized): Secondary | ICD-10-CM

## 2022-04-24 DIAGNOSIS — R262 Difficulty in walking, not elsewhere classified: Secondary | ICD-10-CM | POA: Diagnosis not present

## 2022-04-24 NOTE — Therapy (Signed)
OUTPATIENT PHYSICAL THERAPY THORACOLUMBAR TREATMENT   Patient Name: [name] MRN: [id number] DOB:1950/06/05, 72 y.o., female Today's Date: 04/24/2022  END OF SESSION:  PT End of Session - 04/24/22 1317     Visit Number 5    Date for PT Re-Evaluation 07/03/22    Authorization Type [employer]    PT Start Time 1313    PT Stop Time 1400    PT Time Calculation (min) 47 min    Activity Tolerance Patient tolerated treatment well    Behavior During Therapy [facility] for tasks assessed/performed             Past Medical History:  Diagnosis Date   Allergy    Anemia, iron deficiency 01/22/2011   May '12 - 13.8 Hgb, Nov 2, '12 Hgb 10.9    APC (atrial premature contractions) 02/17/2018   Arthritis    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   CAP (community acquired pneumonia) 10/21/2016   Chest pain in adult 07/08/2010   March 5, '09 negative cardiolite Sept 28, '10 negative nuclear stress (cardiolite) Dec 21, '11  Negative nuclear stress Brantley Fling) June 26, '14 Negative nuclear stress - [place] with Dr. Bettina Gavia per patient report.  Overview:  CAD risk score is low at 3%   Chondromalacia patellae 12/02/2012   Chronic back pain    Chronic kidney disease    Chronic knee pain    Chronic pain 09/22/2010   Multiple sources of pain: radicular pain from lumbar disease and post-operative pain; neck pain from cervical disk disease; left knee pain from trauma.  Plan - will wean off prednisone           Will continue percocet at 1  5/325 norco every 6 hours prn           NSAID - trial of meloxicam 58mg once a day           Increase gabapentin slowly to [id number] and reassess           Executed a pain con   Chronic venous insufficiency 10/10/2013   Colitis 10/21/2016   Collagen vascular disease ([place]   Deformity of joint 04/23/2013   Depression with anxiety 09/21/2010   Derangement of knee, left 2009   started with fall at work. Has had repair patella and torn meniscus   DJD (degenerative  joint disease) of knee 09/02/2012   Essential hypertension 07/08/2010   Long standing hypertension medically managed. Currently seeing Dr. HeDaneen Schicknd Dr. MuMarin Robertscornerstone) for cardiology.  Meds  HCT only  Overview:  Overview:  Long standing hypertension medically managed. Currently seeing Dr. HeDaneen Schicknd Dr. MuBettina Gaviacornerstone) for cardiology.  Meds  HCT only  Last Assessment & Plan:  Formatting of this note may be different from the original. BP Readings   Fever chills 10/21/2016   Fibromyalgia 02/27/2017   High cholesterol    Hx of appendectomy    Hypertension    Hypertensive chronic kidney disease 07/08/2010   Long standing hypertension medically managed. Currently seeing Dr. HeDaneen Schicknd Dr. MuMarin Robertscornerstone) for cardiology.  Meds  HCT only  Overview:  Overview:  Long standing hypertension medically managed. Currently seeing Dr. HeDaneen Schicknd Dr. MuBettina Gaviacornerstone) for cardiology.  Meds  HCT only  Last Assessment & Plan:  Formatting of this note may be different from the original. BP Readings   Knee internal derangement 07/08/2010   Left knee: Feb '09 medial and lateral meniscal tears by MRI  April '09 arthroscopic surgery    Melanoma ([place]) 11/19/2015   Migraine headache    hormonal related - less frequent off hormone replacement   Migraines    Neuropathy 04/23/2013   PAT (paroxysmal atrial tachycardia) 02/17/2018   Peripheral cyanosis 09/28/2012   Full evaluation by Dr. Tobie Lords for rheumatology - no evidence of Raynaud's syndrome or disease; no evidence of underlying rheumatologic disease or malignancy. Most probable cause is neurogenic.    Pseudoarthrosis of lumbar spine 01/02/2011   Psoriasis 04/23/2013   Raynaud disease    Routine health maintenance 08/17/2011   Colonoscopy - no record in EPIC of colonoscopy  Immunizations - Tdap June '13.     Salmonella enteritis 10/23/2016   Sepsis ([place]) 10/23/2016   Spinal stenosis 08/14/2010   history of cervical  laminectomy, lumbar diskectomy with fixation devices and redo surgery.    Status post lumbar spinal fusion 10/02/2011   SVT (supraventricular tachycardia)    Synovial cyst of lumbar facet joint    lower back - s/p surgery    Tear of lateral meniscus of left knee 09/30/2012   Past Surgical History:  Procedure Laterality Date   APPENDECTOMY  1971   CERVICAL DISCECTOMY  1999   diskectomy with fixation:plate and screws   COLONOSCOPY     ESOPHAGOGASTRODUODENOSCOPY  12/2014   KNEE ARTHROSCOPY W/ MENISCAL REPAIR  09   left knee   L4-5 lumbar fusion  September 02, 2010   Dr. Hal Neer:  minimally invasive transforaminal interbody fusion with spacer   OVARIAN CYST SURGERY  1972   PATELLAR REEFING  '09   repair of fractured patella after fall   [place]  2007   lumbar spine   VENOUS ABLATION     for pain in the groin - VVTS did procedure. Has some residual discomfort at the  ablation site.    WISDOM TOOTH EXTRACTION     Patient Active Problem List   Diagnosis Date Noted   Synovial cyst of lumbar facet joint    Raynaud disease    Migraines    Migraine headache    Hypertension    Hx of appendectomy    High cholesterol    Collagen vascular disease ([place])    Chronic knee pain    Chronic kidney disease    Allergy    Arthritis    Chronic right-sided low back pain with right-sided sciatica 12/12/2018   Left-sided trigeminal neuralgia 06/03/2018   Trigeminal neuralgia of left side of face 04/30/2018   APC (atrial premature contractions) 02/17/2018   PAT (paroxysmal atrial tachycardia) 02/17/2018   Palpitations 12/24/2017   Angular cheilitis 05/10/2017   Dysphonia 05/10/2017   Laryngopharyngeal reflux (LPR) 05/10/2017   Mucous retention cyst of maxillary sinus 05/10/2017   Recurrent epistaxis 05/10/2017   Upper airway cough syndrome 04/26/2017   Fibromyalgia 02/27/2017   Sepsis ([place]) 10/23/2016   Salmonella enteritis 10/23/2016   Colitis 10/21/2016   CAP  (community acquired pneumonia) 10/21/2016   Fever chills 10/21/2016   Melanoma ([place]) 11/19/2015   SVT (supraventricular tachycardia) 05/20/2015   Chronic venous insufficiency 10/10/2013   Neuropathy 04/23/2013   Psoriasis 04/23/2013   Deformity of joint 04/23/2013   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   Chondromalacia patellae 12/02/2012   Tear of lateral meniscus of left knee 09/30/2012   Peripheral cyanosis 09/28/2012   DJD (degenerative joint disease) of knee 09/02/2012   Status post lumbar spinal fusion 10/02/2011   Routine health maintenance 08/17/2011  Anemia, iron deficiency 01/22/2011   Chronic back pain 01/02/2011   Pseudoarthrosis of lumbar spine 01/02/2011   Chronic pain 09/22/2010   Depression with anxiety 09/21/2010   Spinal stenosis 08/14/2010   Hypertensive chronic kidney disease 07/08/2010   Migraine 07/08/2010   Knee internal derangement 07/08/2010   Chest pain in adult 07/08/2010   Essential hypertension 07/08/2010   Derangement of knee, left 2009    PCP: Doristine Bosworth, MD  REFERRING PROVIDER: Patrice Paradise, MD  REFERRING DIAG: S/P Lumbar fusion L1-S1  Rationale for Evaluation and Treatment: Rehabilitation  THERAPY DIAG:  Other low back pain  Muscle spasm of back  Muscle weakness (generalized)  Difficulty in walking, not elsewhere classified  ONSET DATE: 01/02/22  SUBJECTIVE:     Patient reports that she fell on Wednesday, was backing up and misstepped, she reports being very sore in the back and the ribs, did not go the MD.  Pain a 6/10  in the low back and around to the right rib area                                                                                                                                                                            SUBJECTIVE STATEMENT: `PERTINENT HISTORY:  See above  PAIN:  Are you having pain? Yes: NPRS scale: 3/10 Pain location: left low back Pain description: sharp, ache Aggravating factors: standing,  leaning over, longer periods od activity, worse end of day pain up to 10/10 Relieving factors: ice, heat, pain meds 5/10 at best  PRECAUTIONS: Back and Other: no lifting > 10#, be careful with bending and twisting  WEIGHT BEARING RESTRICTIONS: No  FALLS:  Has patient fallen in last 6 months? No  LIVING ENVIRONMENT: Lives with: lives with their family and lives with their spouse Lives in: House/apartment Stairs: No Has following equipment at home: Single point cane  OCCUPATION: retired  PLOF: Independent and gardens  PATIENT GOALS: garden, have left pain  NEXT MD VISIT:   OBJECTIVE:    PATIENT SURVEYS:  FOTO 4  SCREENING FOR RED FLAGS: Bowel or bladder incontinence: No Spinal tumors: No Cauda equina syndrome: No Compression fracture: No Abdominal aneurysm: No  COGNITION: Overall cognitive status: Within functional limits for tasks assessed     SENSATION: Reports neuropathy in her feet  MUSCLE LENGTH: Very tight HS and piriformis POSTURE: rounded shoulders and forward head  PALPATION: Tight and tender in the lumbar area and into the buttocks  Uses a rail for bed mobility  LUMBAR ROM:   AROM eval  Flexion Decreased 75%  Extension Decreased 75%  Right lateral flexion Decreased 75%  Left lateral flexion Decreased 75%  Right rotation   Left rotation    (  Blank rows = not tested)  LOWER EXTREMITY ROM:     Active  Right eval Left eval  Hip flexion 70 45  Hip extension    Hip abduction    Hip adduction    Hip internal rotation    Hip external rotation    Knee flexion    Knee extension    Ankle dorsiflexion    Ankle plantarflexion    Ankle inversion    Ankle eversion     (Blank rows = not tested)  LOWER EXTREMITY MMT:    MMT Right eval Left eval  Hip flexion 4 4-  Hip extension    Hip abduction 4 4  Hip adduction    Hip internal rotation    Hip external rotation    Knee flexion 4- 4-  Knee extension 4 4  Ankle dorsiflexion    Ankle  plantarflexion    Ankle inversion    Ankle eversion     (Blank rows = not tested)  FUNCTIONAL TESTS:  5 times sit to stand: 32 seconds Timed up and go (TUG): 21 seconds  GAIT: Distance walked: 60 feet Assistive device utilized: None Level of assistance: Complete Independence Comments: slow, unsteady, limp on the left  TODAY'S TREATMENT:                                                                                                                              DATE:   04/24/22 Bike level 4 x 6 minutes Seated ball rollouts 5# straight arm pulls Feet on ball K2C, trunk rotation, small posterior activation and isometric abs Dead bug with cues for core stability Passive Stretch LE's Nustep level 5 x 6 minutes  04/18/22 Bike Level 4 x 5 minutes Seated pball rollouts 3 ways Nustep level 5 x 5 minutes Seated row 15# 2x10 Lats 15# 2x10 Side stepping on and off airex, airex cone toe touches for balance Feet on ball K2C, trunk rotation, small bridge and isometric abs Hooklying ball b/n knees squeeze Green tband clamshells PROM HS and piriformis SLR 2x10 bilateral cues for core engagement  04/13/22:   Therapeutic exercise:  the patient was instructed and supervised with the following exercises designed primarily to address her core/stability musculature and LE flexibility Nustep level 5, 6 min  Seated row 15#, 3 x 10 reps  Leg press 2 x 10, 25#  Supine green therapeutic ball DK2C LTR green ball   Hooklying , thighs on green ball, with alt leg lifts off ball 2" Supine for therapist applied overpressure to stretch each hip into flexion 30 sec holds, 3 reps each Seated lat pulls 30# 3 x 10  Supine dead bug alt arms and legs 10 reps Supine isometric alt hip and knee flex with UE resistance, 5 sec holds, 10 reps  Paloff press  10# then 5#, 1# increased pain R trunk Seated chest press : 10# 2 sets   04/11/22 Nustep level 5 x 6 minutes Seated  row 15# 2x10 Lats 15# 2x10 AR press 10#  2x10 Straight arm pulls a lot of cues for form and core activation 10# 2x10 Leg press 20# 2x10 Posture body mechanics about golfer's lift, workingsurface height and body mechanics for bed making, vacuuming Feet on ball K2C, trunk rotation, small posterior activation and isometric abs Passive LE stretches   PATIENT EDUCATION:  Education details: POC and HEP Person educated: Patient Education method: Consulting civil engineer, Media planner, and Handouts Education comprehension: verbalized understanding  HOME EXERCISE PROGRAM: Access Code: [email or web address] URL: [email or web address] Date: 04/03/2022 Prepared by: Lum Babe  Exercises - Supine Bridge  - 1 x daily - 7 x weekly - 2 sets - 10 reps - 3 hold - Supine Posterior Pelvic Tilt  - 1 x daily - 7 x weekly - 2 sets - 10 reps - 3 hold - Hooklying Single Knee to Chest Stretch  - 1 x daily - 7 x weekly - 2 sets - 10 reps - 3 hold - Standing Bilateral Low Shoulder Row with Anchored Resistance  - 1 x daily - 7 x weekly - 3 sets - 10 reps - 3 hold - Shoulder extension with resistance - Neutral  - 1 x daily - 7 x weekly - 3 sets - 10 reps - 3 hold  ASSESSMENT:  CLINICAL IMPRESSION: Patient is a 72 y.o. female who was is being seen  s/p lumbar fusion.  She had a fall last week, She is very sore, reports that she did not go to ED.  She reports that she is gaurded with her motions and not moving as easy, she did need some help getting up from the bed  OBJECTIVE IMPAIRMENTS: Abnormal gait, cardiopulmonary status limiting activity, decreased activity tolerance, decreased balance, decreased endurance, decreased mobility, difficulty walking, decreased ROM, decreased strength, decreased safety awareness, increased muscle spasms, impaired flexibility, improper body mechanics, postural dysfunction, and pain.     REHAB POTENTIAL: Good  CLINICAL DECISION MAKING: Evolving/moderate complexity  EVALUATION COMPLEXITY: Low   GOALS: Goals reviewed  with patient? Yes  SHORT TERM GOALS: Target date: 04/17/22  Independent with initial HEP Goal status: partially met still altering for pain LONG TERM GOALS: Target date: 07/03/22  Independent with advanced HEP Baseline:  Goal status: INITIAL  2.  Decrease TUG to 13 seconds Baseline: 22 seconds Goal status: INITIAL  3.  Be able to put shoes and socks on without difficulty  Baseline:  Goal status: INITIAL  4.  Increase LE strength to 4+/5 Baseline:  Goal status: INITIAL  5.  Be able to garden 1 hour without pain >5/10 Baseline:  Goal status: INITIAL  PLAN:  PT FREQUENCY: 2x/week  PT DURATION: 12 weeks  PLANNED INTERVENTIONS: Therapeutic exercises, Therapeutic activity, Neuromuscular re-education, Balance training, Gait training, Patient/Family education, Self Care, Joint mobilization, Dry Needling, Electrical stimulation, Cryotherapy, Moist heat, Manual therapy, and Re-evaluation.  PLAN FOR NEXT SESSION: see how she is feeling next visit   Sumner Boast, PT 04/24/2022, 1:18 PM

## 2022-04-27 ENCOUNTER — Ambulatory Visit: Payer: Medicare Other

## 2022-04-27 ENCOUNTER — Other Ambulatory Visit: Payer: Self-pay

## 2022-04-27 DIAGNOSIS — R262 Difficulty in walking, not elsewhere classified: Secondary | ICD-10-CM | POA: Diagnosis not present

## 2022-04-27 DIAGNOSIS — M62830 Muscle spasm of back: Secondary | ICD-10-CM | POA: Diagnosis not present

## 2022-04-27 DIAGNOSIS — M6281 Muscle weakness (generalized): Secondary | ICD-10-CM

## 2022-04-27 DIAGNOSIS — M5459 Other low back pain: Secondary | ICD-10-CM

## 2022-04-27 NOTE — Therapy (Signed)
OUTPATIENT PHYSICAL THERAPY THORACOLUMBAR TREATMENT   Patient Name: [name] MRN: [id number] DOB:[birth year], 72 y.o., female Today's Date: 04/27/2022  END OF SESSION:  PT End of Session - 04/27/22 1318     Visit Number 6    Date for PT Re-Evaluation 07/03/22    PT Start Time 1316    PT Stop Time 1345    PT Time Calculation (min) 29 min             Past Medical History:  Diagnosis Date   Allergy    Anemia, iron deficiency 01/22/2011   May '12 - 13.8 Hgb, Nov 2, '12 Hgb 10.9    APC (atrial premature contractions) 02/17/2018   Arthritis    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   CAP (community acquired pneumonia) 10/21/2016   Chest pain in adult 07/08/2010   March 5, '09 negative cardiolite Sept 28, '10 negative nuclear stress (cardiolite) Dec 21, '11  Negative nuclear stress Brantley Fling) June 26, '14 Negative nuclear stress - [place] with Dr. Bettina Gavia per patient report.  Overview:  CAD risk score is low at 3%   Chondromalacia patellae 12/02/2012   Chronic back pain    Chronic kidney disease    Chronic knee pain    Chronic pain 09/22/2010   Multiple sources of pain: radicular pain from lumbar disease and post-operative pain; neck pain from cervical disk disease; left knee pain from trauma.  Plan - will wean off prednisone           Will continue percocet at 1  5/325 norco every 6 hours prn           NSAID - trial of meloxicam 33mg once a day           Increase gabapentin slowly to [id number] and reassess           Executed a pain con   Chronic venous insufficiency 10/10/2013   Colitis 10/21/2016   [place] vascular [place]   Deformity of joint 04/23/2013   Depression with anxiety 09/21/2010   Derangement of knee, left 2009   started with fall at work. Has had repair patella and torn meniscus   DJD (degenerative joint disease) of knee 09/02/2012   Essential hypertension 07/08/2010   Long standing hypertension medically managed. Currently seeing Dr. HeDaneen Schickand Dr. MuMarin Robertscornerstone) for cardiology.  Meds  HCT only  Overview:  Overview:  Long standing hypertension medically managed. Currently seeing Dr. HeDaneen Schicknd Dr. MuBettina Gaviacornerstone) for cardiology.  Meds  HCT only  Last Assessment & Plan:  Formatting of this note may be different from the original. BP Readings   Fever chills 10/21/2016   Fibromyalgia 02/27/2017   High cholesterol    Hx of appendectomy    Hypertension    Hypertensive chronic kidney disease 07/08/2010   Long standing hypertension medically managed. Currently seeing Dr. HeDaneen Schicknd Dr. MuMarin Robertscornerstone) for cardiology.  Meds  HCT only  Overview:  Overview:  Long standing hypertension medically managed. Currently seeing Dr. HeDaneen Schicknd Dr. MuBettina Gaviacornerstone) for cardiology.  Meds  HCT only  Last Assessment & Plan:  Formatting of this note may be different from the original. BP Readings   Knee internal derangement 07/08/2010   Left knee: Feb '09 medial and lateral meniscal tears by MRI                  April '09 arthroscopic surgery    Melanoma ([facility]  Migraine headache    hormonal related - less frequent off hormone replacement   Migraines    Neuropathy 04/23/2013   PAT (paroxysmal atrial tachycardia) 02/17/2018   Peripheral cyanosis 09/28/2012   Full evaluation by Dr. Tobie Lords for rheumatology - no evidence of Raynaud's syndrome or disease; no evidence of underlying rheumatologic disease or malignancy. Most probable cause is neurogenic.    Pseudoarthrosis of lumbar spine 01/02/2011   Psoriasis 04/23/2013   Raynaud disease    Routine health maintenance 08/17/2011   Colonoscopy - no record in EPIC of colonoscopy  Immunizations - Tdap June '13.     Salmonella enteritis 10/23/2016   Sepsis ([place]) 10/23/2016   Spinal stenosis 08/14/2010   history of cervical laminectomy, lumbar diskectomy with fixation devices and redo surgery.    Status post lumbar spinal fusion 10/02/2011   SVT (supraventricular  tachycardia)    Synovial cyst of lumbar facet joint    lower back - s/p surgery    Tear of lateral meniscus of left knee 09/30/2012   Past Surgical History:  Procedure Laterality Date   APPENDECTOMY  1971   CERVICAL DISCECTOMY  1999   diskectomy with fixation:plate and screws   COLONOSCOPY     ESOPHAGOGASTRODUODENOSCOPY  12/2014   KNEE ARTHROSCOPY W/ MENISCAL REPAIR  09   left knee   L4-5 lumbar fusion  September 02, 2010   Dr. Hal Neer:  minimally invasive transforaminal interbody fusion with spacer   OVARIAN CYST SURGERY  1972   PATELLAR REEFING  '09   repair of fractured patella after fall   [place]  2007   lumbar spine   VENOUS ABLATION     for pain in the groin - VVTS did procedure. Has some residual discomfort at the  ablation site.    WISDOM TOOTH EXTRACTION     Patient Active Problem List   Diagnosis Date Noted   Synovial cyst of lumbar facet joint    Raynaud disease    Migraines    Migraine headache    Hypertension    Hx of appendectomy    High cholesterol    Collagen vascular disease ([place])    Chronic knee pain    Chronic kidney disease    Allergy    Arthritis    Chronic right-sided low back pain with right-sided sciatica 12/12/2018   Left-sided trigeminal neuralgia 06/03/2018   Trigeminal neuralgia of left side of face 04/30/2018   APC (atrial premature contractions) 02/17/2018   PAT (paroxysmal atrial tachycardia) 02/17/2018   Palpitations 12/24/2017   Angular cheilitis 05/10/2017   Dysphonia 05/10/2017   Laryngopharyngeal reflux (LPR) 05/10/2017   Mucous retention cyst of maxillary sinus 05/10/2017   Recurrent epistaxis 05/10/2017   Upper airway cough syndrome 04/26/2017   Fibromyalgia 02/27/2017   Sepsis ([place]) 10/23/2016   Salmonella enteritis 10/23/2016   Colitis 10/21/2016   CAP (community acquired pneumonia) 10/21/2016   Fever chills 10/21/2016   Melanoma ([place]) 11/19/2015   SVT (supraventricular tachycardia)  05/20/2015   Chronic venous insufficiency 10/10/2013   Neuropathy 04/23/2013   Psoriasis 04/23/2013   Deformity of joint 04/23/2013   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   Chondromalacia patellae 12/02/2012   Tear of lateral meniscus of left knee 09/30/2012   Peripheral cyanosis 09/28/2012   DJD (degenerative joint disease) of knee 09/02/2012   Status post lumbar spinal fusion 10/02/2011   Routine health maintenance 08/17/2011   Anemia, iron deficiency 01/22/2011   Chronic back pain 01/02/2011  Pseudoarthrosis of lumbar spine 01/02/2011   Chronic pain 09/22/2010   Depression with anxiety 09/21/2010   Spinal stenosis 08/14/2010   Hypertensive chronic kidney disease 07/08/2010   Migraine 07/08/2010   Knee internal derangement 07/08/2010   Chest pain in adult 07/08/2010   Essential hypertension 07/08/2010   Derangement of knee, left 2009    PCP: Doristine Bosworth, MD  REFERRING PROVIDER: Patrice Paradise, MD  REFERRING DIAG: S/P Lumbar fusion L1-S1  Rationale for Evaluation and Treatment: Rehabilitation  THERAPY DIAG:  Other low back pain  Muscle spasm of back  Muscle weakness (generalized)  Difficulty in walking, not elsewhere classified  ONSET DATE: 01/02/22  SUBJECTIVE:     Patient reports that she fell on Wednesday, was backing up and misstepped, she reports being very sore in the back and the ribs, did not go the MD.  Pain a 6/10  in the low back and around to the right rib area                                                                                                                                                                            SUBJECTIVE STATEMENT: `PERTINENT HISTORY:  See above  PAIN:  Are you having pain? Yes: NPRS scale: 3/10 Pain location: left low back Pain description: sharp, ache Aggravating factors: standing, leaning over, longer periods od activity, worse end of day pain up to 10/10 Relieving factors: ice, heat, pain meds 5/10 at  best  PRECAUTIONS: Back and Other: no lifting > 10#, be careful with bending and twisting  WEIGHT BEARING RESTRICTIONS: No  FALLS:  Has patient fallen in last 6 months? No  LIVING ENVIRONMENT: Lives with: lives with their family and lives with their spouse Lives in: House/apartment Stairs: No Has following equipment at home: Single point cane  OCCUPATION: retired  PLOF: Independent and gardens  PATIENT GOALS: garden, have left pain  NEXT MD VISIT:   OBJECTIVE:    PATIENT SURVEYS:  FOTO 16  SCREENING FOR RED FLAGS: Bowel or bladder incontinence: No Spinal tumors: No Cauda equina syndrome: No Compression fracture: No Abdominal aneurysm: No  COGNITION: Overall cognitive status: Within functional limits for tasks assessed     SENSATION: Reports neuropathy in her feet  MUSCLE LENGTH: Very tight HS and piriformis POSTURE: rounded shoulders and forward head  PALPATION: Tight and tender in the lumbar area and into the buttocks  Uses a rail for bed mobility  LUMBAR ROM:   AROM eval  Flexion Decreased 75%  Extension Decreased 75%  Right lateral flexion Decreased 75%  Left lateral flexion Decreased 75%  Right rotation   Left rotation    (Blank rows = not tested)  LOWER EXTREMITY ROM:  Active  Right eval Left eval  Hip flexion 70 45  Hip extension    Hip abduction    Hip adduction    Hip internal rotation    Hip external rotation    Knee flexion    Knee extension    Ankle dorsiflexion    Ankle plantarflexion    Ankle inversion    Ankle eversion     (Blank rows = not tested)  LOWER EXTREMITY MMT:    MMT Right eval Left eval  Hip flexion 4 4-  Hip extension    Hip abduction 4 4  Hip adduction    Hip internal rotation    Hip external rotation    Knee flexion 4- 4-  Knee extension 4 4  Ankle dorsiflexion    Ankle plantarflexion    Ankle inversion    Ankle eversion     (Blank rows = not tested)  FUNCTIONAL TESTS:  5 times sit to  stand: 32 seconds Timed up and go (TUG): 21 seconds  GAIT: Distance walked: 60 feet Assistive device utilized: None Level of assistance: Complete Independence Comments: slow, unsteady, limp on the left  TODAY'S TREATMENT:                                                                                                                              DATE:  04/27/22: Nutsep level 5 x 6 min UE and LE's 5 # B shoulder extension at pulley 10 x 2 Supine on mat for manual stretching for each hip for flexion, flexion with rotation  Feet on ball K2C, trunk rotation, small posterior activation and isometric abs 15 each Dead bug with cues for core stability Standing shoulder pulley rows, 10# 10 x w  Seated for hamstring stretch with strap, 30 sec holds, x 2 each leg 04/24/22  Bike level 4 x 6 minutes Seated ball rollouts 5# straight arm pulls Feet on ball K2C, trunk rotation, small posterior activation and isometric abs Dead bug with cues for core stability Passive Stretch LE's Nustep level 5 x 6 minutes  04/18/22 Bike Level 4 x 5 minutes Seated pball rollouts 3 ways Nustep level 5 x 5 minutes Seated row 15# 2x10 Lats 15# 2x10 Side stepping on and off airex, airex cone toe touches for balance Feet on ball K2C, trunk rotation, small bridge and isometric abs Hooklying ball b/n knees squeeze Green tband clamshells PROM HS and piriformis SLR 2x10 bilateral cues for core engagement  04/13/22:   Therapeutic exercise:  the patient was instructed and supervised with the following exercises designed primarily to address her core/stability musculature and LE flexibility Nustep level 5, 6 min  Seated row 15#, 3 x 10 reps  Leg press 2 x 10, 25#  Supine green therapeutic ball DK2C LTR green ball   Hooklying , thighs on green ball, with alt leg lifts off ball 2" Supine for therapist applied overpressure to stretch each hip into flexion 30 sec  holds, 3 reps each Seated lat pulls 30# 3 x 10  Supine  dead bug alt arms and legs 10 reps Supine isometric alt hip and knee flex with UE resistance, 5 sec holds, 10 reps  Paloff press  10# then 5#, 1# increased pain R trunk Seated chest press : 10# 2 sets   04/11/22 Nustep level 5 x 6 minutes Seated row 15# 2x10 Lats 15# 2x10 AR press 10# 2x10 Straight arm pulls a lot of cues for form and core activation 10# 2x10 Leg press 20# 2x10 Posture body mechanics about golfer's lift, workingsurface height and body mechanics for bed making, vacuuming Feet on ball K2C, trunk rotation, small posterior activation and isometric abs Passive LE stretches   PATIENT EDUCATION:  Education details: POC and HEP Person educated: Patient Education method: Consulting civil engineer, Media planner, and Handouts Education comprehension: verbalized understanding  HOME EXERCISE PROGRAM: Access Code: [email or web address] URL: [email or web address] Date: 04/03/2022 Prepared by: Lum Babe  Exercises - Supine Bridge  - 1 x daily - 7 x weekly - 2 sets - 10 reps - 3 hold - Supine Posterior Pelvic Tilt  - 1 x daily - 7 x weekly - 2 sets - 10 reps - 3 hold - Hooklying Single Knee to Chest Stretch  - 1 x daily - 7 x weekly - 2 sets - 10 reps - 3 hold - Standing Bilateral Low Shoulder Row with Anchored Resistance  - 1 x daily - 7 x weekly - 3 sets - 10 reps - 3 hold - Shoulder extension with resistance - Neutral  - 1 x daily - 7 x weekly - 3 sets - 10 reps - 3 hold  ASSESSMENT:  CLINICAL IMPRESSION: Patient is a 72 y.o. female who was is being seen  s/p lumbar fusion.  Less sore than at last visit.  She was a little late today, was confused about her appt time.  Tolerated all stretches, therex well.   OBJECTIVE IMPAIRMENTS: Abnormal gait, cardiopulmonary status limiting activity, decreased activity tolerance, decreased balance, decreased endurance, decreased mobility, difficulty walking, decreased ROM, decreased strength, decreased safety awareness, increased muscle  spasms, impaired flexibility, improper body mechanics, postural dysfunction, and pain.     REHAB POTENTIAL: Good  CLINICAL DECISION MAKING: Evolving/moderate complexity  EVALUATION COMPLEXITY: Low   GOALS: Goals reviewed with patient? Yes  SHORT TERM GOALS: Target date: 04/17/22  Independent with initial HEP Goal status: partially met still altering for pain LONG TERM GOALS: Target date: 07/03/22  Independent with advanced HEP Baseline:  Goal status: INITIAL  2.  Decrease TUG to 13 seconds Baseline: 22 seconds Goal status: INITIAL  3.  Be able to put shoes and socks on without difficulty  Baseline:  Goal status: INITIAL  4.  Increase LE strength to 4+/5 Baseline:  Goal status: INITIAL  5.  Be able to garden 1 hour without pain >5/10 Baseline:  Goal status: INITIAL  PLAN:  PT FREQUENCY: 2x/week  PT DURATION: 12 weeks  PLANNED INTERVENTIONS: Therapeutic exercises, Therapeutic activity, Neuromuscular re-education, Balance training, Gait training, Patient/Family education, Self Care, Joint mobilization, Dry Needling, Electrical stimulation, Cryotherapy, Moist heat, Manual therapy, and Re-evaluation.  PLAN FOR NEXT SESSION: see how she is feeling next visit   Beauden Tremont L Kimi Kroft, PT 04/27/2022, 2:49 PM

## 2022-05-02 ENCOUNTER — Encounter: Payer: Self-pay | Admitting: Physical Therapy

## 2022-05-02 ENCOUNTER — Ambulatory Visit: Payer: Medicare Other | Admitting: Physical Therapy

## 2022-05-02 DIAGNOSIS — M6281 Muscle weakness (generalized): Secondary | ICD-10-CM

## 2022-05-02 DIAGNOSIS — R262 Difficulty in walking, not elsewhere classified: Secondary | ICD-10-CM | POA: Diagnosis not present

## 2022-05-02 DIAGNOSIS — M62830 Muscle spasm of back: Secondary | ICD-10-CM | POA: Diagnosis not present

## 2022-05-02 DIAGNOSIS — M5459 Other low back pain: Secondary | ICD-10-CM

## 2022-05-02 NOTE — Therapy (Signed)
OUTPATIENT PHYSICAL THERAPY THORACOLUMBAR TREATMENT   Patient Name: [name] MRN: [id number] DOB:10-28-1950, 72 y.o., female Today's Date: 05/02/2022  END OF SESSION:  PT End of Session - 05/02/22 1351     Visit Number 7    Date for PT Re-Evaluation 07/03/22    Authorization Type [employer]    PT Start Time 1351    PT Stop Time 1440    PT Time Calculation (min) 49 min    Activity Tolerance Patient tolerated treatment well    Behavior During Therapy [facility] for tasks assessed/performed             Past Medical History:  Diagnosis Date   Allergy    Anemia, iron deficiency 01/22/2011   May '12 - 13.8 Hgb, Nov 2, '12 Hgb 10.9    APC (atrial premature contractions) 02/17/2018   Arthritis    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   CAP (community acquired pneumonia) 10/21/2016   Chest pain in adult 07/08/2010   March 5, '09 negative cardiolite Sept 28, '10 negative nuclear stress (cardiolite) Dec 21, '11  Negative nuclear stress Brantley Fling) June 26, '14 Negative nuclear stress - [place] with Dr. Bettina Gavia per patient report.  Overview:  CAD risk score is low at 3%   Chondromalacia patellae 12/02/2012   Chronic back pain    Chronic kidney disease    Chronic knee pain    Chronic pain 09/22/2010   Multiple sources of pain: radicular pain from lumbar disease and post-operative pain; neck pain from cervical disk disease; left knee pain from trauma.  Plan - will wean off prednisone           Will continue percocet at 1  5/325 norco every 6 hours prn           NSAID - trial of meloxicam 57mg once a day           Increase gabapentin slowly to 6061mtid and reassess           Executed a pain con   Chronic venous insufficiency 10/10/2013   Colitis 10/21/2016   [place] vascular disease [place]   Deformity of joint 04/23/2013   Depression with anxiety 09/21/2010   Derangement of knee, left 2009   started with fall at work. Has had repair patella and torn meniscus   DJD (degenerative  joint disease) of knee 09/02/2012   Essential hypertension 07/08/2010   Long standing hypertension medically managed. Currently seeing Dr. HeDaneen Schicknd Dr. MuMarin Robertscornerstone) for cardiology.  Meds  HCT only  Overview:  Overview:  Long standing hypertension medically managed. Currently seeing Dr. HeDaneen Schicknd Dr. MuBettina Gaviacornerstone) for cardiology.  Meds  HCT only  Last Assessment & Plan:  Formatting of this note may be different from the original. BP Readings   Fever chills 10/21/2016   Fibromyalgia 02/27/2017   High cholesterol    Hx of appendectomy    Hypertension    Hypertensive chronic kidney disease 07/08/2010   Long standing hypertension medically managed. Currently seeing Dr. HeDaneen Schicknd Dr. MuMarin Robertscornerstone) for cardiology.  Meds  HCT only  Overview:  Overview:  Long standing hypertension medically managed. Currently seeing Dr. HeDaneen Schicknd Dr. MuBettina Gaviacornerstone) for cardiology.  Meds  HCT only  Last Assessment & Plan:  Formatting of this note may be different from the original. BP Readings   Knee internal derangement 07/08/2010   Left knee: Feb '09 medial and lateral meniscal tears by MRI  April '09 arthroscopic surgery    Melanoma ([place]) 11/19/2015   Migraine headache    hormonal related - less frequent off hormone replacement   Migraines    Neuropathy 04/23/2013   PAT (paroxysmal atrial tachycardia) 02/17/2018   Peripheral cyanosis 09/28/2012   Full evaluation by Dr. Tobie Lords for rheumatology - no evidence of Raynaud's syndrome or disease; no evidence of underlying rheumatologic disease or malignancy. Most probable cause is neurogenic.    Pseudoarthrosis of lumbar spine 01/02/2011   Psoriasis 04/23/2013   Raynaud disease    Routine health maintenance 08/17/2011   Colonoscopy - no record in EPIC of colonoscopy  Immunizations - Tdap June '13.     Salmonella enteritis 10/23/2016   Sepsis ([place]) 10/23/2016   Spinal stenosis 08/14/2010   history of cervical  laminectomy, lumbar diskectomy with fixation devices and redo surgery.    Status post lumbar spinal fusion 10/02/2011   SVT (supraventricular tachycardia)    Synovial cyst of lumbar facet joint    lower back - s/p surgery    Tear of lateral meniscus of left knee 09/30/2012   Past Surgical History:  Procedure Laterality Date   APPENDECTOMY  1971   CERVICAL DISCECTOMY  1999   diskectomy with fixation:plate and screws   COLONOSCOPY     ESOPHAGOGASTRODUODENOSCOPY  12/2014   KNEE ARTHROSCOPY W/ MENISCAL REPAIR  09   left knee   L4-5 lumbar fusion  September 02, 2010   Dr. Hal Neer:  minimally invasive transforaminal interbody fusion with spacer   OVARIAN CYST SURGERY  1972   PATELLAR REEFING  '09   repair of fractured patella after fall   [place]  2007   lumbar spine   VENOUS ABLATION     for pain in the groin - VVTS did procedure. Has some residual discomfort at the  ablation site.    WISDOM TOOTH EXTRACTION     Patient Active Problem List   Diagnosis Date Noted   Synovial cyst of lumbar facet joint    Raynaud disease    Migraines    Migraine headache    Hypertension    Hx of appendectomy    High cholesterol    Collagen vascular disease ([place])    Chronic knee pain    Chronic kidney disease    Allergy    Arthritis    Chronic right-sided low back pain with right-sided sciatica 12/12/2018   Left-sided trigeminal neuralgia 06/03/2018   Trigeminal neuralgia of left side of face 04/30/2018   APC (atrial premature contractions) 02/17/2018   PAT (paroxysmal atrial tachycardia) 02/17/2018   Palpitations 12/24/2017   Angular cheilitis 05/10/2017   Dysphonia 05/10/2017   Laryngopharyngeal reflux (LPR) 05/10/2017   Mucous retention cyst of maxillary sinus 05/10/2017   Recurrent epistaxis 05/10/2017   Upper airway cough syndrome 04/26/2017   Fibromyalgia 02/27/2017   Sepsis ([place]) 10/23/2016   Salmonella enteritis 10/23/2016   Colitis 10/21/2016   CAP  (community acquired pneumonia) 10/21/2016   Fever chills 10/21/2016   Melanoma ([place]) 11/19/2015   SVT (supraventricular tachycardia) 05/20/2015   Chronic venous insufficiency 10/10/2013   Neuropathy 04/23/2013   Psoriasis 04/23/2013   Deformity of joint 04/23/2013   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   Chondromalacia patellae 12/02/2012   Tear of lateral meniscus of left knee 09/30/2012   Peripheral cyanosis 09/28/2012   DJD (degenerative joint disease) of knee 09/02/2012   Status post lumbar spinal fusion 10/02/2011   Routine health maintenance 08/17/2011  Anemia, iron deficiency 01/22/2011   Chronic back pain 01/02/2011   Pseudoarthrosis of lumbar spine 01/02/2011   Chronic pain 09/22/2010   Depression with anxiety 09/21/2010   Spinal stenosis 08/14/2010   Hypertensive chronic kidney disease 07/08/2010   Migraine 07/08/2010   Knee internal derangement 07/08/2010   Chest pain in adult 07/08/2010   Essential hypertension 07/08/2010   Derangement of knee, left 2009    PCP: Doristine Bosworth, MD  REFERRING PROVIDER: Patrice Paradise, MD  REFERRING DIAG: S/P Lumbar fusion L1-S1  Rationale for Evaluation and Treatment: Rehabilitation  THERAPY DIAG:  Other low back pain  Muscle spasm of back  Muscle weakness (generalized)  Difficulty in walking, not elsewhere classified  ONSET DATE: 01/02/22  SUBJECTIVE:     Patient reports that she is feeling a little better overall after the fall that she took, reports a little stiff and rates pain a 4-5/10                                                                                                              SUBJECTIVE STATEMENT: `PERTINENT HISTORY:  See above  PAIN:  Are you having pain? Yes: NPRS scale: 3/10 Pain location: left low back Pain description: sharp, ache Aggravating factors: standing, leaning over, longer periods od activity, worse end of day pain up to 10/10 Relieving factors: ice, heat, pain meds 5/10 at  best  PRECAUTIONS: Back and Other: no lifting > 10#, be careful with bending and twisting  WEIGHT BEARING RESTRICTIONS: No  FALLS:  Has patient fallen in last 6 months? No  LIVING ENVIRONMENT: Lives with: lives with their family and lives with their spouse Lives in: House/apartment Stairs: No Has following equipment at home: Single point cane  OCCUPATION: retired  PLOF: Independent and gardens  PATIENT GOALS: garden, have left pain  NEXT MD VISIT:   OBJECTIVE:    PATIENT SURVEYS:  FOTO 67  SCREENING FOR RED FLAGS: Bowel or bladder incontinence: No Spinal tumors: No Cauda equina syndrome: No Compression fracture: No Abdominal aneurysm: No  COGNITION: Overall cognitive status: Within functional limits for tasks assessed     SENSATION: Reports neuropathy in her feet  MUSCLE LENGTH: Very tight HS and piriformis POSTURE: rounded shoulders and forward head  PALPATION: Tight and tender in the lumbar area and into the buttocks  Uses a rail for bed mobility  LUMBAR ROM:   AROM eval  Flexion Decreased 75%  Extension Decreased 75%  Right lateral flexion Decreased 75%  Left lateral flexion Decreased 75%  Right rotation   Left rotation    (Blank rows = not tested)  LOWER EXTREMITY ROM:     Active  Right eval Left eval  Hip flexion 70 45  Hip extension    Hip abduction    Hip adduction    Hip internal rotation    Hip external rotation    Knee flexion    Knee extension    Ankle dorsiflexion    Ankle plantarflexion    Ankle inversion    Ankle eversion     (  Blank rows = not tested)  LOWER EXTREMITY MMT:    MMT Right eval Left eval  Hip flexion 4 4-  Hip extension    Hip abduction 4 4  Hip adduction    Hip internal rotation    Hip external rotation    Knee flexion 4- 4-  Knee extension 4 4  Ankle dorsiflexion    Ankle plantarflexion    Ankle inversion    Ankle eversion     (Blank rows = not tested)  FUNCTIONAL TESTS:  5 times sit to  stand: 32 seconds Timed up and go (TUG): 21 seconds  GAIT: Distance walked: 60 feet Assistive device utilized: None Level of assistance: Complete Independence Comments: slow, unsteady, limp on the left  TODAY'S TREATMENT:                                                                                                                              DATE:  05/02/22 Walk [place] around [place] a few times with CGA due to unsteadiness on the slopes Worked on car transfers she reports a lot of difficulty with this and she does have some struggles so we talked about how to do correctly and then how she was doing it Nustep level 5 x 6 mintues 2.5# hip marches, abduction and extension with walker 2.5# SLR in standing to try to help hip flexion strength for getting in the car Supine feet on ball K2C, trunk rotation, small posterior activation and then isometric abs Stretches of the HS, piriformis and the hip flexor  04/27/22: Nutsep level 5 x 6 min UE and LE's 5 # B shoulder extension at pulley 10 x 2 Supine on mat for manual stretching for each hip for flexion, flexion with rotation  Feet on ball K2C, trunk rotation, small posterior activation and isometric abs 15 each Dead bug with cues for core stability Standing shoulder pulley rows, 10# 10 x w  Seated for hamstring stretch with strap, 30 sec holds, x 2 each leg 04/24/22  Bike level 4 x 6 minutes Seated ball rollouts 5# straight arm pulls Feet on ball K2C, trunk rotation, small posterior activation and isometric abs Dead bug with cues for core stability Passive Stretch LE's Nustep level 5 x 6 minutes  04/18/22 Bike Level 4 x 5 minutes Seated pball rollouts 3 ways Nustep level 5 x 5 minutes Seated row 15# 2x10 Lats 15# 2x10 Side stepping on and off airex, airex cone toe touches for balance Feet on ball K2C, trunk rotation, small bridge and isometric abs Hooklying ball b/n knees squeeze Green tband clamshells PROM HS and  piriformis SLR 2x10 bilateral cues for core engagement  04/13/22:   Therapeutic exercise:  the patient was instructed and supervised with the following exercises designed primarily to address her core/stability musculature and LE flexibility Nustep level 5, 6 min  Seated row 15#, 3 x 10 reps  Leg press 2 x 10, 25#  Supine green therapeutic ball DK2C LTR green ball   Hooklying , thighs on green ball, with alt leg lifts off ball 2" Supine for therapist applied overpressure to stretch each hip into flexion 30 sec holds, 3 reps each Seated lat pulls 30# 3 x 10  Supine dead bug alt arms and legs 10 reps Supine isometric alt hip and knee flex with UE resistance, 5 sec holds, 10 reps  Paloff press  10# then 5#, 1# increased pain R trunk Seated chest press : 10# 2 sets   04/11/22 Nustep level 5 x 6 minutes Seated row 15# 2x10 Lats 15# 2x10 AR press 10# 2x10 Straight arm pulls a lot of cues for form and core activation 10# 2x10 Leg press 20# 2x10 Posture body mechanics about golfer's lift, workingsurface height and body mechanics for bed making, vacuuming Feet on ball K2C, trunk rotation, small posterior activation and isometric abs Passive LE stretches   PATIENT EDUCATION:  Education details: POC and HEP Person educated: Patient Education method: Consulting civil engineer, Media planner, and Handouts Education comprehension: verbalized understanding  HOME EXERCISE PROGRAM: Access Code: [email or web address] URL: [email or web address] Date: 04/03/2022 Prepared by: Lum Babe  Exercises - Supine Bridge  - 1 x daily - 7 x weekly - 2 sets - 10 reps - 3 hold - Supine Posterior Pelvic Tilt  - 1 x daily - 7 x weekly - 2 sets - 10 reps - 3 hold - Hooklying Single Knee to Chest Stretch  - 1 x daily - 7 x weekly - 2 sets - 10 reps - 3 hold - Standing Bilateral Low Shoulder Row with Anchored Resistance  - 1 x daily - 7 x weekly - 3 sets - 10 reps - 3 hold - Shoulder extension with resistance -  Neutral  - 1 x daily - 7 x weekly - 3 sets - 10 reps - 3 hold  ASSESSMENT:  CLINICAL IMPRESSION: Patient is a 72 y.o. female who was is being seen  s/p lumbar fusion.  Patient is recovering from a fall about two weeks ago and has been very sore in the back and the ribs, we did some walking today and worked on car transfers, she really wants to get in by throwing the right foot up and getting in the car in one fell swoop, we talked about her back and doing a sit and then bring legs in to decrease stress and strain on the back, her LE's are tight and her hips are weak.  She is c/o right shoulder pain so I did not do many exercises that involved the shoulders  OBJECTIVE IMPAIRMENTS: Abnormal gait, cardiopulmonary status limiting activity, decreased activity tolerance, decreased balance, decreased endurance, decreased mobility, difficulty walking, decreased ROM, decreased strength, decreased safety awareness, increased muscle spasms, impaired flexibility, improper body mechanics, postural dysfunction, and pain.     REHAB POTENTIAL: Good  CLINICAL DECISION MAKING: Evolving/moderate complexity  EVALUATION COMPLEXITY: Low   GOALS: Goals reviewed with patient? Yes  SHORT TERM GOALS: Target date: 04/17/22  Independent with initial HEP Goal status: partially met still altering for pain LONG TERM GOALS: Target date: 07/03/22  Independent with advanced HEP Baseline:  Goal status: INITIAL  2.  Decrease TUG to 13 seconds Baseline: 22 seconds Goal status: INITIAL  3.  Be able to put shoes and socks on without difficulty  Baseline:  Goal status: progressing 05/02/22  4.  Increase LE strength to 4+/5 Baseline:  Goal status: progressing 05/02/22  5.  Be able to garden 1 hour  without pain >5/10 Baseline:  Goal status:progressing 05/02/22  PLAN:  PT FREQUENCY: 2x/week  PT DURATION: 12 weeks  PLANNED INTERVENTIONS: Therapeutic exercises, Therapeutic activity, Neuromuscular re-education,  Balance training, Gait training, Patient/Family education, Self Care, Joint mobilization, Dry Needling, Electrical stimulation, Cryotherapy, Moist heat, Manual therapy, and Re-evaluation.  PLAN FOR NEXT SESSION:continue to progress but limit her pain   Sumner Boast, PT 05/02/2022, 1:52 PM

## 2022-05-04 ENCOUNTER — Encounter: Payer: Self-pay | Admitting: Physical Therapy

## 2022-05-04 ENCOUNTER — Ambulatory Visit: Payer: Medicare Other | Admitting: Physical Therapy

## 2022-05-04 DIAGNOSIS — R262 Difficulty in walking, not elsewhere classified: Secondary | ICD-10-CM

## 2022-05-04 DIAGNOSIS — M6281 Muscle weakness (generalized): Secondary | ICD-10-CM

## 2022-05-04 DIAGNOSIS — M62830 Muscle spasm of back: Secondary | ICD-10-CM | POA: Diagnosis not present

## 2022-05-04 DIAGNOSIS — M5459 Other low back pain: Secondary | ICD-10-CM | POA: Diagnosis not present

## 2022-05-04 NOTE — Therapy (Signed)
OUTPATIENT PHYSICAL THERAPY THORACOLUMBAR TREATMENT   Patient Name: [name] MRN: [id number] DOB:1950-11-01, 72 y.o., female Today's Date: 05/04/2022  END OF SESSION:  PT End of Session - 05/04/22 1358     Visit Number 8    Date for PT Re-Evaluation 07/03/22    Authorization Type [employer]    PT Start Time 1350    PT Stop Time 1435    PT Time Calculation (min) 45 min    Activity Tolerance Patient tolerated treatment well    Behavior During Therapy [facility] for tasks assessed/performed             Past Medical History:  Diagnosis Date   Allergy    Anemia, iron deficiency 01/22/2011   May '12 - 13.8 Hgb, Nov 2, '12 Hgb 10.9    APC (atrial premature contractions) 02/17/2018   Arthritis    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   CAP (community acquired pneumonia) 10/21/2016   Chest pain in adult 07/08/2010   March 5, '09 negative cardiolite Sept 28, '10 negative nuclear stress (cardiolite) Dec 21, '11  Negative nuclear stress Brant[facility] Fling) June 26, '14 Negative nuclear stress - [place] with Dr. Bettina Gavia per patient report.  Overview:  CAD risk score is low at 3%   Chondromalacia patellae 12/02/2012   Chronic back pain    Chronic kidney disease    Chronic knee pain    Chronic pain 09/22/2010   Multiple sources of pain: radicular pain from lumbar disease and post-operative pain; neck pain from cervical disk disease; left knee pain from trauma.  Plan - will wean off prednisone           Will continue percocet at 1  5/325 norco every 6 hours prn           NSAID - trial of meloxicam 4mg once a day           Increase gabapentin slowly to 6060mtid and reassess           Executed a pain con   Chronic venous insufficiency 10/10/2013   Colitis 10/21/2016   Collagen vascular disease ([place]   Deformity of joint 04/23/2013   Depression with anxiety 09/21/2010   Derangement of knee, left 2009   started with fall at work. Has had repair patella and torn meniscus   DJD (degenerative  joint disease) of knee 09/02/2012   Essential hypertension 07/08/2010   Long standing hypertension medically managed. Currently seeing Dr. HeDaneen Schicknd Dr. MuMarin Robertscornerstone) for cardiology.  Meds  HCT only  Overview:  Overview:  Long standing hypertension medically managed. Currently seeing Dr. HeDaneen Schicknd Dr. MuBettina Gaviacornerstone) for cardiology.  Meds  HCT only  Last Assessment & Plan:  Formatting of this note may be different from the original. BP Readings   Fever chills 10/21/2016   Fibromyalgia 02/27/2017   High cholesterol    Hx of appendectomy    Hypertension    Hypertensive chronic kidney disease 07/08/2010   Long standing hypertension medically managed. Currently seeing Dr. HeDaneen Schicknd Dr. MuMarin Robertscornerstone) for cardiology.  Meds  HCT only  Overview:  Overview:  Long standing hypertension medically managed. Currently seeing Dr. HeDaneen Schicknd Dr. MuBettina Gaviacornerstone) for cardiology.  Meds  HCT only  Last Assessment & Plan:  Formatting of this note may be different from the original. BP Readings   Knee internal derangement 07/08/2010   Left knee: Feb '09 medial and lateral meniscal tears by MRI  April '09 arthroscopic surgery    Melanoma ([place]) 11/19/2015   Migraine headache    hormonal related - less frequent off hormone replacement   Migraines    Neuropathy 04/23/2013   PAT (paroxysmal atrial tachycardia) 02/17/2018   Peripheral cyanosis 09/28/2012   Full evaluation by Dr. Tobie Lords for rheumatology - no evidence of Raynaud's syndrome or disease; no evidence of underlying rheumatologic disease or malignancy. Most probable cause is neurogenic.    Pseudoarthrosis of lumbar spine 01/02/2011   Psoriasis 04/23/2013   Raynaud disease    Routine health maintenance 08/17/2011   Colonoscopy - no record in EPIC of colonoscopy  Immunizations - Tdap June '13.     Salmonella enteritis 10/23/2016   Sepsis ([place]) 10/23/2016   Spinal stenosis 08/14/2010   history of cervical  laminectomy, lumbar diskectomy with fixation devices and redo surgery.    Status post lumbar spinal fusion 10/02/2011   SVT (supraventricular tachycardia)    Synovial cyst of lumbar facet joint    lower back - s/p surgery    Tear of lateral meniscus of left knee 09/30/2012   Past Surgical History:  Procedure Laterality Date   APPENDECTOMY  1971   CERVICAL DISCECTOMY  1999   diskectomy with fixation:plate and screws   COLONOSCOPY     ESOPHAGOGASTRODUODENOSCOPY  12/2014   KNEE ARTHROSCOPY W/ MENISCAL REPAIR  09   left knee   L4-5 lumbar fusion  September 02, 2010   Dr. Hal Neer:  minimally invasive transforaminal interbody fusion with spacer   OVARIAN CYST SURGERY  1972   PATELLAR REEFING  '09   repair of fractured patella after fall   [place]  2007   lumbar spine   VENOUS ABLATION     for pain in the groin - VVTS did procedure. Has some residual discomfort at the  ablation site.    WISDOM TOOTH EXTRACTION     Patient Active Problem List   Diagnosis Date Noted   Synovial cyst of lumbar facet joint    Raynaud disease    Migraines    Migraine headache    Hypertension    Hx of appendectomy    High cholesterol    Collagen vascular disease ([place])    Chronic knee pain    Chronic kidney disease    Allergy    Arthritis    Chronic right-sided low back pain with right-sided sciatica 12/12/2018   Left-sided trigeminal neuralgia 06/03/2018   Trigeminal neuralgia of left side of face 04/30/2018   APC (atrial premature contractions) 02/17/2018   PAT (paroxysmal atrial tachycardia) 02/17/2018   Palpitations 12/24/2017   Angular cheilitis 05/10/2017   Dysphonia 05/10/2017   Laryngopharyngeal reflux (LPR) 05/10/2017   Mucous retention cyst of maxillary sinus 05/10/2017   Recurrent epistaxis 05/10/2017   Upper airway cough syndrome 04/26/2017   Fibromyalgia 02/27/2017   Sepsis ([place]) 10/23/2016   Salmonella enteritis 10/23/2016   Colitis 10/21/2016   CAP  (community acquired pneumonia) 10/21/2016   Fever chills 10/21/2016   Melanoma ([place]) 11/19/2015   SVT (supraventricular tachycardia) 05/20/2015   Chronic venous insufficiency 10/10/2013   Neuropathy 04/23/2013   Psoriasis 04/23/2013   Deformity of joint 04/23/2013   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   Chondromalacia patellae 12/02/2012   Tear of lateral meniscus of left knee 09/30/2012   Peripheral cyanosis 09/28/2012   DJD (degenerative joint disease) of knee 09/02/2012   Status post lumbar spinal fusion 10/02/2011   Routine health maintenance 08/17/2011  Anemia, iron deficiency 01/22/2011   Chronic back pain 01/02/2011   Pseudoarthrosis of lumbar spine 01/02/2011   Chronic pain 09/22/2010   Depression with anxiety 09/21/2010   Spinal stenosis 08/14/2010   Hypertensive chronic kidney disease 07/08/2010   Migraine 07/08/2010   Knee internal derangement 07/08/2010   Chest pain in adult 07/08/2010   Essential hypertension 07/08/2010   Derangement of knee, left 2009    PCP: Doristine Bosworth, MD  REFERRING PROVIDER: Patrice Paradise, MD  REFERRING DIAG: S/P Lumbar fusion L1-S1  Rationale for Evaluation and Treatment: Rehabilitation  THERAPY DIAG:  Other low back pain  Muscle spasm of back  Muscle weakness (generalized)  Difficulty in walking, not elsewhere classified  ONSET DATE: 01/02/22  SUBJECTIVE:     Having some right low back and right buttock pain.  She feels that we are progressing her well just thought that some of the stretching may be too much. She does report fatigue.                                                                                              SUBJECTIVE STATEMENT: `PERTINENT HISTORY:  See above  PAIN:  Are you having pain? Yes: NPRS scale: 4/10 Pain location: right low back Pain description: sharp, ache Aggravating factors: standing, leaning over, longer periods od activity, worse end of day pain up to 10/10 Relieving factors: ice, heat,  pain meds 5/10 at best  PRECAUTIONS: Back and Other: no lifting > 10#, be careful with bending and twisting  WEIGHT BEARING RESTRICTIONS: No  FALLS:  Has patient fallen in last 6 months? No  LIVING ENVIRONMENT: Lives with: lives with their family and lives with their spouse Lives in: House/apartment Stairs: No Has following equipment at home: Single point cane  OCCUPATION: retired  PLOF: Independent and gardens  PATIENT GOALS: garden, have left pain  NEXT MD VISIT:   OBJECTIVE:    PATIENT SURVEYS:  FOTO 64  SCREENING FOR RED FLAGS: Bowel or bladder incontinence: No Spinal tumors: No Cauda equina syndrome: No Compression fracture: No Abdominal aneurysm: No  COGNITION: Overall cognitive status: Within functional limits for tasks assessed     SENSATION: Reports neuropathy in her feet  MUSCLE LENGTH: Very tight HS and piriformis POSTURE: rounded shoulders and forward head  PALPATION: Tight and tender in the lumbar area and into the buttocks  Uses a rail for bed mobility  LUMBAR ROM:   AROM eval  Flexion Decreased 75%  Extension Decreased 75%  Right lateral flexion Decreased 75%  Left lateral flexion Decreased 75%  Right rotation   Left rotation    (Blank rows = not tested)  LOWER EXTREMITY ROM:     Active  Right eval Left eval  Hip flexion 70 45  Hip extension    Hip abduction    Hip adduction    Hip internal rotation    Hip external rotation    Knee flexion    Knee extension    Ankle dorsiflexion    Ankle plantarflexion    Ankle inversion    Ankle eversion     (Blank rows = not tested)  LOWER EXTREMITY MMT:    MMT Right eval Left eval  Hip flexion 4 4-  Hip extension    Hip abduction 4 4  Hip adduction    Hip internal rotation    Hip external rotation    Knee flexion 4- 4-  Knee extension 4 4  Ankle dorsiflexion    Ankle plantarflexion    Ankle inversion    Ankle eversion     (Blank rows = not tested)  FUNCTIONAL TESTS:   5 times sit to stand: 32 seconds Timed up and go (TUG): 21 seconds  GAIT: Distance walked: 60 feet Assistive device utilized: None Level of assistance: Complete Independence Comments: slow, unsteady, limp on the left  TODAY'S TREATMENT:                                                                                                                              DATE:  05/04/22 Walk outside around the parking island and back to the front door, did fatigue and slow with some shuffling coming up the slope Nustep level 5 x  minutes 12" toe clears, light hand hold Hip abduction in pbars, with right she had pain in the right low back a 5/10, also did hip extension 2x10 some pain but less than abduction 10# straight arm pulls 10# AR press Feet on ball K2C, trunk rotation, posterior isometrics, abdominal isometrics Green tband hooklying clamshells STM with theragun with her in left sidelying working in the right lumbar and buttock mms  05/02/22 Walk outside around the parking island a few times with CGA due to unsteadiness on the slopes Worked on car transfers she reports a lot of difficulty with this and she does have some struggles so we talked about how to do correctly and then how she was doing it Nustep level 5 x 6 mintues 2.5# hip marches, abduction and extension with walker 2.5# SLR in standing to try to help hip flexion strength for getting in the car Supine feet on ball K2C, trunk rotation, small posterior activation and then isometric abs Stretches of the HS, piriformis and the hip flexor  04/27/22: Nutsep level 5 x 6 min UE and LE's 5 # B shoulder extension at pulley 10 x 2 Supine on mat for manual stretching for each hip for flexion, flexion with rotation  Feet on ball K2C, trunk rotation, small posterior activation and isometric abs 15 each Dead bug with cues for core stability Standing shoulder pulley rows, 10# 10 x w  Seated for hamstring stretch with strap, 30 sec holds, x  2 each leg 04/24/22  Bike level 4 x 6 minutes Seated ball rollouts 5# straight arm pulls Feet on ball K2C, trunk rotation, small posterior activation and isometric abs Dead bug with cues for core stability Passive Stretch LE's Nustep level 5 x 6 minutes  04/18/22 Bike Level 4 x 5 minutes Seated pball rollouts 3 ways Nustep level 5 x 5 minutes Seated row  15# 2x10 Lats 15# 2x10 Side stepping on and off airex, airex cone toe touches for balance Feet on ball K2C, trunk rotation, small bridge and isometric abs Hooklying ball b/n knees squeeze Green tband clamshells PROM HS and piriformis SLR 2x10 bilateral cues for core engagement  04/13/22:   Therapeutic exercise:  the patient was instructed and supervised with the following exercises designed primarily to address her core/stability musculature and LE flexibility Nustep level 5, 6 min  Seated row 15#, 3 x 10 reps  Leg press 2 x 10, 25#  Supine green therapeutic ball DK2C LTR green ball   Hooklying , thighs on green ball, with alt leg lifts off ball 2" Supine for therapist applied overpressure to stretch each hip into flexion 30 sec holds, 3 reps each Seated lat pulls 30# 3 x 10  Supine dead bug alt arms and legs 10 reps Supine isometric alt hip and knee flex with UE resistance, 5 sec holds, 10 reps  Paloff press  10# then 5#, 1# increased pain R trunk Seated chest press : 10# 2 sets   PATIENT EDUCATION:  Education details: POC and HEP Person educated: Patient Education method: Explanation, Media planner, and Handouts Education comprehension: verbalized understanding  HOME EXERCISE PROGRAM: Access Code: [email or web address] URL: [email or web address] Date: 04/03/2022 Prepared by: Lum Babe  Exercises - Supine Bridge  - 1 x daily - 7 x weekly - 2 sets - 10 reps - 3 hold - Supine Posterior Pelvic Tilt  - 1 x daily - 7 x weekly - 2 sets - 10 reps - 3 hold - Hooklying Single Knee to Chest Stretch  - 1 x daily - 7 x  weekly - 2 sets - 10 reps - 3 hold - Standing Bilateral Low Shoulder Row with Anchored Resistance  - 1 x daily - 7 x weekly - 3 sets - 10 reps - 3 hold - Shoulder extension with resistance - Neutral  - 1 x daily - 7 x weekly - 3 sets - 10 reps - 3 hold  ASSESSMENT:  CLINICAL IMPRESSION: Patient is a 72 y.o. female who was is being seen  s/p lumbar fusion.  Patient is recovering from a fall about two weeks ago and has been very sore in the back and the ribs, we continued the walking today, I did not stretch her today as she thought the stretching was bothering her some, we also added some hip exercises and hip abduction and extension with the right caused increased right back pain, I did try the STM with the theragun in this area to see if this would help  OBJECTIVE IMPAIRMENTS: Abnormal gait, cardiopulmonary status limiting activity, decreased activity tolerance, decreased balance, decreased endurance, decreased mobility, difficulty walking, decreased ROM, decreased strength, decreased safety awareness, increased muscle spasms, impaired flexibility, improper body mechanics, postural dysfunction, and pain.    REHAB POTENTIAL: Good  CLINICAL DECISION MAKING: Evolving/moderate complexity  EVALUATION COMPLEXITY: Low   GOALS: Goals reviewed with patient? Yes  SHORT TERM GOALS: Target date: 04/17/22  Independent with initial HEP Goal status: met 05/04/22 LONG TERM GOALS: Target date: 07/03/22  Independent with advanced HEP Baseline:  Goal status: INITIAL  2.  Decrease TUG to 13 seconds Baseline: 22 seconds Goal status: INITIAL  3.  Be able to put shoes and socks on without difficulty  Baseline:  Goal status: progressing 05/02/22  4.  Increase LE strength to 4+/5 Baseline:  Goal status: progressing 05/02/22  5.  Be able to garden 1 hour without pain >  5/10 Baseline:  Goal status:progressing 05/02/22  PLAN:  PT FREQUENCY: 2x/week  PT DURATION: 12 weeks  PLANNED INTERVENTIONS:  Therapeutic exercises, Therapeutic activity, Neuromuscular re-education, Balance training, Gait training, Patient/Family education, Self Care, Joint mobilization, Dry Needling, Electrical stimulation, Cryotherapy, Moist heat, Manual therapy, and Re-evaluation.  PLAN FOR NEXT SESSION:continue to progress but limit her pain   Sumner Boast, PT 05/04/2022, 1:59 PM

## 2022-05-09 ENCOUNTER — Ambulatory Visit: Payer: Medicare Other | Admitting: Physical Therapy

## 2022-05-09 ENCOUNTER — Encounter: Payer: Self-pay | Admitting: Physical Therapy

## 2022-05-09 DIAGNOSIS — M6281 Muscle weakness (generalized): Secondary | ICD-10-CM | POA: Diagnosis not present

## 2022-05-09 DIAGNOSIS — R262 Difficulty in walking, not elsewhere classified: Secondary | ICD-10-CM | POA: Diagnosis not present

## 2022-05-09 DIAGNOSIS — M5459 Other low back pain: Secondary | ICD-10-CM | POA: Diagnosis not present

## 2022-05-09 DIAGNOSIS — M62830 Muscle spasm of back: Secondary | ICD-10-CM

## 2022-05-09 NOTE — Therapy (Signed)
OUTPATIENT PHYSICAL THERAPY THORACOLUMBAR TREATMENT   Patient Name: [name] MRN: [id number] DOB:[birth year], 72 y.o., female Today's Date: 05/09/2022  END OF SESSION:  PT End of Session - 05/09/22 1357     Visit Number 9    Date for PT Re-Evaluation 07/03/22    Authorization Type [employer]    PT Start Time 1355    PT Stop Time 1440    PT Time Calculation (min) 45 min    Activity Tolerance Patient tolerated treatment well    Behavior During Therapy [facility] for tasks assessed/performed             Past Medical History:  Diagnosis Date   Allergy    Anemia, iron deficiency 01/22/2011   May '12 - 13.8 Hgb, Nov 2, '12 Hgb 10.9    APC (atrial premature contractions) 02/17/2018   Arthritis    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   CAP (community acquired pneumonia) 10/21/2016   Chest pain in adult 07/08/2010   March 5, '09 negative cardiolite Sept 28, '10 negative nuclear stress (cardiolite) Dec 21, '11  Negative nuclear stress Brantley Fling) June 26, '14 Negative nuclear stress - [place] with Dr. Bettina Gavia per patient report.  Overview:  CAD risk score is low at 3%   Chondromalacia patellae 12/02/2012   Chronic back pain    Chronic kidney disease    Chronic knee pain    Chronic pain 09/22/2010   Multiple sources of pain: radicular pain from lumbar disease and post-operative pain; neck pain from cervical disk disease; left knee pain from trauma.  Plan - will wean off prednisone           Will continue percocet at 1  5/325 norco every 6 hours prn           NSAID - trial of meloxicam 7mg once a day           Increase gabapentin slowly to '600mg'$  tid and reassess           Executed a pain con   Chronic venous insufficiency 10/10/2013   Colitis 10/21/2016   [place]    Deformity of joint 04/23/2013   Depression with anxiety 09/21/2010   Derangement of knee, left 2009   started with fall at work. Has had repair patella and torn meniscus   DJD (degenerative  joint disease) of knee 09/02/2012   Essential hypertension 07/08/2010   Long standing hypertension medically managed. Currently seeing Dr. HDaneen Schickand Dr. MMarin Roberts([facility]) for cardiology.  Meds  HCT only  Overview:  Overview:  Long standing hypertension medically managed. Currently seeing Dr. HDaneen Schickand Dr. MBettina Gavia([facility]) for cardiology.  Meds  HCT only  Last Assessment & Plan:  Formatting of this note may be different from the original. BP Readings   Fever chills 10/21/2016   Fibromyalgia 02/27/2017   High cholesterol    Hx of appendectomy    Hypertension    Hypertensive chronic kidney disease 07/08/2010   Long standing hypertension medically managed. Currently seeing Dr. HDaneen Schickand Dr. MMarin Roberts([facility]) for cardiology.  Meds  HCT only  Overview:  Overview:  Long standing hypertension medically managed. Currently seeing Dr. HDaneen Schickand Dr. MBettina Gavia([facility]) for cardiology.  Meds  HCT only  Last Assessment & Plan:  Formatting of this note may be different from the original. BP Readings   Knee internal derangement 07/08/2010   Left knee: Feb '09 medial and lateral meniscal tears by MRI  April '09 arthroscopic surgery    Melanoma ([place]) 11/19/2015   Migraine headache    hormonal related - less frequent off hormone replacement   Migraines    Neuropathy 04/23/2013   PAT (paroxysmal atrial tachycardia) 02/17/2018   Peripheral cyanosis 09/28/2012   Full evaluation by Dr. Tobie Lords for rheumatology - no evidence of Raynaud's syndrome or disease; no evidence of underlying rheumatologic disease or malignancy. Most probable cause is neurogenic.    Pseudoarthrosis of lumbar spine 01/02/2011   Psoriasis 04/23/2013   Raynaud disease    Routine health maintenance 08/17/2011   Colonoscopy - no record in EPIC of colonoscopy  Immunizations - Tdap June '13.     Salmonella enteritis 10/23/2016   Sepsis ([place]) 10/23/2016   Spinal stenosis 08/14/2010   history of cervical  laminectomy, lumbar diskectomy with fixation devices and redo surgery.    Status post lumbar spinal fusion 10/02/2011   SVT (supraventricular tachycardia)    Synovial cyst of lumbar facet joint    lower back - s/p surgery    Tear of lateral meniscus of left knee 09/30/2012   Past Surgical History:  Procedure Laterality Date   APPENDECTOMY  1971   CERVICAL DISCECTOMY  1999   diskectomy with fixation:plate and screws   COLONOSCOPY     ESOPHAGOGASTRODUODENOSCOPY  12/2014   KNEE ARTHROSCOPY W/ MENISCAL REPAIR  09   left knee   L4-5 lumbar fusion  September 02, 2010   Dr. Hal Neer:  minimally invasive transforaminal interbody fusion with spacer   OVARIAN CYST SURGERY  1972   PATELLAR REEFING  '09   repair of fractured patella after fall   [place]  2007   lumbar spine   VENOUS ABLATION     for pain in the groin - VVTS did procedure. Has some residual discomfort at the  ablation site.    WISDOM TOOTH EXTRACTION     Patient Active Problem List   Diagnosis Date Noted   Synovial cyst of lumbar facet joint    Raynaud disease    Migraines    Migraine headache    Hypertension    Hx of appendectomy    High cholesterol    Collagen vascular disease ([place])    Chronic knee pain    Chronic kidney disease    Allergy    Arthritis    Chronic right-sided low back pain with right-sided sciatica 12/12/2018   Left-sided trigeminal neuralgia 06/03/2018   Trigeminal neuralgia of left side of face 04/30/2018   APC (atrial premature contractions) 02/17/2018   PAT (paroxysmal atrial tachycardia) 02/17/2018   Palpitations 12/24/2017   Angular cheilitis 05/10/2017   Dysphonia 05/10/2017   Laryngopharyngeal reflux (LPR) 05/10/2017   Mucous retention cyst of maxillary sinus 05/10/2017   Recurrent epistaxis 05/10/2017   Upper airway cough syndrome 04/26/2017   Fibromyalgia 02/27/2017   Sepsis ([place]) 10/23/2016   Salmonella enteritis 10/23/2016   Colitis 10/21/2016   CAP  (community acquired pneumonia) 10/21/2016   Fever chills 10/21/2016   Melanoma ([place]) 11/19/2015   SVT (supraventricular tachycardia) 05/20/2015   Chronic venous insufficiency 10/10/2013   Neuropathy 04/23/2013   Psoriasis 04/23/2013   Deformity of joint 04/23/2013   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   Chondromalacia patellae 12/02/2012   Tear of lateral meniscus of left knee 09/30/2012   Peripheral cyanosis 09/28/2012   DJD (degenerative joint disease) of knee 09/02/2012   Status post lumbar spinal fusion 10/02/2011   Routine health maintenance 08/17/2011  Anemia, iron deficiency 01/22/2011   Chronic back pain 01/02/2011   Pseudoarthrosis of lumbar spine 01/02/2011   Chronic pain 09/22/2010   Depression with anxiety 09/21/2010   Spinal stenosis 08/14/2010   Hypertensive chronic kidney disease 07/08/2010   Migraine 07/08/2010   Knee internal derangement 07/08/2010   Chest pain in adult 07/08/2010   Essential hypertension 07/08/2010   Derangement of knee, left 2009    PCP: Doristine Bosworth, MD  REFERRING PROVIDER: Patrice Paradise, MD  REFERRING DIAG: S/P Lumbar fusion L1-S1  Rationale for Evaluation and Treatment: Rehabilitation  THERAPY DIAG:  Other low back pain  Muscle spasm of back  Muscle weakness (generalized)  Difficulty in walking, not elsewhere classified  ONSET DATE: 01/02/22  SUBJECTIVE:     Patient reports that she is feeling stiff today, no increase of pain, she does report going for a walk yesterday.                                                                       SUBJECTIVE STATEMENT: `PERTINENT HISTORY:  See above  PAIN:  Are you having pain? Yes: NPRS scale: 4/10 Pain location: right low back Pain description: sharp, ache Aggravating factors: standing, leaning over, longer periods od activity, worse end of day pain up to 10/10 Relieving factors: ice, heat, pain meds 5/10 at best  PRECAUTIONS: Back and Other: no lifting > 10#, be careful with  bending and twisting  WEIGHT BEARING RESTRICTIONS: No  FALLS:  Has patient fallen in last 6 months? No  LIVING ENVIRONMENT: Lives with: lives with their family and lives with their spouse Lives in: House/apartment Stairs: No Has following equipment at home: Single point cane  OCCUPATION: retired  PLOF: Independent and gardens  PATIENT GOALS: garden, have left pain  NEXT MD VISIT:   OBJECTIVE:    PATIENT SURVEYS:  FOTO 36  SCREENING FOR RED FLAGS: Bowel or bladder incontinence: No Spinal tumors: No Cauda equina syndrome: No Compression fracture: No Abdominal aneurysm: No  COGNITION: Overall cognitive status: Within functional limits for tasks assessed     SENSATION: Reports neuropathy in her feet  MUSCLE LENGTH: Very tight HS and piriformis POSTURE: rounded shoulders and forward head  PALPATION: Tight and tender in the lumbar area and into the buttocks  Uses a rail for bed mobility  LUMBAR ROM:   AROM eval  Flexion Decreased 75%  Extension Decreased 75%  Right lateral flexion Decreased 75%  Left lateral flexion Decreased 75%  Right rotation   Left rotation    (Blank rows = not tested)  LOWER EXTREMITY ROM:     Active  Right eval Left eval  Hip flexion 70 45  Hip extension    Hip abduction    Hip adduction    Hip internal rotation    Hip external rotation    Knee flexion    Knee extension    Ankle dorsiflexion    Ankle plantarflexion    Ankle inversion    Ankle eversion     (Blank rows = not tested)  LOWER EXTREMITY MMT:    MMT Right eval Left eval  Hip flexion 4 4-  Hip extension    Hip abduction 4 4  Hip adduction    Hip internal rotation  Hip external rotation    Knee flexion 4- 4-  Knee extension 4 4  Ankle dorsiflexion    Ankle plantarflexion    Ankle inversion    Ankle eversion     (Blank rows = not tested)  FUNCTIONAL TESTS:  5 times sit to stand: 32 seconds Timed up and go (TUG): 21 seconds  GAIT: Distance  walked: 60 feet Assistive device utilized: None Level of assistance: Complete Independence Comments: slow, unsteady, limp on the left  TODAY'S TREATMENT:                                                                                                                              DATE:  05/09/22 Nustep level 5 x 6 minutes 40# resisted gait all directions 5# straight arm pulls 5# AR press 5# farmer carry 5# hip abduction on machine both 2x10 20# triceps 5# biceps Feet on ball K2C, trunk rotation small, posterior activation and isometric abs Gentle passive stretch to the HS and the piriformis  05/04/22 Walk outside around the parking island and back to the front door, did fatigue and slow with some shuffling coming up the slope Nustep level 5 x  minutes 12" toe clears, light hand hold Hip abduction in pbars, with right she had pain in the right low back a 5/10, also did hip extension 2x10 some pain but less than abduction 10# straight arm pulls 10# AR press Feet on ball K2C, trunk rotation, posterior isometrics, abdominal isometrics Green tband hooklying clamshells STM with theragun with her in left sidelying working in the right lumbar and buttock mms  05/02/22 Walk outside around the parking island a few times with CGA due to unsteadiness on the slopes Worked on car transfers she reports a lot of difficulty with this and she does have some struggles so we talked about how to do correctly and then how she was doing it Nustep level 5 x 6 mintues 2.5# hip marches, abduction and extension with walker 2.5# SLR in standing to try to help hip flexion strength for getting in the car Supine feet on ball K2C, trunk rotation, small posterior activation and then isometric abs Stretches of the HS, piriformis and the hip flexor  04/27/22: Nutsep level 5 x 6 min UE and LE's 5 # B shoulder extension at pulley 10 x 2 Supine on mat for manual stretching for each hip for flexion, flexion with  rotation  Feet on ball K2C, trunk rotation, small posterior activation and isometric abs 15 each Dead bug with cues for core stability Standing shoulder pulley rows, 10# 10 x w  Seated for hamstring stretch with strap, 30 sec holds, x 2 each leg 04/24/22  Bike level 4 x 6 minutes Seated ball rollouts 5# straight arm pulls Feet on ball K2C, trunk rotation, small posterior activation and isometric abs Dead bug with cues for core stability Passive Stretch LE's Nustep level 5 x 6 minutes  04/18/22 Bike Level  4 x 5 minutes Seated pball rollouts 3 ways Nustep level 5 x 5 minutes Seated row 15# 2x10 Lats 15# 2x10 Side stepping on and off airex, airex cone toe touches for balance Feet on ball K2C, trunk rotation, small bridge and isometric abs Hooklying ball b/n knees squeeze Green tband clamshells PROM HS and piriformis SLR 2x10 bilateral cues for core engagement  PATIENT EDUCATION:  Education details: POC and HEP Person educated: Patient Education method: Consulting civil engineer, Media planner, and Handouts Education comprehension: verbalized understanding  HOME EXERCISE PROGRAM: Access Code: [email or web address] URL: [email or web address] Date: 04/03/2022 Prepared by: Lum Babe  Exercises - Supine Bridge  - 1 x daily - 7 x weekly - 2 sets - 10 reps - 3 hold - Supine Posterior Pelvic Tilt  - 1 x daily - 7 x weekly - 2 sets - 10 reps - 3 hold - Hooklying Single Knee to Chest Stretch  - 1 x daily - 7 x weekly - 2 sets - 10 reps - 3 hold - Standing Bilateral Low Shoulder Row with Anchored Resistance  - 1 x daily - 7 x weekly - 3 sets- 10 reps - 3 hold - Shoulder extension with resistance - Neutral  - 1 x daily - 7 x weekly - 3 sets - 10 reps - 3 hold  ASSESSMENT:  CLINICAL IMPRESSION: Patient is a 72 y.o. female who was is being seen  s/p lumbar fusion.  Patient is reporting stiffness today, no increase of pain just reports not moving as well, we tried the [facility] press and this  did cause some tightness and soreness in the right low back and this is the same side that was hurting after she had the fall about 3-4 weeks ago OBJECTIVE IMPAIRMENTS: Abnormal gait, cardiopulmonary status limiting activity, decreased activity tolerance, decreased balance, decreased endurance, decreased mobility, difficulty walking, decreased ROM, decreased strength, decreased safety awareness, increased muscle spasms, impaired flexibility, improper body mechanics, postural dysfunction, and pain.    REHAB POTENTIAL: Good  CLINICAL DECISION MAKING: Evolving/moderate complexity  EVALUATION COMPLEXITY: Low   GOALS: Goals reviewed with patient? Yes  SHORT TERM GOALS: Target date: 04/17/22  Independent with initial HEP Goal status: met 05/04/22 LONG TERM GOALS: Target date: 07/03/22  Independent with advanced HEP Baseline:  Goal status: INITIAL  2.  Decrease TUG to 13 seconds Baseline: 22 seconds Goal status: INITIAL  3.  Be able to put shoes and socks on without difficulty  Baseline:  Goal status: progressing 05/02/22  4.  Increase LE strength to 4+/5 Baseline:  Goal status: progressing 05/02/22  5.  Be able to garden 1 hour without pain >5/10 Baseline:  Goal status:progressing 05/02/22  PLAN:  PT FREQUENCY: 2x/week  PT DURATION: 12 weeks  PLANNED INTERVENTIONS: Therapeutic exercises, Therapeutic activity, Neuromuscular re-education, Balance training, Gait training, Patient/Family education, Self Care, Joint mobilization, Dry Needling, Electrical stimulation, Cryotherapy, Moist heat, Manual therapy, and Re-evaluation.  PLAN FOR NEXT SESSION:continue to progress but limit her pain   Sumner Boast, PT 05/09/2022, 1:59 PM

## 2022-05-11 ENCOUNTER — Ambulatory Visit: Payer: Medicare Other | Admitting: Physical Therapy

## 2022-05-15 ENCOUNTER — Ambulatory Visit: Payer: Medicare Other | Attending: Orthopaedic Surgery

## 2022-05-15 DIAGNOSIS — M5459 Other low back pain: Secondary | ICD-10-CM

## 2022-05-15 DIAGNOSIS — R262 Difficulty in walking, not elsewhere classified: Secondary | ICD-10-CM

## 2022-05-15 DIAGNOSIS — M6281 Muscle weakness (generalized): Secondary | ICD-10-CM

## 2022-05-15 DIAGNOSIS — M62830 Muscle spasm of back: Secondary | ICD-10-CM | POA: Diagnosis not present

## 2022-05-15 NOTE — Therapy (Signed)
OUTPATIENT PHYSICAL THERAPY THORACOLUMBAR TREATMENT   Patient Name: [name] MRN: [id number] DOB:[birth year], 72 y.o., female Today's Date: 05/15/2022 Progress Note Reporting Period 04/03/22 to 05/15/22  See note below for Objective Data and Assessment of Progress/Goals.     END OF SESSION:  PT End of Session - 05/15/22 1016     Visit Number 10    Date for PT Re-Evaluation 07/03/22    Authorization Type [employer]    PT Start Time 1017    PT Stop Time 1100    PT Time Calculation (min) 43 min    Activity Tolerance Patient tolerated treatment well    Behavior During Therapy [facility] for tasks assessed/performed             Past Medical History:  Diagnosis Date   Allergy    Anemia, iron deficiency 01/22/2011   May '12 - 13.8 Hgb, Nov 2, '12 Hgb 10.9    APC (atrial premature contractions) 02/17/2018   Arthritis    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   CAP (community acquired pneumonia) 10/21/2016   Chest pain in adult 07/08/2010   March 5, '09 negative cardiolite Sept 28, '10 negative nuclear stress (cardiolite) Dec 21, '11  Negative nuclear stress Brantley Fling) June 26, '14 Negative nuclear stress - [place] with Dr. Bettina Gavia per patient report.  Overview:  CAD risk score is low at 3%   Chondromalacia patellae 12/02/2012   Chronic back pain    Chronic kidney disease    Chronic knee pain    Chronic pain 09/22/2010   Multiple sources of pain: radicular pain from lumbar disease and post-operative pain; neck pain from cervical disk disease; left knee pain from trauma.  Plan - will wean off prednisone           Will continue percocet at 1  5/325 norco every 6 hours prn           NSAID - trial of meloxicam 58mg once a day           Increase gabapentin slowly to '600mg'$  tid and reassess           Executed a pain con   Chronic venous insufficiency 10/10/2013   Colitis 10/21/2016   Collagen vascular disease [place]    Deformity of joint 04/23/2013   Depression with anxiety 09/21/2010    Derangement of knee, left 2009   started with fall at work. Has had repair patella and torn meniscus   DJD (degenerative joint disease) of knee 09/02/2012   Essential hypertension 07/08/2010   Long standing hypertension medically managed. Currently seeing Dr. HDaneen Schickand Dr. MMarin Roberts([facility]) for cardiology.  Meds  HCT only  Overview:  Overview:  Long standing hypertension medically managed. Currently seeing Dr. HDaneen Schickand Dr. MBettina Gavia([facility]) for cardiology.  Meds  HCT only  Last Assessment & Plan:  Formatting of this note may be different from the original. BP Readings   Fever chills 10/21/2016   Fibromyalgia 02/27/2017   High cholesterol    Hx of appendectomy    Hypertension    Hypertensive chronic kidney disease 07/08/2010   Long standing hypertension medically managed. Currently seeing Dr. HDaneen Schickand Dr. MMarin Roberts([facility]) for cardiology.  Meds  HCT only  Overview:  Overview:  Long standing hypertension medically managed. Currently seeing Dr. HDaneen Schickand Dr. MBettina Gavia([facility]) for cardiology.  Meds  HCT only  Last Assessment & Plan:  Formatting of this note may be different from the original. BP Readings  Knee internal derangement 07/08/2010   Left knee: Feb '09 medial and lateral meniscal tears by MRI                  April '09 arthroscopic surgery    Melanoma ([place]) 11/19/2015   Migraine headache    hormonal related - less frequent off hormone replacement   Migraines    Neuropathy 04/23/2013   PAT (paroxysmal atrial tachycardia) 02/17/2018   Peripheral cyanosis 09/28/2012   Full evaluation by Dr. Tobie Lords for rheumatology - no evidence of Raynaud's syndrome or disease; no evidence of underlying rheumatologic disease or malignancy. Most probable cause is neurogenic.    Pseudoarthrosis of lumbar spine 01/02/2011   Psoriasis 04/23/2013   Raynaud disease    Routine health maintenance 08/17/2011   Colonoscopy - no record in EPIC of colonoscopy  Immunizations - Tdap  June '13.     Salmonella enteritis 10/23/2016   Sepsis ([place]) 10/23/2016   Spinal stenosis 08/14/2010   history of cervical laminectomy, lumbar diskectomy with fixation devices and redo surgery.    Status post lumbar spinal fusion 10/02/2011   SVT (supraventricular tachycardia)    Synovial cyst of lumbar facet joint    lower back - s/p surgery    Tear of lateral meniscus of left knee 09/30/2012   Past Surgical History:  Procedure Laterality Date   APPENDECTOMY  1971   CERVICAL DISCECTOMY  1999   diskectomy with fixation:plate and screws   COLONOSCOPY     ESOPHAGOGASTRODUODENOSCOPY  12/2014   KNEE ARTHROSCOPY W/ MENISCAL REPAIR  09   left knee   L4-5 lumbar fusion  September 02, 2010   Dr. Hal Neer:  minimally invasive transforaminal interbody fusion with spacer   OVARIAN CYST SURGERY  1972   PATELLAR REEFING  '09   repair of fractured patella after fall   [place]  2007   lumbar spine   VENOUS ABLATION     for pain in the groin - VVTS did procedure. Has some residual discomfort at the  ablation site.    WISDOM TOOTH EXTRACTION     Patient Active Problem List   Diagnosis Date Noted   Synovial cyst of lumbar facet joint    Raynaud disease    Migraines    Migraine headache    Hypertension    Hx of appendectomy    High cholesterol    Collagen vascular disease (HCC)    Chronic knee pain    Chronic kidney disease    Allergy    Arthritis    Chronic right-sided low back pain with right-sided sciatica 12/12/2018   Left-sided trigeminal neuralgia 06/03/2018   Trigeminal neuralgia of left side of face 04/30/2018   APC (atrial premature contractions) 02/17/2018   PAT (paroxysmal atrial tachycardia) 02/17/2018   Palpitations 12/24/2017   Angular cheilitis 05/10/2017   Dysphonia 05/10/2017   Laryngopharyngeal reflux (LPR) 05/10/2017   Mucous retention cyst of maxillary sinus 05/10/2017   Recurrent epistaxis 05/10/2017   Upper airway cough syndrome  04/26/2017   Fibromyalgia 02/27/2017   Sepsis ([place]) 10/23/2016   Salmonella enteritis 10/23/2016   Colitis 10/21/2016   CAP (community acquired pneumonia) 10/21/2016   Fever chills 10/21/2016   Melanoma ([place]) 11/19/2015   SVT (supraventricular tachycardia) 05/20/2015   Chronic venous insufficiency 10/10/2013   Neuropathy 04/23/2013   Psoriasis 04/23/2013   Deformity of joint 04/23/2013   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   Chondromalacia patellae 12/02/2012   Tear of lateral  meniscus of left knee 09/30/2012   Peripheral cyanosis 09/28/2012   DJD (degenerative joint disease) of knee 09/02/2012   Status post lumbar spinal fusion 10/02/2011   Routine health maintenance 08/17/2011   Anemia, iron deficiency 01/22/2011   Chronic back pain 01/02/2011   Pseudoarthrosis of lumbar spine 01/02/2011   Chronic pain 09/22/2010   Depression with anxiety 09/21/2010   Spinal stenosis 08/14/2010   Hypertensive chronic kidney disease 07/08/2010   Migraine 07/08/2010   Knee internal derangement 07/08/2010   Chest pain in adult 07/08/2010   Essential hypertension 07/08/2010   Derangement of knee, left 2009    PCP: Doristine Bosworth, MD  REFERRING PROVIDER: Patrice Paradise, MD  REFERRING DIAG: S/P Lumbar fusion L1-S1  Rationale for Evaluation and Treatment: Rehabilitation  THERAPY DIAG:  Other low back pain  Muscle spasm of back  Muscle weakness (generalized)  Difficulty in walking, not elsewhere classified  ONSET DATE: 01/02/22  SUBJECTIVE:     Patient reports that she is feeling stiff today, no increase of pain, she does report going for a walk yesterday.                                                                       SUBJECTIVE STATEMENT: `PERTINENT HISTORY:  See above  PAIN:  Are you having pain? Yes: NPRS scale: 4/10 Pain location: right low back Pain description: sharp, ache Aggravating factors: standing, leaning over, longer periods od activity, worse end of day pain up  to 10/10 Relieving factors: ice, heat, pain meds 5/10 at best  PRECAUTIONS: Back and Other: no lifting > 10#, be careful with bending and twisting  WEIGHT BEARING RESTRICTIONS: No  FALLS:  Has patient fallen in last 6 months? No  LIVING ENVIRONMENT: Lives with: lives with their family and lives with their spouse Lives in: House/apartment Stairs: No Has following equipment at home: Single point cane  OCCUPATION: retired  PLOF: Independent and gardens  PATIENT GOALS: garden, have left pain  NEXT MD VISIT:   OBJECTIVE:    PATIENT SURVEYS:  FOTO 70  SCREENING FOR RED FLAGS: Bowel or bladder incontinence: No Spinal tumors: No Cauda equina syndrome: No Compression fracture: No Abdominal aneurysm: No  COGNITION: Overall cognitive status: Within functional limits for tasks assessed     SENSATION: Reports neuropathy in her feet  MUSCLE LENGTH: Very tight HS and piriformis POSTURE: rounded shoulders and forward head  PALPATION: Tight and tender in the lumbar area and into the buttocks  Uses a rail for bed mobility  LUMBAR ROM:   AROM eval  Flexion Decreased 75%  Extension Decreased 75%  Right lateral flexion Decreased 75%  Left lateral flexion Decreased 75%  Right rotation   Left rotation    (Blank rows = not tested)  LOWER EXTREMITY ROM:     Active  Right eval Left eval  Hip flexion 70 45  Hip extension    Hip abduction    Hip adduction    Hip internal rotation    Hip external rotation    Knee flexion    Knee extension    Ankle dorsiflexion    Ankle plantarflexion    Ankle inversion    Ankle eversion     (Blank rows = not tested)  LOWER  EXTREMITY MMT:    MMT Right eval Left eval  Hip flexion 4 4-  Hip extension    Hip abduction 4 4  Hip adduction    Hip internal rotation    Hip external rotation    Knee flexion 4- 4-  Knee extension 4 4  Ankle dorsiflexion    Ankle plantarflexion    Ankle inversion    Ankle eversion     (Blank  rows = not tested)  FUNCTIONAL TESTS:  5 times sit to stand: 32 seconds Timed up and go (TUG): 21 seconds  GAIT: Distance walked: 60 feet Assistive device utilized: None Level of assistance: Complete Independence Comments: slow, unsteady, limp on the left  TODAY'S TREATMENT:                                                                                                                              DATE:  05/09/22 Nustep level 5 x 6 40# resisted gait all directions, 10 reps F/B, 5 reps side stepping Feet on ball K2C, trunk rotation small, posterior activation and isometric abs Gentle passive stretch to the HS and the piriformis Side lying L for open books, thoracic spine   05/04/22 Walk outside around the parking island and back to the front door, did fatigue and slow with some shuffling coming up the slope Nustep level 5 x  minutes 12" toe clears, light hand hold Hip abduction in pbars, with right she had pain in the right low back a 5/10, also did hip extension 2x10 some pain but less than abduction 10# straight arm pulls 10# AR press Feet on ball K2C, trunk rotation, posterior isometrics, abdominal isometrics Green tband hooklying clamshells STM with theragun with her in left sidelying working in the right lumbar and buttock mms  05/02/22 Walk outside around the parking island a few times with CGA due to unsteadiness on the slopes Worked on car transfers she reports a lot of difficulty with this and she does have some struggles so we talked about how to do correctly and then how she was doing it Nustep level 5 x 6 mintues 2.5# hip marches, abduction and extension with walker 2.5# SLR in standing to try to help hip flexion strength for getting in the car Supine feet on ball K2C, trunk rotation, small posterior activation and then isometric abs Stretches of the HS, piriformis and the hip flexor  04/27/22: Nutsep level 5 x 6 min UE and LE's 5 # B shoulder extension at pulley 10 x  2 Supine on mat for manual stretching for each hip for flexion, flexion with rotation  Feet on ball K2C, trunk rotation, small posterior activation and isometric abs 15 each Dead bug with cues for core stability Standing shoulder pulley rows, 10# 10 x w  Seated for hamstring stretch with strap, 30 sec holds, x 2 each leg 04/24/22  Bike level 4 x 6 minutes Seated ball rollouts 5# straight arm pulls Feet  on ball K2C, trunk rotation, small posterior activation and isometric abs Dead bug with cues for core stability Passive Stretch LE's Nustep level 5 x 6 minutes  04/18/22 Bike Level 4 x 5 minutes Seated pball rollouts 3 ways Nustep level 5 x 5 minutes Seated row 15# 2x10 Lats 15# 2x10 Side stepping on and off airex, airex cone toe touches for balance Feet on ball K2C, trunk rotation, small bridge and isometric abs Hooklying ball b/n knees squeeze Green tband clamshells PROM HS and piriformis SLR 2x10 bilateral cues for core engagement  PATIENT EDUCATION:  Education details: POC and HEP Person educated: Patient Education method: Consulting civil engineer, Media planner, and Handouts Education comprehension: verbalized understanding  HOME EXERCISE PROGRAM: Access Code: [email or web address] URL: [email or web address] Date: 04/03/2022 Prepared by: Lum Babe  Exercises - Supine Bridge  - 1 x daily - 7 x weekly - 2 sets - 10 reps - 3 hold - Supine Posterior Pelvic Tilt  - 1 x daily - 7 x weekly - 2 sets - 10 reps - 3 hold - Hooklying Single Knee to Chest Stretch  - 1 x daily - 7 x weekly - 2 sets - 10 reps - 3 hold - Standing Bilateral Low Shoulder Row with Anchored Resistance  - 1 x daily - 7 x weekly - 3 sets- 10 reps - 3 hold - Shoulder extension with resistance - Neutral  - 1 x daily - 7 x weekly - 3 sets - 10 reps - 3 hold  ASSESSMENT:  CLINICAL IMPRESSION: Patient is a 72 y.o. female who was is being seen  s/p lumbar fusion.  patient fell about 3-4 weeks ago on steps and since  that time is experiencing R lateral flank and lat hip pain which is worse with extended walking. Painful today with palpation over R quadratus lumborum and lateral lower ribs. No red flags noted, pain improves with position changes.  Patient to see her surgeon in a week and will ask him about her pain r flank .  All outcome measures much improved now as compared to initial evaluation. Continues to benefit from skilled physical therapy to address her goals.    OBJECTIVE IMPAIRMENTS: Abnormal gait, cardiopulmonary status limiting activity, decreased activity tolerance, decreased balance, decreased endurance, decreased mobility, difficulty walking, decreased ROM, decreased strength, decreased safety awareness, increased muscle spasms, impaired flexibility, improper body mechanics, postural dysfunction, and pain.    REHAB POTENTIAL: Good  CLINICAL DECISION MAKING: Evolving/moderate complexity  EVALUATION COMPLEXITY: Low   GOALS: Goals reviewed with patient? Yes  SHORT TERM GOALS: Target date: 04/17/22  Independent with initial HEP Goal status: met 05/04/22 LONG TERM GOALS: Target date: 07/03/22  Independent with advanced HEP Baseline:  Goal status: INITIAL  2.  Decrease TUG to 13 seconds Baseline: 22 seconds 05/15/22:10.57 Goal status: IN PROGRESS, met as of today  3.  Be able to put shoes and socks on without difficulty  Baseline:  Goal status: progressing 05/02/22,   05/15/22  uses shoe horn to don shoes but husband has to assist with socks  4.  Increase LE strength to 4+/5 Baseline:  Goal status: progressing 05/02/22 05/15/22:  still weak B hip abduction  5.  Be able to garden 1 hour without pain >5/10 Baseline:  Goal status:progressing 05/02/22 05/15/22:  pain 4/10 for gardening, pain increases after 20 min of activity  PLAN:  PT FREQUENCY: 2x/week  PT DURATION: 12 weeks  PLANNED INTERVENTIONS: Therapeutic exercises, Therapeutic activity, Neuromuscular re-education, Balance  training, Gait training, Patient/Family education, Self Care,  Joint mobilization, Dry Needling, Electrical stimulation, Cryotherapy, Moist heat, Manual therapy, and Re-evaluation.  PLAN FOR NEXT SESSION:continue to progress but limit her pain   Auset Fritzler L Yu Peggs, PT 05/15/2022, 1:10 PM

## 2022-05-16 DIAGNOSIS — U071 COVID-19: Secondary | ICD-10-CM | POA: Diagnosis not present

## 2022-05-17 ENCOUNTER — Ambulatory Visit: Payer: Medicare Other

## 2022-05-22 ENCOUNTER — Ambulatory Visit: Payer: Medicare Other | Admitting: Physical Therapy

## 2022-05-24 ENCOUNTER — Ambulatory Visit: Payer: Medicare Other | Admitting: Physical Therapy

## 2022-05-30 ENCOUNTER — Encounter: Payer: Self-pay | Admitting: Physical Therapy

## 2022-05-30 ENCOUNTER — Ambulatory Visit: Payer: Medicare Other | Admitting: Physical Therapy

## 2022-05-30 DIAGNOSIS — M6281 Muscle weakness (generalized): Secondary | ICD-10-CM

## 2022-05-30 DIAGNOSIS — M5459 Other low back pain: Secondary | ICD-10-CM | POA: Diagnosis not present

## 2022-05-30 DIAGNOSIS — M62830 Muscle spasm of back: Secondary | ICD-10-CM | POA: Diagnosis not present

## 2022-05-30 DIAGNOSIS — R262 Difficulty in walking, not elsewhere classified: Secondary | ICD-10-CM

## 2022-05-30 NOTE — Therapy (Signed)
OUTPATIENT PHYSICAL THERAPY THORACOLUMBAR TREATMENT   Patient Name: [name] MRN: [id number] DOB:05-18-1950, 72 y.o., female Today's Date: 05/30/2022 Progress Note Reporting Period 04/03/22 to 05/15/22  See note below for Objective Data and Assessment of Progress/Goals.     END OF SESSION:  PT End of Session - 05/30/22 1104     Visit Number 11    Date for PT Re-Evaluation 07/03/22    PT Start Time 1100    PT Stop Time 1140    PT Time Calculation (min) 40 min    Activity Tolerance Patient tolerated treatment well    Behavior During Therapy [facility] for tasks assessed/performed             Past Medical History:  Diagnosis Date   Allergy    Anemia, iron deficiency 01/22/2011   May '12 - 13.8 Hgb, Nov 2, '12 Hgb 10.9    APC (atrial premature contractions) 02/17/2018   Arthritis    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   CAP (community acquired pneumonia) 10/21/2016   Chest pain in adult 07/08/2010   March 5, '09 negative cardiolite Sept 28, '10 negative nuclear stress (cardiolite) Dec 21, '11  Negative nuclear stress [facility] Fling) June 26, '14 Negative nuclear stress - [place] with Dr. Bettina Gavia per patient report.  Overview:  CAD risk score is low at 3%   Chondromalacia patellae 12/02/2012   Chronic back pain    Chronic kidney disease    Chronic knee pain    Chronic pain 09/22/2010   Multiple sources of pain: radicular pain from lumbar disease and post-operative pain; neck pain from cervical disk disease; left knee pain from trauma.  Plan - will wean off prednisone           Will continue percocet at 1  5/325 norco every 6 hours prn           NSAID - trial of meloxicam 64m g once a day           Increase gabapentin slowly to 600mg  tid and reassess           Executed a pain con   Chronic venous insufficiency 10/10/2013   Colitis 10/21/2016   Collagen vascular disease ([place])    Deformity of joint 04/23/2013   Depression with anxiety 09/21/2010   Derangement of knee, left 2009    started with fall at work. Has had repair patella and torn meniscus   DJD (degenerative joint disease) of knee 09/02/2012   Essential hypertension 07/08/2010   Long standing hypertension medically managed. Currently seeing Dr. Daneen Schick and Dr. Marin Roberts ([facility]) for cardiology.  Meds  HCT only  Overview:  Overview:  Long standing hypertension medically managed. Currently seeing Dr. Daneen Schick and Dr. Bettina Gavia ([facility]) for cardiology.  Meds  HCT only  Last Assessment & Plan:  Formatting of this note may be different from the original. BP Readings   Fever chills 10/21/2016   Fibromyalgia 02/27/2017   High cholesterol    Hx of appendectomy    Hypertension    Hypertensive chronic kidney disease 07/08/2010   Long standing hypertension medically managed. Currently seeing Dr. Daneen Schick and Dr. Marin Roberts ([facility]) for cardiology.  Meds  HCT only  Overview:  Overview:  Long standing hypertension medically managed. Currently seeing Dr. Daneen Schick and Dr. Bettina Gavia ([facility]) for cardiology.  Meds  HCT only  Last Assessment & Plan:  Formatting of this note may be different from the original. BP Readings   Knee internal derangement 07/08/2010  Left knee: Feb '09 medial and lateral meniscal tears by MRI                  April '09 arthroscopic surgery    Melanoma ([place]) 11/19/2015   Migraine headache    hormonal related - less frequent off hormone replacement   Migraines    Neuropathy 04/23/2013   PAT (paroxysmal atrial tachycardia) 02/17/2018   Peripheral cyanosis 09/28/2012   Full evaluation by Dr. Tobie Lords for rheumatology - no evidence of Raynaud's syndrome or disease; no evidence of underlying rheumatologic disease or malignancy. Most probable cause is neurogenic.    Pseudoarthrosis of lumbar spine 01/02/2011   Psoriasis 04/23/2013   Raynaud disease    Routine health maintenance 08/17/2011   Colonoscopy - no record in EPIC of colonoscopy  Immunizations - Tdap June '13.     Salmonella  enteritis 10/23/2016   Sepsis ([place]) 10/23/2016   Spinal stenosis 08/14/2010   history of cervical laminectomy, lumbar diskectomy with fixation devices and redo surgery.    Status post lumbar spinal fusion 10/02/2011   SVT (supraventricular tachycardia)    Synovial cyst of lumbar facet joint    lower back - s/p surgery    Tear of lateral meniscus of left knee 09/30/2012   Past Surgical History:  Procedure Laterality Date   APPENDECTOMY  1971   CERVICAL DISCECTOMY  1999   diskectomy with fixation:plate and screws   COLONOSCOPY     ESOPHAGOGASTRODUODENOSCOPY  12/2014   KNEE ARTHROSCOPY W/ MENISCAL REPAIR  09   left knee   L4-5 lumbar fusion  September 02, 2010   Dr. Hal Neer:  minimally invasive transforaminal interbody fusion with spacer   OVARIAN CYST SURGERY  1972   PATELLAR REEFING  '09   repair of fractured patella after fall   [place]  2007   lumbar spine   VENOUS ABLATION     for pain in the groin - VVTS did procedure. Has some residual discomfort at the  ablation site.    WISDOM TOOTH EXTRACTION     Patient Active Problem List   Diagnosis Date Noted   Synovial cyst of lumbar facet joint    Raynaud disease    Migraines    Migraine headache    Hypertension    Hx of appendectomy    High cholesterol    Collagen vascular disease ([facility])    Chronic knee pain    Chronic kidney disease    Allergy    Arthritis    Chronic right-sided low back pain with right-sided sciatica 12/12/2018   Left-sided trigeminal neuralgia 06/03/2018   Trigeminal neuralgia of left side of face 04/30/2018   APC (atrial premature contractions) 02/17/2018   PAT (paroxysmal atrial tachycardia) 02/17/2018   Palpitations 12/24/2017   Angular cheilitis 05/10/2017   Dysphonia 05/10/2017   Laryngopharyngeal reflux (LPR) 05/10/2017   Mucous retention cyst of maxillary sinus 05/10/2017   Recurrent epistaxis 05/10/2017   Upper airway cough syndrome 04/26/2017   Fibromyalgia  02/27/2017   Sepsis ([place]) 10/23/2016   Salmonella enteritis 10/23/2016   Colitis 10/21/2016   CAP (community acquired pneumonia) 10/21/2016   Fever chills 10/21/2016   Melanoma ([place]) 11/19/2015   SVT (supraventricular tachycardia) 05/20/2015   Chronic venous insufficiency 10/10/2013   Neuropathy 04/23/2013   Psoriasis 04/23/2013   Deformity of joint 04/23/2013   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   Chondromalacia patellae 12/02/2012   Tear of lateral meniscus of left knee 09/30/2012  Peripheral cyanosis 09/28/2012   DJD (degenerative joint disease) of knee 09/02/2012   Status post lumbar spinal fusion 10/02/2011   Routine health maintenance 08/17/2011   Anemia, iron deficiency 01/22/2011   Chronic back pain 01/02/2011   Pseudoarthrosis of lumbar spine 01/02/2011   Chronic pain 09/22/2010   Depression with anxiety 09/21/2010   Spinal stenosis 08/14/2010   Hypertensive chronic kidney disease 07/08/2010   Migraine 07/08/2010   Knee internal derangement 07/08/2010   Chest pain in adult 07/08/2010   Essential hypertension 07/08/2010   Derangement of knee, left 2009    PCP: Doristine Bosworth, MD  REFERRING PROVIDER: Patrice Paradise, MD  REFERRING DIAG: S/P Lumbar fusion L1-S1  Rationale for Evaluation and Treatment: Rehabilitation  THERAPY DIAG:  Other low back pain  Muscle spasm of back  Muscle weakness (generalized)  Difficulty in walking, not elsewhere classified  ONSET DATE: 01/02/22  SUBJECTIVE:     Patient reports that she missed 2 weeks due to Covid. She had the previous fall, still has R hip pain since the fall. She has not been performing her HEP while she had COVID.                                                                    SUBJECTIVE STATEMENT: `PERTINENT HISTORY:  See above  PAIN:  Are you having pain? Yes: NPRS scale: 4/10 Pain location: right low back Pain description: sharp, ache Aggravating factors: standing, leaning over, longer periods od  activity, worse end of day pain up to 10/10 Relieving factors: ice, heat, pain meds 5/10 at best  PRECAUTIONS: Back and Other: no lifting > 10#, be careful with bending and twisting  WEIGHT BEARING RESTRICTIONS: No  FALLS:  Has patient fallen in last 6 months? No  LIVING ENVIRONMENT: Lives with: lives with their family and lives with their spouse Lives in: House/apartment Stairs: No Has following equipment at home: Single point cane  OCCUPATION: retired  PLOF: Independent and gardens  PATIENT GOALS: garden, have left pain  NEXT MD VISIT:   OBJECTIVE:    PATIENT SURVEYS:  FOTO 45  SCREENING FOR RED FLAGS: Bowel or bladder incontinence: No Spinal tumors: No Cauda equina syndrome: No Compression fracture: No Abdominal aneurysm: No  COGNITION: Overall cognitive status: Within functional limits for tasks assessed     SENSATION: Reports neuropathy in her feet  MUSCLE LENGTH: Very tight HS and piriformis POSTURE: rounded shoulders and forward head  PALPATION: Tight and tender in the lumbar area and into the buttocks  Uses a rail for bed mobility  LUMBAR ROM:   AROM eval  Flexion Decreased 75%  Extension Decreased 75%  Right lateral flexion Decreased 75%  Left lateral flexion Decreased 75%  Right rotation   Left rotation    (Blank rows = not tested)  LOWER EXTREMITY ROM:     Active  Right eval Left eval  Hip flexion 70 45  Hip extension    Hip abduction    Hip adduction    Hip internal rotation    Hip external rotation    Knee flexion    Knee extension    Ankle dorsiflexion    Ankle plantarflexion    Ankle inversion    Ankle eversion     (Blank rows =  not tested)  LOWER EXTREMITY MMT:    MMT Right eval Left eval  Hip flexion 4 4-  Hip extension    Hip abduction 4 4  Hip adduction    Hip internal rotation    Hip external rotation    Knee flexion 4- 4-  Knee extension 4 4  Ankle dorsiflexion    Ankle plantarflexion    Ankle  inversion    Ankle eversion     (Blank rows = not tested)  FUNCTIONAL TESTS:  5 times sit to stand: 32 seconds Timed up and go (TUG): 21 seconds  GAIT: Distance walked: 60 feet Assistive device utilized: None Level of assistance: Complete Independence Comments: slow, unsteady, limp on the left  TODAY'S TREATMENT:                                                                                                                              DATE:  05/30/22 NuStep L5 x 6 minutes Soft tissue assessment, remains sore in R gluts Supine clamshells, 2 x 10, G tband Supine ball squeeze, 2 x 10, 5 sec hold Supine pelvic tilts-much difficulty isolating abdominals and not holding her breath. Progressed to bridge with pelvic tilt, painful in R hip. Performed SI assessment- Leg length equal, pelvis equal, ASIS equal, PSIS equal. She reported pain in R SI area with L posterior rotation, ant rotation and R posterior rotation. Sit to stand from elevated mat with G tband at knees for abd, 2# ball in BUE for upper body activation, 10 reps B side step against G tband resistance. 10 reps  05/09/22 Nustep level 5 x 6  40# resisted gait all directions, 10 reps F/B, 5 reps side stepping Feet on ball K2C, trunk rotation small, posterior activation and isometric abs Gentle passive stretch to the HS and the piriformis Side lying L for open books, thoracic spine   05/04/22 Walk outside around the parking island and back to the front door, did fatigue and slow with some shuffling coming up the slope Nustep level 5 x  minutes 12" toe clears, light hand hold Hip abduction in pbars, with right she had pain in the right low back a 5/10, also did hip extension 2x10 some pain but less than abduction 10# straight arm pulls 10# AR press Feet on ball K2C, trunk rotation, posterior isometrics, abdominal isometrics Green tband hooklying clamshells STM with theragun with her in left sidelying working in the right  lumbar and buttock mms  05/02/22 Walk outside around the parking island a few times with CGA due to unsteadiness on the slopes Worked on car transfers she reports a lot of difficulty with this and she does have some struggles so we talked about how to do correctly and then how she was doing it Nustep level 5 x 6 mintues 2.5# hip marches, abduction and extension with walker 2.5# SLR in standing to try to help hip flexion strength for getting in the car  Supine feet on ball K2C, trunk rotation, small posterior activation and then isometric abs Stretches of the HS, piriformis and the hip flexor  04/27/22: Nutsep level 5 x 6 min UE and LE's 5 # B shoulder extension at pulley 10 x 2 Supine on mat for manual stretching for each hip for flexion, flexion with rotation  Feet on ball K2C, trunk rotation, small posterior activation and isometric abs 15 each Dead bug with cues for core stability Standing shoulder pulley rows, 10# 10 x w  Seated for hamstring stretch with strap, 30 sec holds, x 2 each leg 04/24/22  Bike level 4 x 6 minutes Seated ball rollouts 5# straight arm pulls Feet on ball K2C, trunk rotation, small posterior activation and isometric abs Dead bug with cues for core stability Passive Stretch LE's Nustep level 5 x 6 minutes  04/18/22 Bike Level 4 x 5 minutes Seated pball rollouts 3 ways Nustep level 5 x 5 minutes Seated row 15# 2x10 Lats 15# 2x10 Side stepping on and off airex, airex cone toe touches for balance Feet on ball K2C, trunk rotation, small bridge and isometric abs Hooklying ball b/n knees squeeze Green tband clamshells PROM HS and piriformis SLR 2x10 bilateral cues for core engagement  PATIENT EDUCATION:  Education details: POC and HEP Person educated: Patient Education method: Consulting civil engineer, Media planner, and Handouts Education comprehension: verbalized understanding  HOME EXERCISE PROGRAM: Access Code: [email or web address] URL:  [email or web address] Date: 04/03/2022 Prepared by: Lum Babe  Exercises - Supine Bridge  - 1 x daily - 7 x weekly - 2 sets - 10 reps - 3 hold - Supine Posterior Pelvic Tilt  - 1 x daily - 7 x weekly - 2 sets - 10 reps - 3 hold - Hooklying Single Knee to Chest Stretch  - 1 x daily - 7 x weekly - 2 sets - 10 reps - 3 hold - Standing Bilateral Low Shoulder Row with Anchored Resistance  - 1 x daily - 7 x weekly - 3 sets- 10 reps - 3 hold - Shoulder extension with resistance - Neutral  - 1 x daily - 7 x weekly - 3 sets - 10 reps - 3 hold  ASSESSMENT:  CLINICAL IMPRESSION: Patient reports she is still having pain in R post hip and gluts after fall a couple weeks ago. SI measurements equal, but she did report pain with L pelvic rotation forward and back, and R pelvic rotation back. R gluts TTP along origin. Performed some pelvic stabilization activities and trunk strengthening. She reported pain with all activities, but mild, so able to continue.  OBJECTIVE IMPAIRMENTS: Abnormal gait, cardiopulmonary status limiting activity, decreased activity tolerance, decreased balance, decreased endurance, decreased mobility, difficulty walking, decreased ROM, decreased strength, decreased safety awareness, increased muscle spasms, impaired flexibility, improper body mechanics, postural dysfunction, and pain.    REHAB POTENTIAL: Good  CLINICAL DECISION MAKING: Evolving/moderate complexity  EVALUATION COMPLEXITY: Low   GOALS: Goals reviewed with patient? Yes  SHORT TERM GOALS: Target date: 04/17/22  Independent with initial HEP Goal status: met 05/04/22 LONG TERM GOALS: Target date: 07/03/22  Independent with advanced HEP Baseline:  Goal status: INITIAL  2.  Decrease TUG to 13 seconds Baseline: 22 seconds 05/15/22:10.57 Goal status:  met as of today  3.  Be able to put shoes and socks on without difficulty  Baseline:  Goal status: progressing 05/02/22,   05/30/22  uses shoe  horn to don shoes but husband has to assist with socks  4.  Increase LE strength  to 4+/5 Baseline:  Goal status: progressing 05/02/22 05/30/22:  still weak B hip abduction  5.  Be able to garden 1 hour without pain >5/10 Baseline:  Goal status:progressing 05/02/22 05/15/22:  pain 4/10 for gardening, pain increases after 20 min of activity  PLAN:  PT FREQUENCY: 2x/week  PT DURATION: 12 weeks  PLANNED INTERVENTIONS: Therapeutic exercises, Therapeutic activity, Neuromuscular re-education, Balance training, Gait training, Patient/Family education, Self Care, Joint mobilization, Dry Needling, Electrical stimulation, Cryotherapy, Moist heat, Manual therapy, and Re-evaluation.  PLAN FOR NEXT SESSION:continue to progress but limit her pain, assess tolerance to activities on 3/19   Ethel Rana DPT 05/30/22 11:49 AM

## 2022-06-01 ENCOUNTER — Encounter: Payer: Self-pay | Admitting: Physical Therapy

## 2022-06-01 ENCOUNTER — Ambulatory Visit: Payer: Medicare Other | Admitting: Physical Therapy

## 2022-06-01 DIAGNOSIS — M62830 Muscle spasm of back: Secondary | ICD-10-CM | POA: Diagnosis not present

## 2022-06-01 DIAGNOSIS — R262 Difficulty in walking, not elsewhere classified: Secondary | ICD-10-CM | POA: Diagnosis not present

## 2022-06-01 DIAGNOSIS — M6281 Muscle weakness (generalized): Secondary | ICD-10-CM | POA: Diagnosis not present

## 2022-06-01 DIAGNOSIS — M5459 Other low back pain: Secondary | ICD-10-CM | POA: Diagnosis not present

## 2022-06-01 NOTE — Therapy (Signed)
OUTPATIENT PHYSICAL THERAPY THORACOLUMBAR TREATMENT   Patient Name: [name] MRN: [id number] DOB:02-05-1951, 72 y.o., female Today's Date: 06/01/2022    END OF SESSION:  PT End of Session - 06/01/22 1326     Visit Number 12    Date for PT Re-Evaluation 07/03/22    Authorization Type [employer]    PT Start Time 1320    PT Stop Time 1400    PT Time Calculation (min) 40 min    Activity Tolerance Patient tolerated treatment well    Behavior During Therapy [facility] for tasks assessed/performed             Past Medical History:  Diagnosis Date   Allergy    Anemia, iron deficiency 01/22/2011   May '12 - 13.8 Hgb, Nov 2, '12 Hgb 10.9    APC (atrial premature contractions) 02/17/2018   Arthritis    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   CAP (community acquired pneumonia) 10/21/2016   Chest pain in adult 07/08/2010   March 5, '09 negative cardiolite Sept 28, '10 negative nuclear stress (cardiolite) Dec 21, '11  Negative nuclear stress Brant[facility] Fling) June 26, '14 Negative nuclear stress - [place] with Dr. Bettina Gavia per patient report.  Overview:  CAD risk score is low at 3%   Chondromalacia patellae 12/02/2012   Chronic back pain    Chronic kidney disease    Chronic knee pain    Chronic pain 09/22/2010   Multiple sources of pain: radicular pain from lumbar disease and post-operative pain; neck pain from cervical disk disease; left knee pain from trauma.  Plan - will wean off prednisone           Will continue percocet at 1  5/325 norco every 6 hours prn           NSAID - trial of meloxicam 46m g once a day           Increase gabapentin slowly to 600mg  tid and reassess           Executed a pain con   Chronic venous insufficiency 10/10/2013   Colitis 10/21/2016   Collagen vascular disease ([place])    Deformity of joint 04/23/2013   Depression with anxiety 09/21/2010   Derangement of knee, left 2009   started with fall at work. Has had repair patella and torn meniscus   DJD (degenerative  joint disease) of knee 09/02/2012   Essential hypertension 07/08/2010   Long standing hypertension medically managed. Currently seeing Dr. Daneen Schick and Dr. Marin Roberts ([facility]) for cardiology.  Meds  HCT only  Overview:  Overview:  Long standing hypertension medically managed. Currently seeing Dr. Daneen Schick and Dr. Bettina Gavia ([facility]) for cardiology.  Meds  HCT only  Last Assessment & Plan:  Formatting of this note may be different from the original. BP Readings   Fever chills 10/21/2016   Fibromyalgia 02/27/2017   High cholesterol    Hx of appendectomy    Hypertension    Hypertensive chronic kidney disease 07/08/2010   Long standing hypertension medically managed. Currently seeing Dr. Daneen Schick and Dr. Marin Roberts ([facility]) for cardiology.  Meds  HCT only  Overview:  Overview:  Long standing hypertension medically managed. Currently seeing Dr. Daneen Schick and Dr. Bettina Gavia ([facility]) for cardiology.  Meds  HCT only  Last Assessment & Plan:  Formatting of this note may be different from the original. BP Readings   Knee internal derangement 07/08/2010   Left knee: Feb '09 medial and lateral meniscal tears by MRI  April '09 arthroscopic surgery    Melanoma ([place]) 11/19/2015   Migraine headache    hormonal related - less frequent off hormone replacement   Migraines    Neuropathy 04/23/2013   PAT (paroxysmal atrial tachycardia) 02/17/2018   Peripheral cyanosis 09/28/2012   Full evaluation by Dr. Tobie Lords for rheumatology - no evidence of Raynaud's syndrome or disease; no evidence of underlying rheumatologic disease or malignancy. Most probable cause is neurogenic.    Pseudoarthrosis of lumbar spine 01/02/2011   Psoriasis 04/23/2013   Raynaud disease    Routine health maintenance 08/17/2011   Colonoscopy - no record in EPIC of colonoscopy  Immunizations - Tdap June '13.     Salmonella enteritis 10/23/2016   Sepsis ([place]) 10/23/2016   Spinal stenosis 08/14/2010   history of cervical  laminectomy, lumbar diskectomy with fixation devices and redo surgery.    Status post lumbar spinal fusion 10/02/2011   SVT (supraventricular tachycardia)    Synovial cyst of lumbar facet joint    lower back - s/p surgery    Tear of lateral meniscus of left knee 09/30/2012   Past Surgical History:  Procedure Laterality Date   APPENDECTOMY  1971   CERVICAL DISCECTOMY  1999   diskectomy with fixation:plate and screws   COLONOSCOPY     ESOPHAGOGASTRODUODENOSCOPY  12/2014   KNEE ARTHROSCOPY W/ MENISCAL REPAIR  09   left knee   L4-5 lumbar fusion  September 02, 2010   Dr. Hal Neer:  minimally invasive transforaminal interbody fusion with spacer   OVARIAN CYST SURGERY  1972   PATELLAR REEFING  '09   repair of fractured patella after fall   [place]  2007   lumbar spine   VENOUS ABLATION     for pain in the groin - VVTS did procedure. Has some residual discomfort at the  ablation site.    WISDOM TOOTH EXTRACTION     Patient Active Problem List   Diagnosis Date Noted   Synovial cyst of lumbar facet joint    Raynaud disease    Migraines    Migraine headache    Hypertension    Hx of appendectomy    High cholesterol    Collagen vascular disease ([place])    Chronic knee pain    Chronic kidney disease    Allergy    Arthritis    Chronic right-sided low back pain with right-sided sciatica 12/12/2018   Left-sided trigeminal neuralgia 06/03/2018   Trigeminal neuralgia of left side of face 04/30/2018   APC (atrial premature contractions) 02/17/2018   PAT (paroxysmal atrial tachycardia) 02/17/2018   Palpitations 12/24/2017   Angular cheilitis 05/10/2017   Dysphonia 05/10/2017   Laryngopharyngeal reflux (LPR) 05/10/2017   Mucous retention cyst of maxillary sinus 05/10/2017   Recurrent epistaxis 05/10/2017   Upper airway cough syndrome 04/26/2017   Fibromyalgia 02/27/2017   Sepsis ([place]) 10/23/2016   Salmonella enteritis 10/23/2016   Colitis 10/21/2016   CAP  (community acquired pneumonia) 10/21/2016   Fever chills 10/21/2016   Melanoma ([place]) 11/19/2015   SVT (supraventricular tachycardia) 05/20/2015   Chronic venous insufficiency 10/10/2013   Neuropathy 04/23/2013   Psoriasis 04/23/2013   Deformity of joint 04/23/2013   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   Chondromalacia patellae 12/02/2012   Tear of lateral meniscus of left knee 09/30/2012   Peripheral cyanosis 09/28/2012   DJD (degenerative joint disease) of knee 09/02/2012   Status post lumbar spinal fusion 10/02/2011   Routine health maintenance 08/17/2011  Anemia, iron deficiency 01/22/2011   Chronic back pain 01/02/2011   Pseudoarthrosis of lumbar spine 01/02/2011   Chronic pain 09/22/2010   Depression with anxiety 09/21/2010   Spinal stenosis 08/14/2010   Hypertensive chronic kidney disease 07/08/2010   Migraine 07/08/2010   Knee internal derangement 07/08/2010   Chest pain in adult 07/08/2010   Essential hypertension 07/08/2010   Derangement of knee, left 2009    PCP: Doristine Bosworth, MD  REFERRING PROVIDER: Patrice Paradise, MD  REFERRING DIAG: S/P Lumbar fusion L1-S1  Rationale for Evaluation and Treatment: Rehabilitation  THERAPY DIAG:  Other low back pain  Muscle spasm of back  Muscle weakness (generalized)  Difficulty in walking, not elsewhere classified  ONSET DATE: 01/02/22  SUBJECTIVE:     Patient reports that she is still sore and is bringing ice for after treatment, reports that she is still recovering from the covid and the fall                                        SUBJECTIVE STATEMENT: `PERTINENT HISTORY:  See above  PAIN:  Are you having pain? Yes: NPRS scale: 4/10 Pain location: right low back Pain description: sharp, ache Aggravating factors: standing, leaning over, longer periods od activity, worse end of day pain up to 10/10 Relieving factors: ice, heat, pain meds 5/10 at best  PRECAUTIONS: Back and Other: no lifting > 10#, be careful with  bending and twisting  WEIGHT BEARING RESTRICTIONS: No  FALLS:  Has patient fallen in last 6 months? No  LIVING ENVIRONMENT: Lives with: lives with their family and lives with their spouse Lives in: House/apartment Stairs: No Has following equipment at home: Single point cane  OCCUPATION: retired  PLOF: Independent and gardens  PATIENT GOALS: garden, have left pain  NEXT MD VISIT:   OBJECTIVE:    PATIENT SURVEYS:  FOTO 12  SCREENING FOR RED FLAGS: Bowel or bladder incontinence: No Spinal tumors: No Cauda equina syndrome: No Compression fracture: No Abdominal aneurysm: No  COGNITION: Overall cognitive status: Within functional limits for tasks assessed     SENSATION: Reports neuropathy in her feet  MUSCLE LENGTH: Very tight HS and piriformis POSTURE: rounded shoulders and forward head  PALPATION: Tight and tender in the lumbar area and into the buttocks  Uses a rail for bed mobility  LUMBAR ROM:   AROM eval  Flexion Decreased 75%  Extension Decreased 75%  Right lateral flexion Decreased 75%  Left lateral flexion Decreased 75%  Right rotation   Left rotation    (Blank rows = not tested)  LOWER EXTREMITY ROM:     Active  Right eval Left eval  Hip flexion 70 45  Hip extension    Hip abduction    Hip adduction    Hip internal rotation    Hip external rotation    Knee flexion    Knee extension    Ankle dorsiflexion    Ankle plantarflexion    Ankle inversion    Ankle eversion     (Blank rows = not tested)  LOWER EXTREMITY MMT:    MMT Right eval Left eval  Hip flexion 4 4-  Hip extension    Hip abduction 4 4  Hip adduction    Hip internal rotation    Hip external rotation    Knee flexion 4- 4-  Knee extension 4 4  Ankle dorsiflexion    Ankle plantarflexion  Ankle inversion    Ankle eversion     (Blank rows = not tested)  FUNCTIONAL TESTS:  5 times sit to stand: 32 seconds Timed up and go (TUG): 21 seconds  GAIT: Distance  walked: 60 feet Assistive device utilized: None Level of assistance: Complete Independence Comments: slow, unsteady, limp on the left  TODAY'S TREATMENT:                                                                                                                              DATE:  06/01/22 Nustep level 5 x 5 minutes Walk around the back parking island, SOB with this Side step on and off airex On airex cone toe touches On airex 5# straight arm pulls cues for posture and core Feet on ball K2C, rotation, bridge and isometric abs Passive HS and piriformis stretch STM to the right low back and buttock  05/30/22 NuStep L5 x 6 minutes Soft tissue assessment, remains sore in R gluts Supine clamshells, 2 x 10, G tband Supine ball squeeze, 2 x 10, 5 sec hold Supine pelvic tilts-much difficulty isolating abdominals and not holding her breath. Progressed to bridge with pelvic tilt, painful in R hip. Performed SI assessment- Leg length equal, pelvis equal, ASIS equal, PSIS equal. She reported pain in R SI area with L posterior rotation, ant rotation and R posterior rotation. Sit to stand from elevated mat with G tband at knees for abd, 2# ball in BUE for upper body activation, 10 reps B side step against G tband resistance. 10 reps  05/09/22 Nustep level 5 x 6  40# resisted gait all directions, 10 reps F/B, 5 reps side stepping Feet on ball K2C, trunk rotation small, posterior activation and isometric abs Gentle passive stretch to the HS and the piriformis Side lying L for open books, thoracic spine   05/04/22 Walk outside around the parking island and back to the front door, did fatigue and slow with some shuffling coming up the slope Nustep level 5 x  minutes 12" toe clears, light hand hold Hip abduction in pbars, with right she had pain in the right low back a 5/10, also did hip extension 2x10 some pain but less than abduction 10# straight arm pulls 10# AR press Feet on ball K2C,  trunk rotation, posterior isometrics, abdominal isometrics Green tband hooklying clamshells STM with theragun with her in left sidelying working in the right lumbar and buttock mms  05/02/22 Walk outside around the parking island a few times with CGA due to unsteadiness on the slopes Worked on car transfers she reports a lot of difficulty with this and she does have some struggles so we talked about how to do correctly and then how she was doing it Nustep level 5 x 6 mintues 2.5# hip marches, abduction and extension with walker 2.5# SLR in standing to try to help hip flexion strength for getting in the car Supine feet on ball K2C, trunk  rotation, small posterior activation and then isometric abs Stretches of the HS, piriformis and the hip flexor  04/27/22: Nutsep level 5 x 6 min UE and LE's 5 # B shoulder extension at pulley 10 x 2 Supine on mat for manual stretching for each hip for flexion, flexion with rotation  Feet on ball K2C, trunk rotation, small posterior activation and isometric abs 15 each Dead bug with cues for core stability Standing shoulder pulley rows, 10# 10 x w  Seated for hamstring stretch with strap, 30 sec holds, x 2 each leg 04/24/22  Bike level 4 x 6 minutes Seated ball rollouts 5# straight arm pulls Feet on ball K2C, trunk rotation, small posterior activation and isometric abs Dead bug with cues for core stability Passive Stretch LE's Nustep level 5 x 6 minutes  04/18/22 Bike Level 4 x 5 minutes Seated pball rollouts 3 ways Nustep level 5 x 5 minutes Seated row 15# 2x10 Lats 15# 2x10 Side stepping on and off airex, airex cone toe touches for balance Feet on ball K2C, trunk rotation, small bridge and isometric abs Hooklying ball b/n knees squeeze Green tband clamshells PROM HS and piriformis SLR 2x10 bilateral cues for core engagement  PATIENT EDUCATION:  Education details: POC and HEP Person educated: Patient Education method: Consulting civil engineer,  Media planner, and Handouts Education comprehension: verbalized understanding  HOME EXERCISE PROGRAM: Access Code: [email or web address] URL: [email or web address] Date: 04/03/2022 Prepared by: Lum Babe  Exercises - Supine Bridge  - 1 x daily - 7 x weekly - 2 sets - 10 reps - 3 hold - Supine Posterior Pelvic Tilt  - 1 x daily - 7 x weekly - 2 sets - 10 reps - 3 hold - Hooklying Single Knee to Chest Stretch  - 1 x daily - 7 x weekly - 2 sets - 10 reps - 3 hold - Standing Bilateral Low Shoulder Row with Anchored Resistance  - 1 x daily - 7 x weekly - 3 sets- 10 reps - 3 hold - Shoulder extension with resistance - Neutral  - 1 x daily - 7 x weekly - 3 sets - 10 reps - 3 hold  ASSESSMENT:  CLINICAL IMPRESSION: Patient reports she is still having pain in R post hip and glute after fall a couple weeks ago. We did a short walk and she was SOB with this.  She is very tender in the right buttock and into the right lumbar area.    OBJECTIVE IMPAIRMENTS: Abnormal gait, cardiopulmonary status limiting activity, decreased activity tolerance, decreased balance, decreased endurance, decreased mobility, difficulty walking, decreased ROM, decreased strength, decreased safety awareness, increased muscle spasms, impaired flexibility, improper body mechanics, postural dysfunction, and pain.    REHAB POTENTIAL: Good  CLINICAL DECISION MAKING: Evolving/moderate complexity  EVALUATION COMPLEXITY: Low   GOALS: Goals reviewed with patient? Yes  SHORT TERM GOALS: Target date: 04/17/22  Independent with initial HEP Goal status: met 05/04/22 LONG TERM GOALS: Target date: 07/03/22  Independent with advanced HEP Baseline:  Goal status: INITIAL  2.  Decrease TUG to 13 seconds Baseline: 22 seconds 05/15/22:10.57 Goal status:  met as of today  3.  Be able to put shoes and socks on without difficulty  Baseline:  Goal status: progressing 05/02/22,   05/30/22  uses shoe horn to don shoes but husband  has to assist with socks  4.  Increase LE strength to 4+/5 Baseline:  Goal status: progressing 05/02/22 05/30/22:  still weak B hip abduction  5.  Be able to garden 1  hour without pain >5/10 Baseline:  Goal status:progressing 05/02/22 05/15/22:  pain 4/10 for gardening, pain increases after 20 min of activity  PLAN:  PT FREQUENCY: 2x/week  PT DURATION: 12 weeks  PLANNED INTERVENTIONS: Therapeutic exercises, Therapeutic activity, Neuromuscular re-education, Balance training, Gait training, Patient/Family education, Self Care, Joint mobilization, Dry Needling, Electrical stimulation, Cryotherapy, Moist heat, Manual therapy, and Re-evaluation.  PLAN FOR NEXT SESSION:  See if she is feeling better and progress as tolerated  Lum Babe, PT 06/01/22 1:27 PM

## 2022-06-05 DIAGNOSIS — R509 Fever, unspecified: Secondary | ICD-10-CM | POA: Diagnosis not present

## 2022-06-06 ENCOUNTER — Encounter: Payer: Self-pay | Admitting: Cardiology

## 2022-06-06 ENCOUNTER — Other Ambulatory Visit (HOSPITAL_COMMUNITY): Payer: Self-pay | Admitting: Physician Assistant

## 2022-06-06 ENCOUNTER — Ambulatory Visit: Payer: Medicare Other | Admitting: Physical Therapy

## 2022-06-06 DIAGNOSIS — R509 Fever, unspecified: Secondary | ICD-10-CM

## 2022-06-08 ENCOUNTER — Ambulatory Visit: Payer: Medicare Other | Admitting: Physical Therapy

## 2022-06-16 ENCOUNTER — Ambulatory Visit (HOSPITAL_BASED_OUTPATIENT_CLINIC_OR_DEPARTMENT_OTHER)
Admission: RE | Admit: 2022-06-16 | Discharge: 2022-06-16 | Disposition: A | Discharge status: Home / Self Care | Payer: Medicare Other | Source: Ambulatory Visit | Attending: Physician Assistant | Admitting: Physician Assistant

## 2022-06-16 DIAGNOSIS — I700 Atherosclerosis of aorta: Secondary | ICD-10-CM | POA: Diagnosis not present

## 2022-06-16 DIAGNOSIS — R509 Fever, unspecified: Secondary | ICD-10-CM | POA: Diagnosis not present

## 2022-06-16 DIAGNOSIS — K5730 Diverticulosis of large intestine without perforation or abscess without bleeding: Secondary | ICD-10-CM | POA: Diagnosis not present

## 2022-06-16 LAB — POCT I-STAT CREATININE: Creatinine, Ser: 1.2 mg/dL — ABNORMAL HIGH (ref 0.44–1.00)

## 2022-06-16 MED ORDER — IOHEXOL 300 MG/ML  SOLN
100.0000 mL | Freq: Once | INTRAMUSCULAR | Status: AC | PRN
  Administered 2022-06-16: 80 mL via INTRAVENOUS

## 2022-07-04 ENCOUNTER — Encounter: Payer: Self-pay | Admitting: Internal Medicine

## 2022-07-04 ENCOUNTER — Ambulatory Visit (INDEPENDENT_AMBULATORY_CARE_PROVIDER_SITE_OTHER): Payer: Medicare Other | Admitting: Internal Medicine

## 2022-07-04 ENCOUNTER — Other Ambulatory Visit: Payer: Self-pay

## 2022-07-04 ENCOUNTER — Telehealth: Payer: Self-pay

## 2022-07-04 VITALS — BP 175/92 | HR 69 | Temp 97.3°F | Resp 16 | Ht 65.0 in | Wt 236.0 lb

## 2022-07-04 DIAGNOSIS — M4626 Osteomyelitis of vertebra, lumbar region: Secondary | ICD-10-CM

## 2022-07-04 DIAGNOSIS — R509 Fever, unspecified: Secondary | ICD-10-CM | POA: Insufficient documentation

## 2022-07-04 DIAGNOSIS — N393 Stress incontinence (female) (male): Secondary | ICD-10-CM | POA: Insufficient documentation

## 2022-07-04 DIAGNOSIS — N9089 Other specified noninflammatory disorders of vulva and perineum: Secondary | ICD-10-CM | POA: Insufficient documentation

## 2022-07-04 MED ORDER — ALPRAZOLAM 0.25 MG PO TABS: 0.5000 mg | ORAL_TABLET | Freq: Once | ORAL | 0 refills | Status: DC

## 2022-07-04 MED ORDER — ALPRAZOLAM 0.25 MG PO TABS: 0.5000 mg | ORAL_TABLET | Freq: Once | ORAL | 0 refills | Status: AC

## 2022-07-04 NOTE — Telephone Encounter (Signed)
Patient called stating her prescription for xanax was not sent to [facility] today for her MRI in the morning.

## 2022-07-04 NOTE — Assessment & Plan Note (Signed)
She has had ongoing low-grade temperatures since her spinal hardware surgery in October 2023.  Discussed that technically she does not meet criteria for FUO.  However, given her elevated temps and night sweats, I think the most concerning thing at this point is some sort of hardware associated infection in her lumbar sacral area.  Will obtain MRI lumbar and sacrum with and without contrast as quickly as possible to determine if there is a source of infection in this area.  If not, we will then further embark on workup but I think her back warrants sooner investigation.  Check labs today including blood cultures x 2.  Plan for follow-up in 1 to 2 weeks following MRI results.

## 2022-07-04 NOTE — Progress Notes (Addendum)
[facility]  Reason for Consult:FUO  Referring Provider: Dr Ardean Larsen   HPI:    [name] is a 72 y.o. female with PMHx as below who presents to the clinic for fever of unknown origin.   Patient was referred by her primary care physician at [facility] internal medicine.  She was seen recently in their clinic on 06/05/2022.  Patient reports a history of back surgery in October 2023 where she underwent revision L1-S1 laminectomy and fusion.  Since that time, she has had low grade fevers.  Per the documentation, within a few days of her discharge back in October she began having fevers and her wound looked red.  She sent a picture into her surgeon who placed her on an antibiotic with cephalexin x 5 days.  Patient was seen by her surgeon approximately 4 weeks after discharge and reports there were no concerns for infection at that time.  She however was continuing to run fevers at night and reports her temperature is usually around 100 degrees.  During the month of December, apparently she started having a lot of itching with ongoing fever as well as rash.  She had a new bed delivered shortly after her back surgery and the rash was worse in the evening and morning.  She reports that she was treated for scabies.  She went to an urgent care in December as well with diarrhea and nausea.  She was tested for C. difficile but the test not done due to formed stools.  Her GI PCR panel was negative..    Early March she was having respiratory symptoms and tested positive at home for COVID.  She was given Paxlovid.   She continues to documents her temperature over 99 degrees on many evenings and occasionally greater than 100.  However, she reports none greater than 100.3 other than when she had COVID.  She in general does just not feel well and is concerned about what the cause could be.  She also will have a malar rash when coming in from the outside and has some  arthritic changes to her hands.   Patient's primary care embarked on laborious effort to find an etiology of fevers.  This includes a CBC that was normal, CMP unremarkable, ESR 20 (upper limit of normal 20), ANA negative, CRP borderline elevated, rheumatoid factor negative, HIV negative.  CT chest, abdomen, pelvis with contrast was performed on 06/16/2022.  This was an unremarkable CT scan with no abnormality, no lymphadenopathy, and only minimal sigmoid diverticulosis.  Of note the CT mentions her bilateral posterior spinal fusion L1-S1 with no aggressive appearing focal osseous lesions.  Patient's Medications  New Prescriptions   No medications on file  Previous Medications   AMITRIPTYLINE (ELAVIL) 10 MG TABLET    Take 20 mg by mouth at bedtime.    APPLE CID VN-GRN TEA-BIT OR-CR (APPLE CIDER VINEGAR PLUS) TABS    Take 1 tablet by mouth daily.   CHOLECALCIFEROL (VITAMIN D) 1000 UNITS TABLET    Take 1,000 Units by mouth 2 (two) times daily.    CICLOPIROX (PENLAC) 8 % SOLUTION    Apply topically at bedtime. Apply to nail/surrounding skin and daily over previous coat. Every 7 days remove with alcohol and continue.   CLONAZEPAM (KLONOPIN) 1 MG TABLET    Take 1 tablet (1 mg total) by mouth at bedtime as needed for anxiety.   ESOMEPRAZOLE (NEXIUM) 40 MG CAPSULE    Take  40 mg by mouth daily.   FLUTICASONE (FLONASE) 50 MCG/ACT NASAL SPRAY    Place 1 spray into both nostrils daily as needed for allergies.    FUROSEMIDE (LASIX) 20 MG TABLET    Daily for 1 week then weekly as needed for swelling.   GABAPENTIN (NEURONTIN) 100 MG CAPSULE    Take 100 mg by mouth daily. Take 3 tablets at night   HYOSCYAMINE SULFATE SL (LEVSIN/SL) 0.125 MG SUBL    Place 0.125 mg under the tongue 4 (four) times daily.   LEVOTHYROXINE (SYNTHROID) 88 MCG TABLET    Take 88 mcg by mouth at bedtime.   MELOXICAM (MOBIC) 15 MG TABLET    Take 15 mg by mouth daily as needed for pain.   METOPROLOL SUCCINATE (TOPROL-XL) 25 MG 24 HR TABLET     Take 1 tablet (25 mg total) by mouth 2 (two) times daily.   MULTIPLE VITAMINS-MINERALS (MULTIVITAMIN ADULT PO)    Take 1 tablet by mouth daily.    SPIRONOLACTONE (ALDACTONE) 25 MG TABLET    Take 1 tablet (25 mg total) by mouth daily as needed.   SUCRALFATE (CARAFATE) 1 G TABLET    Take 1 g by mouth daily.   TIZANIDINE (ZANAFLEX) 2 MG TABLET    Take 2 mg by mouth 2 (two) times daily as needed. Reported on 04/05/2015   TURMERIC 500 MG CAPS    Take 1,000 mg by mouth daily.   ZINC GLUCONATE 50 MG TABLET    Take 50 mg by mouth daily.  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 Dulce Sellar per patient report.  Overview:  CAD risk score is low at 3%   Chondromalacia patellae 12/02/2012   Chronic back pain    Chronic kidney disease    Chronic knee pain    Chronic pain 09/22/2010   Multiple sources of pain: radicular pain from lumbar disease and post-operative pain; neck pain from cervical disk disease; left knee pain from trauma.  Plan - will wean off prednisone           Will continue percocet at 1  5/325 norco every 6 hours prn           NSAID - trial of meloxicam 71m g once a day           Increase gabapentin slowly to 600mg  tid and reassess           Executed a pain con   Chronic venous insufficiency 10/10/2013   Colitis 10/21/2016   Collagen vascular disease    Deformity of joint 04/23/2013   Depression with anxiety 09/21/2010    Derangement of knee, left 2009   started with fall at work. Has had repair patella and torn meniscus   DJD (degenerative joint disease) of knee 09/02/2012   Essential hypertension 07/08/2010   Long standing hypertension medically managed. Currently seeing Dr. Sherilyn Cooter  Smith and Dr. Doy Mince ([facility]) for cardiology.  Meds  HCT only  Overview:  Overview:  Long standing hypertension medically managed. Currently seeing Dr. Verdis Prime and Dr. Dulce Sellar ([facility]) for cardiology.  Meds  HCT only  Last Assessment & Plan:  Formatting of this note may be different from the original. BP Readings   Fever chills 10/21/2016   Fibromyalgia 02/27/2017   High cholesterol    Hx of appendectomy    Hypertension    Hypertensive chronic kidney disease 07/08/2010   Long standing hypertension medically managed. Currently seeing Dr. Verdis Prime and Dr. Doy Mince ([facility]) for cardiology.  Meds  HCT only  Overview:  Overview:  Long standing hypertension medically managed. Currently seeing Dr. Verdis Prime and Dr. Dulce Sellar ([facility]) for cardiology.  Meds  HCT only  Last Assessment & Plan:  Formatting of this note may be different from the original. BP Readings   Knee internal derangement 07/08/2010   Left knee: Feb '09 medial and lateral meniscal tears by MRI                  April '09 arthroscopic surgery    Melanoma 11/19/2015   Migraine headache    hormonal related - less frequent off hormone replacement   Migraines    Neuropathy 04/23/2013   PAT (paroxysmal atrial tachycardia) 02/17/2018   Peripheral cyanosis 09/28/2012   Full evaluation by Dr. Azzie Roup for rheumatology - no evidence of Raynaud's syndrome or disease; no evidence of underlying rheumatologic disease or malignancy. Most probable cause is neurogenic.    Pseudoarthrosis of lumbar spine 01/02/2011   Psoriasis 04/23/2013   Raynaud disease    Routine health maintenance 08/17/2011   Colonoscopy - no record in EPIC of colonoscopy  Immunizations -  Tdap June '13.     Salmonella enteritis 10/23/2016   Scabies    Sepsis 10/23/2016   Spinal stenosis 08/14/2010   history of cervical laminectomy, lumbar diskectomy with fixation devices and redo surgery.    Status post lumbar spinal fusion 10/02/2011   SVT (supraventricular tachycardia)    Synovial cyst of lumbar facet joint    lower back - s/p surgery    Tear of lateral meniscus of left knee 09/30/2012    Social History   Tobacco Use   Smoking status: Never   Smokeless tobacco: Never  Vaping Use   Vaping Use: Never used  Substance Use Topics   Alcohol use: No   Drug use: No    Family History  Problem Relation Age of Onset   Diabetes Mother    COPD Mother    Aortic aneurysm Mother    Hypertension Mother    Hypothyroidism Mother    Cancer Father        lung   Hyperlipidemia Father    Hypertension Father    Heart disease Father    Diabetes Father    Heart attack Father    Diabetes Brother     Allergies  Allergen Reactions   Epinephrine Palpitations    NASAL SPRAY Cardiac dysrhythmia     Nsaids Swelling    swelling     Other Swelling and Palpitations    Glucocorticoids/Steroids **ANTI INFLAMMATORY DRUGS**    Pregabalin Anaphylaxis   Tolmetin Swelling   Ciprofloxacin     Other reaction(s): nausea, Unknown   Parathyroid Hormone (Recomb)     Other reaction(s): nose bleeds, Unknown   Prednisone Swelling   Tape Rash and Other (See Comments)    *adhesive* Reaction- blisters  Rips her skin     Tapentadol Other (See Comments)    *adhesive* Reaction- blisters    Captopril    Lisinopril Swelling and Other (See Comments)    Dizziness     Celecoxib Palpitations    Cardiac dysrhythmia    Codeine Palpitations    Cardiac dysrhythmia     Lidocaine Hcl Palpitations   Procaine Palpitations and Other (See Comments)    dizziness    Sulfa Antibiotics Palpitations    Cardiac dysrhythmia    Sulfasalazine Palpitations    Review of Systems  All  other systems reviewed and are negative.  Except as noted above.    OBJECTIVE:    Vitals:   07/04/22 1343  BP: (!) 164/92  Pulse: 69  Resp: 16  Temp: (!) 97.3 F (36.3 C)  TempSrc: Temporal  Weight: 236 lb (107 kg)  Height: 5\' 5"  (1.651 m)     Body mass index is 39.27 kg/m.  Physical Exam Constitutional:      General: She is not in acute distress.    Appearance: Normal appearance.  HENT:     Head: Normocephalic and atraumatic.  Eyes:     Extraocular Movements: Extraocular movements intact.     Conjunctiva/sclera: Conjunctivae normal.  Cardiovascular:     Pulses: Normal pulses.  Pulmonary:     Effort: Pulmonary effort is normal. No respiratory distress.  Abdominal:     General: There is no distension.     Palpations: Abdomen is soft.     Tenderness: There is no abdominal tenderness.  Musculoskeletal:        General: Normal range of motion.     Cervical back: Normal range of motion and neck supple.  Skin:    General: Skin is warm and dry.     Findings: No rash.  Neurological:     General: No focal deficit present.     Mental Status: She is alert and oriented to person, place, and time.  Psychiatric:        Mood and Affect: Mood normal.        Behavior: Behavior normal.      Labs and Microbiology:     Latest Ref Rng & Units 02/12/2022   12:54 PM 05/23/2018    9:42 PM 05/23/2018    3:35 PM  CBC  WBC 3.4 - 10.8 x10E3/uL 9.1  8.8  10.1   Hemoglobin 11.1 - 15.9 g/dL 40.9  81.1  91.4   Hematocrit 34.0 - 46.6 % 43.2  45.2  49.3   Platelets 150 - 450 x10E3/uL 336  233  241       Latest Ref Rng & Units 06/16/2022    6:57 PM 02/12/2022   12:54 PM 10/24/2018    3:43 PM  CMP  Glucose 70 - 99 mg/dL  98  782   BUN 8 - 27 mg/dL  12  16   Creatinine 9.56 - 1.00 mg/dL 2.13  0.86  5.78   Sodium 134 - 144 mmol/L  139  141   Potassium 3.5 - 5.2 mmol/L  5.6  4.5   Chloride 96 - 106 mmol/L  101  100   CO2 20 - 29 mmol/L  23  26   Calcium 8.7 - 10.3 mg/dL  9.8  9.5    Total Protein 6.0 - 8.5 g/dL  7.6    Total Bilirubin 0.0 - 1.2 mg/dL  0.2    Alkaline Phos 44 - 121 IU/L  126    AST  0 - 40 IU/L  77    ALT 0 - 32 IU/L  52       No results found for this or any previous visit (from the past 240 hour(s)).  Imaging:    ASSESSMENT & PLAN:    Fever of unknown origin (FUO) She has had ongoing low-grade temperatures since her spinal hardware surgery in October 2023.  Discussed that technically she does not meet criteria for FUO.  However, given her elevated temps and night sweats, I think the most concerning thing at this point is some sort of hardware associated infection in her lumbar sacral area.  Will obtain MRI lumbar and sacrum with and without contrast as quickly as possible to determine if there is a source of infection in this area.  If not, we will then further embark on workup but I think her back warrants sooner investigation.  Check labs today including blood cultures x 2.  Plan for follow-up in 1 to 2 weeks following MRI results.   Orders Placed This Encounter  Procedures   Blood culture (routine single)   Blood culture (routine single)   MR Lumbar Spine W Wo Contrast    Standing Status:   Future    Standing Expiration Date:   07/04/2023    Order Specific Question:   If indicated for the ordered procedure, I authorize the administration of contrast media per Radiology protocol    Answer:   Yes    Order Specific Question:   What is the patient's sedation requirement?    Answer:   No Sedation    Order Specific Question:   Does the patient have a pacemaker or implanted devices?    Answer:   No    Order Specific Question:   Preferred imaging location?    Answer:   [facility] (table limit - 500 lbs)   MR SACRUM SI JOINTS W WO CONTRAST    Standing Status:   Future    Standing Expiration Date:   07/04/2023    Order Specific Question:   If indicated for the ordered procedure, I authorize the administration of contrast media per  Radiology protocol    Answer:   Yes    Order Specific Question:   What is the patient's sedation requirement?    Answer:   No Sedation    Order Specific Question:   Does the patient have a pacemaker or implanted devices?    Answer:   No    Order Specific Question:   Preferred imaging location?    Answer:   [facility] (table limit - 500 lbs)   Sedimentation rate   C-reactive protein   CBC w/Diff   Comp Met (CMET)     Addendum 3:54 PM Patient reports anxiety with MRI.  Will give Xanax 0.25mg  x 1 dose.   Vedia Coffer for [facility] 07/04/2022, 2:10 PM   I have personally spent involved in face-to-face and non-face-to-face activities for this patient on the day of the visit. Professional time spent includes the following activities: Preparing to see the patient (review of tests), Obtaining and/or reviewing separately obtained history (admission/discharge record), Performing a medically appropriate examination and/or evaluation , Ordering medications/tests/procedures, referring and communicating with other health care professionals, Documenting clinical information in the EMR, Independently interpreting results (not separately reported), Communicating results to the patient/family/caregiver, Counseling and educating the patient/family/caregiver and Care coordination (not separately reported).

## 2022-07-04 NOTE — Addendum Note (Signed)
Addended by: Kathlynn Grate on: 07/04/2022 03:56 PM   Modules accepted: Orders

## 2022-07-05 ENCOUNTER — Telehealth: Payer: Self-pay

## 2022-07-05 ENCOUNTER — Encounter (HOSPITAL_COMMUNITY): Payer: Self-pay

## 2022-07-05 ENCOUNTER — Ambulatory Visit (HOSPITAL_COMMUNITY)
Admission: RE | Admit: 2022-07-05 | Discharge: 2022-07-05 | Disposition: A | Discharge status: Home / Self Care | Payer: Medicare Other | Source: Ambulatory Visit | Attending: Internal Medicine | Admitting: Internal Medicine

## 2022-07-05 DIAGNOSIS — M4626 Osteomyelitis of vertebra, lumbar region: Secondary | ICD-10-CM

## 2022-07-05 DIAGNOSIS — R509 Fever, unspecified: Secondary | ICD-10-CM

## 2022-07-05 DIAGNOSIS — R600 Localized edema: Secondary | ICD-10-CM | POA: Diagnosis not present

## 2022-07-05 DIAGNOSIS — M4326 Fusion of spine, lumbar region: Secondary | ICD-10-CM | POA: Diagnosis not present

## 2022-07-05 MED ORDER — GADOBUTROL 1 MMOL/ML IV SOLN
10.0000 mL | Freq: Once | INTRAVENOUS | Status: AC | PRN
  Administered 2022-07-05: 10 mL via INTRAVENOUS

## 2022-07-05 NOTE — Telephone Encounter (Signed)
Patient is aware of results. She has a question about Tralov cyst in her sacrum area per what she read online it can cause pain and weakness in legs and she was wondering if that cyst is cause of those problems.

## 2022-07-05 NOTE — Telephone Encounter (Signed)
-----   Message from Kathlynn Grate, DO sent at 07/05/2022 11:17 AM EDT ----- Can you please let patient know that MRI was overall reassuring.  No obvious infection noted.  There was a very small fluid collection at surgical site that is likely post operative and does not have a suspicious appearance per the radiologist.  I would like her to keep a daily temperature diary and record the numbers for [place] to determine how high her fevers are and bring that to follow up appointment.  Her liver enzymes are slightly elevated but stable from December.  When she returns we will plan to screen for viral hepatitis infection but her recent CT showed normal appearance of the liver and biliary system.

## 2022-07-10 ENCOUNTER — Encounter: Payer: Self-pay | Admitting: Internal Medicine

## 2022-07-10 LAB — COMPREHENSIVE METABOLIC PANEL
AG Ratio: 1.3 (calc) (ref 1.0–2.5)
ALT: 50 U/L — ABNORMAL HIGH (ref 6–29)
AST: 57 U/L — ABNORMAL HIGH (ref 10–35)
Albumin: 4.2 g/dL (ref 3.6–5.1)
Alkaline phosphatase (APISO): 109 U/L (ref 37–153)
BUN: 15 mg/dL (ref 7–25)
CO2: 26 mmol/L (ref 20–32)
Calcium: 9.8 mg/dL (ref 8.6–10.4)
Chloride: 101 mmol/L (ref 98–110)
Creat: 0.97 mg/dL (ref 0.60–1.00)
Globulin: 3.2 g/dL (calc) (ref 1.9–3.7)
Glucose, Bld: 86 mg/dL (ref 65–99)
Potassium: 4.6 mmol/L (ref 3.5–5.3)
Sodium: 139 mmol/L (ref 135–146)
Total Bilirubin: 0.4 mg/dL (ref 0.2–1.2)
Total Protein: 7.4 g/dL (ref 6.1–8.1)

## 2022-07-10 LAB — CULTURE, BLOOD (SINGLE)
MICRO NUMBER:: 14863261
MICRO NUMBER:: 14863262
Result:: NO GROWTH
SPECIMEN QUALITY:: ADEQUATE

## 2022-07-10 LAB — CBC WITH DIFFERENTIAL/PLATELET
Absolute Monocytes: 462 cells/uL (ref 200–950)
Basophils Absolute: 68 cells/uL (ref 0–200)
Basophils Relative: 1 %
Eosinophils Absolute: 374 cells/uL (ref 15–500)
Eosinophils Relative: 5.5 %
HCT: 43.4 % (ref 35.0–45.0)
Hemoglobin: 14.6 g/dL (ref 11.7–15.5)
Lymphs Abs: 2169 cells/uL (ref 850–3900)
MCH: 29.3 pg (ref 27.0–33.0)
MCHC: 33.6 g/dL (ref 32.0–36.0)
MCV: 87 fL (ref 80.0–100.0)
MPV: 9.6 fL (ref 7.5–12.5)
Monocytes Relative: 6.8 %
Neutro Abs: 3726 cells/uL (ref 1500–7800)
Neutrophils Relative %: 54.8 %
Platelets: 299 10*3/uL (ref 140–400)
RBC: 4.99 10*6/uL (ref 3.80–5.10)
RDW: 14.1 % (ref 11.0–15.0)
Total Lymphocyte: 31.9 %
WBC: 6.8 10*3/uL (ref 3.8–10.8)

## 2022-07-10 LAB — C-REACTIVE PROTEIN: CRP: 12.5 mg/L — ABNORMAL HIGH (ref <8.0)

## 2022-07-10 LAB — SEDIMENTATION RATE: Sed Rate: 19 mm/h (ref 0–30)

## 2022-07-25 ENCOUNTER — Other Ambulatory Visit: Payer: Self-pay

## 2022-07-25 ENCOUNTER — Encounter: Payer: Self-pay | Admitting: Internal Medicine

## 2022-07-25 ENCOUNTER — Ambulatory Visit (INDEPENDENT_AMBULATORY_CARE_PROVIDER_SITE_OTHER): Payer: Medicare Other | Admitting: Internal Medicine

## 2022-07-25 VITALS — BP 141/75 | HR 69 | Temp 98.0°F | Ht 68.0 in | Wt 238.0 lb

## 2022-07-25 DIAGNOSIS — R7989 Other specified abnormal findings of blood chemistry: Secondary | ICD-10-CM | POA: Diagnosis not present

## 2022-07-25 DIAGNOSIS — R509 Fever, unspecified: Secondary | ICD-10-CM | POA: Diagnosis not present

## 2022-07-25 DIAGNOSIS — Z7289 Other problems related to lifestyle: Secondary | ICD-10-CM

## 2022-07-25 DIAGNOSIS — E039 Hypothyroidism, unspecified: Secondary | ICD-10-CM | POA: Diagnosis not present

## 2022-07-25 DIAGNOSIS — Z114 Encounter for screening for human immunodeficiency virus [HIV]: Secondary | ICD-10-CM

## 2022-07-25 NOTE — Assessment & Plan Note (Signed)
LFTs noted to be mildly elevated possibly related to NASH.  Will check hepatitis serology today as part of FUO work up. Liver was notably normal on recent CT C/A/P.

## 2022-07-25 NOTE — Progress Notes (Signed)
[facility]  CHIEF COMPLAINT:    Follow up for FUO  SUBJECTIVE:    [name] is a 72 y.o. female with PMHx as below who presents to the clinic for FUO.   Patient presents today for follow-up.  She was last seen approximately 3 weeks ago on 07/04/2022.  During that interval, she had a MRI of the lumbar spine and sacrum which did not show any evidence of discitis/osteomyelitis and no source for fevers was identified.  There was a small nonspecific fluid collection in the laminectomy bed without suspicious enhancement.  She presents today with her fever diary and temperatures ranging from 97.9-100.1.  She otherwise does not report any new or localizing symptoms. Her main complaint is continued drenching sweats and a feeling that something is not right.  She has a small psoriatic plaque on her right elbow.  Please see A&P for the details of today's visit and status of the patient's medical problems.   Patient's Medications  New Prescriptions   No medications on file  Previous Medications   AMITRIPTYLINE (ELAVIL) 10 MG TABLET    Take 20 mg by mouth at bedtime.    APPLE CID VN-GRN TEA-BIT OR-CR (APPLE CIDER VINEGAR PLUS) TABS    Take 1 tablet by mouth daily.   CHOLECALCIFEROL (VITAMIN D) 1000 UNITS TABLET    Take 1,000 Units by mouth 2 (two) times daily.    CICLOPIROX (PENLAC) 8 % SOLUTION    Apply topically at bedtime. Apply to nail/surrounding skin and daily over previous coat. Every 7 days remove with alcohol and continue.   CLONAZEPAM (KLONOPIN) 1 MG TABLET    Take 1 tablet (1 mg total) by mouth at bedtime as needed for anxiety.   ESOMEPRAZOLE (NEXIUM) 40 MG CAPSULE    Take 40 mg by mouth daily.   FLUTICASONE (FLONASE) 50 MCG/ACT NASAL SPRAY    Place 1 spray into both nostrils daily as needed for allergies.    FUROSEMIDE (LASIX) 20 MG TABLET    Daily for 1 week then weekly as needed for swelling.   GABAPENTIN (NEURONTIN) 100 MG CAPSULE     Take 100 mg by mouth daily. Take 3 tablets at night   HYOSCYAMINE SULFATE SL (LEVSIN/SL) 0.125 MG SUBL    Place 0.125 mg under the tongue 4 (four) times daily.   LEVOTHYROXINE (SYNTHROID) 88 MCG TABLET    Take 88 mcg by mouth at bedtime.   MELOXICAM (MOBIC) 15 MG TABLET    Take 15 mg by mouth daily as needed for pain.   METOPROLOL SUCCINATE (TOPROL-XL) 25 MG 24 HR TABLET    Take 1 tablet (25 mg total) by mouth 2 (two) times daily.   MULTIPLE VITAMINS-MINERALS (MULTIVITAMIN ADULT PO)    Take 1 tablet by mouth daily.    SPIRONOLACTONE (ALDACTONE) 25 MG TABLET    Take 1 tablet (25 mg total) by mouth daily as needed.   SUCRALFATE (CARAFATE) 1 G TABLET    Take 1 g by mouth daily.   TIZANIDINE (ZANAFLEX) 2 MG TABLET    Take 2 mg by mouth 2 (two) times daily as needed. Reported on 04/05/2015   TURMERIC 500 MG CAPS    Take 1,000 mg by mouth daily.   ZINC GLUCONATE 50 MG TABLET    Take 50 mg by mouth daily.  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 Dulce Sellar per patient report.  Overview:  CAD risk score is low at 3%   Chondromalacia patellae 12/02/2012   Chronic back pain    Chronic kidney disease    Chronic knee pain    Chronic pain 09/22/2010   Multiple sources of pain: radicular pain from lumbar disease and post-operative pain; neck pain from cervical disk disease; left knee pain from trauma.  Plan - will wean off prednisone            Will continue percocet at 1  5/325 norco every 6 hours prn           NSAID - trial of meloxicam 33m g once a day           Increase gabapentin slowly to 600mg  tid and reassess           Executed a pain con   Chronic venous insufficiency 10/10/2013   Colitis 10/21/2016   Collagen vascular disease (HCC)    Deformity of joint 04/23/2013   Depression with anxiety 09/21/2010   Derangement of knee, left 2009   started with fall at work. Has had repair patella and torn meniscus   DJD (degenerative joint disease) of knee 09/02/2012   Essential hypertension 07/08/2010   Long standing hypertension medically managed. Currently seeing Dr. Verdis Prime and Dr. Doy Mince ([facility]) for cardiology.  Meds  HCT only  Overview:  Overview:  Long standing hypertension medically managed. Currently seeing Dr. Verdis Prime and Dr. Dulce Sellar ([facility]) for cardiology.  Meds  HCT only  Last Assessment & Plan:  Formatting of this note may be different from the original. BP Readings   Fever chills 10/21/2016   Fibromyalgia 02/27/2017   High cholesterol    Hx of appendectomy    Hypertension    Hypertensive chronic kidney disease 07/08/2010   Long standing hypertension medically managed. Currently seeing Dr. Verdis Prime and Dr. Doy Mince ([facility]) for cardiology.  Meds  HCT only  Overview:  Overview:  Long standing hypertension medically managed. Currently seeing Dr. Verdis Prime and Dr. Dulce Sellar ([facility]) for cardiology.  Meds  HCT only  Last Assessment & Plan:  Formatting of this note may be different from the original. BP Readings   Knee internal derangement 07/08/2010   Left knee: Feb '09 medial and lateral meniscal tears by MRI                  April '09 arthroscopic surgery    Melanoma (HCC) 11/19/2015   Migraine headache    hormonal related - less frequent off hormone replacement   Migraines    Neuropathy 04/23/2013   PAT (paroxysmal atrial tachycardia) 02/17/2018   Peripheral cyanosis 09/28/2012   Full  evaluation by Dr. Azzie Roup for rheumatology - no evidence of Raynaud's syndrome or disease; no evidence of underlying rheumatologic disease or malignancy. Most probable cause is neurogenic.    Pseudoarthrosis of lumbar spine 01/02/2011   Psoriasis 04/23/2013   Raynaud disease  Routine health maintenance 08/17/2011   Colonoscopy - no record in EPIC of colonoscopy  Immunizations - Tdap June '13.     Salmonella enteritis 10/23/2016   Scabies    Sepsis (HCC) 10/23/2016   Spinal stenosis 08/14/2010   history of cervical laminectomy, lumbar diskectomy with fixation devices and redo surgery.    Status post lumbar spinal fusion 10/02/2011   SVT (supraventricular tachycardia)    Synovial cyst of lumbar facet joint    lower back - s/p surgery    Tear of lateral meniscus of left knee 09/30/2012    Social History   Tobacco Use   Smoking status: Never   Smokeless tobacco: Never  Vaping Use   Vaping Use: Never used  Substance Use Topics   Alcohol use: No   Drug use: No    Family History  Problem Relation Age of Onset   Diabetes Mother    COPD Mother    Aortic aneurysm Mother    Hypertension Mother    Hypothyroidism Mother    Cancer Father        lung   Hyperlipidemia Father    Hypertension Father    Heart disease Father    Diabetes Father    Heart attack Father    Diabetes Brother     Allergies  Allergen Reactions   Epinephrine Palpitations    NASAL SPRAY Cardiac dysrhythmia     Nsaids Swelling    swelling     Other Swelling and Palpitations    Glucocorticoids/Steroids **ANTI INFLAMMATORY DRUGS**    Pregabalin Anaphylaxis   Tolmetin Swelling   Ciprofloxacin     Other reaction(s): nausea, Unknown   Parathyroid Hormone (Recomb)     Other reaction(s): nose bleeds, Unknown   Prednisone Swelling   Tape Rash and Other (See Comments)    *adhesive* Reaction- blisters  Rips her skin     Tapentadol Other (See Comments)    *adhesive* Reaction- blisters     Captopril    Lisinopril Swelling and Other (See Comments)    Dizziness     Celecoxib Palpitations    Cardiac dysrhythmia    Codeine Palpitations    Cardiac dysrhythmia     Lidocaine Hcl Palpitations   Procaine Palpitations and Other (See Comments)    dizziness    Sulfa Antibiotics Palpitations    Cardiac dysrhythmia    Sulfasalazine Palpitations    Review of Systems  All other systems reviewed and are negative. Except as otherwise noted above.    OBJECTIVE:    Vitals:   07/25/22 1409  BP: (!) 141/75  Pulse: 69  Temp: 98 F (36.7 C)  TempSrc: Oral  SpO2: 93%  Weight: 238 lb (108 kg)  Height: 5\' 8"  (1.727 m)   Body mass index is 36.19 kg/m.  Physical Exam Constitutional:      General: She is not in acute distress.    Appearance: Normal appearance.  HENT:     Head: Normocephalic and atraumatic.  Eyes:     Extraocular Movements: Extraocular movements intact.     Conjunctiva/sclera: Conjunctivae normal.  Pulmonary:     Effort: Pulmonary effort is normal. No respiratory distress.  Abdominal:     General: There is no distension.     Palpations: Abdomen is soft.  Musculoskeletal:        General: Normal range of motion.     Cervical back: Normal range of motion and neck supple.  Skin:    General: Skin is warm and dry.  Findings: No rash.     Comments: Right elbow with small area of dry skin/plaque.   Neurological:     General: No focal deficit present.     Mental Status: She is alert and oriented to person, place, and time.  Psychiatric:        Mood and Affect: Mood normal.        Behavior: Behavior normal.      Labs and Microbiology:    Latest Ref Rng & Units 07/04/2022    2:15 AM 02/12/2022   12:54 PM 05/23/2018    9:42 PM  CBC  WBC 3.8 - 10.8 Thousand/uL 6.8  9.1  8.8   Hemoglobin 11.7 - 15.5 g/dL 16.1  09.6  04.5   Hematocrit 35.0 - 45.0 % 43.4  43.2  45.2   Platelets 140 - 400 Thousand/uL 299  336  233       Latest Ref Rng & Units  07/04/2022    2:15 AM 06/16/2022    6:57 PM 02/12/2022   12:54 PM  CMP  Glucose 65 - 99 mg/dL 86   98   BUN 7 - 25 mg/dL 15   12   Creatinine 4.09 - 1.00 mg/dL 8.11  9.14  7.82   Sodium 135 - 146 mmol/L 139   139   Potassium 3.5 - 5.3 mmol/L 4.6   5.6   Chloride 98 - 110 mmol/L 101   101   CO2 20 - 32 mmol/L 26   23   Calcium 8.6 - 10.4 mg/dL 9.8   9.8   Total Protein 6.1 - 8.1 g/dL 7.4   7.6   Total Bilirubin 0.2 - 1.2 mg/dL 0.4   0.2   Alkaline Phos 44 - 121 IU/L   126   AST 10 - 35 U/L 57   77   ALT 6 - 29 U/L 50   52        ASSESSMENT & PLAN:    Fever of unknown origin (FUO) Patient here today for follow up of her elevated temperatures.  We reviewed her temperature log and discussed that she is not technically having fevers but she reports her normal ranges closer to 97-98 degrees. I suggested continued observation and recording her temperatures given the negative CT chest/abdomen/pelvis and lumbar/sacrum MRI.  However, she would prefer to embark on further work up to elicit a possible cause of her symptoms.  Will therefore obtain further lab work as noted below and follow up again in 6 weeks.  Of note, the MRI did see a small post-operative fluid collection but no suspicious features to suggest an abscess.  If continued serologic work up is unrevealing and symptoms persist, perhaps we could as IR to aspirate the fluid collection to see if any indolent infection there although think this would be less likely.  Advised to continue keeping her fever diary for now.  Elevated LFTs LFTs noted to be mildly elevated possibly related to NASH.  Will check hepatitis serology today as part of FUO work up. Liver was notably normal on recent CT C/A/P.    Orders Placed This Encounter  Procedures   Hepatitis C antibody   Hepatitis B core antibody, total   Hepatitis B surface antibody,qualitative   Hepatitis B surface antigen   QuantiFERON-TB Gold Plus   ANA Screen,IFA,Reflex  Titer/Pattern,Reflex Mplx 11 Ab Cascade with IdentRA   Rheumatoid Factor   Thyroid Panel With TSH   Sedimentation rate   C-reactive protein   HIV  antibody (with reflex)       Vedia Coffer for Infect[facility] 07/25/2022, 2:46 PM   I have personally spent 30 minutes involved in face-to-face and non-face-to-face activities for this patient on the day of the visit. Professional time spent includes the following activities: Preparing to see the patient (review of tests), Obtaining and/or reviewing separately obtained history (admission/discharge record), Performing a medically appropriate examination and/or evaluation , Ordering medications/tests/procedures, referring and communicating with other health care professionals, Documenting clinical information in the EMR, Independently interpreting results (not separately reported), Communicating results to the patient/family/caregiver, Counseling and educating the patient/family/caregiver and Care coordination (not separately reported).

## 2022-07-25 NOTE — Assessment & Plan Note (Signed)
Patient here today for follow up of her elevated temperatures.  We reviewed her temperature log and discussed that she is not technically having fevers but she reports her normal ranges closer to 97-98 degrees. I suggested continued observation and recording her temperatures given the negative CT chest/abdomen/pelvis and lumbar/sacrum MRI.  However, she would prefer to embark on further work up to elicit a possible cause of her symptoms.  Will therefore obtain further lab work as noted below and follow up again in 6 weeks.  Of note, the MRI did see a small post-operative fluid collection but no suspicious features to suggest an abscess.  If continued serologic work up is unrevealing and symptoms persist, perhaps we could as IR to aspirate the fluid collection to see if any indolent infection there although think this would be less likely.  Advised to continue keeping her fever diary for now.

## 2022-07-28 ENCOUNTER — Encounter: Payer: Self-pay | Admitting: Internal Medicine

## 2022-07-28 ENCOUNTER — Encounter: Payer: Self-pay | Admitting: Physical Therapy

## 2022-07-29 LAB — ANA SCREEN,IFA,REFLEX TITER/PATTERN,REFLEX MPLX 11 AB CASCADE
Anti Nuclear Antibody (ANA): POSITIVE — AB
Cyclic Citrullin Peptide Ab: <16 UNITS
MUTATED CITRULLINATED VIMENTIN (MCV) AB: <20 U/mL (ref <20)
Rheumatoid fact SerPl-aCnc: <10 IU/mL (ref <14)

## 2022-07-29 LAB — C-REACTIVE PROTEIN: CRP: 13.3 mg/L — ABNORMAL HIGH (ref <8.0)

## 2022-07-29 LAB — THYROID PANEL WITH TSH
Free Thyroxine Index: 2.7 (ref 1.4–3.8)
T3 Uptake: 28 % (ref 22–35)
T4, Total: 9.8 ug/dL (ref 5.1–11.9)
TSH: 3.17 mIU/L (ref 0.40–4.50)

## 2022-07-29 LAB — HEPATITIS B SURFACE ANTIBODY,QUALITATIVE: Hep B S Ab: NONREACTIVE

## 2022-07-29 LAB — QUANTIFERON-TB GOLD PLUS
Mitogen-NIL: 7.69 IU/mL
NIL: 0.02 IU/mL
QuantiFERON-TB Gold Plus: NEGATIVE
TB1-NIL: 0 IU/mL
TB2-NIL: 0.02 IU/mL

## 2022-07-29 LAB — TIER 1
Chromatin (Nucleosomal) Antibody: <1 AI
ENA SM Ab Ser-aCnc: <1 AI
Ribonucleic Protein(ENA) Antibody, IgG: <1 AI
SM/RNP: <1 AI
ds DNA Ab: <1 IU/mL

## 2022-07-29 LAB — HIV ANTIBODY (ROUTINE TESTING W REFLEX): HIV 1&2 Ab, 4th Generation: NONREACTIVE

## 2022-07-29 LAB — ANTI-NUCLEAR AB-TITER (ANA TITER): ANA Titer 1: 1:80 {titer} — ABNORMAL HIGH

## 2022-07-29 LAB — TIER 3
Centromere Ab Screen: <1 AI
Ribosomal P Protein Ab: <1 AI

## 2022-07-29 LAB — HEPATITIS B CORE ANTIBODY, TOTAL: Hep B Core Total Ab: NONREACTIVE

## 2022-07-29 LAB — TIER 2
Jo-1 Autoabs: <1 AI
SSA (Ro) (ENA) Antibody, IgG: <1 AI
SSB (La) (ENA) Antibody, IgG: <1 AI
Scleroderma (Scl-70) (ENA) Antibody, IgG: <1 AI

## 2022-07-29 LAB — SEDIMENTATION RATE: Sed Rate: 17 mm/h (ref 0–30)

## 2022-07-29 LAB — HEPATITIS C ANTIBODY: Hepatitis C Ab: NONREACTIVE

## 2022-07-29 LAB — HEPATITIS B SURFACE ANTIGEN: Hepatitis B Surface Ag: NONREACTIVE

## 2022-07-29 LAB — INTERPRETATION

## 2022-07-31 ENCOUNTER — Other Ambulatory Visit: Payer: Self-pay | Admitting: Internal Medicine

## 2022-07-31 DIAGNOSIS — R768 Other specified abnormal immunological findings in serum: Secondary | ICD-10-CM

## 2022-08-17 DIAGNOSIS — E039 Hypothyroidism, unspecified: Secondary | ICD-10-CM | POA: Diagnosis not present

## 2022-08-17 DIAGNOSIS — R768 Other specified abnormal immunological findings in serum: Secondary | ICD-10-CM | POA: Diagnosis not present

## 2022-09-01 DIAGNOSIS — M48062 Spinal stenosis, lumbar region with neurogenic claudication: Secondary | ICD-10-CM | POA: Diagnosis not present

## 2022-09-01 DIAGNOSIS — R5382 Chronic fatigue, unspecified: Secondary | ICD-10-CM | POA: Diagnosis not present

## 2022-09-01 DIAGNOSIS — F419 Anxiety disorder, unspecified: Secondary | ICD-10-CM | POA: Diagnosis not present

## 2022-09-01 DIAGNOSIS — M19049 Primary osteoarthritis, unspecified hand: Secondary | ICD-10-CM | POA: Diagnosis not present

## 2022-09-01 DIAGNOSIS — I700 Atherosclerosis of aorta: Secondary | ICD-10-CM | POA: Diagnosis not present

## 2022-09-01 DIAGNOSIS — I129 Hypertensive chronic kidney disease with stage 1 through stage 4 chronic kidney disease, or unspecified chronic kidney disease: Secondary | ICD-10-CM | POA: Diagnosis not present

## 2022-09-01 DIAGNOSIS — K219 Gastro-esophageal reflux disease without esophagitis: Secondary | ICD-10-CM | POA: Diagnosis not present

## 2022-09-01 DIAGNOSIS — R768 Other specified abnormal immunological findings in serum: Secondary | ICD-10-CM | POA: Diagnosis not present

## 2022-09-01 DIAGNOSIS — E039 Hypothyroidism, unspecified: Secondary | ICD-10-CM | POA: Diagnosis not present

## 2022-09-01 DIAGNOSIS — R509 Fever, unspecified: Secondary | ICD-10-CM | POA: Diagnosis not present

## 2022-09-01 DIAGNOSIS — R748 Abnormal levels of other serum enzymes: Secondary | ICD-10-CM | POA: Diagnosis not present

## 2022-09-05 ENCOUNTER — Ambulatory Visit (INDEPENDENT_AMBULATORY_CARE_PROVIDER_SITE_OTHER): Payer: Medicare Other | Admitting: Infectious Disease

## 2022-09-05 ENCOUNTER — Other Ambulatory Visit: Payer: Self-pay

## 2022-09-05 ENCOUNTER — Encounter: Payer: Self-pay | Admitting: Infectious Disease

## 2022-09-05 VITALS — BP 133/77 | HR 64 | Resp 16 | Ht 65.0 in | Wt 237.0 lb

## 2022-09-05 DIAGNOSIS — M542 Cervicalgia: Secondary | ICD-10-CM | POA: Diagnosis not present

## 2022-09-05 DIAGNOSIS — I889 Nonspecific lymphadenitis, unspecified: Secondary | ICD-10-CM

## 2022-09-05 DIAGNOSIS — R509 Fever, unspecified: Secondary | ICD-10-CM

## 2022-09-05 DIAGNOSIS — I7300 Raynaud's syndrome without gangrene: Secondary | ICD-10-CM | POA: Diagnosis not present

## 2022-09-05 DIAGNOSIS — K7581 Nonalcoholic steatohepatitis (NASH): Secondary | ICD-10-CM

## 2022-09-05 DIAGNOSIS — L409 Psoriasis, unspecified: Secondary | ICD-10-CM

## 2022-09-05 DIAGNOSIS — C439 Malignant melanoma of skin, unspecified: Secondary | ICD-10-CM | POA: Diagnosis not present

## 2022-09-05 HISTORY — DX: Nonalcoholic steatohepatitis (NASH): K75.81

## 2022-09-05 NOTE — Progress Notes (Signed)
Subjective:  Chief complaint: Follow-up for "FUO"  Patient ID: [name], female    DOB: May 15, 1950, 72 y.o.   MRN: [id number]  HPI  72 year old Caucasian female history of psoriasis Elita Boone by imaging Raynaud's, chronic back pain status left postlaminectomy who  has been worked up extensively for FUO by former partner, Dr. Philis Pique have been largely low-grade and technically do not meet the criteria for FUO in the classical sense.  Regardless she has had an extensive workup including CT chest abdomen pelvis MRI of the lumbar sacral spine and sacroiliac joints.  The latter did not show clear cut infection. It did show a Small nonspecific fluid collection within the L2-3 laminectomy bed without suspicious enhancement.   Her infectious ease serological workup has been negative as well her sed rate and CRP were initially normal the CRP later was elevated ANA was positive at a titer of 1-80.  Further rheumatological labs with her primary which were negative so far.  She has an upcoming appoint with rheumatology in September  Dr. Earlene Plater had contemplated asking IR to aspirate the area in the laminectomy bed for culture.  She last saw Dr. Earlene Plater she has continued to have sweats during the day and also night sweats.  She also has had elevated temperature she was diagnosed with a dental abscess and was treated with amoxicillin and going for further dental intervention with removal of the tooth planned with some other reconstructive surgery.  She does have some lymph nodes in her neck that have been intermittently enlarged.  Back pain is a constant factor but not worse than when seen last by [place] in clinic.       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 Dulce Sellar per patient report.  Overview:  CAD risk score is low at 3%   Chondromalacia patellae 12/02/2012   Chronic back pain    Chronic kidney disease    Chronic knee pain    Chronic pain 09/22/2010   Multiple sources of pain: radicular pain from lumbar disease and post-operative pain; neck pain from cervical disk disease; left knee pain from trauma.  Plan - will wean off prednisone           Will continue percocet at 1  5/325 norco every 6 hours prn           NSAID - trial of meloxicam 60m g once a day           Increase gabapentin slowly to 600mg  tid and reassess           Executed a pain con   Chronic venous insufficiency 10/10/2013   Colitis 10/21/2016   Collagen vascular disease (HCC)    Deformity of joint 04/23/2013   Depression with anxiety 09/21/2010   Derangement of knee, left 2009   started with fall at work. Has had repair patella and torn meniscus   DJD (degenerative joint disease) of knee 09/02/2012   Essential hypertension 07/08/2010   Long standing hypertension  medically managed. Currently seeing Dr. Verdis Prime and Dr. Doy Mince ([facility]) for cardiology.  Meds  HCT only  Overview:  Overview:  Long standing hypertension medically managed. Currently seeing Dr. Verdis Prime and Dr. Dulce Sellar ([facility]) for cardiology.  Meds  HCT only  Last Assessment & Plan:  Formatting of this note may be different from the original. BP Readings   Fever chills 10/21/2016   Fibromyalgia 02/27/2017   High cholesterol    Hx of appendectomy    Hypertension    Hypertensive chronic kidney disease 07/08/2010   Long standing hypertension medically managed. Currently seeing Dr. Verdis Prime and Dr. Doy Mince ([facility]) for cardiology.  Meds  HCT only  Overview:   Overview:  Long standing hypertension medically managed. Currently seeing Dr. Verdis Prime and Dr. Dulce Sellar ([facility]) for cardiology.  Meds  HCT only  Last Assessment & Plan:  Formatting of this note may be different from the original. BP Readings   Knee internal derangement 07/08/2010   Left knee: Feb '09 medial and lateral meniscal tears by MRI                  April '09 arthroscopic surgery    Melanoma (HCC) 11/19/2015   Migraine headache    hormonal related - less frequent off hormone replacement   Migraines    Neuropathy 04/23/2013   PAT (paroxysmal atrial tachycardia) 02/17/2018   Peripheral cyanosis 09/28/2012   Full evaluation by Dr. Azzie Roup for rheumatology - no evidence of Raynaud's syndrome or disease; no evidence of underlying rheumatologic disease or malignancy. Most probable cause is neurogenic.    Pseudoarthrosis of lumbar spine 01/02/2011   Psoriasis 04/23/2013   Raynaud disease    Routine health maintenance 08/17/2011   Colonoscopy - no record in EPIC of colonoscopy  Immunizations - Tdap June '13.     Salmonella enteritis 10/23/2016   Scabies    Sepsis (HCC) 10/23/2016   Spinal stenosis 08/14/2010   history of cervical laminectomy, lumbar diskectomy with fixation devices and redo surgery.    Status post lumbar spinal fusion 10/02/2011   SVT (supraventricular tachycardia)    Synovial cyst of lumbar facet joint    lower back - s/p surgery    Tear of lateral meniscus of left knee 09/30/2012    Past Surgical History:  Procedure Laterality Date   APPENDECTOMY  1971   CERVICAL DISCECTOMY  1999   diskectomy with fixation:plate and screws   COLONOSCOPY     ESOPHAGOGASTRODUODENOSCOPY  12/2014   KNEE ARTHROSCOPY W/ MENISCAL REPAIR  09   left knee   L4-5 lumbar fusion  September 02, 2010   Dr. Gerlene Fee:  minimally invasive transforaminal interbody fusion with spacer   OVARIAN CYST SURGERY  1972   PATELLAR REEFING  '09   repair of fractured patella after fall   SPINE  SURGERY     SYNOVIAL CYST EXCISION  2007   lumbar spine   VENOUS ABLATION     for pain in the groin - VVTS did procedure. Has some residual discomfort at the  ablation site.    WISDOM TOOTH EXTRACTION      Family History  Problem Relation Age of Onset   Diabetes Mother    COPD Mother    Aortic aneurysm Mother    Hypertension Mother    Hypothyroidism Mother    Cancer Father        lung   Hyperlipidemia Father    Hypertension Father    Heart disease Father  Diabetes Father    Heart attack Father    Diabetes Brother       Social History   Socioeconomic History   Marital status: Married    Spouse name: Not on file   Number of children: Not on file   Years of education: 14   Highest education level: Not on file  Occupational History   Occupation: RECEP/ADMIN ASST.    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 Married '71- '10/widowed; engaged. 1 dtr- '72; 1 son '73; 4 grandchildren, 2 step-grands. 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   Right handed   Lives at home with spouse    2-3 cups daily of caffeine    Social Determinants of Health   Financial Resource Strain: Not on file  Food Insecurity: Not on file  Transportation Needs: Not on file  Physical Activity: Not on file  Stress: Not on file  Social Connections: Not on file    Allergies  Allergen Reactions   Epinephrine Palpitations    NASAL SPRAY Cardiac dysrhythmia     Nsaids Swelling    swelling     Other Swelling and Palpitations    Glucocorticoids/Steroids **ANTI INFLAMMATORY DRUGS**     Pregabalin Anaphylaxis   Tolmetin Swelling   Ciprofloxacin     Other reaction(s): nausea, Unknown   Parathyroid Hormone (Recomb)     Other reaction(s): nose bleeds, Unknown   Prednisone Swelling   Tape Rash and Other (See Comments)    *adhesive* Reaction- blisters  Rips her skin     Tapentadol Other (See Comments)    *adhesive* Reaction- blisters    Captopril    Lisinopril Swelling and Other (See Comments)    Dizziness     Celecoxib Palpitations    Cardiac dysrhythmia    Codeine Palpitations    Cardiac dysrhythmia     Lidocaine Hcl Palpitations   Procaine Palpitations and Other (See Comments)    dizziness    Sulfa Antibiotics Palpitations    Cardiac dysrhythmia    Sulfasalazine Palpitations     Current Outpatient Medications:    amitriptyline (ELAVIL) 10 MG tablet, Take 20 mg by mouth at bedtime. , Disp: , Rfl:    Apple Cid Vn-Grn Tea-Bit Or-Cr (APPLE CIDER VINEGAR PLUS) TABS, Take 1 tablet by mouth daily., Disp: , Rfl:    cholecalciferol (VITAMIN D) 1000 units tablet, Take 1,000 Units by mouth 2 (two) times daily. , Disp: , Rfl:    ciclopirox (PENLAC) 8 % solution, Apply topically at bedtime. Apply to nail/surrounding skin and daily over previous coat. Every 7 days remove with alcohol and continue., Disp: 6.6 mL, Rfl: 11   clonazePAM (KLONOPIN) 1 MG tablet, Take 1 tablet (1 mg total) by mouth at bedtime as needed for anxiety., Disp: 30 tablet, Rfl: 0   esomeprazole (NEXIUM) 40 MG capsule, Take 40 mg by mouth daily., Disp: , Rfl:    fluticasone (FLONASE) 50 MCG/ACT nasal spray, Place 1 spray into both nostrils daily as needed for allergies. , Disp: , Rfl:  furosemide (LASIX) 20 MG tablet, Daily for 1 week then weekly as needed for swelling., Disp: 31 tablet, Rfl: 3   gabapentin (NEURONTIN) 100 MG capsule, Take 100 mg by mouth daily. Take 3 tablets at night, Disp: , Rfl:    Hyoscyamine Sulfate SL (LEVSIN/SL) 0.125 MG SUBL, Place 0.125 mg under the tongue 4 (four)  times daily., Disp: 40 each, Rfl: 0   levothyroxine (SYNTHROID) 88 MCG tablet, Take 88 mcg by mouth at bedtime., Disp: , Rfl:    meloxicam (MOBIC) 15 MG tablet, Take 15 mg by mouth daily as needed for pain., Disp: , Rfl:    metoprolol succinate (TOPROL-XL) 25 MG 24 hr tablet, Take 1 tablet (25 mg total) by mouth 2 (two) times daily., Disp: 180 tablet, Rfl: 2   Multiple Vitamins-Minerals (MULTIVITAMIN ADULT PO), Take 1 tablet by mouth daily. , Disp: , Rfl:    spironolactone (ALDACTONE) 25 MG tablet, Take 1 tablet (25 mg total) by mouth daily as needed., Disp: 90 tablet, Rfl: 3   sucralfate (CARAFATE) 1 G tablet, Take 1 g by mouth daily., Disp: , Rfl:    tiZANidine (ZANAFLEX) 2 MG tablet, Take 2 mg by mouth 2 (two) times daily as needed. Reported on 04/05/2015, Disp: , Rfl:    Turmeric 500 MG CAPS, Take 1,000 mg by mouth daily., Disp: , Rfl:    zinc gluconate 50 MG tablet, Take 50 mg by mouth daily., Disp: , Rfl:    Review of Systems  Constitutional:  Positive for chills, diaphoresis, fatigue and fever. Negative for activity change, appetite change and unexpected weight change.  HENT:  Negative for congestion, rhinorrhea, sinus pressure, sneezing, sore throat and trouble swallowing.   Eyes:  Negative for photophobia and visual disturbance.  Respiratory:  Negative for cough, chest tightness, shortness of breath, wheezing and stridor.   Cardiovascular:  Negative for chest pain, palpitations and leg swelling.  Gastrointestinal:  Negative for abdominal distention, abdominal pain, anal bleeding, blood in stool, constipation, diarrhea, nausea and vomiting.  Genitourinary:  Negative for difficulty urinating, dysuria, flank pain and hematuria.  Musculoskeletal:  Positive for back pain. Negative for arthralgias, gait problem, joint swelling and myalgias.  Skin:  Negative for color change, pallor, rash and wound.  Neurological:  Negative for dizziness, tremors, weakness and light-headedness.   Hematological:  Negative for adenopathy. Does not bruise/bleed easily.  Psychiatric/Behavioral:  Negative for agitation, behavioral problems, confusion, decreased concentration, dysphoric mood and sleep disturbance.        Objective:   Physical Exam Constitutional:      General: She is not in acute distress.    Appearance: Normal appearance. She is well-developed. She is not ill-appearing or diaphoretic.  HENT:     Head: Normocephalic and atraumatic.     Right Ear: Hearing and external ear normal.     Left Ear: Hearing and external ear normal.     Nose: No nasal deformity or rhinorrhea.  Eyes:     General: No scleral icterus.    Conjunctiva/sclera: Conjunctivae normal.     Right eye: Right conjunctiva is not injected.     Left eye: Left conjunctiva is not injected.     Pupils: Pupils are equal, round, and reactive to light.  Neck:     Vascular: No JVD.  Cardiovascular:     Rate and Rhythm: Normal rate and regular rhythm.     Heart sounds: S1 normal and S2 normal.  Abdominal:     General: Bowel sounds are normal. There is  no distension.     Palpations: Abdomen is soft.     Tenderness: There is no abdominal tenderness.  Musculoskeletal:        General: Normal range of motion.     Right shoulder: Normal.     Left shoulder: Normal.     Cervical back: Normal range of motion and neck supple.     Right hip: Normal.     Left hip: Normal.     Right knee: Normal.     Left knee: Normal.  Lymphadenopathy:     Head:     Right side of head: Posterior auricular adenopathy present. No submandibular or preauricular adenopathy.     Left side of head: No submandibular, preauricular or posterior auricular adenopathy.     Cervical: Cervical adenopathy present.     Right cervical: Superficial cervical adenopathy present. No deep cervical adenopathy.    Left cervical: Superficial cervical adenopathy present. No deep cervical adenopathy.  Skin:    General: Skin is warm and dry.      Coloration: Skin is not pale.     Findings: No abrasion, bruising, ecchymosis, erythema, lesion or rash.     Nails: There is no clubbing.  Neurological:     General: No focal deficit present.     Mental Status: She is alert and oriented to person, place, and time.     Sensory: No sensory deficit.     Coordination: Coordination normal.     Gait: Gait normal.  Psychiatric:        Attention and Perception: She is attentive.        Mood and Affect: Mood normal.        Speech: Speech normal.        Behavior: Behavior normal. Behavior is cooperative.        Thought Content: Thought content normal.        Judgment: Judgment normal.           Assessment & Plan:   FUO:   I will check a BMP and we will check a CT neck given her focal lymphadenopathy:  Think she should can keep her appointment with rheumatology clearly.  We will contemplate repeat MRI of the lumbar spine to reassess the fluid collection was present.  Certainly most of her symptoms began postoperatively and a postoperative infection associated with her lumbar spine would be the most serious infection that I can think of that is possible with her that we could be missing.  Recent dental infection: I was hoping this might be I sent for her symptoms but apparently her symptoms do not improve with the treatment of her dental abscess with antibiotics.  I have personally spent 52 minutes involved in face-to-face and non-face-to-face activities for this patient on the day of the visit. Professional time spent includes the following activities: Preparing to see the patient (review of tests), Obtaining and/or reviewing separately obtained history (admission/discharge record), Performing a medically appropriate examination and/or evaluation , Ordering medications/tests/procedures, referring and communicating with other health care professionals, Documenting clinical information in the EMR, Independently interpreting results (not  separately reported), Communicating results to the patient/family/caregiver, Counseling and educating the patient/family/caregiver and Care coordination (not separately reported).

## 2022-09-06 LAB — BASIC METABOLIC PANEL WITH GFR
BUN: 18 mg/dL (ref 7–25)
CO2: 29 mmol/L (ref 20–32)
Calcium: 9.7 mg/dL (ref 8.6–10.4)
Chloride: 102 mmol/L (ref 98–110)
Creat: 0.92 mg/dL (ref 0.60–1.00)
Glucose, Bld: 113 mg/dL — ABNORMAL HIGH (ref 65–99)
Potassium: 5.3 mmol/L (ref 3.5–5.3)
Sodium: 139 mmol/L (ref 135–146)
eGFR: 67 mL/min/{1.73_m2} (ref >=60)

## 2022-09-13 ENCOUNTER — Other Ambulatory Visit: Payer: Self-pay | Admitting: Cardiology

## 2022-09-19 ENCOUNTER — Observation Stay (HOSPITAL_COMMUNITY): Payer: Medicare Other

## 2022-09-21 DIAGNOSIS — K5790 Diverticulosis of intestine, part unspecified, without perforation or abscess without bleeding: Secondary | ICD-10-CM | POA: Diagnosis not present

## 2022-09-21 DIAGNOSIS — Z1211 Encounter for screening for malignant neoplasm of colon: Secondary | ICD-10-CM | POA: Diagnosis not present

## 2022-09-21 DIAGNOSIS — K219 Gastro-esophageal reflux disease without esophagitis: Secondary | ICD-10-CM | POA: Diagnosis not present

## 2022-09-21 DIAGNOSIS — R1319 Other dysphagia: Secondary | ICD-10-CM | POA: Diagnosis not present

## 2022-10-04 ENCOUNTER — Other Ambulatory Visit: Payer: Self-pay | Admitting: Cardiology

## 2022-10-04 DIAGNOSIS — I129 Hypertensive chronic kidney disease with stage 1 through stage 4 chronic kidney disease, or unspecified chronic kidney disease: Secondary | ICD-10-CM

## 2022-10-05 ENCOUNTER — Ambulatory Visit (HOSPITAL_COMMUNITY)
Admission: RE | Admit: 2022-10-05 | Discharge: 2022-10-05 | Disposition: A | Discharge status: Home / Self Care | Payer: Medicare Other | Source: Ambulatory Visit | Attending: Infectious Disease | Admitting: Infectious Disease

## 2022-10-05 DIAGNOSIS — R221 Localized swelling, mass and lump, neck: Secondary | ICD-10-CM | POA: Diagnosis not present

## 2022-10-05 DIAGNOSIS — M542 Cervicalgia: Secondary | ICD-10-CM | POA: Diagnosis not present

## 2022-10-05 DIAGNOSIS — K7581 Nonalcoholic steatohepatitis (NASH): Secondary | ICD-10-CM | POA: Insufficient documentation

## 2022-10-05 DIAGNOSIS — R509 Fever, unspecified: Secondary | ICD-10-CM | POA: Insufficient documentation

## 2022-10-05 DIAGNOSIS — C439 Malignant melanoma of skin, unspecified: Secondary | ICD-10-CM | POA: Insufficient documentation

## 2022-10-05 DIAGNOSIS — I889 Nonspecific lymphadenitis, unspecified: Secondary | ICD-10-CM | POA: Insufficient documentation

## 2022-10-05 DIAGNOSIS — M4310 Spondylolisthesis, site unspecified: Secondary | ICD-10-CM | POA: Diagnosis not present

## 2022-10-05 DIAGNOSIS — L409 Psoriasis, unspecified: Secondary | ICD-10-CM | POA: Diagnosis not present

## 2022-10-05 DIAGNOSIS — I7300 Raynaud's syndrome without gangrene: Secondary | ICD-10-CM | POA: Insufficient documentation

## 2022-10-05 DIAGNOSIS — Z981 Arthrodesis status: Secondary | ICD-10-CM | POA: Diagnosis not present

## 2022-10-05 MED ORDER — IOHEXOL 300 MG/ML  SOLN
75.0000 mL | Freq: Once | INTRAMUSCULAR | Status: AC | PRN
  Administered 2022-10-05: 75 mL via INTRAVENOUS

## 2022-10-05 MED ORDER — SODIUM CHLORIDE (PF) 0.9 % IJ SOLN
INTRAMUSCULAR | Status: AC
  Filled 2022-10-05: qty 50

## 2022-10-07 ENCOUNTER — Encounter: Payer: Self-pay | Admitting: Infectious Disease

## 2022-10-09 NOTE — Progress Notes (Unsigned)
Cardiology Office Note:    Date:  10/10/2022   ID:  [name], DOB 15-May-1950, MRN [id number]  PCP:  Ollen Bowl, MD  Cardiologist:  Norman Herrlich, MD    Referring MD: Ollen Bowl, MD    ASSESSMENT:    1. PAT (paroxysmal atrial tachycardia)   2. Hypertensive kidney disease with stage 1 chronic kidney disease   3. Lymphedema of both lower extremities    PLAN:    In order of problems listed above:  The issue of cardiac amyloidosis has been raised I think the safest most effective course is do an echocardiogram looking at GLS.  She tells me she has had urine and blood studies performeD by her PCP. No recurrence of atrial tachycardia continue her beta-blocker Stable she takes diuretic therapy with her chronic lymphedema both furosemide and spironolactone.   Next appointment: 6 weeks   Medication Adjustments/Labs and Tests Ordered: Current medicines are reviewed at length with the patient today.  Concerns regarding medicines are outlined above.  Orders Placed This Encounter  Procedures   EKG 12-Lead   No orders of the defined types were placed in this encounter.    History of Present Illness:    [name] is a 72 y.o. female with a hx of paroxysmal atrial tachycardia suppressed with beta-blocker hypertensive kidney disease stage I CKD lymphedema and chronic venous insufficiency taking a distal diuretic spironolactone last seen 10/04/2021.  Her inflammatory marker CRP is elevated and with a combination of spine surgery and bilateral carpal tunnel surgery the issue of ATTR amyloidosis was raised that she is referred to cardiology for this abnormality.  She was recently seen by infectious diseases for fever of unknown origin 09/05/2022 the description is of fever has been largely low-grade and technically did not meet the criteria for fever unknown origin of the classical sense.    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 Dulce Sellar per patient report.  Overview:  CAD risk score is low at 3%   Chondromalacia patellae 12/02/2012   Chronic back pain    Chronic kidney disease    Chronic knee pain    Chronic pain 09/22/2010   Multiple sources of pain: radicular pain from lumbar disease and post-operative pain; neck pain from cervical disk disease; left knee pain from trauma.  Plan - will wean off prednisone           Will continue percocet at 1  5/325 norco every 6 hours prn           NSAID - trial of meloxicam 29m g once a day           Increase gabapentin slowly to 600mg  tid and reassess           Executed a pain con   Chronic venous insufficiency 10/10/2013  Colitis 10/21/2016   Collagen vascular disease (HCC)    Deformity of joint 04/23/2013   Depression with anxiety 09/21/2010   Derangement of knee, left 2009   started with fall at work. Has had repair patella and torn meniscus   DJD (degenerative joint disease) of knee 09/02/2012   Essential hypertension 07/08/2010   Long standing hypertension medically managed. Currently seeing Dr. Verdis Prime and Dr. Doy Mince ([facility]) for cardiology.  Meds  HCT only  Overview:  Overview:  Long standing hypertension medically managed. Currently seeing Dr. Verdis Prime and Dr. Dulce Sellar ([facility]) for cardiology.  Meds  HCT only   Last Assessment & Plan:  Formatting of this note may be different from the original. BP Readings   Fever chills 10/21/2016   Fibromyalgia 02/27/2017   High cholesterol    Hx of appendectomy    Hypertension    Hypertensive chronic kidney disease 07/08/2010   Long standing hypertension medically managed. Currently seeing Dr. Verdis Prime and Dr. Doy Mince ([facility]) for cardiology.  Meds  HCT only  Overview:  Overview:  Long standing hypertension medically managed. Currently seeing Dr. Verdis Prime and Dr. Dulce Sellar ([facility]) for cardiology.  Meds  HCT only  Last Assessment & Plan:  Formatting of this note may be different from the original. BP Readings   Knee internal derangement 07/08/2010   Left knee: Feb '09 medial and lateral meniscal tears by MRI                  April '09 arthroscopic surgery    Melanoma (HCC) 11/19/2015   Migraine headache    hormonal related - less frequent off hormone replacement   Migraines    NASH (nonalcoholic steatohepatitis) 09/05/2022   Neuropathy 04/23/2013   PAT (paroxysmal atrial tachycardia) 02/17/2018   Peripheral cyanosis 09/28/2012   Full evaluation by Dr. Azzie Roup for rheumatology - no evidence of Raynaud's syndrome or disease; no evidence of underlying rheumatologic disease or malignancy. Most probable cause is neurogenic.    Pseudoarthrosis of lumbar spine 01/02/2011   Psoriasis 04/23/2013   Raynaud disease    Routine health maintenance 08/17/2011   Colonoscopy - no record in EPIC of colonoscopy  Immunizations - Tdap June '13.     Salmonella enteritis 10/23/2016   Scabies    Sepsis (HCC) 10/23/2016   Spinal stenosis 08/14/2010   history of cervical laminectomy, lumbar diskectomy with fixation devices and redo surgery.    Status post lumbar spinal fusion 10/02/2011   SVT (supraventricular tachycardia)    Synovial cyst of lumbar facet joint    lower back - s/p surgery    Tear of lateral meniscus of left knee 09/30/2012    Current  Medications: Current Meds  Medication Sig   amitriptyline (ELAVIL) 10 MG tablet Take 20 mg by mouth at bedtime.    Apple Cid Vn-Grn Tea-Bit Or-Cr (APPLE CIDER VINEGAR PLUS) TABS Take 1 tablet by mouth daily.   cholecalciferol (VITAMIN D) 1000 units tablet Take 1,000 Units by mouth 2 (two) times daily.    ciclopirox (PENLAC) 8 % solution Apply topically at bedtime. Apply to nail/surrounding skin and daily over previous coat. Every 7 days remove with alcohol and continue.   clonazePAM (KLONOPIN) 1 MG tablet Take 1 tablet (1 mg total) by mouth at bedtime as needed for anxiety.   esomeprazole (NEXIUM) 40 MG capsule Take 40 mg by mouth daily.   fluticasone (FLONASE) 50 MCG/ACT nasal spray Place 1 spray into both nostrils daily as needed for allergies.  furosemide (LASIX) 20 MG tablet Take 20 mg by mouth every other day.   gabapentin (NEURONTIN) 100 MG capsule Take 100 mg by mouth daily. Take 3 tablets at night   Hyoscyamine Sulfate SL (LEVSIN/SL) 0.125 MG SUBL Place 0.125 mg under the tongue 4 (four) times daily.   levothyroxine (SYNTHROID) 88 MCG tablet Take 88 mcg by mouth at bedtime.   meloxicam (MOBIC) 15 MG tablet Take 15 mg by mouth daily as needed for pain.   metoprolol succinate (TOPROL-XL) 25 MG 24 hr tablet TAKE 1 TABLET BY MOUTH 2 TIMES DAILY.   Multiple Vitamins-Minerals (MULTIVITAMIN ADULT PO) Take 1 tablet by mouth daily.    spironolactone (ALDACTONE) 25 MG tablet Take 1 tablet (25 mg total) by mouth daily as needed.   sucralfate (CARAFATE) 1 G tablet Take 1 g by mouth daily.   tiZANidine (ZANAFLEX) 2 MG tablet Take 2 mg by mouth 2 (two) times daily as needed. Reported on 04/05/2015   Turmeric 500 MG CAPS Take 1,000 mg by mouth daily.   VITAMIN E PO Take 1 tablet by mouth daily.   zinc gluconate 50 MG tablet Take 50 mg by mouth daily.      EKGs/Labs/Other Studies Reviewed:    The following studies were reviewed today:  Cardiac Studies & Procedures        ECHOCARDIOGRAM  ECHOCARDIOGRAM COMPLETE 10/25/2018  Narrative ECHOCARDIOGRAM REPORT    Patient Name:   [facility] Date of Exam: 10/25/2018 Medical Rec #:  [id number]             Height:       65.0 in Accession #:    [id number]            Weight:       238.1 lb Date of Birth:  1950-05-28             BSA:          2.13 m Patient Age:    68 years              BP:           130/86 mmHg Patient Gender: F                     HR:           77 bpm. Exam Location:  [place]   Procedure: 2D Echo  Indications:    SOB  History:        Patient has prior history of Echocardiogram examinations, most recent 12/24/2017. SVT Signs/Symptoms: Chest Pain Risk Factors: Hypertension and Dyslipidemia.  Sonographer:    Sinda Du RDCS (AE) Referring Phys: [id number] Baldo Daub   Sonographer Comments: Technically challenging study due to limited acoustic windows. IMPRESSIONS   1. The left ventricle has normal systolic function with an ejection fraction of 60-65%. The cavity size was normal. There is moderately increased left ventricular wall thickness. Left ventricular diastolic Doppler parameters are consistent with impaired relaxation. 2. The right ventricle has normal systolic function. The cavity was normal. There is no increase in right ventricular wall thickness. 3. The aorta is normal unless otherwise noted.  FINDINGS Left Ventricle: The left ventricle has normal systolic function, with an ejection fraction of 60-65%. The cavity size was normal. There is moderately increased left ventricular wall thickness. Left ventricular diastolic Doppler parameters are consistent with impaired relaxation.  Right Ventricle: The right ventricle has normal systolic function. The cavity was normal. There is no  increase in right ventricular wall thickness.  Left Atrium: Left atrial size was normal in size.  Right Atrium: Right atrial size was normal in size. Right atrial pressure is  estimated at 3 mmHg.  Interatrial Septum: No atrial level shunt detected by color flow Doppler.  Pericardium: There is no evidence of pericardial effusion.  Mitral Valve: The mitral valve is normal in structure. Mitral valve regurgitation was not assessed by color flow Doppler.  Tricuspid Valve: The tricuspid valve is normal in structure. Tricuspid valve regurgitation is trivial by color flow Doppler.  Aortic Valve: The aortic valve is normal in structure. Aortic valve regurgitation was not assessed by color flow Doppler.  Pulmonic Valve: The pulmonic valve was normal in structure. Pulmonic valve regurgitation was not assessed by color flow Doppler.  Aorta: The aorta is normal unless otherwise noted.  Venous: The inferior vena cava measures 1.21 cm, is normal in size with greater than 50% respiratory variability.   +--------------+--------++ LEFT VENTRICLE         +----------------+----------++ +--------------+--------++ Diastology                 PLAX 2D                +----------------+----------++ +--------------+--------++ LV e' lateral:  12.60 cm/s LVIDd:        3.96 cm  +----------------+----------++ +--------------+--------++ LV E/e' lateral:6.0        LVIDs:        2.29 cm  +----------------+----------++ +--------------+--------++ LV e' medial:   7.51 cm/s  LV PW:        1.21 cm  +----------------+----------++ +--------------+--------++ LV E/e' medial: 10.1       LV IVS:       1.47 cm  +----------------+----------++ +--------------+--------++ LVOT diam:    1.50 cm  +--------------+--------++ LV SV:        50 ml    +--------------+--------++ LV SV Index:  22.13    +--------------+--------++ LVOT Area:    1.77 cm +--------------+--------++                        +--------------+--------++  +---------------+---------++ RIGHT VENTRICLE          +---------------+---------++ RV Basal diam: 2.45 cm    +---------------+---------++ RV S prime:    6.96 cm/s +---------------+---------++ TAPSE (M-mode):2.4 cm    +---------------+---------++  +-----------+-------++----------++ LEFT ATRIUM       Index      +-----------+-------++----------++ LA diam:   3.40 cm1.60 cm/m +-----------+-------++----------++ +------------+--------++----------++ RIGHT ATRIUM        Index      +------------+--------++----------++ RA Area:    9.51 cm           +------------+--------++----------++ RA Volume:  16.50 ml7.75 ml/m +------------+--------++----------++ +------------+-----------++ AORTIC VALVE            +------------+-----------++ LVOT Vmax:  81.80 cm/s  +------------+-----------++ LVOT Vmean: 64.000 cm/s +------------+-----------++ LVOT VTI:   0.175 m     +------------+-----------++  +-------------+-------++ AORTA                +-------------+-------++ Ao Root diam:2.70 cm +-------------+-------++ Ao Asc diam: 3.20 cm +-------------+-------++  +--------------+----------++  +---------------+-----------++ MITRAL VALVE              TRICUSPID VALVE            +--------------+----------++  +---------------+-----------++ MV Area (PHT):2.76 cm    TR Peak grad:  21.8 mmHg   +--------------+----------++  +---------------+-----------++ MV PHT:  79.75 msec  TR Vmax:       261.00 cm/s +--------------+----------++  +---------------+-----------++ MV Decel Time:275 msec   +--------------+----------++  +--------------+-------+ +--------------+-----------++ SHUNTS                MV E velocity:75.70 cm/s  +--------------+-------+ +--------------+-----------++ Systemic VTI: 0.18 m  MV A velocity:106.00 cm/s +--------------+-------+ +--------------+-----------++ Systemic Diam:1.50 cm MV E/A ratio: 0.71         +--------------+-------+ +--------------+-----------++  +---------+-------+ IVC              +---------+-------+ IVC diam:1.21 cm +---------+-------+   Gypsy Balsam MD Electronically signed by Gypsy Balsam MD Signature Date/Time: 10/30/2018/12:51:19 PM    Final    MONITORS  LONG TERM MONITOR (3-14 DAYS) 12/24/2017  Narrative A ZIO monitor was performed for 14 days to assess arrhythmia in a patient with previous SVT.  Baseline rhythm is sinus minimum average maximum heart rates are 5985 and 143 bpm.  Rare ventricular ectopy is seen with isolated PVCs and couplets less than 1%.  Supraventricular ectopy is seen with a burden of approximately 4.5%.  There are frequent APCs couplets of APCs and 338 brief runs of atrial premature contractions.  Longest episode 13-1/2 seconds rate of 185 the fastest 9 seconds rate of 207 and these are atrial tachycardia.  There are no episodes of atrial fibrillation or flutter.  Bradycardic events  There were triggered and 8 symptomatic events and the symptomatic events are associated with runs of atrial premature contractions.   Conclusion, significant supraventricular ectopy recurrent runs of APCs brief atrial tachycardia and symptomatic events correlating with atrial arrhythmia          EKG Interpretation Date/Time:  Tuesday October 10 2022 14:39:16 EDT Ventricular Rate:  67 PR Interval:  164 QRS Duration:  82 QT Interval:  388 QTC Calculation: 409 R Axis:   14  Text Interpretation: Normal sinus rhythm Normal ECG When compared with ECG of 23-May-2018 15:26, No significant change was found Confirmed by Norman Herrlich ([id number]) on 10/10/2022 2:47:07 PM   EKG Interpretation Date/Time:  Tuesday October 10 2022 14:39:16 EDT Ventricular Rate:  67 PR Interval:  164 QRS Duration:  82 QT Interval:  388 QTC Calculation: 409 R Axis:   14  Text Interpretation: Normal sinus rhythm Normal ECG When compared with ECG of 23-May-2018 15:26, No  significant change was found Confirmed by Norman Herrlich ([id number]) on 10/10/2022 2:47:07 PM   Recent Labs: 07/04/2022: ALT 50; Hemoglobin 14.6; Platelets 299 07/25/2022: TSH 3.17 09/05/2022: BUN 18; Creat 0.92; Potassium 5.3; Sodium 139  Recent Lipid Panel    Component Value Date/Time   CHOL 189 12/07/2011 1252   TRIG 127.0 12/07/2011 1252   HDL 49.30 12/07/2011 1252   CHOLHDL 4 12/07/2011 1252   VLDL 25.4 12/07/2011 1252   LDLCALC 114 (H) 12/07/2011 1252    Physical Exam:    VS:  BP 100/70 (BP Location: Right Arm, Patient Position: Sitting, Cuff Size: Normal)   Pulse 67   Ht 5\' 5"  (1.651 m)   Wt 234 lb (106.1 kg)   SpO2 94%   BMI 38.94 kg/m     Wt Readings from Last 3 Encounters:  10/10/22 234 lb (106.1 kg)  09/05/22 237 lb (107.5 kg)  07/25/22 238 lb (108 kg)     GEN:  Well nourished, well developed in no acute distress HEENT: Normal NECK: No JVD; No carotid bruits LYMPHATICS: No lymphadenopathy CARDIAC: RRR, no murmurs, rubs, gallops RESPIRATORY:  Clear to auscultation without rales, wheezing  or rhonchi  ABDOMEN: Soft, non-tender, non-distended MUSCULOSKELETAL:  No edema; No deformity  SKIN: Warm and dry NEUROLOGIC:  Alert and oriented x 3 PSYCHIATRIC:  Normal affect    Signed, Norman Herrlich, MD  10/10/2022 3:02 PM    [facility]

## 2022-10-10 ENCOUNTER — Ambulatory Visit: Payer: Medicare Other | Attending: Cardiology | Admitting: Cardiology

## 2022-10-10 ENCOUNTER — Encounter: Payer: Self-pay | Admitting: Cardiology

## 2022-10-10 VITALS — BP 100/70 | HR 67 | Ht 65.0 in | Wt 234.0 lb

## 2022-10-10 DIAGNOSIS — I4719 Other supraventricular tachycardia: Secondary | ICD-10-CM | POA: Diagnosis not present

## 2022-10-10 DIAGNOSIS — I129 Hypertensive chronic kidney disease with stage 1 through stage 4 chronic kidney disease, or unspecified chronic kidney disease: Secondary | ICD-10-CM | POA: Diagnosis not present

## 2022-10-10 DIAGNOSIS — N181 Chronic kidney disease, stage 1: Secondary | ICD-10-CM | POA: Diagnosis not present

## 2022-10-10 DIAGNOSIS — I890 Lymphedema, not elsewhere classified: Secondary | ICD-10-CM | POA: Diagnosis not present

## 2022-10-10 NOTE — Patient Instructions (Signed)
Medication Instructions:  Your physician recommends that you continue on your current medications as directed. Please refer to the Current Medication list given to you today.  *If you need a refill on your cardiac medications before your next appointment, please call your pharmacy*   Lab Work: None If you have labs (blood work) drawn today and your tests are completely normal, you will receive your results only by: [place] (if you have MyChart) OR A paper copy in the mail If you have any lab test that is abnormal or we need to change your treatment, we will call you to review the results.   Testing/Procedures: Your physician has requested that you have an echocardiogram. Echocardiography is a painless test that uses sound waves to create images of your heart. It provides your doctor with information about the size and shape of your heart and how well your heart's chambers and valves are working. This procedure takes approximately one hour. There are no restrictions for this procedure. Please do NOT wear cologne, perfume, aftershave, or lotions (deodorant is allowed). Please arrive 15 minutes prior to your appointment time.    Follow-Up: At [facility], you and your health needs are our priority.  As part of our continuing mission to provide you with exceptional heart care, we have created designated Provider Care Teams.  These Care Teams include your primary Cardiologist (physician) and Advanced Practice Providers (APPs -  Physician Assistants and Nurse Practitioners) who all work together to provide you with the care you need, when you need it.  We recommend signing up for the patient portal called "MyChart".  Sign up information is provided on this After Visit Summary.  MyChart is used to connect with patients for Virtual Visits (Telemedicine).  Patients are able to view lab/test results, encounter notes, upcoming appointments, etc.  Non-urgent messages can be sent to your  provider as well.   To learn more about what you can do with MyChart, go to [email or web address].    Your next appointment:   6 week(s)  Provider:   Shirlee More, MD    Other Instructions None

## 2022-10-12 ENCOUNTER — Encounter: Payer: Self-pay | Admitting: Cardiology

## 2022-10-12 ENCOUNTER — Other Ambulatory Visit: Payer: Self-pay

## 2022-10-12 DIAGNOSIS — I129 Hypertensive chronic kidney disease with stage 1 through stage 4 chronic kidney disease, or unspecified chronic kidney disease: Secondary | ICD-10-CM

## 2022-10-12 MED ORDER — METOPROLOL SUCCINATE ER 25 MG PO TB24
25.0000 mg | ORAL_TABLET | Freq: Two times a day (BID) | ORAL | 2 refills | Status: DC
Start: 2022-10-12 — End: 2023-04-13

## 2022-10-19 ENCOUNTER — Ambulatory Visit: Payer: Medicare Other | Admitting: Infectious Disease

## 2022-10-20 ENCOUNTER — Ambulatory Visit (HOSPITAL_COMMUNITY): Payer: Medicare Other

## 2022-10-24 ENCOUNTER — Ambulatory Visit (HOSPITAL_COMMUNITY): Payer: Medicare Other | Attending: Cardiology

## 2022-10-24 ENCOUNTER — Encounter (HOSPITAL_COMMUNITY): Payer: Self-pay | Admitting: Cardiology

## 2022-10-24 DIAGNOSIS — N760 Acute vaginitis: Secondary | ICD-10-CM | POA: Diagnosis not present

## 2022-10-24 DIAGNOSIS — I129 Hypertensive chronic kidney disease with stage 1 through stage 4 chronic kidney disease, or unspecified chronic kidney disease: Secondary | ICD-10-CM | POA: Diagnosis not present

## 2022-10-24 DIAGNOSIS — N181 Chronic kidney disease, stage 1: Secondary | ICD-10-CM | POA: Insufficient documentation

## 2022-10-24 DIAGNOSIS — I4719 Other supraventricular tachycardia: Secondary | ICD-10-CM | POA: Diagnosis not present

## 2022-10-24 DIAGNOSIS — R300 Dysuria: Secondary | ICD-10-CM | POA: Diagnosis not present

## 2022-10-24 DIAGNOSIS — I890 Lymphedema, not elsewhere classified: Secondary | ICD-10-CM | POA: Diagnosis not present

## 2022-10-24 LAB — ECHOCARDIOGRAM COMPLETE
Area-P 1/2: 3.34 cm2
S' Lateral: 2.7 cm

## 2022-10-25 DIAGNOSIS — D1801 Hemangioma of skin and subcutaneous tissue: Secondary | ICD-10-CM | POA: Diagnosis not present

## 2022-10-25 DIAGNOSIS — L858 Other specified epidermal thickening: Secondary | ICD-10-CM | POA: Diagnosis not present

## 2022-10-25 DIAGNOSIS — Z85820 Personal history of malignant melanoma of skin: Secondary | ICD-10-CM | POA: Diagnosis not present

## 2022-10-25 DIAGNOSIS — L821 Other seborrheic keratosis: Secondary | ICD-10-CM | POA: Diagnosis not present

## 2022-10-31 ENCOUNTER — Encounter: Payer: Self-pay | Admitting: Infectious Disease

## 2022-10-31 ENCOUNTER — Ambulatory Visit (INDEPENDENT_AMBULATORY_CARE_PROVIDER_SITE_OTHER): Payer: Medicare Other | Admitting: Infectious Disease

## 2022-10-31 ENCOUNTER — Other Ambulatory Visit: Payer: Self-pay

## 2022-10-31 VITALS — BP 125/76 | HR 65 | Temp 98.0°F | Wt 235.0 lb

## 2022-10-31 DIAGNOSIS — D509 Iron deficiency anemia, unspecified: Secondary | ICD-10-CM

## 2022-10-31 DIAGNOSIS — R509 Fever, unspecified: Secondary | ICD-10-CM | POA: Diagnosis not present

## 2022-10-31 NOTE — Progress Notes (Signed)
Subjective:  Chief complaint: Follow-up for subjective fevers and low-grade temperatures diaphoresis  Patient ID: [name], female    DOB: Jun 23, 1950, 72 y.o.   MRN: [id number]  HPI  72 year old Caucasian female history of psoriasis Elita Boone by imaging Raynaud's, chronic back pain status left postlaminectomy who  has been worked up extensively for FUO by former partner, Dr. Philis Pique have been largely low-grade and technically do not meet the criteria for FUO in the classical sense.  Regardless she has had an extensive workup including CT chest abdomen pelvis MRI of the lumbar sacral spine and sacroiliac joints.  The latter did not show clear cut infection. It did show a Small nonspecific fluid collection within the L2-3 laminectomy bed without suspicious enhancement.   Her infectious ease serological workup has been negative as well her sed rate and CRP were initially normal the CRP later was elevated ANA was positive at a titer of 1-80.  Further rheumatological labs with her primary which were negative so far.  She has an upcoming appoint with rheumatology in September  Dr. Earlene Plater had contemplated asking IR to aspirate the area in the laminectomy bed for culture.  She last saw Dr. Earlene Plater she has continued to have sweats during the day and also night sweats.  She also has had elevated temperature she was diagnosed with a dental abscess and was treated with amoxicillin and going for further dental intervention with removal of the tooth planned with some other reconstructive surgery.  She does have some lymph nodes in her neck that have been intermittently enlarged.  Back pain is a constant factor but not worse than when seen last by [place] in clinic.  We obtained a CT of the neck that was completely normal.  She still has upcoming appointment with rheumatology she requested repeat CRP.  She had echocardiogram done with cardiology due to concerns of potential amyloidosis though  understanding is that echocardiogram did not show evidence of this.        Past Medical History:  Diagnosis Date   Allergy    Anemia, iron deficiency 01/22/2011   May '12 - 13.8 Hgb, Nov 2, '12 Hgb 10.9    APC (atrial premature contractions) 02/17/2018   Arthritis    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   CAP (community acquired pneumonia) 10/21/2016   Chest pain in adult 07/08/2010   March 5, '09 negative cardiolite Sept 28, '10 negative nuclear stress (cardiolite) Dec 21, '11  Negative nuclear stress Celine Ahr) June 26, '14 Negative nuclear stress - [place] with Dr. Dulce Sellar per patient report.  Overview:  CAD risk score is low at 3%   Chondromalacia patellae 12/02/2012   Chronic back pain    Chronic kidney disease    Chronic knee pain    Chronic pain 09/22/2010   Multiple sources of pain: radicular pain from lumbar disease and post-operative pain; neck pain from cervical disk disease; left knee pain from trauma.  Plan - will wean off prednisone           Will continue percocet at 1  5/325 norco every 6 hours prn           NSAID - trial of meloxicam 9m g once a day           Increase gabapentin slowly to 600mg  tid and reassess           Executed a pain con   Chronic venous insufficiency 10/10/2013   Colitis 10/21/2016   Collagen vascular disease (  HCC)    Deformity of joint 04/23/2013   Depression with anxiety 09/21/2010   Derangement of knee, left 2009   started with fall at work. Has had repair patella and torn meniscus   DJD (degenerative joint disease) of knee 09/02/2012   Essential hypertension 07/08/2010   Long standing hypertension medically managed. Currently seeing Dr. Verdis Prime and Dr. Doy Mince ([facility]) for cardiology.  Meds  HCT only  Overview:  Overview:  Long standing hypertension medically managed. Currently seeing Dr. Verdis Prime and Dr. Dulce Sellar ([facility]) for cardiology.  Meds  HCT only  Last Assessment & Plan:  Formatting of this note may be different  from the original. BP Readings   Fever chills 10/21/2016   Fibromyalgia 02/27/2017   High cholesterol    Hx of appendectomy    Hypertension    Hypertensive chronic kidney disease 07/08/2010   Long standing hypertension medically managed. Currently seeing Dr. Verdis Prime and Dr. Doy Mince ([facility]) for cardiology.  Meds  HCT only  Overview:  Overview:  Long standing hypertension medically managed. Currently seeing Dr. Verdis Prime and Dr. Dulce Sellar ([facility]) for cardiology.  Meds  HCT only  Last Assessment & Plan:  Formatting of this note may be different from the original. BP Readings   Knee internal derangement 07/08/2010   Left knee: Feb '09 medial and lateral meniscal tears by MRI                  April '09 arthroscopic surgery    Melanoma (HCC) 11/19/2015   Migraine headache    hormonal related - less frequent off hormone replacement   Migraines    NASH (nonalcoholic steatohepatitis) 09/05/2022   Neuropathy 04/23/2013   PAT (paroxysmal atrial tachycardia) 02/17/2018   Peripheral cyanosis 09/28/2012   Full evaluation by Dr. Azzie Roup for rheumatology - no evidence of Raynaud's syndrome or disease; no evidence of underlying rheumatologic disease or malignancy. Most probable cause is neurogenic.    Pseudoarthrosis of lumbar spine 01/02/2011   Psoriasis 04/23/2013   Raynaud disease    Routine health maintenance 08/17/2011   Colonoscopy - no record in EPIC of colonoscopy  Immunizations - Tdap June '13.     Salmonella enteritis 10/23/2016   Scabies    Sepsis (HCC) 10/23/2016   Spinal stenosis 08/14/2010   history of cervical laminectomy, lumbar diskectomy with fixation devices and redo surgery.    Status post lumbar spinal fusion 10/02/2011   SVT (supraventricular tachycardia)    Synovial cyst of lumbar facet joint    lower back - s/p surgery    Tear of lateral meniscus of left knee 09/30/2012    Past Surgical History:  Procedure Laterality Date   APPENDECTOMY  1971    CERVICAL DISCECTOMY  1999   diskectomy with fixation:plate and screws   COLONOSCOPY     ESOPHAGOGASTRODUODENOSCOPY  12/2014   KNEE ARTHROSCOPY W/ MENISCAL REPAIR  09   left knee   L4-5 lumbar fusion  September 02, 2010   Dr. Gerlene Fee:  minimally invasive transforaminal interbody fusion with spacer   OVARIAN CYST SURGERY  1972   PATELLAR REEFING  '09   repair of fractured patella after fall   SPINE SURGERY     SYNOVIAL CYST EXCISION  2007   lumbar spine   VENOUS ABLATION     for pain in the groin - [facility]TS did procedure. Has some residual discomfort at the  ablation site.    WISDOM TOOTH EXTRACTION      Family History  Problem Relation Age of Onset   Diabetes Mother    COPD Mother    Aortic aneurysm Mother    Hypertension Mother    Hypothyroidism Mother    Cancer Father        lung   Hyperlipidemia Father    Hypertension Father    Heart disease Father    Diabetes Father    Heart attack Father    Diabetes Brother       Social History   Socioeconomic History   Marital status: Married    Spouse name: Not on file   Number of children: Not on file   Years of education: 14   Highest education level: Not on file  Occupational History   Occupation: RECEP/ADMIN ASST.    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 Married '71- '10/widowed; engaged. 1 dtr- '72; 1 son '73; 4 grandchildren, 2 step-grands. 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   Right handed   Lives at home with spouse    2-3 cups daily of caffeine    Social Determinants of Health   Financial Resource Strain: Not  on file  Food Insecurity: Not on file  Transportation Needs: Not on file  Physical Activity: Not on file  Stress: Not on file  Social Connections: Unknown (07/22/2021)   [facility]   Social Network    Social Network: Not on file    Allergies  Allergen Reactions   Epinephrine Palpitations    NASAL SPRAY Cardiac dysrhythmia     Nsaids Swelling    swelling     Other Swelling and Palpitations    Glucocorticoids/Steroids **ANTI INFLAMMATORY DRUGS**    Pregabalin Anaphylaxis   Tolmetin Swelling   Ciprofloxacin     Other reaction(s): nausea, Unknown   Parathyroid Hormone (Recomb)     Other reaction(s): nose bleeds, Unknown   Prednisone Swelling   Tape Rash and Other (See Comments)    *adhesive* Reaction- blisters  Rips her skin     Tapentadol Other (See Comments)    *adhesive* Reaction- blisters    Captopril    Lisinopril Swelling and Other (See Comments)    Dizziness     Celecoxib Palpitations    Cardiac dysrhythmia    Codeine Palpitations    Cardiac dysrhythmia     Lidocaine Hcl Palpitations   Procaine Palpitations and Other (See Comments)    dizziness    Sulfa Antibiotics Palpitations    Cardiac dysrhythmia    Sulfasalazine Palpitations     Current Outpatient Medications:    amitriptyline (ELAVIL) 10 MG tablet, Take 20 mg by mouth at bedtime. , Disp: , Rfl:    Apple Cid Vn-Grn Tea-Bit Or-Cr (APPLE CIDER VINEGAR PLUS) TABS, Take 1 tablet by mouth daily., Disp: , Rfl:    cholecalciferol (VITAMIN D) 1000 units tablet, Take 1,000 Units by mouth 2 (two) times daily. , Disp: , Rfl:    ciclopirox (PENLAC) 8 % solution, Apply topically at bedtime. Apply to nail/surrounding skin and daily over previous coat. Every 7 days  remove with alcohol and continue., Disp: 6.6 mL, Rfl: 11   clonazePAM (KLONOPIN) 1 MG tablet, Take 1 tablet (1 mg total) by mouth at bedtime as needed for anxiety., Disp: 30 tablet, Rfl: 0   esomeprazole (NEXIUM) 40 MG  capsule, Take 40 mg by mouth daily., Disp: , Rfl:    fluticasone (FLONASE) 50 MCG/ACT nasal spray, Place 1 spray into both nostrils daily as needed for allergies. , Disp: , Rfl:    furosemide (LASIX) 20 MG tablet, Take 20 mg by mouth every other day., Disp: , Rfl:    gabapentin (NEURONTIN) 100 MG capsule, Take 100 mg by mouth daily. Take 3 tablets at night, Disp: , Rfl:    Hyoscyamine Sulfate SL (LEVSIN/SL) 0.125 MG SUBL, Place 0.125 mg under the tongue 4 (four) times daily., Disp: 40 each, Rfl: 0   levothyroxine (SYNTHROID) 88 MCG tablet, Take 88 mcg by mouth at bedtime., Disp: , Rfl:    meloxicam (MOBIC) 15 MG tablet, Take 15 mg by mouth daily as needed for pain., Disp: , Rfl:    metoprolol succinate (TOPROL-XL) 25 MG 24 hr tablet, Take 1 tablet (25 mg total) by mouth 2 (two) times daily., Disp: 60 tablet, Rfl: 2   Multiple Vitamins-Minerals (MULTIVITAMIN ADULT PO), Take 1 tablet by mouth daily. , Disp: , Rfl:    spironolactone (ALDACTONE) 25 MG tablet, Take 1 tablet (25 mg total) by mouth daily as needed., Disp: 90 tablet, Rfl: 3   sucralfate (CARAFATE) 1 G tablet, Take 1 g by mouth daily., Disp: , Rfl:    tiZANidine (ZANAFLEX) 2 MG tablet, Take 2 mg by mouth 2 (two) times daily as needed. Reported on 04/05/2015, Disp: , Rfl:    Turmeric 500 MG CAPS, Take 1,000 mg by mouth daily., Disp: , Rfl:    VITAMIN E PO, Take 1 tablet by mouth daily., Disp: , Rfl:    zinc gluconate 50 MG tablet, Take 50 mg by mouth daily., Disp: , Rfl:    Review of Systems  Constitutional:  Positive for diaphoresis, fatigue and fever. Negative for activity change, appetite change, chills and unexpected weight change.  HENT:  Negative for congestion, rhinorrhea, sinus pressure, sneezing, sore throat and trouble swallowing.   Eyes:  Negative for photophobia and visual disturbance.  Respiratory:  Negative for cough, chest tightness, shortness of breath, wheezing and stridor.   Cardiovascular:  Negative for chest pain,  palpitations and leg swelling.  Gastrointestinal:  Negative for abdominal distention, abdominal pain, anal bleeding, blood in stool, constipation, diarrhea, nausea and vomiting.  Genitourinary:  Negative for difficulty urinating, dysuria, flank pain and hematuria.  Musculoskeletal:  Negative for arthralgias, back pain, gait problem, joint swelling and myalgias.  Skin:  Negative for color change, pallor, rash and wound.  Neurological:  Negative for dizziness, tremors, weakness and light-headedness.  Hematological:  Negative for adenopathy. Does not bruise/bleed easily.  Psychiatric/Behavioral:  Negative for agitation, behavioral problems, confusion, decreased concentration, dysphoric mood and sleep disturbance.        Objective:   Physical Exam Constitutional:      General: She is not in acute distress.    Appearance: Normal appearance. She is well-developed. She is not ill-appearing or diaphoretic.  HENT:     Head: Normocephalic and atraumatic.     Right Ear: Hearing and external ear normal.     Left Ear: Hearing and external ear normal.     Nose: No nasal deformity or rhinorrhea.  Eyes:     General: No scleral  icterus.    Conjunctiva/sclera: Conjunctivae normal.     Right eye: Right conjunctiva is not injected.     Left eye: Left conjunctiva is not injected.     Pupils: Pupils are equal, round, and reactive to light.  Neck:     Vascular: No JVD.  Cardiovascular:     Rate and Rhythm: Normal rate and regular rhythm.     Heart sounds: Normal heart sounds, S1 normal and S2 normal. No murmur heard.    No friction rub.  Abdominal:     General: Bowel sounds are normal. There is no distension.     Palpations: Abdomen is soft.     Tenderness: There is no abdominal tenderness.  Musculoskeletal:        General: Normal range of motion.     Right shoulder: Normal.     Left shoulder: Normal.     Cervical back: Normal range of motion and neck supple.     Right hip: Normal.     Left hip:  Normal.     Right knee: Normal.     Left knee: Normal.  Lymphadenopathy:     Head:     Right side of head: No submandibular, preauricular or posterior auricular adenopathy.     Left side of head: No submandibular, preauricular or posterior auricular adenopathy.     Cervical: No cervical adenopathy.     Right cervical: No superficial or deep cervical adenopathy.    Left cervical: No superficial or deep cervical adenopathy.  Skin:    General: Skin is warm and dry.     Coloration: Skin is not pale.     Findings: No abrasion, bruising, ecchymosis, erythema, lesion or rash.     Nails: There is no clubbing.  Neurological:     General: No focal deficit present.     Mental Status: She is alert and oriented to person, place, and time.     Sensory: No sensory deficit.     Coordination: Coordination normal.     Gait: Gait normal.  Psychiatric:        Attention and Perception: She is attentive.        Mood and Affect: Mood normal.        Speech: Speech normal.        Behavior: Behavior normal. Behavior is cooperative.        Thought Content: Thought content normal.        Judgment: Judgment normal.           Assessment & Plan:   "FUO"  She does have some [place] ancestry which could raise the possibility of hide Bernie in periodic fever.  I do not think she has many other features though to suggest this diagnosis and in my experience in the past sending the labs for hereditary fever syndromes has been exceedingly expensive and potentially cost prohibitive to the patient.  I will repeat a CRP per her request.   I am also going to check a ferritin to check for Stills disease   I cannot think of any other studies to undertake with her at this point in time 1 could consider a PET scan but I do not think her insurance will cover this.  She is going to be seen by rheumatology which I think is of paramount importance.  I have personally spent 26 minutes involved in face-to-face and  non-face-to-face activities for this patient on the day of the visit. Professional time spent includes the following activities:  Preparing to see the patient (review of tests), Obtaining and/or reviewing separately obtained history (admission/discharge record), Performing a medically appropriate examination and/or evaluation , Ordering medications/tests/procedures, referring and communicating with other health care professionals, Documenting clinical information in the EMR, Independently interpreting results (not separately reported), Communicating results to the patient/family/caregiver, Counseling and educating the patient/family/caregiver and Care coordination (not separately reported).

## 2022-11-01 LAB — C-REACTIVE PROTEIN: CRP: 9.1 mg/L — ABNORMAL HIGH (ref <8.0)

## 2022-11-01 LAB — FERRITIN: Ferritin: 53 ng/mL (ref 16–288)

## 2022-11-09 ENCOUNTER — Encounter: Payer: Self-pay | Admitting: Cardiology

## 2022-11-09 DIAGNOSIS — E7800 Pure hypercholesterolemia, unspecified: Secondary | ICD-10-CM

## 2022-11-15 ENCOUNTER — Ambulatory Visit (HOSPITAL_BASED_OUTPATIENT_CLINIC_OR_DEPARTMENT_OTHER)
Admission: RE | Admit: 2022-11-15 | Discharge: 2022-11-15 | Disposition: A | Discharge status: Home / Self Care | Payer: Medicare Other | Source: Ambulatory Visit | Attending: Cardiology | Admitting: Cardiology

## 2022-11-15 DIAGNOSIS — E7800 Pure hypercholesterolemia, unspecified: Secondary | ICD-10-CM | POA: Insufficient documentation

## 2022-11-17 DIAGNOSIS — M1711 Unilateral primary osteoarthritis, right knee: Secondary | ICD-10-CM | POA: Diagnosis not present

## 2022-11-17 DIAGNOSIS — M25561 Pain in right knee: Secondary | ICD-10-CM | POA: Diagnosis not present

## 2022-11-22 ENCOUNTER — Telehealth: Payer: Self-pay | Admitting: Cardiology

## 2022-11-22 NOTE — Telephone Encounter (Signed)
Patient is returning call in regards to CT results. Requesting return call.

## 2022-11-22 NOTE — Telephone Encounter (Signed)
Patient informed of results.  

## 2022-11-22 NOTE — Telephone Encounter (Signed)
Patient is returning phone call in regards to Ct results. Patient stated she has a meeting at 11AM this morning. Please advise.

## 2022-11-28 ENCOUNTER — Ambulatory Visit: Payer: Medicare Other | Admitting: Cardiology

## 2022-12-05 ENCOUNTER — Encounter: Payer: Medicare Other | Admitting: Internal Medicine

## 2022-12-05 NOTE — Progress Notes (Signed)
Office Visit Note  Patient: [name]             Date of Birth: 30-Dec-1950           MRN: [id number]             PCP: Ollen Bowl, MD Referring: Kathlynn Grate, DO Visit Date: 12/06/2022  Subjective:  New Patient (Initial Visit) (Patient states she is sweating, fevers, aches, and fatigue. Patient states her joints are changing. Patient states she has a lot of edema. Patient states she has to use compression clothing. )   History of Present Illness: [name] is a 72 y.o. female here for evaluation of positive ANA checked ni association with multiple symptoms but particularly recurring low grade fevers, sweats, and decreased exertion tolerance.  She is referred from infectious disease clinic after evaluation for possible fever of unknown origin but not strictly meeting criteria and infectious workup was unremarkable.  Was treated for dental abscess was also found to have small fluid collection at previous lumbar laminectomy site unclear if significant.  She has body aches in multiple areas does have chronic arthritis but pain overall has been worse during the past 1 year.  She has noticed some new and progressive deformities with nodules and lateral deviation in multiple fingers and toes. Also has increase in edema see some swelling in her hands in the morning swelling at the feet and ankles in the morning but more severe at the evening. Also has rashes and fingernail changes with ridges and discolored appearance.  She sees blue or dusky discoloration at the tips of the fingers this comes and goes.  Describes chronic dryness in her eyes and mouth and gets painful mouth sores intermittently.  Had some episodes with nosebleeds also saw some clots present in it. Has undergone extensive workup including CT of chest abdomen pelvis and MRI of the lumbar spine and SI joints.  Echocardiogram was unremarkable.  Persistent low-grade elevation in CRP and low positive ANA.  By  review also had ANA checked in 2015 at same titer no particular underlying disease identified at the time.  Labs reviewed 07/2022 ANA 1:80 DFS  05/2013 ANA 1:80 diffuse  Activities of Daily Living:  Patient reports morning stiffness for 1.5-2 hours.   Patient Reports nocturnal pain.  Difficulty dressing/grooming: Reports Difficulty climbing stairs: Reports Difficulty getting out of chair: Reports Difficulty using hands for taps, buttons, cutlery, and/or writing: Reports  Review of Systems  Constitutional:  Positive for fatigue.  HENT:  Positive for mouth sores and mouth dryness.   Eyes:  Positive for dryness.  Respiratory:  Positive for shortness of breath.   Cardiovascular:  Positive for chest pain and palpitations.  Gastrointestinal:  Negative for blood in stool, constipation and diarrhea.  Endocrine: Positive for increased urination.  Genitourinary:  Positive for involuntary urination.  Musculoskeletal:  Positive for joint pain, gait problem, joint pain, joint swelling, myalgias, muscle weakness, morning stiffness, muscle tenderness and myalgias.  Skin:  Positive for color change, rash and sensitivity to sunlight. Negative for hair loss.  Allergic/Immunologic: Positive for susceptible to infections.  Neurological:  Positive for dizziness and headaches.  Hematological:  Negative for swollen glands.  Psychiatric/Behavioral:  Positive for sleep disturbance. Negative for depressed mood. The patient is nervous/anxious.     PMFS History:  Patient Active Problem List   Diagnosis Date Noted   Positive ANA (antinuclear antibody) 12/06/2022   NASH (nonalcoholic steatohepatitis) 09/05/2022   Elevated LFTs 07/25/2022  Female stress incontinence 07/04/2022   Lesion of vulva 07/04/2022   High body temperature 07/04/2022   Cervical radiculitis 10/17/2021   Carpal tunnel syndrome on right 10/17/2021   Synovial cyst of lumbar facet joint    Raynaud disease    Migraines    Migraine  headache    Hypertension    Hx of appendectomy    High cholesterol    Collagen vascular disease (HCC)    Chronic knee pain    Chronic kidney disease    Allergy    Arthritis    Chronic right-sided low back pain with right-sided sciatica 12/12/2018   Left-sided trigeminal neuralgia 06/03/2018   Trigeminal neuralgia of left side of face 04/30/2018   APC (atrial premature contractions) 02/17/2018   PAT (paroxysmal atrial tachycardia) 02/17/2018   Palpitations 12/24/2017   Angular cheilitis 05/10/2017   Dysphonia 05/10/2017   Laryngopharyngeal reflux (LPR) 05/10/2017   Mucous retention cyst of maxillary sinus 05/10/2017   Recurrent epistaxis 05/10/2017   Upper airway cough syndrome 04/26/2017   Fibromyalgia 02/27/2017   Sepsis (HCC) 10/23/2016   Salmonella enteritis 10/23/2016   Colitis 10/21/2016   CAP (community acquired pneumonia) 10/21/2016   Fever with chills 10/21/2016   Melanoma (HCC) 11/19/2015   SVT (supraventricular tachycardia) 05/20/2015   Chronic venous insufficiency 10/10/2013   Neuropathy 04/23/2013   Psoriasis 04/23/2013   Deformity of joint 04/23/2013   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   Chondromalacia patellae 12/02/2012   Finger injury 11/08/2012   Tear of lateral meniscus of left knee 09/30/2012   Peripheral cyanosis 09/28/2012   DJD (degenerative joint disease) of knee 09/02/2012   Status post lumbar spinal fusion 10/02/2011   Routine health maintenance 08/17/2011   Anemia, iron deficiency 01/22/2011   Chronic back pain 01/02/2011   Pseudoarthrosis of lumbar spine 01/02/2011   Chronic pain 09/22/2010   Depression with anxiety 09/21/2010   Spinal stenosis 08/14/2010   Hypertensive chronic kidney disease 07/08/2010   Migraine 07/08/2010   Knee internal derangement 07/08/2010   Chest pain in adult 07/08/2010   Essential hypertension 07/08/2010   Derangement of knee, left 2009    Past Medical History:  Diagnosis Date   Allergy    Anemia,  iron deficiency 01/22/2011   May '12 - 13.8 Hgb, Nov 2, '12 Hgb 10.9    APC (atrial premature contractions) 02/17/2018   Arthritis    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 Dulce Sellar per patient report.  Overview:  CAD risk score is low at 3%   Chondromalacia patellae 12/02/2012   Chronic back pain    Chronic kidney disease    Chronic knee pain    Chronic pain 09/22/2010   Multiple sources of pain: radicular pain from lumbar disease and post-operative pain; neck pain from cervical disk disease; left knee pain from trauma.  Plan - will wean off prednisone           Will continue percocet at 1  5/325 norco every 6 hours prn           NSAID - trial of meloxicam 42m g once a day           Increase gabapentin slowly to 600mg  tid and reassess  Executed a pain con   Chronic venous insufficiency 10/10/2013   Colitis 10/21/2016   Collagen vascular disease (HCC)    Deformity of joint 04/23/2013   Depression with anxiety 09/21/2010   Derangement of knee, left 2009   started with fall at work. Has had repair patella and torn meniscus   DJD (degenerative joint disease) of knee 09/02/2012   Essential hypertension 07/08/2010   Long standing hypertension medically managed. Currently seeing Dr. Verdis Prime and Dr. Doy Mince ([facility]) for cardiology.  Meds  HCT only  Overview:  Overview:  Long standing hypertension medically managed. Currently seeing Dr. Verdis Prime and Dr. Dulce Sellar ([facility]) for cardiology.  Meds  HCT only  Last Assessment & Plan:  Formatting of this note may be different from the original. BP Readings   Fever chills 10/21/2016   Fibromyalgia 02/27/2017   High cholesterol    Hx of appendectomy    Hypertension     Hypertensive chronic kidney disease 07/08/2010   Long standing hypertension medically managed. Currently seeing Dr. Verdis Prime and Dr. Doy Mince ([facility]) for cardiology.  Meds  HCT only  Overview:  Overview:  Long standing hypertension medically managed. Currently seeing Dr. Verdis Prime and Dr. Dulce Sellar ([facility]) for cardiology.  Meds  HCT only  Last Assessment & Plan:  Formatting of this note may be different from the original. BP Readings   Hypothyroidism    Knee internal derangement 07/08/2010   Left knee: Feb '09 medial and lateral meniscal tears by MRI                  April '09 arthroscopic surgery    Melanoma (HCC) 11/19/2015   Migraine headache    hormonal related - less frequent off hormone replacement   Migraines    NASH (nonalcoholic steatohepatitis) 09/05/2022   Neuropathy 04/23/2013   PAT (paroxysmal atrial tachycardia) 02/17/2018   Peripheral cyanosis 09/28/2012   Full evaluation by Dr. Azzie Roup for rheumatology - no evidence of Raynaud's syndrome or disease; no evidence of underlying rheumatologic disease or malignancy. Most probable cause is neurogenic.    Pseudoarthrosis of lumbar spine 01/02/2011   Psoriasis 04/23/2013   Raynaud disease    Routine health maintenance 08/17/2011   Colonoscopy - no record in EPIC of colonoscopy  Immunizations - Tdap June '13.     Salmonella enteritis 10/23/2016   Scabies    Sepsis (HCC) 10/23/2016   Spinal stenosis 08/14/2010   history of cervical laminectomy, lumbar diskectomy with fixation devices and redo surgery.    Status post lumbar spinal fusion 10/02/2011   SVT (supraventricular tachycardia)    Synovial cyst of lumbar facet joint    lower back - s/p surgery    Tear of lateral meniscus of left knee 09/30/2012    Family History  Problem Relation Age of Onset   Diabetes Mother    COPD Mother    Aortic aneurysm Mother    Hypertension Mother    Hypothyroidism Mother    Cancer Father        lung   Hyperlipidemia  Father    Hypertension Father    Heart disease Father    Diabetes Father    Heart attack Father    Diabetes Brother    Past Surgical History:  Procedure Laterality Date   APPENDECTOMY  1971   CERVICAL DISCECTOMY  1999   diskectomy with fixation:plate and screws   COLONOSCOPY     ESOPHAGOGASTRODUODENOSCOPY  12/2014   KNEE ARTHROSCOPY W/ MENISCAL REPAIR  2009  left knee   L1-S1 Fusion     L4-5 lumbar fusion  09/02/2010   Dr. Gerlene Fee:  minimally invasive transforaminal interbody fusion with spacer   OVARIAN CYST SURGERY  1972   PATELLAR REEFING  '09   repair of fractured patella after fall   SPINE SURGERY     SYNOVIAL CYST EXCISION  2007   lumbar spine   VENOUS ABLATION     for pain in the groin - VVTS did procedure. Has some residual discomfort at the  ablation site.    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 Married '71- '10/widowed; engaged. 1 dtr- '72; 1 son '73; 4 grandchildren, 2 step-grands. 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   Right handed   Lives at home with spouse    2-3 cups daily of caffeine    Immunization History  Administered Date(s) Administered   Influenza Split 12/22/2010, 11/25/2014   Influenza, High Dose Seasonal PF 12/26/2017, 11/20/2018   Influenza,inj,Quad PF,6+ Mos 12/22/2012   Influenza-Unspecified 12/22/2010   PFIZER(Purple Top)SARS-COV-2 Vaccination 04/04/2019, 04/25/2019   Pneumococcal Conjugate-13 11/24/2014   Tdap 08/16/2011     Objective: Vital Signs: BP 135/82 (BP Location: Right Arm, Patient Position: Sitting, Cuff Size: Normal)   Pulse 80   Resp 14   Ht 5' 4.5" (1.638 m)   Wt 232 lb (105.2 kg)   BMI 39.21 kg/m    Physical Exam Constitutional:      Appearance: She is obese.  Eyes:     Conjunctiva/sclera: Conjunctivae normal.  Cardiovascular:     Rate and Rhythm: Normal  rate and regular rhythm.  Pulmonary:     Effort: Pulmonary effort is normal.     Breath sounds: Normal breath sounds.  Lymphadenopathy:     Cervical: No cervical adenopathy.  Skin:    General: Skin is warm and dry.     Findings: Rash present.     Comments: Nail changes with thickening discoloration most prominent in the distal portion of fingernails, no visible pitting Normal-appearing nailfold capillaroscopy There are some skin thickening on the plantar side at right heel, no erythema or plaques  Neurological:     Mental Status: She is alert.  Psychiatric:        Mood and Affect: Mood normal.      Musculoskeletal Exam:  Shoulders full ROM no tenderness or swelling Wrists full ROM no tenderness or swelling Fingers tenderness to pressure at first [facility] joint distal Heberden's nodes on both hands index fingers, no palpable swelling Knees with patellofemoral crepitus joint line tenderness to pressure, no palpable effusions MTPs with reducible cocked up toe deformities on both feet, mild tenderness to pressure on the plantar side along MTP joints but no pain with squeezing and no palpable swelling   Investigation: No additional findings.  Imaging: XR Foot 2 Views Left  Result Date: 12/08/2022 X-ray left foot 2 views Tibiotalar joint space and alignment appears normal.  Midfoot joints appear normal.  There is some increased medial deviation at the first metatarsal MTP joint appears normal.  Other MCPs some possible lateral bone spurring but joint spaces are preserved.  Cocked up toe position worst at the fourth digit. Impression Midfoot on the medial border and distal toes osteoarthritis  XR Foot 2 Views Right  Result Date: 12/08/2022 X-ray right foot 2 views Tibiotalar joint normal space and alignment.  Midfoot joints with small dorsal osteophytes no apparent loss of joint space or increase sclerosis.  Mild first MTP joint narrowing without significant osteophytes or deviation.  Other  MTPs well-preserved IP joints and cocked up position but no obvious large osteophytes. Impression Mild appearing osteoarthritis of midfoot and distal toe joints  XR Hand 2 View Right  Result Date: 12/08/2022 X-ray right hand 2 views Radiocarpal joint appears normal.  Mild degenerative change at first [facility] joint without significant subluxation.  There is some compensatory MCP changes with first IP extension and dorsal osteophyte.  Other MCP joints appear normal.  Lateral deviation at 2nd through 4th PIPs with lateral bone spurring.  DIP joint space loss with deviation and marginal osteophytes 2nd through 4th digits.  No erosions or other abnormal calcifications seen. Impression Mild first [facility] joint and severe distal joint osteoarthritis  XR Hand 2 View Left  Result Date: 12/08/2022 X-ray left hand 2 views Radiocarpal joint appears normal.  Mild degenerative changes at first [facility] joint with subluxation.  MCP joints appear normal.  PIP joint space loss with lateral deviation of the second digit.  Second and third DIPs with advanced joint space loss subluxation and lateral deviation.  No visible erosions no abnormal calcifications seen. Impression Osteoarthritis of first [facility] joint and advanced degeneration in distal second and third digits  CT CARDIAC SCORING  Addendum Date: 11/29/2022   ADDENDUM REPORT: 11/29/2022 14:26 EXAM: OVER-READ INTERPRETATION CT CHEST The following report is an over-read performed by radiologist Dr. Romona Curls of [facility] on 11/29/2022. This over-read does not include interpretation of cardiac or coronary anatomy or pathology. The coronary calcium score interpretation by the cardiologist is attached. COMPARISON:  Chest CT dated 06/16/2022. FINDINGS: Cardiovascular: Vascular calcifications are seen in the thoracic aorta. Normal heart size. No pericardial effusion. Mediastinum/Nodes: No enlarged mediastinal lymph nodes. The visible trachea and esophagus demonstrate no  significant findings. Lungs/Pleura: Mild bibasilar atelectasis/scarring. No pleural effusion. Upper Abdomen: The liver is hypoattenuating, suggestive of hepatic steatosis. Musculoskeletal: No chest wall mass or suspicious bone lesions identified. IMPRESSION: Findings suggestive of hepatic steatosis. Aortic Atherosclerosis (ICD10-I70.0). Electronically Signed   By: Romona Curls M.D.   On: 11/29/2022 14:26   Result Date: 11/29/2022 CLINICAL DATA:  Cardiovascular Disease Risk stratification EXAM: Coronary Calcium Score TECHNIQUE: A gated, non-contrast computed tomography scan of the heart was performed using 3mm slice thickness. Axial images were analyzed on a dedicated workstation. Calcium scoring of the coronary arteries was performed using the Agatston method. FINDINGS: Coronary arteries: Normal origins. Coronary Calcium Score: Left main: Left anterior descending artery: 3 Left circumflex artery: Right coronary artery: Total: 3 Percentile: 39th Pericardium: Normal. Ascending Aorta: Normal caliber. Non-cardiac: See separate report from [facility]. IMPRESSION: Coronary calcium score of 3. This was 39th percentile for age-, race-, and sex-matched controls. RECOMMENDATIONS: Coronary artery calcium (CAC) score is a strong predictor of incident coronary heart disease (CHD) and provides predictive information beyond traditional risk factors. CAC scoring is reasonable to use in the decision to withhold, postpone, or initiate statin therapy in intermediate-risk or selected borderline-risk asymptomatic adults (age 59-75 years and LDL-C >=70 to <190 mg/dL) who do not have diabetes or established atherosclerotic cardiovascular disease (ASCVD).* In intermediate-risk (10-year ASCVD risk >=7.5% to <20%) adults or selected borderline-risk (10-year ASCVD risk >=5% to <7.5%) adults in whom a CAC score is measured for the purpose of making a treatment decision the following recommendations have been made: If CAC=0, it is  reasonable to withhold statin therapy  and reassess in 5 to 10 years, as long as higher risk conditions are absent (diabetes mellitus, family history of premature CHD in first degree relatives (males <55 years; females <65 years), cigarette smoking, or LDL >=190 mg/dL). If CAC is 1 to 99, it is reasonable to initiate statin therapy for patients >=42 years of age. If CAC is >=100 or >=75th percentile, it is reasonable to initiate statin therapy at any age. Cardiology referral should be considered for patients with CAC scores >=400 or >=75th percentile. *2018 AHA/ACC/AACVPR/AAPA/ABC/ACPM/ADA/AGS/APhA/ASPC/NLA/PCNA Guideline on the Management of Blood Cholesterol: A Report of the American College of Cardiology/American Heart Association Task Force on Clinical Practice Guidelines. J Am Coll Cardiol. 2019;73(24):3168-3209. Epifanio Lesches, MD Electronically Signed: By: Epifanio Lesches M.D. On: 11/17/2022 13:36    Recent Labs: Lab Results  Component Value Date   WBC 6.8 07/04/2022   HGB 14.6 07/04/2022   PLT 299 07/04/2022   NA 139 09/05/2022   K 5.3 09/05/2022   CL 102 09/05/2022   CO2 29 09/05/2022   GLUCOSE 113 (H) 09/05/2022   BUN 18 09/05/2022   CREATININE 0.92 09/05/2022   BILITOT 0.4 07/04/2022   ALKPHOS 126 (H) 02/12/2022   AST 57 (H) 07/04/2022   ALT 50 (H) 07/04/2022   PROT 7.5 12/06/2022   ALBUMIN 4.4 02/12/2022   CALCIUM 9.7 09/05/2022   GFRAA 69 10/24/2018   QFTBGOLDPLUS NEGATIVE 07/25/2022    Speciality Comments: No specialty comments available.  Procedures:  No procedures performed Allergies: Epinephrine, Nsaids, Other, Pregabalin, Tolmetin, Ciprofloxacin, Parathyroid hormone (recomb), Prednisone, Tape, Tapentadol, Captopril, Lisinopril, Celecoxib, Codeine, Lidocaine hcl, Procaine, Sulfa antibiotics, and Sulfasalazine   Assessment / Plan:     Visit Diagnoses: Positive ANA (antinuclear antibody) - Plan: C3 and C4, Sedimentation rate, C-reactive protein, Rheumatoid  factor, Cyclic citrul peptide antibody, IgG, Serum protein electrophoresis with reflex, IgG, IgA, IgM  Positive ANA but at low titer and with negative reflex panel will check additional serologies for RA markers, protein electrophoresis, cognitive immunoglobulins, and complements for any evidence of systemic immune activation or other underlying cause.  Lower suspicion for any particular autoimmune connective tissue disease could consider trial of DMARD such as hydroxychloroquine if abnormal possibly undifferentiated and disease process.  Recurrent epistaxis  Reviewed x-rays of previous nosebleeds possibly related to chronic dryness does not have any evidence of unusual bruising or blood loss elsewhere.  Fibromyalgia  Chronic midline low back pain, unspecified whether sciatica present Spinal stenosis, unspecified spinal region  Chronic back pain with known multilevel degenerative disease and previous surgical fixation.  There was some question about evaluating fluid collection noted on lumbar imaging.  On long-term treatment with meloxicam 15 mg daily.  Raynaud's disease without gangrene Peripheral cyanosis  Finger discoloration sounds more consistent with peripheral cyanosis versus true Raynaud's disease as it is not provoked unusual circumstance of cold without multiphasic change in slightly unusual distribution.  Was previous expected to be a nerve mediated process at evaluation from years ago.  Fever with chills  Bilateral hand pain - Plan: XR Hand 2 View Right, XR Hand 2 View Left  Bilateral hand x-rays checked for complaint of worsening deformities and nodules.  Advanced degenerative changes involving the first 3 digits look consistent with primary osteoarthritis.  Bilateral foot pain - Plan: XR Foot 2 Views Right, XR Foot 2 Views Left  X-rays of bilateral feet for complaint about worsening toe changes.  There are cocked up toe deformities but reducible and with no appreciable  synovitis.  X-ray  shows relatively mild degenerative changes localized to the distal foot.   Orders: Orders Placed This Encounter  Procedures   XR Hand 2 View Right   XR Hand 2 View Left   XR Foot 2 Views Right   XR Foot 2 Views Left   C3 and C4   Sedimentation rate   C-reactive protein   Rheumatoid factor   Cyclic citrul peptide antibody, IgG   Serum protein electrophoresis with reflex   IgG, IgA, IgM   No orders of the defined types were placed in this encounter.    Follow-Up Instructions: Return in about 4 weeks (around 01/03/2023) for New pt ?FUO/?PsA/OA/FMS f/u 62mo.   Fuller Plan, MD  Note - This record has been created using [employer].  Chart creation errors have been sought, but may not always  have been located. Such creation errors do not reflect on  the standard of medical care.

## 2022-12-06 ENCOUNTER — Ambulatory Visit: Payer: Medicare Other

## 2022-12-06 ENCOUNTER — Ambulatory Visit: Payer: Medicare Other | Attending: Internal Medicine | Admitting: Internal Medicine

## 2022-12-06 ENCOUNTER — Encounter: Payer: Self-pay | Admitting: Internal Medicine

## 2022-12-06 VITALS — BP 135/82 | HR 80 | Resp 14 | Ht 64.5 in | Wt 232.0 lb

## 2022-12-06 DIAGNOSIS — G8929 Other chronic pain: Secondary | ICD-10-CM | POA: Diagnosis not present

## 2022-12-06 DIAGNOSIS — M797 Fibromyalgia: Secondary | ICD-10-CM | POA: Insufficient documentation

## 2022-12-06 DIAGNOSIS — M79671 Pain in right foot: Secondary | ICD-10-CM

## 2022-12-06 DIAGNOSIS — R040 Epistaxis: Secondary | ICD-10-CM | POA: Insufficient documentation

## 2022-12-06 DIAGNOSIS — R509 Fever, unspecified: Secondary | ICD-10-CM | POA: Diagnosis not present

## 2022-12-06 DIAGNOSIS — M79641 Pain in right hand: Secondary | ICD-10-CM

## 2022-12-06 DIAGNOSIS — M4800 Spinal stenosis, site unspecified: Secondary | ICD-10-CM | POA: Insufficient documentation

## 2022-12-06 DIAGNOSIS — M79642 Pain in left hand: Secondary | ICD-10-CM | POA: Insufficient documentation

## 2022-12-06 DIAGNOSIS — M79672 Pain in left foot: Secondary | ICD-10-CM | POA: Diagnosis not present

## 2022-12-06 DIAGNOSIS — R768 Other specified abnormal immunological findings in serum: Secondary | ICD-10-CM | POA: Diagnosis not present

## 2022-12-06 DIAGNOSIS — R230 Cyanosis: Secondary | ICD-10-CM | POA: Insufficient documentation

## 2022-12-06 DIAGNOSIS — M545 Low back pain, unspecified: Secondary | ICD-10-CM | POA: Insufficient documentation

## 2022-12-06 DIAGNOSIS — I7300 Raynaud's syndrome without gangrene: Secondary | ICD-10-CM | POA: Insufficient documentation

## 2022-12-07 LAB — IGG, IGA, IGM
IgG (Immunoglobin G), Serum: 1336 mg/dL (ref 600–1540)
IgM, Serum: 214 mg/dL (ref 50–300)
Immunoglobulin A: 251 mg/dL (ref 70–320)

## 2022-12-07 LAB — SEDIMENTATION RATE: Sed Rate: 25 mm/h (ref 0–30)

## 2022-12-07 LAB — RHEUMATOID FACTOR: Rheumatoid fact SerPl-aCnc: <10 IU/mL (ref <14)

## 2022-12-07 LAB — C-REACTIVE PROTEIN: CRP: 13.9 mg/L — ABNORMAL HIGH (ref <8.0)

## 2022-12-07 LAB — C3 AND C4
C3 Complement: 195 mg/dL — ABNORMAL HIGH (ref 83–193)
C4 Complement: 22 mg/dL (ref 15–57)

## 2022-12-09 LAB — PROTEIN ELECTROPHORESIS, SERUM, WITH REFLEX
Albumin ELP: 4.3 g/dL (ref 3.8–4.8)
Alpha 1: 0.3 g/dL (ref 0.2–0.3)
Alpha 2: 0.7 g/dL (ref 0.5–0.9)
Beta 2: 0.5 g/dL (ref 0.2–0.5)
Beta Globulin: 0.5 g/dL (ref 0.4–0.6)
Gamma Globulin: 1.2 g/dL (ref 0.8–1.7)
Total Protein: 7.5 g/dL (ref 6.1–8.1)

## 2022-12-09 LAB — CYCLIC CITRUL PEPTIDE ANTIBODY, IGG: Cyclic Citrullin Peptide Ab: <16 U

## 2022-12-12 ENCOUNTER — Encounter: Payer: Self-pay | Admitting: Internal Medicine

## 2022-12-13 DIAGNOSIS — M1712 Unilateral primary osteoarthritis, left knee: Secondary | ICD-10-CM | POA: Diagnosis not present

## 2022-12-13 DIAGNOSIS — M25562 Pain in left knee: Secondary | ICD-10-CM | POA: Diagnosis not present

## 2022-12-13 NOTE — Telephone Encounter (Signed)
Patient called the office requesting her lab results. Please review and advise. Thanks!

## 2022-12-20 DIAGNOSIS — Z1231 Encounter for screening mammogram for malignant neoplasm of breast: Secondary | ICD-10-CM | POA: Diagnosis not present

## 2023-01-01 DIAGNOSIS — D123 Benign neoplasm of transverse colon: Secondary | ICD-10-CM | POA: Diagnosis not present

## 2023-01-01 DIAGNOSIS — K5730 Diverticulosis of large intestine without perforation or abscess without bleeding: Secondary | ICD-10-CM | POA: Diagnosis not present

## 2023-01-01 DIAGNOSIS — Z1211 Encounter for screening for malignant neoplasm of colon: Secondary | ICD-10-CM | POA: Diagnosis not present

## 2023-01-03 DIAGNOSIS — N6311 Unspecified lump in the right breast, upper outer quadrant: Secondary | ICD-10-CM | POA: Diagnosis not present

## 2023-01-03 DIAGNOSIS — D123 Benign neoplasm of transverse colon: Secondary | ICD-10-CM | POA: Diagnosis not present

## 2023-01-03 DIAGNOSIS — R922 Inconclusive mammogram: Secondary | ICD-10-CM | POA: Diagnosis not present

## 2023-01-07 NOTE — Progress Notes (Unsigned)
Office Visit Note  Patient: [name]             Date of Birth: 01/16/1951           MRN: [id number]             PCP: Ollen Bowl, MD Referring: Ollen Bowl, MD Visit Date: 01/08/2023   Subjective:  No chief complaint on file.   History of Present Illness: [name] is a 72 y.o. female here for follow up ***   Previous HPI 12/06/22 [name] is a 72 y.o. female here for evaluation of positive ANA checked ni association with multiple symptoms but particularly recurring low grade fevers, sweats, and decreased exertion tolerance.  She is referred from infectious disease clinic after evaluation for possible fever of unknown origin but not strictly meeting criteria and infectious workup was unremarkable.  Was treated for dental abscess was also found to have small fluid collection at previous lumbar laminectomy site unclear if significant.  She has body aches in multiple areas does have chronic arthritis but pain overall has been worse during the past 1 year.  She has noticed some new and progressive deformities with nodules and lateral deviation in multiple fingers and toes. Also has increase in edema see some swelling in her hands in the morning swelling at the feet and ankles in the morning but more severe at the evening. Also has rashes and fingernail changes with ridges and discolored appearance.  She sees blue or dusky discoloration at the tips of the fingers this comes and goes.  Describes chronic dryness in her eyes and mouth and gets painful mouth sores intermittently.  Had some episodes with nosebleeds also saw some clots present in it. Has undergone extensive workup including CT of chest abdomen pelvis and MRI of the lumbar spine and SI joints.  Echocardiogram was unremarkable.  Persistent low-grade elevation in CRP and low positive ANA.  By review also had ANA checked in 2015 at same titer no particular underlying disease identified at the  time.   Labs reviewed 07/2022 ANA 1:80 DFS   05/2013 ANA 1:80 diffuse   No Rheumatology ROS completed.   PMFS History:  Patient Active Problem List   Diagnosis Date Noted   Positive ANA (antinuclear antibody) 12/06/2022   NASH (nonalcoholic steatohepatitis) 09/05/2022   Elevated LFTs 07/25/2022   Female stress incontinence 07/04/2022   Lesion of vulva 07/04/2022   High body temperature 07/04/2022   Cervical radiculitis 10/17/2021   Carpal tunnel syndrome on right 10/17/2021   Synovial cyst of lumbar facet joint    Raynaud disease    Migraines    Migraine headache    Hypertension    Hx of appendectomy    High cholesterol    Collagen vascular disease (HCC)    Chronic knee pain    Chronic kidney disease    Allergy    Arthritis    Chronic right-sided low back pain with right-sided sciatica 12/12/2018   Left-sided trigeminal neuralgia 06/03/2018   Trigeminal neuralgia of left side of face 04/30/2018   APC (atrial premature contractions) 02/17/2018   PAT (paroxysmal atrial tachycardia) (HCC) 02/17/2018   Palpitations 12/24/2017   Angular cheilitis 05/10/2017   Dysphonia 05/10/2017   Laryngopharyngeal reflux (LPR) 05/10/2017   Mucous retention cyst of maxillary sinus 05/10/2017   Recurrent epistaxis 05/10/2017   Upper airway cough syndrome 04/26/2017   Fibromyalgia 02/27/2017   Sepsis (HCC) 10/23/2016   Salmonella enteritis 10/23/2016  Colitis 10/21/2016   CAP (community acquired pneumonia) 10/21/2016   Fever with chills 10/21/2016   Melanoma (HCC) 11/19/2015   SVT (supraventricular tachycardia) (HCC) 05/20/2015   Chronic venous insufficiency 10/10/2013   Neuropathy 04/23/2013   Psoriasis 04/23/2013   Deformity of joint 04/23/2013   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   Chondromalacia patellae 12/02/2012   Finger injury 11/08/2012   Tear of lateral meniscus of left knee 09/30/2012   Peripheral cyanosis 09/28/2012   DJD (degenerative joint disease) of  knee 09/02/2012   Status post lumbar spinal fusion 10/02/2011   Routine health maintenance 08/17/2011   Anemia, iron deficiency 01/22/2011   Chronic back pain 01/02/2011   Pseudoarthrosis of lumbar spine 01/02/2011   Chronic pain 09/22/2010   Depression with anxiety 09/21/2010   Spinal stenosis 08/14/2010   Hypertensive chronic kidney disease 07/08/2010   Migraine 07/08/2010   Knee internal derangement 07/08/2010   Chest pain in adult 07/08/2010   Essential hypertension 07/08/2010   Derangement of knee, left 2009    Past Medical History:  Diagnosis Date   Allergy    Anemia, iron deficiency 01/22/2011   May '12 - 13.8 Hgb, Nov 2, '12 Hgb 10.9    APC (atrial premature contractions) 02/17/2018   Arthritis    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 Dulce Sellar per patient report.  Overview:  CAD risk score is low at 3%   Chondromalacia patellae 12/02/2012   Chronic back pain    Chronic kidney disease    Chronic knee pain    Chronic pain 09/22/2010   Multiple sources of pain: radicular pain from lumbar disease and post-operative pain; neck pain from cervical disk disease; left knee pain from trauma.  Plan - will wean off prednisone           Will continue percocet at 1  5/325 norco every 6 hours prn           NSAID - trial of meloxicam 53m g once a day           Increase gabapentin slowly to 600mg  tid and reassess           Executed a pain con   Chronic venous insufficiency 10/10/2013   Colitis 10/21/2016   Collagen vascular disease (HCC)    Deformity of joint 04/23/2013   Depression with anxiety 09/21/2010   Derangement of knee, left 2009   started with fall at work. Has had repair patella and torn meniscus   DJD  (degenerative joint disease) of knee 09/02/2012   Essential hypertension 07/08/2010   Long standing hypertension medically managed. Currently seeing Dr. Verdis Prime and Dr. Doy Mince ([facility]) for cardiology.  Meds  HCT only  Overview:  Overview:  Long standing hypertension medically managed. Currently seeing Dr. Verdis Prime and Dr. Dulce Sellar ([facility]) for cardiology.  Meds  HCT only  Last Assessment & Plan:  Formatting of this note may be different from the original. BP Readings   Fever chills 10/21/2016   Fibromyalgia 02/27/2017   High cholesterol    Hx of appendectomy    Hypertension    Hypertensive chronic kidney disease 07/08/2010   Long standing hypertension medically managed. Currently seeing Dr. Verdis Prime and Dr. Doy Mince ([facility]) for cardiology.  Meds  HCT only  Overview:  Overview:  Long standing hypertension medically managed. Currently seeing Dr. Verdis Prime and Dr. Dulce Sellar ([facility]) for cardiology.  Meds  HCT only  Last Assessment & Plan:  Formatting of this note may be different from the original. BP Readings   Hypothyroidism    Knee internal derangement 07/08/2010   Left knee: Feb '09 medial and lateral meniscal tears by MRI                  April '09 arthroscopic surgery    Melanoma (HCC) 11/19/2015   Migraine headache    hormonal related - less frequent off hormone replacement   Migraines    NASH (nonalcoholic steatohepatitis) 09/05/2022   Neuropathy 04/23/2013   PAT (paroxysmal atrial tachycardia) 02/17/2018   Peripheral cyanosis 09/28/2012   Full evaluation by Dr. Azzie Roup for rheumatology - no evidence of Raynaud's syndrome or disease; no evidence of underlying rheumatologic disease or malignancy. Most probable cause is neurogenic.    Pseudoarthrosis of lumbar spine 01/02/2011   Psoriasis 04/23/2013   Raynaud disease    Routine health maintenance 08/17/2011   Colonoscopy - no record in EPIC of colonoscopy  Immunizations - Tdap June '13.     Salmonella  enteritis 10/23/2016   Scabies    Sepsis (HCC) 10/23/2016   Spinal stenosis 08/14/2010   history of cervical laminectomy, lumbar diskectomy with fixation devices and redo surgery.    Status post lumbar spinal fusion 10/02/2011   SVT (supraventricular tachycardia)    Synovial cyst of lumbar facet joint    lower back - s/p surgery    Tear of lateral meniscus of left knee 09/30/2012    Family History  Problem Relation Age of Onset   Diabetes Mother    COPD Mother    Aortic aneurysm Mother    Hypertension Mother    Hypothyroidism Mother    Cancer Father        lung   Hyperlipidemia Father    Hypertension Father    Heart disease Father    Diabetes Father    Heart attack Father    Diabetes Brother    Past Surgical History:  Procedure Laterality Date   APPENDECTOMY  1971   CERVICAL DISCECTOMY  1999   diskectomy with fixation:plate and screws   COLONOSCOPY     ESOPHAGOGASTRODUODENOSCOPY  12/2014   KNEE ARTHROSCOPY W/ MENISCAL REPAIR  2009   left knee   L1-S1 Fusion     L4-5 lumbar fusion  09/02/2010   Dr. Gerlene Fee:  minimally invasive transforaminal interbody fusion with spacer   OVARIAN CYST SURGERY  1972   PATELLAR REEFING  '09   repair of fractured patella after fall   SPINE SURGERY     SYNOVIAL CYST EXCISION  2007   lumbar spine   VENOUS ABLATION     for pain in the groin - [facility] did procedure. Has some residual discomfort at the  ablation site.    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 Married '71- '10/widowed; engaged. 1 dtr- '72; 1 son '73; 4 grandchildren, 2 step-grands. 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   Right handed   Lives at home with spouse    2-3 cups daily of caffeine    Immunization History  Administered Date(s) Administered   Influenza Split 12/22/2010, 11/25/2014   Influenza, High Dose Seasonal  PF  12/26/2017, 11/20/2018   Influenza,inj,Quad PF,6+ Mos 12/22/2012   Influenza-Unspecified 12/22/2010   PFIZER(Purple Top)SARS-COV-2 Vaccination 04/04/2019, 04/25/2019   Pneumococcal Conjugate-13 11/24/2014   Tdap 08/16/2011     Objective: Vital Signs: There were no vitals taken for this visit.   Physical Exam   Musculoskeletal Exam: ***  CDAI Exam: CDAI Score: -- Patient Global: --; Provider Global: -- Swollen: --; Tender: -- Joint Exam 01/08/2023   No joint exam has been documented for this visit   There is currently no information documented on the homunculus. Go to the Rheumatology activity and complete the homunculus joint exam.  Investigation: No additional findings.  Imaging: No results found.  Recent Labs: Lab Results  Component Value Date   WBC 6.8 07/04/2022   HGB 14.6 07/04/2022   PLT 299 07/04/2022   NA 139 09/05/2022   K 5.3 09/05/2022   CL 102 09/05/2022   CO2 29 09/05/2022   GLUCOSE 113 (H) 09/05/2022   BUN 18 09/05/2022   CREATININE 0.92 09/05/2022   BILITOT 0.4 07/04/2022   ALKPHOS 126 (H) 02/12/2022   AST 57 (H) 07/04/2022   ALT 50 (H) 07/04/2022   PROT 7.5 12/06/2022   ALBUMIN 4.4 02/12/2022   CALCIUM 9.7 09/05/2022   GFRAA 69 10/24/2018   QFTBGOLDPLUS NEGATIVE 07/25/2022    Speciality Comments: No specialty comments available.  Procedures:  No procedures performed Allergies: Epinephrine, Nsaids, Other, Pregabalin, Tolmetin, Ciprofloxacin, Parathyroid hormone (recomb), Prednisone, Tape, Tapentadol, Captopril, Lisinopril, Celecoxib, Codeine, Lidocaine hcl, Procaine, Sulfa antibiotics, and Sulfasalazine   Assessment / Plan:     Visit Diagnoses: No diagnosis found.  ***  Orders: No orders of the defined types were placed in this encounter.  No orders of the defined types were placed in this encounter.    Follow-Up Instructions: No follow-ups on file.   Fuller Plan, MD  Note - This record has been created using  [employer].  Chart creation errors have been sought, but may not always  have been located. Such creation errors do not reflect on  the standard of medical care.

## 2023-01-08 ENCOUNTER — Ambulatory Visit: Payer: Medicare Other | Attending: Internal Medicine | Admitting: Internal Medicine

## 2023-01-08 ENCOUNTER — Encounter: Payer: Self-pay | Admitting: Internal Medicine

## 2023-01-08 VITALS — BP 172/94 | HR 61 | Resp 14 | Ht 64.0 in | Wt 237.0 lb

## 2023-01-08 DIAGNOSIS — M25562 Pain in left knee: Secondary | ICD-10-CM | POA: Diagnosis not present

## 2023-01-08 DIAGNOSIS — G8929 Other chronic pain: Secondary | ICD-10-CM | POA: Insufficient documentation

## 2023-01-08 DIAGNOSIS — R7982 Elevated C-reactive protein (CRP): Secondary | ICD-10-CM | POA: Diagnosis not present

## 2023-01-08 DIAGNOSIS — I7300 Raynaud's syndrome without gangrene: Secondary | ICD-10-CM | POA: Diagnosis not present

## 2023-01-08 DIAGNOSIS — M25561 Pain in right knee: Secondary | ICD-10-CM | POA: Diagnosis not present

## 2023-01-08 DIAGNOSIS — R768 Other specified abnormal immunological findings in serum: Secondary | ICD-10-CM | POA: Diagnosis not present

## 2023-01-08 DIAGNOSIS — R040 Epistaxis: Secondary | ICD-10-CM | POA: Insufficient documentation

## 2023-01-08 MED ORDER — HYDROXYCHLOROQUINE SULFATE 200 MG PO TABS
200.0000 mg | ORAL_TABLET | Freq: Every day | ORAL | 2 refills | Status: DC
Start: 2023-01-08 — End: 2023-08-21

## 2023-01-14 ENCOUNTER — Encounter: Payer: Self-pay | Admitting: Internal Medicine

## 2023-01-14 ENCOUNTER — Encounter: Payer: Self-pay | Admitting: Cardiology

## 2023-01-19 ENCOUNTER — Telehealth: Payer: Self-pay

## 2023-01-19 NOTE — Telephone Encounter (Signed)
Patient contacted the office and states she was put on a medication by Dr. Dimple Casey a couple weeks ago, after reviewing the chart I believe she is referring to Hydroxychloroquine. Patient states her urine has turned very dark. Patient states she is going to try to drink more water today to see if that will help. Patient states she is due to have carpal tunnel surgery on 01/26/2023 and wants to make sure that she will still be good to have this surgery and wants to make sure there is no blood in her urine. Please advise.

## 2023-01-22 ENCOUNTER — Telehealth: Payer: Self-pay | Admitting: Internal Medicine

## 2023-01-22 DIAGNOSIS — M1712 Unilateral primary osteoarthritis, left knee: Secondary | ICD-10-CM | POA: Diagnosis not present

## 2023-01-22 NOTE — Telephone Encounter (Signed)
Patient left a voicemail to let Dr. Dimple Casey know that she has been drinking a lot of water and her urine is not as dark.  Patient states she thinks the medication is okay.

## 2023-01-22 NOTE — Telephone Encounter (Signed)
FYI. Patient is referring to her message that was sent on 01/19/2023. Patient message was about her hydroxychloroquine.

## 2023-01-24 ENCOUNTER — Telehealth: Payer: Self-pay

## 2023-01-24 NOTE — Telephone Encounter (Signed)
Patient contacted the office and states her blood pressure has been running a little high and has stayed high. Patient states her blood pressures have been in the 140s/90s, for example it was 147/95. Patient states she has been in pain and has been using ice, heat , and tylenol as needed to control it. Advised the patient I would let Dr. Dimple Casey know and advised her to contact her primary care as well. Please advise.

## 2023-01-26 DIAGNOSIS — G5601 Carpal tunnel syndrome, right upper limb: Secondary | ICD-10-CM | POA: Diagnosis not present

## 2023-01-26 HISTORY — PX: CARPAL TUNNEL RELEASE: SHX101

## 2023-02-05 NOTE — Progress Notes (Signed)
Office Visit Note  Patient: [name]             Date of Birth: 1950/07/17           MRN: [id number]             PCP: Ollen Bowl, MD Referring: Ollen Bowl, MD Visit Date: 02/19/2023   Subjective:  Follow-up (Patient states she is having side effects from the hydroxychloroquine. Patient states within 15 minutes after she takes the medication she has diarrhea. Patient states she would have some confusion. Patient states her skin has changed colors before. )   History of Present Illness:   Discussed the use of AI scribe software for clinical note transcription with the patient, who gave verbal consent to proceed.  History of Present Illness   [name] is a 72 y.o. female here for follow up for ongoing joint pain and swelling low-grade fevers and rashes with a positive ANA with a history of rheumatoid arthritis (RA), psoriasis, and Raynaud's.  Due to minor lab abnormalities started hydroxychloroquine after last visit.  They report multiple potential side effects since starting the medication. Initially, they experienced dizziness, which has since subsided. However, they note persistent gastrointestinal upset, manifesting as diarrhea approximately 15 minutes after medication intake. They also report increased urinary frequency, but it is unclear whether this is due to the medication or their increased water intake for reported dry mouth.  The patient also reports visual disturbances, specifically blurriness in the right eye. They acknowledge concurrent gabapentin therapy, which can cause similar symptoms, but the visual changes seem more pronounced since starting hydroxychloroquine. They also report occasional confusion and difficulty recalling certain words, which is a new symptom.  The patient has noticed increased fatigue, but acknowledges pre-existing fatigue due to their RA. They also report persistent swelling in one leg, which has been evaluated by a  vascular surgeon. They have a history of a collapsed vein and subsequent removal of the greater saphenous vein, which has led to ongoing complications and infections. They also report a rash on their scalp, which turned blue after a recent hair appointment. They have noticed changes in their stool color, which has turned orange to grayish since starting hydroxychloroquine.  She had recent right-sided carpal tunnel release surgery with Dr. Amanda Pea on November 15.  Previous HPI 01/08/2023 [name] is a 72 y.o. female here for follow up ongoing joint pain and swelling low-grade fevers and rashes with a positive ANA.  Lab test at our initial visit showed mild elevation in CRP and complement C3 otherwise unremarkable.  She went for bilateral knee steroid injection due to increased pain and swelling this was beneficial but still having problem along the medial side of the left knee.  12/06/22 [name] is a 72 y.o. female here for evaluation of positive ANA checked in association with multiple symptoms but particularly recurring low grade fevers, sweats, and decreased exertion tolerance.  She is referred from infectious disease clinic after evaluation for possible fever of unknown origin but not strictly meeting criteria and infectious workup was unremarkable.  Was treated for dental abscess was also found to have small fluid collection at previous lumbar laminectomy site unclear if significant.  She has body aches in multiple areas does have chronic arthritis but pain overall has been worse during the past 1 year.  She has noticed some new and progressive deformities with nodules and lateral deviation in multiple fingers and toes. Also has  increase in edema see some swelling in her hands in the morning swelling at the feet and ankles in the morning but more severe at the evening. Also has rashes and fingernail changes with ridges and discolored appearance.  She sees blue or dusky  discoloration at the tips of the fingers this comes and goes.  Describes chronic dryness in her eyes and mouth and gets painful mouth sores intermittently.  Had some episodes with nosebleeds also saw some clots present in it. Has undergone extensive workup including CT of chest abdomen pelvis and MRI of the lumbar spine and SI joints.  Echocardiogram was unremarkable.  Persistent low-grade elevation in CRP and low positive ANA.  By review also had ANA checked in 2015 at same titer no particular underlying disease identified at the time.   Labs reviewed 07/2022 ANA 1:80 DFS   05/2013 ANA 1:80 diffuse   Review of Systems  Constitutional:  Positive for fatigue.  HENT:  Positive for mouth dryness. Negative for mouth sores.   Eyes:  Positive for dryness.  Respiratory:  Positive for shortness of breath.   Cardiovascular:  Positive for palpitations. Negative for chest pain.  Gastrointestinal:  Positive for diarrhea. Negative for blood in stool and constipation.  Endocrine: Positive for increased urination.  Genitourinary:  Positive for involuntary urination.  Musculoskeletal:  Positive for joint pain, gait problem, joint pain, joint swelling, myalgias, muscle weakness, morning stiffness, muscle tenderness and myalgias.  Skin:  Positive for color change, rash, hair loss and sensitivity to sunlight.  Allergic/Immunologic: Positive for susceptible to infections.  Neurological:  Positive for dizziness. Negative for headaches.  Hematological:  Negative for swollen glands.  Psychiatric/Behavioral:  Positive for depressed mood and sleep disturbance. The patient is nervous/anxious.     PMFS History:  Patient Active Problem List   Diagnosis Date Noted   Urinary frequency 02/19/2023   CRP elevated 01/08/2023   Positive ANA (antinuclear antibody) 12/06/2022   NASH (nonalcoholic steatohepatitis) 09/05/2022   Elevated LFTs 07/25/2022   Female stress incontinence 07/04/2022   Lesion of vulva 07/04/2022    High body temperature 07/04/2022   Cervical radiculitis 10/17/2021   Carpal tunnel syndrome on right 10/17/2021   Synovial cyst of lumbar facet joint    Raynaud disease    Migraines    Migraine headache    Hypertension    Hx of appendectomy    High cholesterol    Collagen vascular disease (HCC)    Chronic knee pain    Chronic kidney disease    Allergy    Arthritis    Chronic right-sided low back pain with right-sided sciatica 12/12/2018   Left-sided trigeminal neuralgia 06/03/2018   Trigeminal neuralgia of left side of face 04/30/2018   APC (atrial premature contractions) 02/17/2018   PAT (paroxysmal atrial tachycardia) (HCC) 02/17/2018   Palpitations 12/24/2017   Angular cheilitis 05/10/2017   Dysphonia 05/10/2017   Laryngopharyngeal reflux (LPR) 05/10/2017   Mucous retention cyst of maxillary sinus 05/10/2017   Recurrent epistaxis 05/10/2017   Upper airway cough syndrome 04/26/2017   Fibromyalgia 02/27/2017   Sepsis (HCC) 10/23/2016   Salmonella enteritis 10/23/2016   Colitis 10/21/2016   CAP (community acquired pneumonia) 10/21/2016   Fever with chills 10/21/2016   Melanoma (HCC) 11/19/2015   SVT (supraventricular tachycardia) (HCC) 05/20/2015   Chronic venous insufficiency 10/10/2013   Neuropathy 04/23/2013   Psoriasis 04/23/2013   Deformity of joint 04/23/2013   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   Chondromalacia patellae 12/02/2012   Finger injury 11/08/2012  Tear of lateral meniscus of left knee 09/30/2012   Peripheral cyanosis 09/28/2012   DJD (degenerative joint disease) of knee 09/02/2012   Status post lumbar spinal fusion 10/02/2011   Routine health maintenance 08/17/2011   Anemia, iron deficiency 01/22/2011   Chronic back pain 01/02/2011   Pseudoarthrosis of lumbar spine 01/02/2011   Chronic pain 09/22/2010   Depression with anxiety 09/21/2010   Spinal stenosis 08/14/2010   Hypertensive chronic kidney disease 07/08/2010   Migraine  07/08/2010   Knee internal derangement 07/08/2010   Chest pain in adult 07/08/2010   Essential hypertension 07/08/2010   Derangement of knee, left 2009    Past Medical History:  Diagnosis Date   Allergy    Anemia, iron deficiency 01/22/2011   May '12 - 13.8 Hgb, Nov 2, '12 Hgb 10.9    APC (atrial premature contractions) 02/17/2018   Arthritis    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 Dulce Sellar per patient report.  Overview:  CAD risk score is low at 3%   Chondromalacia patellae 12/02/2012   Chronic back pain    Chronic kidney disease    Chronic knee pain    Chronic pain 09/22/2010   Multiple sources of pain: radicular pain from lumbar disease and post-operative pain; neck pain from cervical disk disease; left knee pain from trauma.  Plan - will wean off prednisone           Will continue percocet at 1  5/325 norco every 6 hours prn           NSAID - trial of meloxicam 16m g once a day           Increase gabapentin slowly to 600mg  tid and reassess           Executed a pain con   Chronic venous insufficiency 10/10/2013   Colitis 10/21/2016   Collagen vascular disease (HCC)    Deformity of joint 04/23/2013   Depression with anxiety 09/21/2010   Derangement of knee, left 2009   started with fall at work. Has had repair patella and torn meniscus   Diverticulosis    DJD (degenerative joint disease) of knee 09/02/2012   Essential hypertension 07/08/2010   Long standing hypertension medically managed. Currently seeing Dr. Verdis Prime and Dr. Doy Mince ([facility]) for cardiology.  Meds  HCT only  Overview:  Overview:  Long standing hypertension medically managed. Currently seeing Dr. Verdis Prime and Dr. Dulce Sellar ([facility])  for cardiology.  Meds  HCT only  Last Assessment & Plan:  Formatting of this note may be different from the original. BP Readings   Fever chills 10/21/2016   Fibromyalgia 02/27/2017   High cholesterol    Hx of appendectomy    Hypertension    Hypertensive chronic kidney disease 07/08/2010   Long standing hypertension medically managed. Currently seeing Dr. Verdis Prime and Dr. Doy Mince ([facility]) for cardiology.  Meds  HCT only  Overview:  Overview:  Long standing hypertension medically managed. Currently seeing Dr. Verdis Prime and Dr. Dulce Sellar ([facility]) for cardiology.  Meds  HCT only  Last Assessment & Plan:  Formatting of this note may be different from the original. BP Readings   Hypothyroidism    Knee internal derangement 07/08/2010   Left knee: Feb '09  medial and lateral meniscal tears by MRI                  April '09 arthroscopic surgery    Melanoma (HCC) 11/19/2015   Migraine headache    hormonal related - less frequent off hormone replacement   Migraines    NASH (nonalcoholic steatohepatitis) 09/05/2022   Neuropathy 04/23/2013   PAT (paroxysmal atrial tachycardia) (HCC) 02/17/2018   Peripheral cyanosis 09/28/2012   Full evaluation by Dr. Azzie Roup for rheumatology - no evidence of Raynaud's syndrome or disease; no evidence of underlying rheumatologic disease or malignancy. Most probable cause is neurogenic.    Polyp of colon    Pseudoarthrosis of lumbar spine 01/02/2011   Psoriasis 04/23/2013   Raynaud disease    Routine health maintenance 08/17/2011   Colonoscopy - no record in EPIC of colonoscopy  Immunizations - Tdap June '13.     Salmonella enteritis 10/23/2016   Scabies    Sepsis (HCC) 10/23/2016   Spinal stenosis 08/14/2010   history of cervical laminectomy, lumbar diskectomy with fixation devices and redo surgery.    Status post lumbar spinal fusion 10/02/2011   SVT (supraventricular tachycardia) (HCC)    Synovial cyst of lumbar facet joint    lower back - s/p  surgery    Tear of lateral meniscus of left knee 09/30/2012    Family History  Problem Relation Age of Onset   Diabetes Mother    COPD Mother    Aortic aneurysm Mother    Hypertension Mother    Hypothyroidism Mother    Cancer Father        lung   Hyperlipidemia Father    Hypertension Father    Heart disease Father    Diabetes Father    Heart attack Father    Diabetes Brother    Past Surgical History:  Procedure Laterality Date   APPENDECTOMY  1971   CARPAL TUNNEL RELEASE Right 01/26/2023   CERVICAL DISCECTOMY  1999   diskectomy with fixation:plate and screws   COLONOSCOPY     ESOPHAGOGASTRODUODENOSCOPY  12/2014   KNEE ARTHROSCOPY W/ MENISCAL REPAIR  2009   left knee   L1-S1 Fusion     L4-5 lumbar fusion  09/02/2010   Dr. Gerlene Fee:  minimally invasive transforaminal interbody fusion with spacer   OVARIAN CYST SURGERY  1972   PATELLAR REEFING  '09   repair of fractured patella after fall   SPINE SURGERY     SYNOVIAL CYST EXCISION  2007   lumbar spine   VENOUS ABLATION     for pain in the groin - [facility] did procedure. Has some residual discomfort at the  ablation site.    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 Married '71- '10/widowed; engaged. 1 dtr- '72; 1 son '73; 4 grandchildren, 2 step-grands. 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   Right handed   Lives at home with spouse    2-3 cups daily of caffeine    Immunization History  Administered Date(s) Administered   Influenza Split 12/22/2010, 11/25/2014   Influenza, High Dose Seasonal PF 12/26/2017, 11/20/2018   Influenza,inj,Quad PF,6+ Mos 12/22/2012   Influenza-Unspecified 12/22/2010   PFIZER(Purple Top)SARS-COV-2 Vaccination 04/04/2019, 04/25/2019   Pneumococcal Conjugate-13 11/24/2014   Tdap 08/16/2011     Objective: Vital Signs: BP 121/72 (BP Location:  Left Arm, Patient Position: Sitting,  Cuff Size: Large)   Pulse 69   Resp 14   Ht 5\' 4"  (1.626 m)   Wt 238 lb (108 kg)   BMI 40.85 kg/m    Physical Exam Cardiovascular:     Rate and Rhythm: Normal rate and regular rhythm.  Pulmonary:     Effort: Pulmonary effort is normal.     Breath sounds: Normal breath sounds.  Skin:    General: Skin is warm and dry.     Findings: No rash.  Neurological:     Mental Status: She is alert.  Psychiatric:        Mood and Affect: Mood normal.      Musculoskeletal Exam:  Elbows full ROM no tenderness or swelling Right wrist surgical scar closed and appears well-healing no tenderness no swelling Fingers with DIP Heberden's nodes bilaterally and some lateral deviation in distal fingers No paraspinal tenderness to palpation over upper and lower back Hip normal internal and external rotation without pain, no tenderness to lateral hip palpation Left knee swelling at medial and posterior sides, posterior tenderness to pressure Ankles full ROM no tenderness or swelling MTPs full ROM no tenderness or swelling    Investigation: No additional findings.  Imaging: No results found.  Recent Labs: Lab Results  Component Value Date   WBC 8.3 02/19/2023   HGB 14.1 02/19/2023   PLT 290 02/19/2023   NA 139 02/19/2023   K 4.5 02/19/2023   CL 102 02/19/2023   CO2 26 02/19/2023   GLUCOSE 103 (H) 02/19/2023   BUN 10 02/19/2023   CREATININE 0.87 02/19/2023   BILITOT 0.3 02/19/2023   ALKPHOS 126 (H) 02/12/2022   AST 54 (H) 02/19/2023   ALT 43 (H) 02/19/2023   PROT 7.7 02/19/2023   ALBUMIN 4.4 02/12/2022   CALCIUM 9.4 02/19/2023   GFRAA 69 10/24/2018   QFTBGOLDPLUS NEGATIVE 07/25/2022    Speciality Comments: No specialty comments available.  Procedures:  No procedures performed Allergies: Epinephrine, Nsaids, Other, Pregabalin, Tolmetin, Ciprofloxacin, Parathyroid hormone (recomb), Prednisone, Tape, Tapentadol, Captopril, Lisinopril,  Celecoxib, Codeine, Lidocaine hcl, Procaine, Sulfa antibiotics, and Sulfasalazine   Assessment / Plan:     Visit Diagnoses: Positive ANA (antinuclear antibody) - Plan: Sedimentation rate, C-reactive protein, CBC with Differential/Platelet, COMPLETE METABOLIC PANEL WITH GFR Rheumatoid Arthritis Patient reports multiple potential side effects since starting hydroxychloroquine including gastrointestinal upset, increased urination, dry mouth, blurred vision, cognitive changes, and skin changes. No clear improvement in joint symptoms yet noted by the patient. -Order complete metabolic panel and recheck inflammatory markers (CRP, complements) to assess for objective response to hydroxychloroquine. -Based on lab results, consider continuing hydroxychloroquine if improvement noted or discontinuing if no significant response and side effects persist. -If needing to stop hydroxychloroquine could consider alternatives such as trial of subcutaneous methotrexate taking baseline monitoring labs as well.  Knee Osteoarthritis Persistent knee swelling and pain despite recent injections. Discussion of potential need for knee replacement.  Lower Extremity Edema Persistent swelling in the lower extremity, worse with standing. Recent referral to vascular surgery.  Primarily on the left side with localized distribution could be more lymphedema related. -Continue with planned vascular surgery consultation.  Carpal Tunnel Syndrome Recent surgery with good recovery but limited finger mobility. -Continue with post-operative exercises and massage as tolerated.   Urinary frequency - Plan: Urine Culture, Urinalysis, Routine w reflex microscopic  No associated lower abdominal pain, microscopic hematuria or pain just increase in frequency compared to usual.  Orders: Orders Placed This Encounter  Procedures  Urine Culture   MICROSCOPIC MESSAGE   Sedimentation rate   C-reactive protein   CBC with  Differential/Platelet   COMPLETE METABOLIC PANEL WITH GFR   Urinalysis, Routine w reflex microscopic   No orders of the defined types were placed in this encounter.    Follow-Up Instructions: Return in about 3 months (around 05/20/2023) for ?CTD HCQ f/u 3mos.   Fuller Plan, MD  Note - This record has been created using [employer].  Chart creation errors have been sought, but may not always  have been located. Such creation errors do not reflect on  the standard of medical care.

## 2023-02-16 DIAGNOSIS — M25641 Stiffness of right hand, not elsewhere classified: Secondary | ICD-10-CM | POA: Diagnosis not present

## 2023-02-19 ENCOUNTER — Ambulatory Visit: Payer: Medicare Other | Attending: Internal Medicine | Admitting: Internal Medicine

## 2023-02-19 ENCOUNTER — Encounter: Payer: Self-pay | Admitting: Internal Medicine

## 2023-02-19 VITALS — BP 121/72 | HR 69 | Resp 14 | Ht 64.0 in | Wt 238.0 lb

## 2023-02-19 DIAGNOSIS — M25561 Pain in right knee: Secondary | ICD-10-CM | POA: Diagnosis not present

## 2023-02-19 DIAGNOSIS — R350 Frequency of micturition: Secondary | ICD-10-CM | POA: Diagnosis not present

## 2023-02-19 DIAGNOSIS — I7300 Raynaud's syndrome without gangrene: Secondary | ICD-10-CM | POA: Insufficient documentation

## 2023-02-19 DIAGNOSIS — M25562 Pain in left knee: Secondary | ICD-10-CM | POA: Diagnosis not present

## 2023-02-19 DIAGNOSIS — R768 Other specified abnormal immunological findings in serum: Secondary | ICD-10-CM | POA: Insufficient documentation

## 2023-02-19 DIAGNOSIS — Z79899 Other long term (current) drug therapy: Secondary | ICD-10-CM | POA: Diagnosis not present

## 2023-02-19 DIAGNOSIS — G8929 Other chronic pain: Secondary | ICD-10-CM | POA: Insufficient documentation

## 2023-02-19 DIAGNOSIS — R040 Epistaxis: Secondary | ICD-10-CM | POA: Insufficient documentation

## 2023-02-20 LAB — URINALYSIS, ROUTINE W REFLEX MICROSCOPIC
Bilirubin Urine: NEGATIVE
Glucose, UA: NEGATIVE
Hgb urine dipstick: NEGATIVE
Hyaline Cast: NONE SEEN /[LPF]
Ketones, ur: NEGATIVE
Nitrite: NEGATIVE
RBC / HPF: NONE SEEN /[HPF] (ref 0–2)
Specific Gravity, Urine: 1.015 (ref 1.001–1.035)
pH: 5.5 (ref 5.0–8.0)

## 2023-02-20 LAB — COMPLETE METABOLIC PANEL WITH GFR
AG Ratio: 1.3 (calc) (ref 1.0–2.5)
ALT: 43 U/L — ABNORMAL HIGH (ref 6–29)
AST: 54 U/L — ABNORMAL HIGH (ref 10–35)
Albumin: 4.4 g/dL (ref 3.6–5.1)
Alkaline phosphatase (APISO): 106 U/L (ref 37–153)
BUN: 10 mg/dL (ref 7–25)
CO2: 26 mmol/L (ref 20–32)
Calcium: 9.4 mg/dL (ref 8.6–10.4)
Chloride: 102 mmol/L (ref 98–110)
Creat: 0.87 mg/dL (ref 0.60–1.00)
Globulin: 3.3 g/dL (ref 1.9–3.7)
Glucose, Bld: 103 mg/dL — ABNORMAL HIGH (ref 65–99)
Potassium: 4.5 mmol/L (ref 3.5–5.3)
Sodium: 139 mmol/L (ref 135–146)
Total Bilirubin: 0.3 mg/dL (ref 0.2–1.2)
Total Protein: 7.7 g/dL (ref 6.1–8.1)
eGFR: 71 mL/min/{1.73_m2} (ref >=60)

## 2023-02-20 LAB — CBC WITH DIFFERENTIAL/PLATELET
Absolute Lymphocytes: 2092 {cells}/uL (ref 850–3900)
Absolute Monocytes: 531 {cells}/uL (ref 200–950)
Basophils Absolute: 58 {cells}/uL (ref 0–200)
Basophils Relative: 0.7 %
Eosinophils Absolute: 473 {cells}/uL (ref 15–500)
Eosinophils Relative: 5.7 %
HCT: 42.8 % (ref 35.0–45.0)
Hemoglobin: 14.1 g/dL (ref 11.7–15.5)
MCH: 29.3 pg (ref 27.0–33.0)
MCHC: 32.9 g/dL (ref 32.0–36.0)
MCV: 88.8 fL (ref 80.0–100.0)
MPV: 9.6 fL (ref 7.5–12.5)
Monocytes Relative: 6.4 %
Neutro Abs: 5146 {cells}/uL (ref 1500–7800)
Neutrophils Relative %: 62 %
Platelets: 290 10*3/uL (ref 140–400)
RBC: 4.82 10*6/uL (ref 3.80–5.10)
RDW: 12.9 % (ref 11.0–15.0)
Total Lymphocyte: 25.2 %
WBC: 8.3 10*3/uL (ref 3.8–10.8)

## 2023-02-20 LAB — MICROSCOPIC MESSAGE

## 2023-02-20 LAB — URINE CULTURE
MICRO NUMBER:: 15826735
Result:: NO GROWTH
SPECIMEN QUALITY:: ADEQUATE

## 2023-02-20 LAB — SEDIMENTATION RATE: Sed Rate: 19 mm/h (ref 0–30)

## 2023-02-20 LAB — C-REACTIVE PROTEIN: CRP: 10 mg/L — ABNORMAL HIGH (ref <8.0)

## 2023-02-21 ENCOUNTER — Telehealth: Payer: Self-pay | Admitting: *Deleted

## 2023-02-21 NOTE — Telephone Encounter (Signed)
Patient contacted the office stating she is having pain in her lower back, frequent urination and burning with urination. Patient advised we do not treat UTI's in our office. Patient advised her results for her urine culture show no growth. Patient advised she will need to reach out to her PCP. Patient states she would like results sent to PCP. Results faxed as requested.

## 2023-02-25 ENCOUNTER — Encounter: Payer: Self-pay | Admitting: Internal Medicine

## 2023-02-26 ENCOUNTER — Ambulatory Visit: Payer: Medicare Other | Attending: Internal Medicine | Admitting: Internal Medicine

## 2023-02-26 VITALS — BP 146/81 | HR 61 | Resp 14

## 2023-02-26 DIAGNOSIS — M545 Low back pain, unspecified: Secondary | ICD-10-CM

## 2023-02-26 MED ORDER — LIDOCAINE HCL 1 % IJ SOLN
1.0000 mL | INTRAMUSCULAR | Status: AC | PRN
Start: 2023-02-26 — End: 2023-02-26
  Administered 2023-02-26: 1 mL

## 2023-02-26 MED ORDER — TRIAMCINOLONE ACETONIDE 40 MG/ML IJ SUSP
40.0000 mg | INTRAMUSCULAR | Status: AC | PRN
Start: 2023-02-26 — End: 2023-02-26
  Administered 2023-02-26: 40 mg via INTRA_ARTICULAR

## 2023-02-26 NOTE — Progress Notes (Signed)
   Procedure Note  Patient: [name]             Date of Birth: [birth year]           MRN: [id number]             Visit Date: 02/26/2023  Procedures: Visit Diagnoses:  1. Acute left-sided low back pain without sciatica     Sacroiliac Joint Inj on 02/26/2023 11:05 AM Indications: pain Details: 22 G 3.5 in needle, posterior approach Medications: 1 mL lidocaine 1 %; 40 mg triamcinolone acetonide 40 MG/ML Outcome: tolerated well, no immediate complications Procedure, treatment alternatives, risks and benefits explained, specific risks discussed. Consent was given by the patient. Immediately prior to procedure a time out was called to verify the correct patient, procedure, equipment, support staff and site/side marked as required. Patient was prepped and draped in the usual sterile fashion.      Here for severe acute left-sided low back and sacral pain ongoing for about 1-1/2 weeks.  She thinks this may have started after bending very far forward and lifting all preparing numerous Christmas gifts for her grandchildren.  Initially was severely painful over weekend before last but partially improved during the week.  However severely worse again during this last weekend.  She is taking tizanidine as needed and tramadol which have not controlled symptoms.  She is not taking her hydrocodone because she does not take this frequently.  She has numbness in the left buttock area but this is not new for her.  No pain radiating down the left leg or weakness or falls.  We discussed options including follow-up with spine specialist to repeat imaging though I do not see any red flags for damage of her surgical hardware or severe radiculopathy.  Discussed trial of oral steroids which usually cause her a lot of swelling and rashes she would prefer to avoid.  Proceeded with left SI joint local steroid injection for today and if failure to improve will start on medrol taper and f/u with her surgeon.

## 2023-02-26 NOTE — Telephone Encounter (Signed)
Scheduled for appointment today.

## 2023-02-27 DIAGNOSIS — Z0142 Encounter for cervical smear to confirm findings of recent normal smear following initial abnormal smear: Secondary | ICD-10-CM | POA: Diagnosis not present

## 2023-02-27 DIAGNOSIS — Z1272 Encounter for screening for malignant neoplasm of vagina: Secondary | ICD-10-CM | POA: Diagnosis not present

## 2023-02-27 DIAGNOSIS — Z1151 Encounter for screening for human papillomavirus (HPV): Secondary | ICD-10-CM | POA: Diagnosis not present

## 2023-02-27 DIAGNOSIS — Z780 Asymptomatic menopausal state: Secondary | ICD-10-CM | POA: Diagnosis not present

## 2023-02-27 DIAGNOSIS — R87810 Cervical high risk human papillomavirus (HPV) DNA test positive: Secondary | ICD-10-CM | POA: Diagnosis not present

## 2023-02-27 DIAGNOSIS — Z01411 Encounter for gynecological examination (general) (routine) with abnormal findings: Secondary | ICD-10-CM | POA: Diagnosis not present

## 2023-02-27 DIAGNOSIS — Z124 Encounter for screening for malignant neoplasm of cervix: Secondary | ICD-10-CM | POA: Diagnosis not present

## 2023-02-27 DIAGNOSIS — Z01419 Encounter for gynecological examination (general) (routine) without abnormal findings: Secondary | ICD-10-CM | POA: Diagnosis not present

## 2023-02-27 DIAGNOSIS — R87610 Atypical squamous cells of undetermined significance on cytologic smear of cervix (ASC-US): Secondary | ICD-10-CM | POA: Diagnosis not present

## 2023-03-04 ENCOUNTER — Encounter: Payer: Self-pay | Admitting: Internal Medicine

## 2023-03-12 ENCOUNTER — Other Ambulatory Visit: Payer: Self-pay | Admitting: *Deleted

## 2023-03-12 ENCOUNTER — Ambulatory Visit: Payer: Medicare Other | Admitting: Internal Medicine

## 2023-03-12 DIAGNOSIS — M7989 Other specified soft tissue disorders: Secondary | ICD-10-CM

## 2023-03-12 DIAGNOSIS — I890 Lymphedema, not elsewhere classified: Secondary | ICD-10-CM

## 2023-03-13 DIAGNOSIS — R748 Abnormal levels of other serum enzymes: Secondary | ICD-10-CM | POA: Diagnosis not present

## 2023-03-13 DIAGNOSIS — M797 Fibromyalgia: Secondary | ICD-10-CM | POA: Diagnosis not present

## 2023-03-13 DIAGNOSIS — I129 Hypertensive chronic kidney disease with stage 1 through stage 4 chronic kidney disease, or unspecified chronic kidney disease: Secondary | ICD-10-CM | POA: Diagnosis not present

## 2023-03-13 DIAGNOSIS — E039 Hypothyroidism, unspecified: Secondary | ICD-10-CM | POA: Diagnosis not present

## 2023-03-13 DIAGNOSIS — G629 Polyneuropathy, unspecified: Secondary | ICD-10-CM | POA: Diagnosis not present

## 2023-03-13 DIAGNOSIS — R768 Other specified abnormal immunological findings in serum: Secondary | ICD-10-CM | POA: Diagnosis not present

## 2023-03-13 DIAGNOSIS — K760 Fatty (change of) liver, not elsewhere classified: Secondary | ICD-10-CM | POA: Diagnosis not present

## 2023-03-13 DIAGNOSIS — F411 Generalized anxiety disorder: Secondary | ICD-10-CM | POA: Diagnosis not present

## 2023-03-13 DIAGNOSIS — L409 Psoriasis, unspecified: Secondary | ICD-10-CM | POA: Diagnosis not present

## 2023-03-13 DIAGNOSIS — I890 Lymphedema, not elsewhere classified: Secondary | ICD-10-CM | POA: Diagnosis not present

## 2023-03-13 DIAGNOSIS — Z0000 Encounter for general adult medical examination without abnormal findings: Secondary | ICD-10-CM | POA: Diagnosis not present

## 2023-03-13 DIAGNOSIS — I700 Atherosclerosis of aorta: Secondary | ICD-10-CM | POA: Diagnosis not present

## 2023-03-19 NOTE — Progress Notes (Signed)
 Requested by:  Camella Fallow, MD [address] [place],  [place] [address]  Reason for consultation: concern for DVT    History of Present Illness   [name] is a 73 y.o. ([birth year]) female who presents with persistent LLE swelling and concern for DVT.  She has previously been seen by Dr. Oris for left GSV reflux, to which she underwent ablation plus stab phlebectomy of multiple tributary varicosities in 2011.  This helped her swelling moderately. Her last visit regarding continued left leg swelling was with Dr. Oris in October 2017.  She did have reflux in her left common femoral vein and minimal dilatation of the anterior accessory branch.  It was felt that she would only have minimal if any improvement in treating the anterior accessory branch, so it was recommended she continued conservative treatment with compression and elevation. She was last seen by Dr. Oris on 03/22/2016 for right foot pain and swelling.  DVT study done at that visit demonstrated no DVT or SVT.  Her right foot pain was thought to be caused by her multiple back surgeries and issues with that. She was reassured that she does not have any evidence of arterial or venous pathology in her right leg.  She was then evaluated by [facility] in [place] in 2021 for recurrence of varicosities in her left leg.  They performed left GSV stripping with left lower extremity phlebectomy in May 2021.  Her vein was very tortuous and required multiple cutdowns.  Postop she had issues with infection and hematoma formation.  She was placed on antibiotics and her infection resolved.  She was recommended to follow-up as needed and continue using her lymphedema pumps.  Today she is here for evaluation of possible DVT in the left leg.  She is getting back surgery at the end of this month, and her surgeon wants to ensure she is safe for surgery.  She states she continues to have bilateral leg  swelling that is worse at the end of the day.  She notes an area on her left calf that bulges when going from a sitting to standing position.  This bulge has been present since her surgery in 2021.  She denies any pain in that area.  She denies any ulcerations.  She denies a history of DVT.  She denies any symptoms of PAD such as claudication, rest pain, nonhealing wounds of the lower extremities.  She does have diabetic neuropathy in her right foot.  She takes gabapentin  for this.  03/19/22: Patient returns to clinic for persistent discomfort in her legs.  She reports to the pretibial skin, especially at the left side.  She notes progressive swelling throughout the day.  All this is typical of her established diagnosis of venous insufficiency and lymphedema.  I counseled the patient about benign nature of these findings.  I counseled her that no vascular intervention would resolve her symptoms.  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 Monetta per patient  report.  Overview:  CAD risk score is low at 3%   Chondromalacia patellae 12/02/2012   Chronic back pain    Chronic kidney disease    Chronic knee pain    Chronic pain 09/22/2010   Multiple sources of pain: radicular pain from lumbar disease and post-operative pain; neck pain from cervical disk disease; left knee pain from trauma.  Plan - will wean off prednisone           Will continue percocet at 1  5/325 norco every 6 hours prn           NSAID - trial of meloxicam  46m g once a day            Increase gabapentin  slowly to 600mg  tid and reassess           Executed a pain con   Chronic venous insufficiency 10/10/2013   Colitis 10/21/2016   Collagen vascular disease (HCC)    Deformity of joint 04/23/2013   Depression with anxiety 09/21/2010   Derangement of knee, left 2009   started with fall at work. Has had repair patella and torn meniscus   Diverticulosis    DJD (degenerative joint disease) of knee 09/02/2012   Essential hypertension 07/08/2010   Long standing hypertension medically managed. Currently seeing Dr. Victory Sharps and Dr. Verle ([facility]) for cardiology.  Meds  HCT only  Overview:  Overview:  Long standing hypertension medically managed. Currently seeing Dr. Victory Sharps and Dr. Monetta ([facility]) for cardiology.  Meds  HCT only  Last Assessment & Plan:  Formatting of this note may be different from the original. BP Readings   Fever chills 10/21/2016   Fibromyalgia 02/27/2017   High cholesterol    Hx of appendectomy    Hypertension    Hypertensive chronic kidney disease 07/08/2010   Long standing hypertension medically managed. Currently seeing Dr. Victory Sharps and Dr. Verle ([facility]) for cardiology.  Meds  HCT only  Overview:  Overview:  Long standing hypertension medically managed. Currently seeing Dr. Victory Sharps and Dr. Monetta ([facility]) for cardiology.  Meds  HCT only  Last Assessment & Plan:  Formatting of this note may be different from the original. BP Readings   Hypothyroidism    Knee internal derangement 07/08/2010   Left knee: Feb '09 medial and lateral meniscal tears by MRI                  April '09 arthroscopic surgery    Melanoma (HCC) 11/19/2015   Migraine headache    hormonal related - less frequent off hormone replacement   Migraines    NASH (nonalcoholic steatohepatitis) 09/05/2022   Neuropathy 04/23/2013   PAT (paroxysmal atrial tachycardia) (HCC) 02/17/2018   Peripheral cyanosis 09/28/2012   Full evaluation by Dr. Ludie Piety for rheumatology - no evidence of Raynaud's syndrome or disease; no evidence of underlying rheumatologic disease or malignancy. Most probable cause is neurogenic.    Polyp of colon    Pseudoarthrosis of lumbar spine 01/02/2011   Psoriasis 04/23/2013   Raynaud disease    Routine health maintenance 08/17/2011   Colonoscopy - no record in EPIC of colonoscopy  Immunizations - Tdap June '13.     Salmonella enteritis 10/23/2016   Scabies    Sepsis (HCC) 10/23/2016   Spinal stenosis 08/14/2010   history of cervical laminectomy, lumbar diskectomy with fixation devices and redo surgery.    Status post lumbar spinal fusion 10/02/2011   SVT (supraventricular tachycardia) (HCC)  Synovial cyst of lumbar facet joint    lower back - s/p surgery    Tear of lateral meniscus of left knee 09/30/2012    Past Surgical History:  Procedure Laterality Date   APPENDECTOMY  1971   CARPAL TUNNEL RELEASE Right 01/26/2023   CERVICAL DISCECTOMY  1999   diskectomy with fixation:plate and screws   COLONOSCOPY     ESOPHAGOGASTRODUODENOSCOPY  12/2014   KNEE ARTHROSCOPY W/ MENISCAL REPAIR  2009   left knee   L1-S1 Fusion     L4-5 lumbar fusion  09/02/2010   Dr. Carles:  minimally invasive transforaminal interbody fusion with spacer   OVARIAN CYST SURGERY  1972   PATELLAR REEFING  '09   repair of fractured patella after fall   SPINE SURGERY     SYNOVIAL CYST EXCISION  2007   lumbar spine   VENOUS ABLATION     for pain in the groin - [facility] did procedure. Has some residual discomfort at the  ablation site.    WISDOM TOOTH EXTRACTION      Social History   Socioeconomic History   Marital status: Married    Spouse name: Not on file   Number of children: Not on file   Years of education: 14   Highest education level: Not on file  Occupational History   Occupation: RECEP/ADMIN ASST.    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 Married '71- '10/widowed; engaged. 1 dtr- '72; 1 son '73; 4 grandchildren, 2 step-grands. 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   Right handed   Lives at home with spouse    2-3 cups daily of caffeine    Social Drivers of Health   Financial Resource Strain: Not on file  Food Insecurity: Not on file  Transportation Needs: Not on file  Physical Activity: Not on file  Stress: Not on file  Social Connections: Unknown (07/22/2021)   Received from [facility], [facility]   Social Network    Social Network: Not on file  Intimate Partner Violence: Unknown (06/13/2021)   Received from [facility], [facility]   HITS    Physically Hurt: Not on file    Insult or Talk Down To: Not on file    Threaten Physical Harm: Not on file    Scream or Curse: Not on file    Family History  Problem Relation Age of Onset   Diabetes Mother    COPD Mother    Aortic aneurysm Mother    Hypertension Mother    Hypothyroidism Mother    Cancer Father        lung   Hyperlipidemia Father    Hypertension Father    Heart disease Father    Diabetes Father    Heart attack Father    Diabetes Brother     Current Outpatient Medications  Medication Sig Dispense Refill   amitriptyline  (ELAVIL ) 10 MG tablet Take 20 mg by mouth at bedtime.      Apple Cid Vn-Grn Tea-Bit  Or-Cr (APPLE CIDER VINEGAR PLUS) TABS Take 1 tablet by mouth daily.     cholecalciferol (VITAMIN D) 1000 units tablet Take 1,000 Units by mouth 2 (two) times daily.      ciclopirox  (PENLAC ) 8 % solution Apply topically at bedtime. Apply to nail/surrounding skin and daily over  previous coat. Every 7 days remove with alcohol and continue. 6.6 mL 11   clonazePAM  (KLONOPIN ) 1 MG tablet Take 1 tablet (1 mg total) by mouth at bedtime as needed for anxiety. 30 tablet 0   clonazePAM  (KLONOPIN ) 1 MG tablet Take 1 tablet by mouth at bedtime.     esomeprazole  (NEXIUM ) 40 MG capsule Take 40 mg by mouth daily.     fluticasone  (FLONASE ) 50 MCG/ACT nasal spray Place 1 spray into both nostrils daily as needed for allergies.      furosemide  (LASIX ) 20 MG tablet Take 20 mg by mouth every other day.     gabapentin  (NEURONTIN ) 300 MG capsule Take 300 mg by mouth 3 (three) times daily.     Hyoscyamine  Sulfate SL (LEVSIN /SL) 0.125 MG SUBL Place 0.125 mg under the tongue 4 (four) times daily. 40 each 0   ketoconazole (NIZORAL) 2 % cream 1 application Externally Once a day for 14 days     levothyroxine  (SYNTHROID ) 88 MCG tablet Take 88 mcg by mouth at bedtime.     meloxicam  (MOBIC ) 15 MG tablet Take 15 mg by mouth daily as needed for pain.     metoprolol  succinate (TOPROL -XL) 25 MG 24 hr tablet Take 1 tablet (25 mg total) by mouth 2 (two) times daily. 60 tablet 2   Multiple Vitamins-Minerals (MULTIVITAMIN ADULT PO) Take 1 tablet by mouth daily.      Pyridoxine HCl (VITAMIN B6 PO) Take by mouth.     sucralfate  (CARAFATE ) 1 G tablet Take 1 g by mouth daily.     tiZANidine  (ZANAFLEX ) 2 MG tablet Take 2 mg by mouth 2 (two) times daily as needed. Reported on 04/05/2015     tretinoin (RETIN-A) 0.025 % cream 1 application to affected area in the evening to face Externally Once a day prn     Turmeric 500 MG CAPS Take 1,000 mg by mouth daily.     VITAMIN E PO Take 1 tablet by mouth daily.     zinc gluconate 50 MG tablet Take 50 mg by mouth daily.     hydroxychloroquine  (PLAQUENIL ) 200 MG tablet Take 1 tablet (200 mg total) by mouth daily. (Patient not taking: Reported on 03/20/2023) 30 tablet 2   spironolactone  (ALDACTONE ) 25 MG tablet Take 1 tablet (25 mg total) by mouth daily as needed. (Patient not  taking: Reported on 03/20/2023) 90 tablet 3   traMADol  (ULTRAM ) 50 MG tablet 1 tablet Orally as needed (Patient not taking: Reported on 03/20/2023)     No current facility-administered medications for this visit.    Allergies  Allergen Reactions   Epinephrine Palpitations    NASAL SPRAY Cardiac dysrhythmia     Nsaids Swelling    swelling     Other Swelling and Palpitations    Glucocorticoids/Steroids **ANTI INFLAMMATORY DRUGS**    Pregabalin Anaphylaxis   Tolmetin Swelling   Ciprofloxacin      Other reaction(s): nausea, Unknown   Parathyroid  Hormone (Recomb)     Other reaction(s): nose bleeds, Unknown   Prednisone Swelling   Tape Rash and Other (See Comments)    *adhesive* Reaction- blisters  Rips her skin     Tapentadol Other (See Comments)    *  adhesive* Reaction- blisters    Captopril    Lisinopril  Swelling and Other (See Comments)    Dizziness     Celecoxib Palpitations    Cardiac dysrhythmia    Codeine Palpitations    Cardiac dysrhythmia     Lidocaine  Hcl Palpitations   Procaine Palpitations and Other (See Comments)    dizziness    Sulfa Antibiotics Palpitations    Cardiac dysrhythmia    Sulfasalazine Palpitations     Physical Examination     Vitals:   03/20/23 1129  BP: 139/80  Pulse: 63  Resp: 20  Temp: 98.3 F (36.8 C)  SpO2: 96%  Weight: 235 lb (106.6 kg)  Height: 5' 4 (1.626 m)   Body mass index is 40.34 kg/m.  Elderly woman in no distress Regular rate and rhythm Unlabored breathing Weakly palpable dorsalis pedis pulses bilaterally Mild mixed lymphedema and venous insufficiency edema of bilateral lower extremities  Non-invasive Vascular Imaging   Left lower extremity venous reflux study:  - No evidence of deep vein thrombosis seen in the left lower extremity,  from the common femoral through the popliteal veins. No obvious DVT of the  calf veins.  - No evidence of superficial venous reflux seen in the left short  saphenous  vein.   - Greater saphenous vein ablated.  - Venous reflux is noted in the left common femoral vein.  - Venous reflux is noted in the left femoral vein.  - AASV not visualized    Medical Decision Making   [name] is a 73 y.o. female who presents with left lower extremity pain and swelling.  I counseled the patient her swelling is likely from venous insufficiency and lymphedema.  I encouraged her to continue compression, elevation, use of lymphedema pumps.  I referred her to the lymphedema physical therapist.  I do not have a clear explanation for her pain from a vascular standpoint.  She can follow-up with me as needed.  Debby SAILOR. Magda, MD [facility] Phone Number: [phone] 03/20/2023 4:06 PM

## 2023-03-20 ENCOUNTER — Encounter: Payer: Self-pay | Admitting: Vascular Surgery

## 2023-03-20 ENCOUNTER — Ambulatory Visit (HOSPITAL_COMMUNITY)
Admission: RE | Admit: 2023-03-20 | Discharge: 2023-03-20 | Disposition: A | Discharge status: Home / Self Care | Payer: Medicare Other | Source: Ambulatory Visit | Attending: Vascular Surgery | Admitting: Vascular Surgery

## 2023-03-20 ENCOUNTER — Ambulatory Visit (INDEPENDENT_AMBULATORY_CARE_PROVIDER_SITE_OTHER): Payer: Medicare Other | Admitting: Vascular Surgery

## 2023-03-20 VITALS — BP 139/80 | HR 63 | Temp 98.3°F | Resp 20 | Ht 64.0 in | Wt 235.0 lb

## 2023-03-20 DIAGNOSIS — I890 Lymphedema, not elsewhere classified: Secondary | ICD-10-CM | POA: Diagnosis not present

## 2023-03-20 DIAGNOSIS — M7989 Other specified soft tissue disorders: Secondary | ICD-10-CM | POA: Diagnosis not present

## 2023-03-20 DIAGNOSIS — I872 Venous insufficiency (chronic) (peripheral): Secondary | ICD-10-CM | POA: Diagnosis not present

## 2023-03-21 DIAGNOSIS — M1712 Unilateral primary osteoarthritis, left knee: Secondary | ICD-10-CM | POA: Diagnosis not present

## 2023-04-13 ENCOUNTER — Other Ambulatory Visit: Payer: Self-pay | Admitting: Cardiology

## 2023-04-13 DIAGNOSIS — I129 Hypertensive chronic kidney disease with stage 1 through stage 4 chronic kidney disease, or unspecified chronic kidney disease: Secondary | ICD-10-CM

## 2023-04-13 NOTE — Telephone Encounter (Signed)
 Prescription sent to pharmacy.

## 2023-04-27 DIAGNOSIS — Z23 Encounter for immunization: Secondary | ICD-10-CM | POA: Diagnosis not present

## 2023-05-08 NOTE — Progress Notes (Signed)
 Office Visit Note  Patient: [name]             Date of Birth: 09-Nov-1950           MRN: [id number]             PCP: Ollen Bowl, MD Referring: Ollen Bowl, MD Visit Date: 05/21/2023   Subjective:  Follow-up (Patient states her right eye has been drooping. Patient states on the left side of her head she will wake up with a cluster like headache. Patient states she has sores all over her and still has a low grade fever. Patient states she has pain in a couple joints in her face. Patient states there is a bone or something sticking up in the third finger in her left hand. Patient states she has stiffness in her hands. )    Discussed the use of AI scribe software for clinical note transcription with the patient, who gave verbal consent to proceed.  History of Present Illness   [name] is a 73 y.o. female here for follow up with seronegative rheumatoid arthritis, presents with persistent skin rashes and joint problems.  She experiences persistent skin rashes and joint problems, which have worsened over time. The rashes include purpura, described as 'purple vascular little looking things,' and are itchy, sometimes resembling scabies. She uses an antibiotic cream prescribed by a dermatologist for these rashes.  She has a history of rheumatoid arthritis and previously took hydroxychloroquine, which improved her C-reactive protein levels and low-grade fever but caused significant stomach issues. She is not currently on any antibiotics but uses gabapentin for peripheral neuropathy in her feet, resulting from nerve damage due to multiple back surgeries.  She has lymphedema, causing significant swelling in her legs, particularly when standing. She uses pumps twice a day to manage the lymphedema fluid, providing temporary relief from pain and swelling. She also takes Lasix as needed but has been inconsistent with its use due to concerns about bathroom access when  away from home.  She experiences eye drooping, particularly in the morning, accompanied by blurriness and dryness. She has not seen an eye doctor for this issue and uses night coverings for her eyes to aid sleep.  She experiences jaw popping, particularly on one side, and has noticed sunkenness in her jaw area. She has not had a dental implant due to financial constraints after her husband lost his job, leaving her without Secretary/administrator.  She reports low-grade fevers, clamminess, and sweating, particularly when active. She has a history of six back surgeries and experiences neuropathy and balance issues, leading to near falls. She takes tramadol for pain management and has had injections for pain relief in the past. No recent upper respiratory infections, but she mentions a persistent cough and occasional headaches. She experiences stiffness in the mornings and has not been using eye drops. No breast pain and difficulty with neck movement due to previous neck surgery.   Previous HPI 02/19/23 [name] is a 73 y.o. female here for follow up for ongoing joint pain and swelling low-grade fevers and rashes with a positive ANA with a history of rheumatoid arthritis (RA), psoriasis, and Raynaud's.  Due to minor lab abnormalities started hydroxychloroquine after last visit.  They report multiple potential side effects since starting the medication. Initially, they experienced dizziness, which has since subsided. However, they note persistent gastrointestinal upset, manifesting as diarrhea approximately 15 minutes after medication intake. They also report increased urinary frequency,  but it is unclear whether this is due to the medication or their increased water intake for reported dry mouth.   The patient also reports visual disturbances, specifically blurriness in the right eye. They acknowledge concurrent gabapentin therapy, which can cause similar symptoms, but the visual changes seem more  pronounced since starting hydroxychloroquine. They also report occasional confusion and difficulty recalling certain words, which is a new symptom.   The patient has noticed increased fatigue, but acknowledges pre-existing fatigue due to their RA. They also report persistent swelling in one leg, which has been evaluated by a vascular surgeon. They have a history of a collapsed vein and subsequent removal of the greater saphenous vein, which has led to ongoing complications and infections. They also report a rash on their scalp, which turned blue after a recent hair appointment. They have noticed changes in their stool color, which has turned orange to grayish since starting hydroxychloroquine.   She had recent right-sided carpal tunnel release surgery with Dr. Amanda Pea on November 15.   Previous HPI 01/08/2023 [name] is a 73 y.o. female here for follow up ongoing joint pain and swelling low-grade fevers and rashes with a positive ANA.  Lab test at our initial visit showed mild elevation in CRP and complement C3 otherwise unremarkable.  She went for bilateral knee steroid injection due to increased pain and swelling this was beneficial but still having problem along the medial side of the left knee.   12/06/22 [name] is a 73 y.o. female here for evaluation of positive ANA checked in association with multiple symptoms but particularly recurring low grade fevers, sweats, and decreased exertion tolerance.  She is referred from infectious disease clinic after evaluation for possible fever of unknown origin but not strictly meeting criteria and infectious workup was unremarkable.  Was treated for dental abscess was also found to have small fluid collection at previous lumbar laminectomy site unclear if significant.  She has body aches in multiple areas does have chronic arthritis but pain overall has been worse during the past 1 year.  She has noticed some new and progressive  deformities with nodules and lateral deviation in multiple fingers and toes. Also has increase in edema see some swelling in her hands in the morning swelling at the feet and ankles in the morning but more severe at the evening. Also has rashes and fingernail changes with ridges and discolored appearance.  She sees blue or dusky discoloration at the tips of the fingers this comes and goes.  Describes chronic dryness in her eyes and mouth and gets painful mouth sores intermittently.  Had some episodes with nosebleeds also saw some clots present in it. Has undergone extensive workup including CT of chest abdomen pelvis and MRI of the lumbar spine and SI joints.  Echocardiogram was unremarkable.  Persistent low-grade elevation in CRP and low positive ANA.  By review also had ANA checked in 2015 at same titer no particular underlying disease identified at the time.   Labs reviewed 07/2022 ANA 1:80 DFS   05/2013 ANA 1:80 diffuse   Review of Systems  Constitutional:  Positive for fatigue.  HENT:  Positive for mouth sores and mouth dryness.   Eyes:  Positive for dryness.  Respiratory:  Positive for shortness of breath.   Cardiovascular:  Positive for palpitations. Negative for chest pain.  Gastrointestinal:  Positive for constipation. Negative for blood in stool and diarrhea.  Endocrine: Negative for increased urination.  Genitourinary:  Negative for involuntary  urination.  Musculoskeletal:  Positive for joint pain, gait problem, joint pain, joint swelling, myalgias, muscle weakness, morning stiffness, muscle tenderness and myalgias.  Skin:  Positive for color change, rash and sensitivity to sunlight. Negative for hair loss.  Allergic/Immunologic: Positive for susceptible to infections.  Neurological:  Positive for dizziness and headaches.  Hematological:  Positive for swollen glands.  Psychiatric/Behavioral:  Positive for sleep disturbance. Negative for depressed mood. The patient is  nervous/anxious.     PMFS History:  Patient Active Problem List   Diagnosis Date Noted   Left low back pain 02/26/2023   Urinary frequency 02/19/2023   CRP elevated 01/08/2023   Positive ANA (antinuclear antibody) 12/06/2022   NASH (nonalcoholic steatohepatitis) 09/05/2022   Elevated LFTs 07/25/2022   Female stress incontinence 07/04/2022   Lesion of vulva 07/04/2022   High body temperature 07/04/2022   Cervical radiculitis 10/17/2021   Carpal tunnel syndrome on right 10/17/2021   Synovial cyst of lumbar facet joint    Raynaud disease    Migraines    Migraine headache    Hypertension    Hx of appendectomy    High cholesterol    Collagen vascular disease (HCC)    Chronic knee pain    Chronic kidney disease    Allergy    Arthritis    Chronic right-sided low back pain with right-sided sciatica 12/12/2018   Left-sided trigeminal neuralgia 06/03/2018   Trigeminal neuralgia of left side of face 04/30/2018   APC (atrial premature contractions) 02/17/2018   PAT (paroxysmal atrial tachycardia) (HCC) 02/17/2018   Palpitations 12/24/2017   Angular cheilitis 05/10/2017   Dysphonia 05/10/2017   Laryngopharyngeal reflux (LPR) 05/10/2017   Mucous retention cyst of maxillary sinus 05/10/2017   Recurrent epistaxis 05/10/2017   Upper airway cough syndrome 04/26/2017   Fibromyalgia 02/27/2017   Sepsis (HCC) 10/23/2016   Salmonella enteritis 10/23/2016   Colitis 10/21/2016   CAP (community acquired pneumonia) 10/21/2016   Fever with chills 10/21/2016   Melanoma (HCC) 11/19/2015   SVT (supraventricular tachycardia) (HCC) 05/20/2015   Chronic venous insufficiency 10/10/2013   Neuropathy 04/23/2013   Psoriasis 04/23/2013   Deformity of joint 04/23/2013   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   Chondromalacia patellae 12/02/2012   Finger injury 11/08/2012   Tear of lateral meniscus of left knee 09/30/2012   Peripheral cyanosis 09/28/2012   DJD (degenerative joint disease) of  knee 09/02/2012   Status post lumbar spinal fusion 10/02/2011   Routine health maintenance 08/17/2011   Anemia, iron deficiency 01/22/2011   Chronic back pain 01/02/2011   Pseudoarthrosis of lumbar spine 01/02/2011   Chronic pain 09/22/2010   Depression with anxiety 09/21/2010   Spinal stenosis 08/14/2010   Hypertensive chronic kidney disease 07/08/2010   Migraine 07/08/2010   Knee internal derangement 07/08/2010   Chest pain in adult 07/08/2010   Essential hypertension 07/08/2010   Derangement of knee, left 2009    Past Medical History:  Diagnosis Date   Allergy    Anemia, iron deficiency 01/22/2011   May '12 - 13.8 Hgb, Nov 2, '12 Hgb 10.9    APC (atrial premature contractions) 02/17/2018   Arthritis    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 Dulce Sellar per patient report.  Overview:  CAD risk score is low  at 3%   Chondromalacia patellae 12/02/2012   Chronic back pain    Chronic kidney disease    Chronic knee pain    Chronic pain 09/22/2010   Multiple sources of pain: radicular pain from lumbar disease and post-operative pain; neck pain from cervical disk disease; left knee pain from trauma.  Plan - will wean off prednisone           Will continue percocet at 1  5/325 norco every 6 hours prn           NSAID - trial of meloxicam 36m g once a day           Increase gabapentin slowly to 600mg  tid and reassess           Executed a pain con   Chronic venous insufficiency 10/10/2013   Colitis 10/21/2016   Collagen vascular disease (HCC)    Deformity of joint 04/23/2013   Depression with anxiety 09/21/2010   Derangement of knee, left 2009   started with fall at work. Has had repair patella and torn meniscus    Diverticulosis    DJD (degenerative joint disease) of knee 09/02/2012   Essential hypertension 07/08/2010   Long standing hypertension medically managed. Currently seeing Dr. Verdis Prime and Dr. Doy Mince ([facility]) for cardiology.  Meds  HCT only  Overview:  Overview:  Long standing hypertension medically managed. Currently seeing Dr. Verdis Prime and Dr. Dulce Sellar ([facility]) for cardiology.  Meds  HCT only  Last Assessment & Plan:  Formatting of this note may be different from the original. BP Readings   Fever chills 10/21/2016   Fibromyalgia 02/27/2017   High cholesterol    Hx of appendectomy    Hypertension    Hypertensive chronic kidney disease 07/08/2010   Long standing hypertension medically managed. Currently seeing Dr. Verdis Prime and Dr. Doy Mince ([facility]) for cardiology.  Meds  HCT only  Overview:  Overview:  Long standing hypertension medically managed. Currently seeing Dr. Verdis Prime and Dr. Dulce Sellar ([facility]) for cardiology.  Meds  HCT only  Last Assessment & Plan:  Formatting of this note may be different from the original. BP Readings   Hypothyroidism    Knee internal derangement 07/08/2010   Left knee: Feb '09 medial and lateral meniscal tears by MRI                  April '09 arthroscopic surgery    Melanoma (HCC) 11/19/2015   Migraine headache    hormonal related - less frequent off hormone replacement   Migraines    NASH (nonalcoholic steatohepatitis) 09/05/2022   Neuropathy 04/23/2013   PAT (paroxysmal atrial tachycardia) (HCC) 02/17/2018   Peripheral cyanosis 09/28/2012   Full evaluation by Dr. Azzie Roup for rheumatology - no evidence of Raynaud's syndrome or disease; no evidence of underlying rheumatologic disease or malignancy. Most probable cause is neurogenic.    Polyp of colon    Pseudoarthrosis of lumbar spine 01/02/2011   Psoriasis 04/23/2013   Raynaud disease    Routine health maintenance 08/17/2011   Colonoscopy - no record in EPIC of colonoscopy   Immunizations - Tdap June '13.     Salmonella enteritis 10/23/2016   Scabies    Sepsis (HCC) 10/23/2016   Spinal stenosis 08/14/2010   history of cervical laminectomy, lumbar diskectomy with fixation devices and redo surgery.    Status post lumbar spinal fusion 10/02/2011   SVT (supraventricular tachycardia) (HCC)    Synovial cyst of lumbar facet joint  lower back - s/p surgery    Tear of lateral meniscus of left knee 09/30/2012    Family History  Problem Relation Age of Onset   Diabetes Mother    COPD Mother    Aortic aneurysm Mother    Hypertension Mother    Hypothyroidism Mother    Cancer Father        lung   Hyperlipidemia Father    Hypertension Father    Heart disease Father    Diabetes Father    Heart attack Father    Diabetes Brother    Past Surgical History:  Procedure Laterality Date   APPENDECTOMY  1971   CARPAL TUNNEL RELEASE Right 01/26/2023   CERVICAL DISCECTOMY  1999   diskectomy with fixation:plate and screws   COLONOSCOPY     ESOPHAGOGASTRODUODENOSCOPY  12/2014   KNEE ARTHROSCOPY W/ MENISCAL REPAIR  2009   left knee   L1-S1 Fusion     L4-5 lumbar fusion  09/02/2010   Dr. Gerlene Fee:  minimally invasive transforaminal interbody fusion with spacer   OVARIAN CYST SURGERY  1972   PATELLAR REEFING  '09   repair of fractured patella after fall   SPINE SURGERY     SYNOVIAL CYST EXCISION  2007   lumbar spine   VENOUS ABLATION     for pain in the groin - [facility] did procedure. Has some residual discomfort at the  ablation site.    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 Married '71- '10/widowed; engaged. 1 dtr- '72; 1 son '73; 4 grandchildren, 2 step-grands. 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   Right handed   Lives at home with spouse    2-3 cups daily of caffeine    Immunization History  Administered  Date(s) Administered   Influenza Split 12/22/2010, 11/25/2014   Influenza, High Dose Seasonal PF 12/26/2017, 11/20/2018   Influenza,inj,Quad PF,6+ Mos 12/22/2012   Influenza-Unspecified 12/22/2010   PFIZER(Purple Top)SARS-COV-2 Vaccination 04/04/2019, 04/25/2019   Pneumococcal Conjugate-13 11/24/2014   Tdap 08/16/2011     Objective: Vital Signs: BP (!) 168/81 (BP Location: Left Arm, Patient Position: Sitting, Cuff Size: Large)   Pulse 71   Resp 16   Ht 5\' 4"  (1.626 m)   Wt 242 lb (109.8 kg)   BMI 41.54 kg/m    Physical Exam Constitutional:      Appearance: She is obese.  Eyes:     Conjunctiva/sclera: Conjunctivae normal.  Cardiovascular:     Rate and Rhythm: Normal rate and regular rhythm.  Pulmonary:     Effort: Pulmonary effort is normal.     Breath sounds: Normal breath sounds.  Musculoskeletal:     Right lower leg: No edema.     Left lower leg: No edema.  Lymphadenopathy:     Cervical: No cervical adenopathy.  Skin:    General: Skin is warm and dry.     Findings: No rash.  Neurological:     Mental Status: She is alert.  Psychiatric:        Mood and Affect: Mood normal.      Musculoskeletal Exam:  Increased muscle tone right SCM, full ROM intact with some guarding lateral rotation Elbows full ROM no tenderness or swelling Right wrist surgical scar closed and appears well-healing no tenderness no swelling Fingers with DIP Heberden's nodes bilaterally and some lateral deviation in distal fingers  No paraspinal tenderness to palpation over upper and lower back Hip normal internal and external rotation without pain, no tenderness to lateral hip palpation Left knee swelling at medial and posterior sides, posterior tenderness to pressure Ankles full ROM no tenderness or swelling MTPs full ROM no tenderness or swelling  Investigation: No additional findings.  Imaging: No results found.  Recent Labs: Lab Results  Component Value Date   WBC 7.7 05/21/2023    HGB 14.5 05/21/2023   PLT 283 05/21/2023   NA 138 05/21/2023   K 4.4 05/21/2023   CL 102 05/21/2023   CO2 27 05/21/2023   GLUCOSE 105 (H) 05/21/2023   BUN 15 05/21/2023   CREATININE 0.78 05/21/2023   BILITOT 0.4 05/21/2023   ALKPHOS 126 (H) 02/12/2022   AST 51 (H) 05/21/2023   ALT 43 (H) 05/21/2023   PROT 7.3 05/21/2023   ALBUMIN 4.4 02/12/2022   CALCIUM 9.3 05/21/2023   GFRAA 69 10/24/2018   QFTBGOLDPLUS NEGATIVE 07/25/2022    Speciality Comments: No specialty comments available.  Procedures:  No procedures performed Allergies: Epinephrine, Nsaids, Other, Pregabalin, Tolmetin, Ciprofloxacin, Parathyroid hormone (recomb), Prednisone, Tape, Tapentadol, Captopril, Lisinopril, Celecoxib, Codeine, Lidocaine hcl, Procaine, Sulfa antibiotics, and Sulfasalazine   Assessment / Plan:     Visit Diagnoses: Positive ANA (antinuclear antibody) - Plan: C3 and C4, Sedimentation rate, C-reactive protein, IgG, IgA, IgM, CBC with Differential/Platelet, COMPLETE METABOLIC PANEL WITH GFR, Thiopurine methyltransferase(tpmt)rbc Rheumatoid Arthritis Persistent joint problems and skin rashes despite discontinuation of hydroxychloroquine due to gastrointestinal side effects. Mildly abnormal liver enzymes, making methotrexate a less favorable option. -Consider starting azathioprine or minocycline as alternative treatments. -Provide patient with information handouts about these medications. -Plan to recheck inflammatory markers (CRP) and liver enzymes, checking TPMT enzyme function assay  Lymphedema Chronic issue with leg swelling, likely secondary to lymphedema. Patient uses pumps twice daily for management but reports persistent swelling. -Continue current management with pumps. -Encourage consistent use of Lasix as needed, balancing need for symptom control with patient's lifestyle and access to facilities.  Ocular Symptoms Patient reports drooping of one eye, blurriness, and dryness. Symptoms are  worse in the morning.  There is no periorbital edema or visible inflammatory appearing changes on exam.  Could also be related to secondary sicca syndrome. -Recommend evaluation by an ophthalmologist.  Knee Osteoarthritis Persistent pain and swelling despite previous injections.   Orders: Orders Placed This Encounter  Procedures   C3 and C4   Sedimentation rate   C-reactive protein   IgG, IgA, IgM   CBC with Differential/Platelet   COMPLETE METABOLIC PANEL WITH GFR   Thiopurine methyltransferase(tpmt)rbc   No orders of the defined types were placed in this encounter.    Follow-Up Instructions: Return in about 3 months (around 08/21/2023) for ?CTD ?AZA/Mino start f/u 3mos.   Fuller Plan, MD  Note - This record has been created using [employer].  Chart creation errors have been sought, but may not always  have been located. Such creation errors do not reflect on  the standard of medical care.

## 2023-05-10 DIAGNOSIS — L308 Other specified dermatitis: Secondary | ICD-10-CM | POA: Diagnosis not present

## 2023-05-10 DIAGNOSIS — Z85820 Personal history of malignant melanoma of skin: Secondary | ICD-10-CM | POA: Diagnosis not present

## 2023-05-10 DIAGNOSIS — L718 Other rosacea: Secondary | ICD-10-CM | POA: Diagnosis not present

## 2023-05-10 DIAGNOSIS — L821 Other seborrheic keratosis: Secondary | ICD-10-CM | POA: Diagnosis not present

## 2023-05-21 ENCOUNTER — Ambulatory Visit: Payer: Medicare Other | Attending: Internal Medicine | Admitting: Internal Medicine

## 2023-05-21 ENCOUNTER — Encounter: Payer: Self-pay | Admitting: Internal Medicine

## 2023-05-21 VITALS — BP 168/81 | HR 71 | Resp 16 | Ht 64.0 in | Wt 242.0 lb

## 2023-05-21 DIAGNOSIS — M359 Systemic involvement of connective tissue, unspecified: Secondary | ICD-10-CM | POA: Diagnosis not present

## 2023-05-21 DIAGNOSIS — I7300 Raynaud's syndrome without gangrene: Secondary | ICD-10-CM | POA: Insufficient documentation

## 2023-05-21 DIAGNOSIS — I872 Venous insufficiency (chronic) (peripheral): Secondary | ICD-10-CM | POA: Diagnosis not present

## 2023-05-21 DIAGNOSIS — R768 Other specified abnormal immunological findings in serum: Secondary | ICD-10-CM | POA: Insufficient documentation

## 2023-05-21 DIAGNOSIS — G629 Polyneuropathy, unspecified: Secondary | ICD-10-CM | POA: Diagnosis not present

## 2023-05-26 LAB — CBC WITH DIFFERENTIAL/PLATELET
Absolute Lymphocytes: 2171 {cells}/uL (ref 850–3900)
Absolute Monocytes: 439 {cells}/uL (ref 200–950)
Basophils Absolute: 77 {cells}/uL (ref 0–200)
Basophils Relative: 1 %
Eosinophils Absolute: 416 {cells}/uL (ref 15–500)
Eosinophils Relative: 5.4 %
HCT: 43.8 % (ref 35.0–45.0)
Hemoglobin: 14.5 g/dL (ref 11.7–15.5)
MCH: 28.8 pg (ref 27.0–33.0)
MCHC: 33.1 g/dL (ref 32.0–36.0)
MCV: 87.1 fL (ref 80.0–100.0)
MPV: 9.9 fL (ref 7.5–12.5)
Monocytes Relative: 5.7 %
Neutro Abs: 4597 {cells}/uL (ref 1500–7800)
Neutrophils Relative %: 59.7 %
Platelets: 283 10*3/uL (ref 140–400)
RBC: 5.03 10*6/uL (ref 3.80–5.10)
RDW: 13.3 % (ref 11.0–15.0)
Total Lymphocyte: 28.2 %
WBC: 7.7 10*3/uL (ref 3.8–10.8)

## 2023-05-26 LAB — COMPLETE METABOLIC PANEL WITH GFR
AG Ratio: 1.6 (calc) (ref 1.0–2.5)
ALT: 43 U/L — ABNORMAL HIGH (ref 6–29)
AST: 51 U/L — ABNORMAL HIGH (ref 10–35)
Albumin: 4.5 g/dL (ref 3.6–5.1)
Alkaline phosphatase (APISO): 115 U/L (ref 37–153)
BUN: 15 mg/dL (ref 7–25)
CO2: 27 mmol/L (ref 20–32)
Calcium: 9.3 mg/dL (ref 8.6–10.4)
Chloride: 102 mmol/L (ref 98–110)
Creat: 0.78 mg/dL (ref 0.60–1.00)
Globulin: 2.8 g/dL (ref 1.9–3.7)
Glucose, Bld: 105 mg/dL — ABNORMAL HIGH (ref 65–99)
Potassium: 4.4 mmol/L (ref 3.5–5.3)
Sodium: 138 mmol/L (ref 135–146)
Total Bilirubin: 0.4 mg/dL (ref 0.2–1.2)
Total Protein: 7.3 g/dL (ref 6.1–8.1)
eGFR: 81 mL/min/{1.73_m2} (ref >=60)

## 2023-05-26 LAB — IGG, IGA, IGM
IgG (Immunoglobin G), Serum: 1503 mg/dL (ref 600–1540)
IgM, Serum: 214 mg/dL (ref 50–300)
Immunoglobulin A: 266 mg/dL (ref 70–320)

## 2023-05-26 LAB — C3 AND C4
C3 Complement: 190 mg/dL (ref 83–193)
C4 Complement: 20 mg/dL (ref 15–57)

## 2023-05-26 LAB — C-REACTIVE PROTEIN: CRP: 11 mg/L — ABNORMAL HIGH (ref <8.0)

## 2023-05-26 LAB — THIOPURINE METHYLTRANSFERASE (TPMT), RBC: Thiopurine Methyltransferase, RBC: 15 nmol/h/mL

## 2023-05-26 LAB — SEDIMENTATION RATE: Sed Rate: 22 mm/h (ref 0–30)

## 2023-05-28 ENCOUNTER — Encounter: Payer: Self-pay | Admitting: Internal Medicine

## 2023-06-03 ENCOUNTER — Encounter: Payer: Self-pay | Admitting: Internal Medicine

## 2023-06-04 NOTE — Telephone Encounter (Signed)
 Patient contacted the office requesting her lab results and to discuss the nosebleed she had over the weekend. Patient is requesting a call back as soon as possible.

## 2023-06-05 DIAGNOSIS — I129 Hypertensive chronic kidney disease with stage 1 through stage 4 chronic kidney disease, or unspecified chronic kidney disease: Secondary | ICD-10-CM | POA: Diagnosis not present

## 2023-06-05 DIAGNOSIS — I700 Atherosclerosis of aorta: Secondary | ICD-10-CM | POA: Diagnosis not present

## 2023-06-05 DIAGNOSIS — N182 Chronic kidney disease, stage 2 (mild): Secondary | ICD-10-CM | POA: Diagnosis not present

## 2023-06-06 MED ORDER — AZATHIOPRINE 50 MG PO TABS: 50.0000 mg | ORAL_TABLET | Freq: Every day | ORAL | 1 refills | Status: DC

## 2023-06-06 NOTE — Addendum Note (Signed)
 Addended by: Fuller Plan on: 06/06/2023 04:55 PM   Modules accepted: Orders

## 2023-06-06 NOTE — Progress Notes (Signed)
 Lab results remain about the same still has a mildly elevated CRP of 11 up from 10 3 months prior.  Her liver enzyme test remain slightly elevated with AST of 51 and ALT of 43 probably consistent with known fatty liver changes.  The TPMT test was normal at 15 so would not be a high risk for side effects from the azathioprine. I recommend to try starting this at low-dose 50 mg 1 tablet daily.  This drug is used for joint inflammation as well as vasculitis but usually will take a while to see if it is working. I also recommend rechecking her blood count and metabolic panel 1 month after starting for monitoring.

## 2023-06-06 NOTE — Telephone Encounter (Signed)
 Patient contacted the office requesting her lab results and to discuss the nosebleed she had over the weekend.  Patient also has a message she sent about falling she would like addressed. Patient is requesting a call back as soon as possible.

## 2023-06-06 NOTE — Telephone Encounter (Signed)
 Lab results actually looked fairly reassuring with only mild persistent elevation in her CRP which is an inflammatory marker. Nose bleeds are not a very common effect from rheumatoid arthritis. If having repeated and prolonged bleeds she could benefit to see ENT who can see if this looks suspicious for vasculitis or more benign such as overly dry surfaces.

## 2023-06-07 ENCOUNTER — Other Ambulatory Visit: Payer: Self-pay | Admitting: *Deleted

## 2023-06-07 DIAGNOSIS — R7982 Elevated C-reactive protein (CRP): Secondary | ICD-10-CM

## 2023-06-07 DIAGNOSIS — M359 Systemic involvement of connective tissue, unspecified: Secondary | ICD-10-CM

## 2023-06-07 DIAGNOSIS — I872 Venous insufficiency (chronic) (peripheral): Secondary | ICD-10-CM

## 2023-06-07 DIAGNOSIS — G629 Polyneuropathy, unspecified: Secondary | ICD-10-CM

## 2023-06-07 DIAGNOSIS — I7300 Raynaud's syndrome without gangrene: Secondary | ICD-10-CM

## 2023-06-07 DIAGNOSIS — R768 Other specified abnormal immunological findings in serum: Secondary | ICD-10-CM

## 2023-06-07 DIAGNOSIS — Z79899 Other long term (current) drug therapy: Secondary | ICD-10-CM

## 2023-06-19 ENCOUNTER — Encounter: Payer: Self-pay | Admitting: Internal Medicine

## 2023-06-20 ENCOUNTER — Other Ambulatory Visit: Payer: Self-pay | Admitting: *Deleted

## 2023-06-20 DIAGNOSIS — R768 Other specified abnormal immunological findings in serum: Secondary | ICD-10-CM

## 2023-06-20 DIAGNOSIS — R7982 Elevated C-reactive protein (CRP): Secondary | ICD-10-CM

## 2023-06-20 DIAGNOSIS — I872 Venous insufficiency (chronic) (peripheral): Secondary | ICD-10-CM | POA: Diagnosis not present

## 2023-06-20 DIAGNOSIS — M359 Systemic involvement of connective tissue, unspecified: Secondary | ICD-10-CM

## 2023-06-20 DIAGNOSIS — G629 Polyneuropathy, unspecified: Secondary | ICD-10-CM

## 2023-06-20 DIAGNOSIS — Z79899 Other long term (current) drug therapy: Secondary | ICD-10-CM

## 2023-06-20 DIAGNOSIS — I7300 Raynaud's syndrome without gangrene: Secondary | ICD-10-CM

## 2023-06-20 NOTE — Telephone Encounter (Signed)
 Patient is referring to her Azathioprine. Please advise.

## 2023-06-21 LAB — COMPREHENSIVE METABOLIC PANEL WITH GFR
AG Ratio: 1.4 (calc) (ref 1.0–2.5)
ALT: 36 U/L — ABNORMAL HIGH (ref 6–29)
AST: 56 U/L — ABNORMAL HIGH (ref 10–35)
Albumin: 4.3 g/dL (ref 3.6–5.1)
Alkaline phosphatase (APISO): 102 U/L (ref 37–153)
BUN: 15 mg/dL (ref 7–25)
CO2: 30 mmol/L (ref 20–32)
Calcium: 9 mg/dL (ref 8.6–10.4)
Chloride: 101 mmol/L (ref 98–110)
Creat: 0.93 mg/dL (ref 0.60–1.00)
Globulin: 3 g/dL (ref 1.9–3.7)
Glucose, Bld: 104 mg/dL — ABNORMAL HIGH (ref 65–99)
Potassium: 4.5 mmol/L (ref 3.5–5.3)
Sodium: 138 mmol/L (ref 135–146)
Total Bilirubin: 0.4 mg/dL (ref 0.2–1.2)
Total Protein: 7.3 g/dL (ref 6.1–8.1)
eGFR: 65 mL/min/{1.73_m2} (ref >=60)

## 2023-06-26 ENCOUNTER — Encounter: Payer: Self-pay | Admitting: Internal Medicine

## 2023-07-02 DIAGNOSIS — D241 Benign neoplasm of right breast: Secondary | ICD-10-CM | POA: Diagnosis not present

## 2023-07-06 NOTE — Telephone Encounter (Signed)
 Patient contacted the office stating she has reached out multiple times and has not gotten a response. Patient states she would really like an answer as to what to do. She is having increased pain with her RA. Please advise.

## 2023-07-11 DIAGNOSIS — E039 Hypothyroidism, unspecified: Secondary | ICD-10-CM | POA: Diagnosis not present

## 2023-07-11 DIAGNOSIS — N182 Chronic kidney disease, stage 2 (mild): Secondary | ICD-10-CM | POA: Diagnosis not present

## 2023-07-11 DIAGNOSIS — I129 Hypertensive chronic kidney disease with stage 1 through stage 4 chronic kidney disease, or unspecified chronic kidney disease: Secondary | ICD-10-CM | POA: Diagnosis not present

## 2023-07-11 DIAGNOSIS — M159 Polyosteoarthritis, unspecified: Secondary | ICD-10-CM | POA: Diagnosis not present

## 2023-07-11 DIAGNOSIS — I700 Atherosclerosis of aorta: Secondary | ICD-10-CM | POA: Diagnosis not present

## 2023-07-27 DIAGNOSIS — N182 Chronic kidney disease, stage 2 (mild): Secondary | ICD-10-CM | POA: Diagnosis not present

## 2023-07-27 DIAGNOSIS — I129 Hypertensive chronic kidney disease with stage 1 through stage 4 chronic kidney disease, or unspecified chronic kidney disease: Secondary | ICD-10-CM | POA: Diagnosis not present

## 2023-07-27 DIAGNOSIS — I700 Atherosclerosis of aorta: Secondary | ICD-10-CM | POA: Diagnosis not present

## 2023-08-11 DIAGNOSIS — E039 Hypothyroidism, unspecified: Secondary | ICD-10-CM | POA: Diagnosis not present

## 2023-08-11 DIAGNOSIS — M159 Polyosteoarthritis, unspecified: Secondary | ICD-10-CM | POA: Diagnosis not present

## 2023-08-11 DIAGNOSIS — I129 Hypertensive chronic kidney disease with stage 1 through stage 4 chronic kidney disease, or unspecified chronic kidney disease: Secondary | ICD-10-CM | POA: Diagnosis not present

## 2023-08-21 ENCOUNTER — Ambulatory Visit: Attending: Internal Medicine | Admitting: Internal Medicine

## 2023-08-21 ENCOUNTER — Encounter: Payer: Self-pay | Admitting: Internal Medicine

## 2023-08-21 ENCOUNTER — Ambulatory Visit

## 2023-08-21 VITALS — BP 137/83 | HR 72 | Resp 14 | Ht 65.0 in | Wt 247.0 lb

## 2023-08-21 DIAGNOSIS — K7581 Nonalcoholic steatohepatitis (NASH): Secondary | ICD-10-CM | POA: Insufficient documentation

## 2023-08-21 DIAGNOSIS — Z79899 Other long term (current) drug therapy: Secondary | ICD-10-CM | POA: Diagnosis not present

## 2023-08-21 DIAGNOSIS — R0781 Pleurodynia: Secondary | ICD-10-CM | POA: Insufficient documentation

## 2023-08-21 DIAGNOSIS — M1990 Unspecified osteoarthritis, unspecified site: Secondary | ICD-10-CM | POA: Diagnosis not present

## 2023-08-21 DIAGNOSIS — N189 Chronic kidney disease, unspecified: Secondary | ICD-10-CM | POA: Diagnosis not present

## 2023-08-21 DIAGNOSIS — R768 Other specified abnormal immunological findings in serum: Secondary | ICD-10-CM | POA: Insufficient documentation

## 2023-08-21 DIAGNOSIS — M797 Fibromyalgia: Secondary | ICD-10-CM | POA: Insufficient documentation

## 2023-08-21 NOTE — Progress Notes (Unsigned)
 Office Visit Note  Patient: [name]             Date of Birth: [birth year]           MRN: [id number]             PCP: Elester Grim, MD Referring: Elester Grim, MD Visit Date: 08/21/2023   Subjective:  Follow-up (Patient states she sent a message a while back regarding this. Patient states she stopped her Azathioprine  because she was advised to by another doctor and had labs done shortly after that. Patient states she is not doing good and something needs to be figured out. )   History of Present Illness: [name] is a 73 y.o. female here for follow up ***    Maybe PT for rib pain not resolving if no fracture? ***   Previous HPI 05/21/23 [name] is a 73 y.o. female here for follow up with seronegative rheumatoid arthritis, presents with persistent skin rashes and joint problems.   She experiences persistent skin rashes and joint problems, which have worsened over time. The rashes include purpura, described as 'purple vascular little looking things,' and are itchy, sometimes resembling scabies. She uses an antibiotic cream prescribed by a dermatologist for these rashes.   She has a history of rheumatoid arthritis and previously took hydroxychloroquine , which improved her C-reactive protein levels and low-grade fever but caused significant stomach issues. She is not currently on any antibiotics but uses gabapentin  for peripheral neuropathy in her feet, resulting from nerve damage due to multiple back surgeries.   She has lymphedema, causing significant swelling in her legs, particularly when standing. She uses pumps twice a day to manage the lymphedema fluid, providing temporary relief from pain and swelling. She also takes Lasix  as needed but has been inconsistent with its use due to concerns about bathroom access when away from home.   She experiences eye drooping, particularly in the morning, accompanied by blurriness and dryness. She  has not seen an eye doctor for this issue and uses night coverings for her eyes to aid sleep.   She experiences jaw popping, particularly on one side, and has noticed sunkenness in her jaw area. She has not had a dental implant due to financial constraints after her husband lost his job, leaving her without Secretary/administrator.   She reports low-grade fevers, clamminess, and sweating, particularly when active. She has a history of six back surgeries and experiences neuropathy and balance issues, leading to near falls. She takes tramadol  for pain management and has had injections for pain relief in the past. No recent upper respiratory infections, but she mentions a persistent cough and occasional headaches. She experiences stiffness in the mornings and has not been using eye drops. No breast pain and difficulty with neck movement due to previous neck surgery.    Previous HPI 02/19/23 [name] is a 73 y.o. female here for follow up for ongoing joint pain and swelling low-grade fevers and rashes with a positive ANA with a history of rheumatoid arthritis (RA), psoriasis, and Raynaud's.  Due to minor lab abnormalities started hydroxychloroquine  after last visit.  They report multiple potential side effects since starting the medication. Initially, they experienced dizziness, which has since subsided. However, they note persistent gastrointestinal upset, manifesting as diarrhea approximately 15 minutes after medication intake. They also report increased urinary frequency, but it is unclear whether this is due to the medication or their increased water intake for reported  dry mouth.   The patient also reports visual disturbances, specifically blurriness in the right eye. They acknowledge concurrent gabapentin  therapy, which can cause similar symptoms, but the visual changes seem more pronounced since starting hydroxychloroquine . They also report occasional confusion and difficulty recalling certain  words, which is a new symptom.   The patient has noticed increased fatigue, but acknowledges pre-existing fatigue due to their RA. They also report persistent swelling in one leg, which has been evaluated by a vascular surgeon. They have a history of a collapsed vein and subsequent removal of the greater saphenous vein, which has led to ongoing complications and infections. They also report a rash on their scalp, which turned blue after a recent hair appointment. They have noticed changes in their stool color, which has turned orange to grayish since starting hydroxychloroquine .   She had recent right-sided carpal tunnel release surgery with Dr. Aloha Arnold on November 15.   Previous HPI 01/08/2023 [name] is a 73 y.o. female here for follow up ongoing joint pain and swelling low-grade fevers and rashes with a positive ANA.  Lab test at our initial visit showed mild elevation in CRP and complement C3 otherwise unremarkable.  She went for bilateral knee steroid injection due to increased pain and swelling this was beneficial but still having problem along the medial side of the left knee.   12/06/22 [name] is a 73 y.o. female here for evaluation of positive ANA checked in association with multiple symptoms but particularly recurring low grade fevers, sweats, and decreased exertion tolerance.  She is referred from infectious disease clinic after evaluation for possible fever of unknown origin but not strictly meeting criteria and infectious workup was unremarkable.  Was treated for dental abscess was also found to have small fluid collection at previous lumbar laminectomy site unclear if significant.  She has body aches in multiple areas does have chronic arthritis but pain overall has been worse during the past 1 year.  She has noticed some new and progressive deformities with nodules and lateral deviation in multiple fingers and toes. Also has increase in edema see some swelling in  her hands in the morning swelling at the feet and ankles in the morning but more severe at the evening. Also has rashes and fingernail changes with ridges and discolored appearance.  She sees blue or dusky discoloration at the tips of the fingers this comes and goes.  Describes chronic dryness in her eyes and mouth and gets painful mouth sores intermittently.  Had some episodes with nosebleeds also saw some clots present in it. Has undergone extensive workup including CT of chest abdomen pelvis and MRI of the lumbar spine and SI joints.  Echocardiogram was unremarkable.  Persistent low-grade elevation in CRP and low positive ANA.  By review also had ANA checked in 2015 at same titer no particular underlying disease identified at the time.   Labs reviewed 07/2022 ANA 1:80 DFS   05/2013 ANA 1:80 diffuse   Review of Systems  Constitutional:  Positive for fatigue.  HENT:  Positive for mouth dryness. Negative for mouth sores.   Eyes:  Positive for dryness.  Respiratory:  Positive for shortness of breath.   Cardiovascular:  Positive for chest pain. Negative for palpitations.  Gastrointestinal:  Negative for blood in stool, constipation and diarrhea.  Endocrine: Negative for increased urination.  Genitourinary:  Positive for involuntary urination.  Musculoskeletal:  Positive for joint pain, gait problem, joint pain, joint swelling, myalgias, muscle weakness, morning stiffness,  muscle tenderness and myalgias.  Skin:  Positive for color change, rash and sensitivity to sunlight. Negative for hair loss.  Allergic/Immunologic: Positive for susceptible to infections.  Neurological:  Positive for dizziness. Negative for headaches.  Hematological:  Negative for swollen glands.  Psychiatric/Behavioral:  Positive for sleep disturbance. Negative for depressed mood. The patient is not nervous/anxious.     PMFS History:  Patient Active Problem List   Diagnosis Date Noted   Left low back pain 02/26/2023    Urinary frequency 02/19/2023   CRP elevated 01/08/2023   Positive ANA (antinuclear antibody) 12/06/2022   NASH (nonalcoholic steatohepatitis) 09/05/2022   Elevated LFTs 07/25/2022   Female stress incontinence 07/04/2022   Lesion of vulva 07/04/2022   High body temperature 07/04/2022   Cervical radiculitis 10/17/2021   Carpal tunnel syndrome on right 10/17/2021   Synovial cyst of lumbar facet joint    Raynaud disease    Migraines    Migraine headache    Hypertension    Hx of appendectomy    High cholesterol    Collagen vascular disease (HCC)    Chronic knee pain    Chronic kidney disease    Allergy     Arthritis    Chronic right-sided low back pain with right-sided sciatica 12/12/2018   Left-sided trigeminal neuralgia 06/03/2018   Trigeminal neuralgia of left side of face 04/30/2018   APC (atrial premature contractions) 02/17/2018   PAT (paroxysmal atrial tachycardia) (HCC) 02/17/2018   Palpitations 12/24/2017   Angular cheilitis 05/10/2017   Dysphonia 05/10/2017   Laryngopharyngeal reflux (LPR) 05/10/2017   Mucous retention cyst of maxillary sinus 05/10/2017   Recurrent epistaxis 05/10/2017   Upper airway cough syndrome 04/26/2017   Fibromyalgia 02/27/2017   Sepsis (HCC) 10/23/2016   Salmonella enteritis 10/23/2016   Colitis 10/21/2016   CAP (community acquired pneumonia) 10/21/2016   Fever with chills 10/21/2016   Melanoma (HCC) 11/19/2015   SVT (supraventricular tachycardia) (HCC) 05/20/2015   Chronic venous insufficiency 10/10/2013   Neuropathy 04/23/2013   Psoriasis 04/23/2013   Deformity of joint 04/23/2013   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   Chondromalacia patellae 12/02/2012   Finger injury 11/08/2012   Tear of lateral meniscus of left knee 09/30/2012   Peripheral cyanosis 09/28/2012   DJD (degenerative joint disease) of knee 09/02/2012   Status post lumbar spinal fusion 10/02/2011   Routine health maintenance 08/17/2011   Anemia, iron  deficiency 01/22/2011   Chronic back pain 01/02/2011   Pseudoarthrosis of lumbar spine 01/02/2011   Chronic pain 09/22/2010   Depression with anxiety 09/21/2010   Spinal stenosis 08/14/2010   Hypertensive chronic kidney disease 07/08/2010   Migraine 07/08/2010   Knee internal derangement 07/08/2010   Chest pain in adult 07/08/2010   Essential hypertension 07/08/2010   Derangement of knee, left 2009    Past Medical History:  Diagnosis Date   Allergy     Anemia, iron deficiency 01/22/2011   May '12 - 13.8 Hgb, Nov 2, '12 Hgb 10.9    APC (atrial premature contractions) 02/17/2018   Arthritis    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 Sandee Crook per patient report.  Overview:  CAD risk score is low at 3%   Chondromalacia patellae 12/02/2012   Chronic back pain    Chronic kidney  disease    Chronic knee pain    Chronic pain 09/22/2010   Multiple sources of pain: radicular pain from lumbar disease and post-operative pain; neck pain from cervical disk disease; left knee pain from trauma.  Plan - will wean off prednisone           Will continue percocet at 1  5/325 norco every 6 hours prn           NSAID - trial of meloxicam  82m g once a day           Increase gabapentin  slowly to 600mg  tid and reassess           Executed a pain con   Chronic venous insufficiency 10/10/2013   Colitis 10/21/2016   Collagen vascular disease (HCC)    Deformity of joint 04/23/2013   Depression with anxiety 09/21/2010   Derangement of knee, left 2009   started with fall at work. Has had repair patella and torn meniscus   Diverticulosis    DJD (degenerative joint disease) of knee 09/02/2012   Essential hypertension 07/08/2010   Long standing  hypertension medically managed. Currently seeing Dr. Kay Parson and Dr. Carren Civatte ([facility]) for cardiology.  Meds  HCT only  Overview:  Overview:  Long standing hypertension medically managed. Currently seeing Dr. Kay Parson and Dr. Sandee Crook ([facility]) for cardiology.  Meds  HCT only  Last Assessment & Plan:  Formatting of this note may be different from the original. BP Readings   Fever chills 10/21/2016   Fibromyalgia 02/27/2017   High cholesterol    Hx of appendectomy    Hypertension    Hypertensive chronic kidney disease 07/08/2010   Long standing hypertension medically managed. Currently seeing Dr. Kay Parson and Dr. Carren Civatte ([facility]) for cardiology.  Meds  HCT only  Overview:  Overview:  Long standing hypertension medically managed. Currently seeing Dr. Kay Parson and Dr. Sandee Crook ([facility]) for cardiology.  Meds  HCT only  Last Assessment & Plan:  Formatting of this note may be different from the original. BP Readings   Hypothyroidism    Knee internal derangement 07/08/2010   Left knee: Feb '09 medial and lateral meniscal tears by MRI                  April '09 arthroscopic surgery    Melanoma (HCC) 11/19/2015   Migraine headache    hormonal related - less frequent off hormone replacement   Migraines    NASH (nonalcoholic steatohepatitis) 09/05/2022   Neuropathy 04/23/2013   PAT (paroxysmal atrial tachycardia) (HCC) 02/17/2018   Peripheral cyanosis 09/28/2012   Full evaluation by Dr. Imogene Mana for rheumatology - no evidence of Raynaud's syndrome or disease; no evidence of underlying rheumatologic disease or malignancy. Most probable cause is neurogenic.    Polyp of colon    Pseudoarthrosis of lumbar spine 01/02/2011   Psoriasis 04/23/2013   Raynaud disease    Routine health maintenance 08/17/2011   Colonoscopy - no record in EPIC of colonoscopy  Immunizations - Tdap June '13.     Salmonella enteritis 10/23/2016   Scabies    Sepsis (HCC) 10/23/2016   Spinal stenosis  08/14/2010   history of cervical laminectomy, lumbar diskectomy with fixation devices and redo surgery.    Status post lumbar spinal fusion 10/02/2011   SVT (supraventricular tachycardia) (HCC)    Synovial cyst of lumbar facet joint    lower back - s/p surgery    Tear of lateral meniscus of left knee 09/30/2012  Family History  Problem Relation Age of Onset   Diabetes Mother    COPD Mother    Aortic aneurysm Mother    Hypertension Mother    Hypothyroidism Mother    Cancer Father        lung   Hyperlipidemia Father    Hypertension Father    Heart disease Father    Diabetes Father    Heart attack Father    Diabetes Brother    Past Surgical History:  Procedure Laterality Date   APPENDECTOMY  1971   CARPAL TUNNEL RELEASE Right 01/26/2023   CERVICAL DISCECTOMY  1999   diskectomy with fixation:plate and screws   COLONOSCOPY     ESOPHAGOGASTRODUODENOSCOPY  12/2014   KNEE ARTHROSCOPY W/ MENISCAL REPAIR  2009   left knee   L1-S1 Fusion     L4-5 lumbar fusion  09/02/2010   Dr. Selestino Dakin:  minimally invasive transforaminal interbody fusion with spacer   OVARIAN CYST SURGERY  1972   PATELLAR REEFING  '09   repair of fractured patella after fall   SPINE SURGERY     SYNOVIAL CYST EXCISION  2007   lumbar spine   VENOUS ABLATION     for pain in the groin - VVTS did procedure. Has some residual discomfort at the  ablation site.    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 Married '71- '10/widowed; engaged. 1 dtr- '72; 1 son '73; 4 grandchildren, 2 step-grands. 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   Right handed   Lives at home with spouse    2-3 cups daily of caffeine    Immunization History  Administered Date(s) Administered   Influenza Split 12/22/2010, 11/25/2014   Influenza, High Dose Seasonal PF 12/26/2017, 11/20/2018    Influenza,inj,Quad PF,6+ Mos 12/22/2012   Influenza-Unspecified 12/22/2010   PFIZER(Purple Top)SARS-COV-2 Vaccination 04/04/2019, 04/25/2019   Pneumococcal Conjugate-13 11/24/2014   Tdap 08/16/2011     Objective: Vital Signs: BP (!) 143/86 (BP Location: Right Arm, Patient Position: Sitting, Cuff Size: Large)   Pulse 74   Resp 14   Ht 5\' 5"  (1.651 m)   Wt 247 lb (112 kg)   BMI 41.10 kg/m    Physical Exam   Musculoskeletal Exam: ***  CDAI Exam: CDAI Score: -- Patient Global: --; Provider Global: -- Swollen: --; Tender: -- Joint Exam 08/21/2023   No joint exam has been documented for this visit   There is currently no information documented on the homunculus. Go to the Rheumatology activity and complete the homunculus joint exam.  Investigation: No additional findings.  Imaging: No results found.  Recent Labs: Lab Results  Component Value Date   WBC 7.7 05/21/2023   HGB 14.5 05/21/2023   PLT 283 05/21/2023   NA 138 06/20/2023   K 4.5 06/20/2023   CL 101 06/20/2023   CO2 30 06/20/2023   GLUCOSE 104 (H) 06/20/2023   BUN 15 06/20/2023   CREATININE 0.93 06/20/2023   BILITOT 0.4 06/20/2023   ALKPHOS 126 (H) 02/12/2022   AST 56 (H) 06/20/2023   ALT 36 (H) 06/20/2023   PROT 7.3 06/20/2023   ALBUMIN 4.4 02/12/2022   CALCIUM 9.0 06/20/2023   GFRAA 69 10/24/2018   QFTBGOLDPLUS NEGATIVE 07/25/2022    Speciality Comments: No specialty comments available.  Procedures:  No procedures performed Allergies: Epinephrine, Nsaids, Other, Pregabalin, Tolmetin, Ciprofloxacin , Parathyroid   hormone (recomb), Prednisone, Tape, Tapentadol, Captopril, Lisinopril , Celecoxib, Codeine, Lidocaine  hcl, Procaine, Sulfa antibiotics, and Sulfasalazine   Assessment / Plan:     Visit Diagnoses: No diagnosis found.  ***  Orders: No orders of the defined types were placed in this encounter.  No orders of the defined types were placed in this encounter.    Follow-Up Instructions: No  follow-ups on file.   Matt Song, MD  Note - This record has been created using [employer].  Chart creation errors have been sought, but may not always  have been located. Such creation errors do not reflect on  the standard of medical care.

## 2023-08-22 ENCOUNTER — Telehealth: Payer: Self-pay | Admitting: Internal Medicine

## 2023-08-22 LAB — CBC WITH DIFFERENTIAL/PLATELET
Absolute Lymphocytes: 2313 {cells}/uL (ref 850–3900)
Absolute Monocytes: 585 {cells}/uL (ref 200–950)
Basophils Absolute: 72 {cells}/uL (ref 0–200)
Basophils Relative: 0.8 %
Eosinophils Absolute: 450 {cells}/uL (ref 15–500)
Eosinophils Relative: 5 %
HCT: 44.5 % (ref 35.0–45.0)
Hemoglobin: 14.5 g/dL (ref 11.7–15.5)
MCH: 28.8 pg (ref 27.0–33.0)
MCHC: 32.6 g/dL (ref 32.0–36.0)
MCV: 88.5 fL (ref 80.0–100.0)
MPV: 9.6 fL (ref 7.5–12.5)
Monocytes Relative: 6.5 %
Neutro Abs: 5580 {cells}/uL (ref 1500–7800)
Neutrophils Relative %: 62 %
Platelets: 309 10*3/uL (ref 140–400)
RBC: 5.03 10*6/uL (ref 3.80–5.10)
RDW: 13.8 % (ref 11.0–15.0)
Total Lymphocyte: 25.7 %
WBC: 9 10*3/uL (ref 3.8–10.8)

## 2023-08-22 LAB — COMPREHENSIVE METABOLIC PANEL WITH GFR
AG Ratio: 1.5 (calc) (ref 1.0–2.5)
ALT: 48 U/L — ABNORMAL HIGH (ref 6–29)
AST: 59 U/L — ABNORMAL HIGH (ref 10–35)
Albumin: 4.5 g/dL (ref 3.6–5.1)
Alkaline phosphatase (APISO): 113 U/L (ref 37–153)
BUN: 13 mg/dL (ref 7–25)
CO2: 28 mmol/L (ref 20–32)
Calcium: 9.6 mg/dL (ref 8.6–10.4)
Chloride: 102 mmol/L (ref 98–110)
Creat: 0.89 mg/dL (ref 0.60–1.00)
Globulin: 3.1 g/dL (ref 1.9–3.7)
Glucose, Bld: 94 mg/dL (ref 65–99)
Potassium: 4.8 mmol/L (ref 3.5–5.3)
Sodium: 138 mmol/L (ref 135–146)
Total Bilirubin: 0.5 mg/dL (ref 0.2–1.2)
Total Protein: 7.6 g/dL (ref 6.1–8.1)
eGFR: 69 mL/min/{1.73_m2} (ref >=60)

## 2023-08-22 LAB — C3 AND C4
C3 Complement: 205 mg/dL — ABNORMAL HIGH (ref 83–193)
C4 Complement: 21 mg/dL (ref 15–57)

## 2023-08-22 LAB — C-REACTIVE PROTEIN: CRP: 15 mg/L — ABNORMAL HIGH (ref <8.0)

## 2023-08-22 NOTE — Telephone Encounter (Signed)
 Patient called wanting Dr. Rodell Citrin to call her at [phone] to go over X-RAYS with her.

## 2023-08-23 ENCOUNTER — Ambulatory Visit: Payer: Self-pay | Admitting: Internal Medicine

## 2023-08-23 MED ORDER — MINOCYCLINE HCL 100 MG PO CAPS: 100.0000 mg | ORAL_CAPSULE | Freq: Every day | ORAL | 2 refills | Status: AC

## 2023-08-23 MED ORDER — MELOXICAM 15 MG PO TABS: 15.0000 mg | ORAL_TABLET | Freq: Every day | ORAL | 1 refills | Status: AC | PRN

## 2023-08-23 NOTE — Telephone Encounter (Signed)
 Now addressed in associated result note

## 2023-08-23 NOTE — Progress Notes (Signed)
 Discussed results showing increase in inflammatory markers again off any maintenance DMARD. Mildly higher CRP of 15 and complement C3 of 205. LFts remain mildly elevated but appear to be her baseline with NASH Hx Will start minocycline 100 mg as planned. Muscle relaxers, mobic , hot and cold packs for chest wall pain and will refer for orthopedic evaluation to r/o more serious injury although xray negative.

## 2023-08-28 ENCOUNTER — Encounter: Payer: Self-pay | Admitting: Physician Assistant

## 2023-08-28 ENCOUNTER — Ambulatory Visit (INDEPENDENT_AMBULATORY_CARE_PROVIDER_SITE_OTHER): Admitting: Physician Assistant

## 2023-08-28 DIAGNOSIS — R0781 Pleurodynia: Secondary | ICD-10-CM | POA: Diagnosis not present

## 2023-08-28 NOTE — Progress Notes (Signed)
 Office Visit Note   Patient: [name]           Date of Birth: [birth year]           MRN: [id number] Visit Date: 08/28/2023              Requested by: Matt Song, MD [address] [place],  [place] [address] PCP: Elester Grim, MD   Assessment & Plan: Visit Diagnoses:  1. Rib pain on left side     Plan: Patient is a 73 year old woman referral from Dr. Rodell Citrin.  She has a history of RA and is followed by Dr. Rodell Citrin.  3 months ago she had a twisting fall to her mid torso.  Since then she has had pain in the posterior left lower rib cage.  Did have x-rays which it does not demonstrate a fracture.  She has been started on antibiotics and anti-inflammatories by Dr. Rodell Citrin in the last week.  She notices the pain especially when she is twisting a certain way.  She acknowledges that she feels like overall she is having more swelling in her abdomen and just generalized peripheral edema.  Could have costochondritis with think the anti-inflammatory would help more.  From an orthopedic standpoint I do think therapy might be helpful but she does not want to do this right now.  Given her other symptoms of generalized abdominal swelling and tightness some intermittent shortness of breath I think it would be prudent for her to visit with her primary care which she has scheduled next week to rule out any other cause.  Could consider possibly an ultrasound of the area for more definitive diagnosis but I do not think this would change treatment  Follow-Up Instructions: Return if symptoms worsen or fail to improve.   Orders:  No orders of the defined types were placed in this encounter.  No orders of the defined types were placed in this encounter.     Procedures: No procedures performed   Clinical Data: No additional findings.   Subjective: No chief complaint on file.   HPI pleasant 73 year old woman who is status post twisting injury to her mid torso back in  March.  Since then has had pain with twisting her torso in particular ways feels like a knife is stabbing her especially in the left posterior ribs.  She does have a history of multiple back surgeries she denies any paresthesias that are different.  Denies any change in strength.  Denies any bruising at the time of the injury.  Denies any shortness of breath at time of the injury today she has followed by Dr. Rodell Citrin as well  Review of Systems  All other systems reviewed and are negative.    Objective: Vital Signs: There were no vitals taken for this visit.  Physical Exam Constitutional:      Appearance: Normal appearance.  Pulmonary:     Effort: Pulmonary effort is normal.   Skin:    General: Skin is warm and dry.   Neurological:     General: No focal deficit present.     Mental Status: She is alert and oriented to person, place, and time.   Psychiatric:        Mood and Affect: Mood normal.        Behavior: Behavior normal.     Ortho Exam Examination of the ribs she has good excursion with deep breath.  She has tenderness focally over the posterior lower rib cage  no surrounding bruising or fluctuance.  She has a well-healed surgical incision from previous surgeries vascular at her baseline Specialty Comments:  No specialty comments available.  Imaging: No results found.   PMFS History: Patient Active Problem List   Diagnosis Date Noted   Rib pain on left side 08/21/2023   High risk medication use 08/21/2023   Left low back pain 02/26/2023   Urinary frequency 02/19/2023   CRP elevated 01/08/2023   Positive ANA (antinuclear antibody) 12/06/2022   NASH (nonalcoholic steatohepatitis) 09/05/2022   Elevated LFTs 07/25/2022   Female stress incontinence 07/04/2022   Lesion of vulva 07/04/2022   High body temperature 07/04/2022   Cervical radiculitis 10/17/2021   Carpal tunnel syndrome on right 10/17/2021   Synovial cyst of lumbar facet joint    Raynaud disease     Migraines    Migraine headache    Hypertension    Hx of appendectomy    High cholesterol    Collagen vascular disease (HCC)    Chronic knee pain    Chronic kidney disease    Allergy     Arthritis    Chronic right-sided low back pain with right-sided sciatica 12/12/2018   Left-sided trigeminal neuralgia 06/03/2018   Trigeminal neuralgia of left side of face 04/30/2018   APC (atrial premature contractions) 02/17/2018   PAT (paroxysmal atrial tachycardia) (HCC) 02/17/2018   Palpitations 12/24/2017   Angular cheilitis 05/10/2017   Dysphonia 05/10/2017   Laryngopharyngeal reflux (LPR) 05/10/2017   Mucous retention cyst of maxillary sinus 05/10/2017   Recurrent epistaxis 05/10/2017   Upper airway cough syndrome 04/26/2017   Fibromyalgia 02/27/2017   Sepsis (HCC) 10/23/2016   Salmonella enteritis 10/23/2016   Colitis 10/21/2016   CAP (community acquired pneumonia) 10/21/2016   Fever with chills 10/21/2016   Melanoma (HCC) 11/19/2015   SVT (supraventricular tachycardia) (HCC) 05/20/2015   Chronic venous insufficiency 10/10/2013   Neuropathy 04/23/2013   Psoriasis 04/23/2013   Deformity of joint 04/23/2013   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   Chondromalacia patellae 12/02/2012   Finger injury 11/08/2012   Tear of lateral meniscus of left knee 09/30/2012   Peripheral cyanosis 09/28/2012   DJD (degenerative joint disease) of knee 09/02/2012   Status post lumbar spinal fusion 10/02/2011   Routine health maintenance 08/17/2011   Anemia, iron deficiency 01/22/2011   Chronic back pain 01/02/2011   Pseudoarthrosis of lumbar spine 01/02/2011   Chronic pain 09/22/2010   Depression with anxiety 09/21/2010   Spinal stenosis 08/14/2010   Hypertensive chronic kidney disease 07/08/2010   Migraine 07/08/2010   Knee internal derangement 07/08/2010   Chest pain in adult 07/08/2010   Essential hypertension 07/08/2010   Derangement of knee, left 2009   Past Medical History:   Diagnosis Date   Allergy     Anemia, iron deficiency 01/22/2011   May '12 - 13.8 Hgb, Nov 2, '12 Hgb 10.9    APC (atrial premature contractions) 02/17/2018   Arthritis    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 Sandee Crook per patient report.  Overview:  CAD risk score is low at 3%   Chondromalacia patellae 12/02/2012   Chronic back pain    Chronic kidney disease    Chronic knee pain    Chronic pain 09/22/2010   Multiple sources  of pain: radicular pain from lumbar disease and post-operative pain; neck pain from cervical disk disease; left knee pain from trauma.  Plan - will wean off prednisone           Will continue percocet at 1  5/325 norco every 6 hours prn           NSAID - trial of meloxicam  26m g once a day           Increase gabapentin  slowly to 600mg  tid and reassess           Executed a pain con   Chronic venous insufficiency 10/10/2013   Colitis 10/21/2016   Collagen vascular disease (HCC)    Deformity of joint 04/23/2013   Depression with anxiety 09/21/2010   Derangement of knee, left 2009   started with fall at work. Has had repair patella and torn meniscus   Diverticulosis    DJD (degenerative joint disease) of knee 09/02/2012   Essential hypertension 07/08/2010   Long standing hypertension medically managed. Currently seeing Dr. Kay Parson and Dr. Carren Civatte ([facility]) for cardiology.  Meds  HCT only  Overview:  Overview:  Long standing hypertension medically managed. Currently seeing Dr. Kay Parson and Dr. Sandee Crook ([facility]) for cardiology.  Meds  HCT only  Last Assessment & Plan:  Formatting of this note may be different from the original. BP Readings   Fever chills 10/21/2016   Fibromyalgia 02/27/2017    High cholesterol    Hx of appendectomy    Hypertension    Hypertensive chronic kidney disease 07/08/2010   Long standing hypertension medically managed. Currently seeing Dr. Kay Parson and Dr. Carren Civatte ([facility]) for cardiology.  Meds  HCT only  Overview:  Overview:  Long standing hypertension medically managed. Currently seeing Dr. Kay Parson and Dr. Sandee Crook ([facility]) for cardiology.  Meds  HCT only  Last Assessment & Plan:  Formatting of this note may be different from the original. BP Readings   Hypothyroidism    Knee internal derangement 07/08/2010   Left knee: Feb '09 medial and lateral meniscal tears by MRI                  April '09 arthroscopic surgery    Melanoma (HCC) 11/19/2015   Migraine headache    hormonal related - less frequent off hormone replacement   Migraines    NASH (nonalcoholic steatohepatitis) 09/05/2022   Neuropathy 04/23/2013   PAT (paroxysmal atrial tachycardia) (HCC) 02/17/2018   Peripheral cyanosis 09/28/2012   Full evaluation by Dr. Imogene Mana for rheumatology - no evidence of Raynaud's syndrome or disease; no evidence of underlying rheumatologic disease or malignancy. Most probable cause is neurogenic.    Polyp of colon    Pseudoarthrosis of lumbar spine 01/02/2011   Psoriasis 04/23/2013   Raynaud disease    Routine health maintenance 08/17/2011   Colonoscopy - no record in EPIC of colonoscopy  Immunizations - Tdap June '13.     Salmonella enteritis 10/23/2016   Scabies    Sepsis (HCC) 10/23/2016   Spinal stenosis 08/14/2010   history of cervical laminectomy, lumbar diskectomy with fixation devices and redo surgery.    Status post lumbar spinal fusion 10/02/2011   SVT (supraventricular tachycardia) (HCC)    Synovial cyst of lumbar facet joint    lower back - s/p surgery    Tear of lateral meniscus of left knee 09/30/2012    Family History  Problem Relation Age of Onset   Diabetes Mother  COPD Mother    Aortic aneurysm Mother     Hypertension Mother    Hypothyroidism Mother    Cancer Father        lung   Hyperlipidemia Father    Hypertension Father    Heart disease Father    Diabetes Father    Heart attack Father    Diabetes Brother     Past Surgical History:  Procedure Laterality Date   APPENDECTOMY  1971   CARPAL TUNNEL RELEASE Right 01/26/2023   CERVICAL DISCECTOMY  1999   diskectomy with fixation:plate and screws   COLONOSCOPY     ESOPHAGOGASTRODUODENOSCOPY  12/2014   KNEE ARTHROSCOPY W/ MENISCAL REPAIR  2009   left knee   L1-S1 Fusion     L4-5 lumbar fusion  09/02/2010   Dr. Selestino Dakin:  minimally invasive transforaminal interbody fusion with spacer   OVARIAN CYST SURGERY  1972   PATELLAR REEFING  '09   repair of fractured patella after fall   SPINE SURGERY     SYNOVIAL CYST EXCISION  2007   lumbar spine   VENOUS ABLATION     for pain in the groin - [facility]TS did procedure. Has some residual discomfort at the  ablation site.    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    Employer: [employer]  Tobacco Use   Smoking status: Never    Passive exposure: Past   Smokeless tobacco: Never  Vaping Use   Vaping status: Never Used  Substance and Sexual Activity   Alcohol use: No   Drug use: No   Sexual activity: Yes    Partners: Male    Birth control/protection: None

## 2023-09-03 ENCOUNTER — Encounter: Payer: Self-pay | Admitting: Internal Medicine

## 2023-09-03 DIAGNOSIS — I129 Hypertensive chronic kidney disease with stage 1 through stage 4 chronic kidney disease, or unspecified chronic kidney disease: Secondary | ICD-10-CM | POA: Diagnosis not present

## 2023-09-03 DIAGNOSIS — I700 Atherosclerosis of aorta: Secondary | ICD-10-CM | POA: Diagnosis not present

## 2023-09-03 DIAGNOSIS — K219 Gastro-esophageal reflux disease without esophagitis: Secondary | ICD-10-CM | POA: Diagnosis not present

## 2023-09-03 DIAGNOSIS — M48062 Spinal stenosis, lumbar region with neurogenic claudication: Secondary | ICD-10-CM | POA: Diagnosis not present

## 2023-09-03 DIAGNOSIS — I890 Lymphedema, not elsewhere classified: Secondary | ICD-10-CM | POA: Diagnosis not present

## 2023-09-03 DIAGNOSIS — E039 Hypothyroidism, unspecified: Secondary | ICD-10-CM | POA: Diagnosis not present

## 2023-09-03 DIAGNOSIS — M797 Fibromyalgia: Secondary | ICD-10-CM | POA: Diagnosis not present

## 2023-09-03 DIAGNOSIS — F419 Anxiety disorder, unspecified: Secondary | ICD-10-CM | POA: Diagnosis not present

## 2023-09-03 DIAGNOSIS — M069 Rheumatoid arthritis, unspecified: Secondary | ICD-10-CM | POA: Diagnosis not present

## 2023-09-05 DIAGNOSIS — S60211A Contusion of right wrist, initial encounter: Secondary | ICD-10-CM | POA: Diagnosis not present

## 2023-09-05 DIAGNOSIS — S63501A Unspecified sprain of right wrist, initial encounter: Secondary | ICD-10-CM | POA: Diagnosis not present

## 2023-09-05 DIAGNOSIS — G5603 Carpal tunnel syndrome, bilateral upper limbs: Secondary | ICD-10-CM | POA: Diagnosis not present

## 2023-09-05 DIAGNOSIS — Z4789 Encounter for other orthopedic aftercare: Secondary | ICD-10-CM | POA: Diagnosis not present

## 2023-09-10 DIAGNOSIS — I700 Atherosclerosis of aorta: Secondary | ICD-10-CM | POA: Diagnosis not present

## 2023-09-10 DIAGNOSIS — N182 Chronic kidney disease, stage 2 (mild): Secondary | ICD-10-CM | POA: Diagnosis not present

## 2023-09-10 DIAGNOSIS — I129 Hypertensive chronic kidney disease with stage 1 through stage 4 chronic kidney disease, or unspecified chronic kidney disease: Secondary | ICD-10-CM | POA: Diagnosis not present

## 2023-09-10 DIAGNOSIS — E039 Hypothyroidism, unspecified: Secondary | ICD-10-CM | POA: Diagnosis not present

## 2023-09-10 DIAGNOSIS — M159 Polyosteoarthritis, unspecified: Secondary | ICD-10-CM | POA: Diagnosis not present

## 2023-10-03 DIAGNOSIS — R5383 Other fatigue: Secondary | ICD-10-CM | POA: Diagnosis not present

## 2023-10-03 DIAGNOSIS — M791 Myalgia, unspecified site: Secondary | ICD-10-CM | POA: Diagnosis not present

## 2023-10-03 DIAGNOSIS — H02403 Unspecified ptosis of bilateral eyelids: Secondary | ICD-10-CM | POA: Diagnosis not present

## 2023-10-03 DIAGNOSIS — R300 Dysuria: Secondary | ICD-10-CM | POA: Diagnosis not present

## 2023-10-03 DIAGNOSIS — H538 Other visual disturbances: Secondary | ICD-10-CM | POA: Diagnosis not present

## 2023-10-03 DIAGNOSIS — M069 Rheumatoid arthritis, unspecified: Secondary | ICD-10-CM | POA: Diagnosis not present

## 2023-10-07 ENCOUNTER — Encounter: Payer: Self-pay | Admitting: Internal Medicine

## 2023-10-09 DIAGNOSIS — Z1330 Encounter for screening examination for mental health and behavioral disorders, unspecified: Secondary | ICD-10-CM | POA: Diagnosis not present

## 2023-10-09 DIAGNOSIS — M48062 Spinal stenosis, lumbar region with neurogenic claudication: Secondary | ICD-10-CM | POA: Diagnosis not present

## 2023-10-09 DIAGNOSIS — M5416 Radiculopathy, lumbar region: Secondary | ICD-10-CM | POA: Diagnosis not present

## 2023-10-11 ENCOUNTER — Other Ambulatory Visit: Payer: Self-pay | Admitting: Cardiology

## 2023-10-11 DIAGNOSIS — E039 Hypothyroidism, unspecified: Secondary | ICD-10-CM | POA: Diagnosis not present

## 2023-10-11 DIAGNOSIS — I129 Hypertensive chronic kidney disease with stage 1 through stage 4 chronic kidney disease, or unspecified chronic kidney disease: Secondary | ICD-10-CM

## 2023-10-11 DIAGNOSIS — M159 Polyosteoarthritis, unspecified: Secondary | ICD-10-CM | POA: Diagnosis not present

## 2023-10-12 ENCOUNTER — Telehealth: Payer: Self-pay | Admitting: Cardiology

## 2023-10-12 DIAGNOSIS — I129 Hypertensive chronic kidney disease with stage 1 through stage 4 chronic kidney disease, or unspecified chronic kidney disease: Secondary | ICD-10-CM

## 2023-10-12 MED ORDER — METOPROLOL SUCCINATE ER 25 MG PO TB24: 25.0000 mg | ORAL_TABLET | Freq: Two times a day (BID) | ORAL | 0 refills | Status: DC

## 2023-10-12 NOTE — Telephone Encounter (Signed)
*  STAT* If patient is at the pharmacy, call can be transferred to refill team.     1. Which medications need to be refilled? (please list name of each medication and dose if known) metoprolol  succinate (TOPROL -XL) 25 MG 24 hr tablet     2. Would you like to learn more about the convenience, safety, & potential cost savings by using the [facility]? No       3. Are you open to using the [facility] (Type [facility]. ). No     4. Which pharmacy/location (including street and city if local pharmacy) is medication to be sent to? [facility] - [place], [place] - [address]     5. Do they need a 30 day or 90 day supply? 90 day

## 2023-10-17 ENCOUNTER — Encounter: Payer: Self-pay | Admitting: Internal Medicine

## 2023-10-22 DIAGNOSIS — E039 Hypothyroidism, unspecified: Secondary | ICD-10-CM | POA: Diagnosis not present

## 2023-10-22 NOTE — Progress Notes (Signed)
 Cardiology Office Note   Date:  10/25/2023  ID:  Taylor, [name] 1950/08/04, MRN [id number] PCP: Taylor Velna SAUNDERS, MD  [name] Cardiologist:  Redell Leiter, MD Cardiology APP:  Carlin Delon BROCKS, NP     History of Present Illness [name] is a 73 y.o. female with a past medical history of hypertension, SVT, chronic venous insufficiency, lymphedema, chronic back pain, CKD, dyslipidemia, seronegative rheumatoid arthritis follow with rheumatology.  11/29/2022 calcium score of 3, 39th percentile 10/24/2022 echo E 60-65%, grade I DD, LA mildly dilated, trivial MR 01/17/2018 monitor SVE burden 4.5% >> increased BB 10/27/2014 carotid duplex bilateral mild carotid artery stenosis  She established with [facility] in 2016 for the evaluation of palpitations.  She wore a monitor in 2019 revealing SVE burden 4.5% of beta-blocker was increased.  Most recently evaluated by Dr. Leiter on 10/10/2022, she had been diagnosed with bilateral carpal tunnel syndrome, had elevated CRP and was advised to follow-up for possibility of ATTR amyloidosis.   She presents today for follow-up.  She states she is doing fair, continues to be plagued by multiple symptoms that have been hard to diagnose and explain.  She has also been dealing with a high level of personal stress caring for her husband with a new diagnosis of heart failure.  She is still waiting for a formal diagnosis from rheumatology.  She has had several back surgeries and a recent fall with suspected injury to her back and/or previously placed hardware and is waiting for follow-up on that.  She does have some palpitations that have been bothersome for her, feels that they have increased with frequency. She denies chest pain, palpitations, dyspnea, pnd, orthopnea, n, v, dizziness, syncope, edema, weight gain, or early satiety.    ROS: Review of Systems  Constitutional:  Positive for malaise/fatigue.  Cardiovascular:   Positive for palpitations.  Musculoskeletal:  Positive for back pain and myalgias.  All other systems reviewed and are negative.    Studies Reviewed EKG Interpretation Date/Time:  Wednesday October 24 2023 14:08:36 EDT Ventricular Rate:  73 PR Interval:  152 QRS Duration:  76 QT Interval:  368 QTC Calculation: 405 R Axis:   43  Text Interpretation: Normal sinus rhythm Normal ECG When compared with ECG of 10-Oct-2022 14:39, No significant change was found Confirmed by Carlin Delon [phone]) on 10/24/2023 2:11:54 PM    Cardiac Studies & Procedures   ______________________________________________________________________________________________     ECHOCARDIOGRAM  ECHOCARDIOGRAM COMPLETE 10/24/2022  Narrative ECHOCARDIOGRAM REPORT    Patient Name:   [name] HAVENS Date of Exam: 10/24/2022 Medical Rec #:  [id number]             Height:       65.0 in Accession #:    [id number]            Weight:       234.0 lb Date of Birth:  [birth year]             BSA:          2.115 m Patient Age:    72 years              BP:           133/77 mmHg Patient Gender: F                     HR:           72 bpm. Exam Location:  [facility]  Procedure: 2D Echo, Cardiac Doppler, Color Doppler, 3D Echo and Strain Analysis  Indications:    Amyloidosis E85.9  History:        Patient has prior history of Echocardiogram examinations, most recent 10/25/2018. CKD; Risk Factors:Hypertension.  Sonographer:    Augustin Seals RDCS Referring Phys: [id number] BRIAN J MUNLEY  IMPRESSIONS   1. Left ventricular ejection fraction, by estimation, is 60 to 65%. The left ventricle has normal function. The left ventricle has no regional wall motion abnormalities. Left ventricular diastolic parameters are consistent with Grade I diastolic dysfunction (impaired relaxation). The average left ventricular global longitudinal strain is -20.5 %. The global longitudinal strain is normal. 2. Right ventricular systolic  function is normal. The right ventricular size is normal. 3. Left atrial size was mildly dilated. 4. The mitral valve is normal in structure. Trivial mitral valve regurgitation. No evidence of mitral stenosis. 5. The aortic valve is normal in structure. Aortic valve regurgitation is not visualized. No aortic stenosis is present. 6. The inferior vena cava is normal in size with greater than 50% respiratory variability, suggesting right atrial pressure of 3 mmHg.  FINDINGS Left Ventricle: Left ventricular ejection fraction, by estimation, is 60 to 65%. The left ventricle has normal function. The left ventricle has no regional wall motion abnormalities. The average left ventricular global longitudinal strain is -20.5 %. The global longitudinal strain is normal. 3D ejection fraction reviewed and evaluated as part of the interpretation. Alternate measurement of EF is felt to be most reflective of LV function. The left ventricular internal cavity size was normal in size. There is no left ventricular hypertrophy. Left ventricular diastolic parameters are consistent with Grade I diastolic dysfunction (impaired relaxation).  Right Ventricle: The right ventricular size is normal. No increase in right ventricular wall thickness. Right ventricular systolic function is normal.  Left Atrium: Left atrial size was mildly dilated.  Right Atrium: Right atrial size was normal in size.  Pericardium: There is no evidence of pericardial effusion.  Mitral Valve: The mitral valve is normal in structure. Trivial mitral valve regurgitation. No evidence of mitral valve stenosis.  Tricuspid Valve: The tricuspid valve is normal in structure. Tricuspid valve regurgitation is not demonstrated. No evidence of tricuspid stenosis.  Aortic Valve: The aortic valve is normal in structure. Aortic valve regurgitation is not visualized. No aortic stenosis is present.  Pulmonic Valve: The pulmonic valve was normal in structure.  Pulmonic valve regurgitation is not visualized. No evidence of pulmonic stenosis.  Aorta: The aortic root is normal in size and structure.  Venous: The inferior vena cava is normal in size with greater than 50% respiratory variability, suggesting right atrial pressure of 3 mmHg.  IAS/Shunts: No atrial level shunt detected by color flow Doppler.   LEFT VENTRICLE PLAX 2D LVIDd:         4.20 cm   Diastology LVIDs:         2.70 cm   LV e' medial:    6.05 cm/s LV PW:         1.00 cm   LV E/e' medial:  13.7 LV IVS:        1.00 cm   LV e' lateral:   6.22 cm/s LVOT diam:     2.00 cm   LV E/e' lateral: 13.4 LV SV:         70 LV SV Index:   33        2D Longitudinal Strain LVOT Area:     3.14 cm  2D Strain GLS Avg:     -20.5 %  3D Volume EF: 3D EF:        53 % LV EDV:       99 ml LV ESV:       46 ml LV SV:        53 ml  RIGHT VENTRICLE RV Basal diam:  3.80 cm RV Mid diam:    3.90 cm RV S prime:     14.50 cm/s TAPSE (M-mode): 2.7 cm  LEFT ATRIUM             Index        RIGHT ATRIUM           Index LA diam:        4.20 cm 1.99 cm/m   RA Area:     13.10 cm LA Vol (A2C):   52.9 ml 25.02 ml/m  RA Volume:   28.70 ml  13.57 ml/m LA Vol (A4C):   45.9 ml 21.71 ml/m LA Biplane Vol: 49.3 ml 23.31 ml/m AORTIC VALVE LVOT Vmax:   106.00 cm/s LVOT Vmean:  68.900 cm/s LVOT VTI:    0.223 m  AORTA Ao Root diam: 2.60 cm Ao Asc diam:  3.20 cm  MITRAL VALVE               TRICUSPID VALVE MV Area (PHT): 3.34 cm    TR Peak grad:   29.4 mmHg MV Decel Time: 227 msec    TR Vmax:        271.00 cm/s MV E velocity: 83.10 cm/s MV A velocity: 97.90 cm/s  SHUNTS MV E/A ratio:  0.85        Systemic VTI:  0.22 m Systemic Diam: 2.00 cm  Oneil Parchment MD Electronically signed by Oneil Parchment MD Signature Date/Time: 10/24/2022/4:36:52 PM    Final    MONITORS  LONG TERM MONITOR (3-14 DAYS) 12/24/2017  Narrative A ZIO monitor was performed for 14 days to assess arrhythmia in a patient with  previous SVT.  Baseline rhythm is sinus minimum average maximum heart rates are 5985 and 143 bpm.  Rare ventricular ectopy is seen with isolated PVCs and couplets less than 1%.  Supraventricular ectopy is seen with a burden of approximately 4.5%.  There are frequent APCs couplets of APCs and 338 brief runs of atrial premature contractions.  Longest episode 13-1/2 seconds rate of 185 the fastest 9 seconds rate of 207 and these are atrial tachycardia.  There are no episodes of atrial fibrillation or flutter.  Bradycardic events  There were triggered and 8 symptomatic events and the symptomatic events are associated with runs of atrial premature contractions.   Conclusion, significant supraventricular ectopy recurrent runs of APCs brief atrial tachycardia and symptomatic events correlating with atrial arrhythmia   CT SCANS  CT CARDIAC SCORING (SELF PAY ONLY) 11/15/2022  Addendum 11/29/2022  2:29 PM ADDENDUM REPORT: 11/29/2022 14:26  EXAM: OVER-READ INTERPRETATION CT CHEST  The following report is an over-read performed by radiologist Dr. Norman Hopper of [facility] on 11/29/2022. This over-read does not include interpretation of cardiac or coronary anatomy or pathology. The coronary calcium score interpretation by the cardiologist is attached.  COMPARISON:  Chest CT dated 06/16/2022.  FINDINGS: Cardiovascular: Vascular calcifications are seen in the thoracic aorta. Normal heart size. No pericardial effusion.  Mediastinum/Nodes: No enlarged mediastinal lymph nodes. The visible trachea and esophagus demonstrate no significant findings.  Lungs/Pleura: Mild bibasilar atelectasis/scarring. No pleural effusion.  Upper Abdomen: The liver  is hypoattenuating, suggestive of hepatic steatosis.  Musculoskeletal: No chest wall mass or suspicious bone lesions identified.  IMPRESSION: Findings suggestive of hepatic steatosis.  Aortic Atherosclerosis  (ICD10-I70.0).   Electronically Signed By: Norman Hopper M.D. On: 11/29/2022 14:26  Narrative CLINICAL DATA:  Cardiovascular Disease Risk stratification  EXAM: Coronary Calcium Score  TECHNIQUE: A gated, non-contrast computed tomography scan of the heart was performed using 3mm slice thickness. Axial images were analyzed on a dedicated workstation. Calcium scoring of the coronary arteries was performed using the Agatston method.  FINDINGS: Coronary arteries: Normal origins.  Coronary Calcium Score:  Left main:  Left anterior descending artery: 3  Left circumflex artery:  Right coronary artery:  Total: 3  Percentile: 39th  Pericardium: Normal.  Ascending Aorta: Normal caliber.  Non-cardiac: See separate report from [facility].  IMPRESSION: Coronary calcium score of 3. This was 39th percentile for age-, race-, and sex-matched controls.  RECOMMENDATIONS: Coronary artery calcium (CAC) score is a strong predictor of incident coronary heart disease (CHD) and provides predictive information beyond traditional risk factors. CAC scoring is reasonable to use in the decision to withhold, postpone, or initiate statin therapy in intermediate-risk or selected borderline-risk asymptomatic adults (age 80-75 years and LDL-C >=70 to <190 mg/dL) who do not have diabetes or established atherosclerotic cardiovascular disease (ASCVD).* In intermediate-risk (10-year ASCVD risk >=7.5% to <20%) adults or selected borderline-risk (10-year ASCVD risk >=5% to <7.5%) adults in whom a CAC score is measured for the purpose of making a treatment decision the following recommendations have been made:  If CAC=0, it is reasonable to withhold statin therapy and reassess in 5 to 10 years, as long as higher risk conditions are absent (diabetes mellitus, family history of premature CHD in first degree relatives (males <55 years; females <65 years), cigarette smoking, or LDL >=190  mg/dL).  If CAC is 1 to 99, it is reasonable to initiate statin therapy for patients >=7 years of age.  If CAC is >=100 or >=75th percentile, it is reasonable to initiate statin therapy at any age.  Cardiology referral should be considered for patients with CAC scores >=400 or >=75th percentile.  *2018 AHA/ACC/AACVPR/AAPA/ABC/ACPM/ADA/AGS/APhA/ASPC/NLA/PCNA Guideline on the Management of Blood Cholesterol: A Report of the American College of Cardiology/American Heart Association Task Force on Clinical Practice Guidelines. J Am Coll Cardiol. 2019;73(24):3168-3209.  Lonni Nanas, MD  Electronically Signed: By: Lonni Nanas M.D. On: 11/17/2022 13:36     ______________________________________________________________________________________________      Risk Assessment/Calculations    STOP-Bang Score:  6      Physical Exam VS:  BP (!) 142/90   Pulse 73   Ht 5' 5 (1.651 m)   Wt 244 lb (110.7 kg)   SpO2 95%   BMI 40.60 kg/m        Wt Readings from Last 3 Encounters:  10/24/23 244 lb (110.7 kg)  08/21/23 247 lb (112 kg)  05/21/23 242 lb (109.8 kg)    GEN: Well nourished, well developed in no acute distress NECK: No JVD; No carotid bruits CARDIAC: RRR, no murmurs, rubs, gallops RESPIRATORY:  Clear to auscultation without rales, wheezing or rhonchi  ABDOMEN: Soft, non-tender, non-distended EXTREMITIES:  No edema; No deformity   ASSESSMENT AND PLAN Agatston calcium score less than 100-calcium score of 3, 39th percentile. Heart healthy diet and regular cardiovascular exercise encouraged.  Stable with no anginal symptoms. No indication for ischemic evaluation.    Hypertension-blood pressure is elevated today at 142/90 however she is in a lot of  pain and under a lot of personal stress, for now she will try to keep an eye on it continue spironolactone  25 mg daily, continue metoprolol  25 mg twice daily, if her blood pressure continues to be elevated, we  could try her on an ARB.  Palpitations-she feels like these have worsened in frequency and duration, previous monitor revealed SVE burden 4.5%, continue metoprolol  25 mg twice daily.  Will repeat a monitor to determine if her palpitations are still SVE, at times that can last 15+ minutes so want to make sure she is not having episodes of atrial fibrillation.  Lymphedema-stable, she was Lymphapress devices each day.  Dyslipidemia-most recent LDL was elevated at 104, we do not have time to address this today but ideally her cholesterol needs to be much better however she does have many medication intolerances, appears to be formally checked by her PCP.  Snoring - StopBang score of 6, will arrange for an at home Itamar.        Dispo: Itamar sleep study, monitor x 7 days, follow up in 6 months.   Signed, Delon JAYSON Hoover, NP

## 2023-10-24 ENCOUNTER — Telehealth: Payer: Self-pay

## 2023-10-24 ENCOUNTER — Encounter: Payer: Self-pay | Admitting: Cardiology

## 2023-10-24 ENCOUNTER — Ambulatory Visit: Attending: Cardiology | Admitting: Cardiology

## 2023-10-24 ENCOUNTER — Ambulatory Visit: Attending: Cardiology

## 2023-10-24 VITALS — BP 142/90 | HR 73 | Ht 65.0 in | Wt 244.0 lb

## 2023-10-24 DIAGNOSIS — R0683 Snoring: Secondary | ICD-10-CM | POA: Diagnosis not present

## 2023-10-24 DIAGNOSIS — R002 Palpitations: Secondary | ICD-10-CM | POA: Diagnosis present

## 2023-10-24 DIAGNOSIS — E782 Mixed hyperlipidemia: Secondary | ICD-10-CM | POA: Diagnosis not present

## 2023-10-24 DIAGNOSIS — I4719 Other supraventricular tachycardia: Secondary | ICD-10-CM

## 2023-10-24 DIAGNOSIS — R931 Abnormal findings on diagnostic imaging of heart and coronary circulation: Secondary | ICD-10-CM | POA: Diagnosis not present

## 2023-10-24 DIAGNOSIS — G4719 Other hypersomnia: Secondary | ICD-10-CM

## 2023-10-24 DIAGNOSIS — I10 Essential (primary) hypertension: Secondary | ICD-10-CM | POA: Diagnosis not present

## 2023-10-24 DIAGNOSIS — M0600 Rheumatoid arthritis without rheumatoid factor, unspecified site: Secondary | ICD-10-CM | POA: Insufficient documentation

## 2023-10-24 NOTE — Telephone Encounter (Signed)
 Patient agreement reviewed and signed on 10/24/2023.  WatchPAT issued to patient on 10/24/2023 by Charlie LILLETTE Free, RN. Patient aware to not open the WatchPAT box until contacted with the activation PIN. Patient profile initialized in CloudPAT on 10/24/2023 by Charlie LILLETTE Free RN. Device serial number: [id number]

## 2023-10-24 NOTE — Patient Instructions (Signed)
 Medication Instructions:  Your physician recommends that you continue on your current medications as directed. Please refer to the Current Medication list given to you today.  *If you need a refill on your cardiac medications before your next appointment, please call your pharmacy*  Lab Work: None If you have labs (blood work) drawn today and your tests are completely normal, you will receive your results only by: MyChart Message (if you have MyChart) OR A paper copy in the mail If you have any lab test that is abnormal or we need to change your treatment, we will call you to review the results.  Testing/Procedures: Itamar Sleep Study  A zio monitor was ordered today. It will remain on for 7 days. You will then return monitor and event diary in provided box. It takes 1-2 weeks for report to be downloaded and returned to us . We will call you with the results. If monitor falls off or has orange flashing light, please call Zio for further instructions.    Follow-Up: At [facility], you and your health needs are our priority.  As part of our continuing mission to provide you with exceptional heart care, our providers are all part of one team.  This team includes your primary Cardiologist (physician) and Advanced Practice Providers or APPs (Physician Assistants and Nurse Practitioners) who all work together to provide you with the care you need, when you need it.  Your next appointment:   6 month(s)  Provider:   Redell Leiter, MD    We recommend signing up for the patient portal called MyChart.  Sign up information is provided on this After Visit Summary.  MyChart is used to connect with patients for Virtual Visits (Telemedicine).  Patients are able to view lab/test results, encounter notes, upcoming appointments, etc.  Non-urgent messages can be sent to your provider as well.   To learn more about what you can do with MyChart, go to [email or web address].   Other  Instructions Dr. Camellia Denis is the allergy /immunologist in [place]. Call his office if you want to see him.

## 2023-10-26 ENCOUNTER — Encounter: Payer: Self-pay | Admitting: Internal Medicine

## 2023-10-27 ENCOUNTER — Encounter: Payer: Self-pay | Admitting: Cardiology

## 2023-10-29 NOTE — Telephone Encounter (Signed)
**Note De-Identified Alcee Sipos Obfuscation** All Itamar-HST that were ordered after 10/05/23 will be provided to the pt by the Itamar company and they will do the PAs as well.

## 2023-10-29 NOTE — Telephone Encounter (Signed)
 Pt is calling checking the status of this message

## 2023-11-01 NOTE — Telephone Encounter (Signed)
Xrays mailed to patient.

## 2023-11-07 ENCOUNTER — Encounter: Payer: Self-pay | Admitting: Cardiology

## 2023-11-07 DIAGNOSIS — M5451 Vertebrogenic low back pain: Secondary | ICD-10-CM | POA: Diagnosis not present

## 2023-11-07 DIAGNOSIS — M4326 Fusion of spine, lumbar region: Secondary | ICD-10-CM | POA: Diagnosis not present

## 2023-11-07 DIAGNOSIS — I4719 Other supraventricular tachycardia: Secondary | ICD-10-CM | POA: Diagnosis not present

## 2023-11-07 DIAGNOSIS — M48062 Spinal stenosis, lumbar region with neurogenic claudication: Secondary | ICD-10-CM | POA: Diagnosis not present

## 2023-11-08 ENCOUNTER — Ambulatory Visit: Payer: Self-pay | Admitting: Cardiology

## 2023-11-08 DIAGNOSIS — I4719 Other supraventricular tachycardia: Secondary | ICD-10-CM | POA: Diagnosis not present

## 2023-11-13 ENCOUNTER — Other Ambulatory Visit: Payer: Self-pay | Admitting: Physician Assistant

## 2023-11-13 DIAGNOSIS — M4326 Fusion of spine, lumbar region: Secondary | ICD-10-CM

## 2023-11-13 DIAGNOSIS — M48062 Spinal stenosis, lumbar region with neurogenic claudication: Secondary | ICD-10-CM

## 2023-11-14 ENCOUNTER — Other Ambulatory Visit: Payer: Self-pay

## 2023-11-14 MED ORDER — METOPROLOL SUCCINATE ER 50 MG PO TB24: ORAL_TABLET | ORAL | 3 refills | Status: AC

## 2023-11-14 NOTE — Progress Notes (Signed)
 Called the patient to clarify if she wanted Dr. Monetta to review her heart monitor results, and she stated that was not necessary. Reviewed the results with the patient and she chose to increase her Metoprolol  to 50 mg in the am and 25 mg in the pm. Patient verbalized understanding regarding her new dose of Metoprolol  and had no further questions at this time.

## 2023-11-15 DIAGNOSIS — Z85820 Personal history of malignant melanoma of skin: Secondary | ICD-10-CM | POA: Diagnosis not present

## 2023-11-15 DIAGNOSIS — D1801 Hemangioma of skin and subcutaneous tissue: Secondary | ICD-10-CM | POA: Diagnosis not present

## 2023-11-15 DIAGNOSIS — L821 Other seborrheic keratosis: Secondary | ICD-10-CM | POA: Diagnosis not present

## 2023-11-19 ENCOUNTER — Telehealth: Payer: Self-pay

## 2023-11-19 ENCOUNTER — Other Ambulatory Visit: Payer: Self-pay

## 2023-11-19 NOTE — Telephone Encounter (Signed)
 Patient recently wore a heart monitor and was found to be having palpitations. She was started on Metoprolol  succinate 50 mg in the am and 25 mg in the pm. After speaking to her today she reported that after taking her morning dose of Metoprolol  she became a little light headed and dizzy. She has not been recording her blood pressures on a regular basis. I asked her to start checking her blood pressure on a regular basis since she is starting this new medication. Patient verbalized understanding and had no further questions at this time.

## 2023-11-22 NOTE — Telephone Encounter (Signed)
 Called the patient and informed her about the length of time it may take for Itamar to get the approval with her insurance for her to take the sleep study test:  Since this pts Itamar-HST was ordered after 10/16/23, the Itamar company will be doing the PA and will provide the pt with the device via the mail.  We have been telling pts that they will receive their device in 6 to 8 weeks. This is new and still a work in progress.  Patient verbalized understanding and had no further questions at this time.

## 2023-11-23 ENCOUNTER — Ambulatory Visit: Admitting: Internal Medicine

## 2023-12-03 ENCOUNTER — Other Ambulatory Visit: Payer: Self-pay | Admitting: Physician Assistant

## 2023-12-03 DIAGNOSIS — M48062 Spinal stenosis, lumbar region with neurogenic claudication: Secondary | ICD-10-CM

## 2023-12-03 DIAGNOSIS — M4326 Fusion of spine, lumbar region: Secondary | ICD-10-CM

## 2023-12-03 NOTE — Discharge Instructions (Signed)

## 2023-12-04 ENCOUNTER — Ambulatory Visit
Admission: RE | Admit: 2023-12-04 | Discharge: 2023-12-04 | Disposition: A | Discharge status: Home / Self Care | Source: Ambulatory Visit | Attending: Physician Assistant | Admitting: Physician Assistant

## 2023-12-04 DIAGNOSIS — M4804 Spinal stenosis, thoracic region: Secondary | ICD-10-CM | POA: Diagnosis not present

## 2023-12-04 DIAGNOSIS — M4326 Fusion of spine, lumbar region: Secondary | ICD-10-CM

## 2023-12-04 DIAGNOSIS — M47814 Spondylosis without myelopathy or radiculopathy, thoracic region: Secondary | ICD-10-CM | POA: Diagnosis not present

## 2023-12-04 DIAGNOSIS — M48062 Spinal stenosis, lumbar region with neurogenic claudication: Secondary | ICD-10-CM

## 2023-12-04 DIAGNOSIS — M4316 Spondylolisthesis, lumbar region: Secondary | ICD-10-CM | POA: Diagnosis not present

## 2023-12-04 DIAGNOSIS — M5126 Other intervertebral disc displacement, lumbar region: Secondary | ICD-10-CM | POA: Diagnosis not present

## 2023-12-04 DIAGNOSIS — M48061 Spinal stenosis, lumbar region without neurogenic claudication: Secondary | ICD-10-CM | POA: Diagnosis not present

## 2023-12-04 MED ORDER — IOPAMIDOL (ISOVUE-M 300) INJECTION 61%
10.0000 mL | Freq: Once | INTRAMUSCULAR | Status: AC
Start: 2023-12-04 — End: 2023-12-04
  Administered 2023-12-04: 10 mL via INTRATHECAL

## 2023-12-04 MED ORDER — MEPERIDINE HCL 50 MG/ML IJ SOLN: 50.0000 mg | Freq: Once | INTRAMUSCULAR | Status: DC | PRN

## 2023-12-04 MED ORDER — DIAZEPAM 5 MG PO TABS: 5.0000 mg | ORAL_TABLET | Freq: Once | ORAL | Status: DC

## 2023-12-04 MED ORDER — ONDANSETRON HCL 4 MG/2ML IJ SOLN: 4.0000 mg | Freq: Once | INTRAMUSCULAR | Status: DC | PRN

## 2023-12-05 DIAGNOSIS — R509 Fever, unspecified: Secondary | ICD-10-CM | POA: Diagnosis not present

## 2023-12-05 DIAGNOSIS — M1991 Primary osteoarthritis, unspecified site: Secondary | ICD-10-CM | POA: Diagnosis not present

## 2023-12-05 DIAGNOSIS — M064 Inflammatory polyarthropathy: Secondary | ICD-10-CM | POA: Diagnosis not present

## 2023-12-05 DIAGNOSIS — Z6841 Body Mass Index (BMI) 40.0 and over, adult: Secondary | ICD-10-CM | POA: Diagnosis not present

## 2023-12-05 DIAGNOSIS — M79641 Pain in right hand: Secondary | ICD-10-CM | POA: Diagnosis not present

## 2023-12-05 DIAGNOSIS — G894 Chronic pain syndrome: Secondary | ICD-10-CM | POA: Diagnosis not present

## 2023-12-05 DIAGNOSIS — M79642 Pain in left hand: Secondary | ICD-10-CM | POA: Diagnosis not present

## 2023-12-05 DIAGNOSIS — R5382 Chronic fatigue, unspecified: Secondary | ICD-10-CM | POA: Diagnosis not present

## 2023-12-05 DIAGNOSIS — L409 Psoriasis, unspecified: Secondary | ICD-10-CM | POA: Diagnosis not present

## 2023-12-11 DIAGNOSIS — R7401 Elevation of levels of liver transaminase levels: Secondary | ICD-10-CM | POA: Diagnosis not present

## 2023-12-11 DIAGNOSIS — K582 Mixed irritable bowel syndrome: Secondary | ICD-10-CM | POA: Diagnosis not present

## 2023-12-11 DIAGNOSIS — Z8739 Personal history of other diseases of the musculoskeletal system and connective tissue: Secondary | ICD-10-CM | POA: Diagnosis not present

## 2023-12-11 DIAGNOSIS — K219 Gastro-esophageal reflux disease without esophagitis: Secondary | ICD-10-CM | POA: Diagnosis not present

## 2023-12-24 ENCOUNTER — Telehealth: Payer: Self-pay

## 2023-12-25 DIAGNOSIS — G8929 Other chronic pain: Secondary | ICD-10-CM | POA: Diagnosis not present

## 2023-12-25 DIAGNOSIS — Z981 Arthrodesis status: Secondary | ICD-10-CM | POA: Diagnosis not present

## 2023-12-25 DIAGNOSIS — M546 Pain in thoracic spine: Secondary | ICD-10-CM | POA: Diagnosis not present

## 2023-12-25 DIAGNOSIS — M791 Myalgia, unspecified site: Secondary | ICD-10-CM | POA: Diagnosis not present

## 2023-12-25 DIAGNOSIS — M545 Low back pain, unspecified: Secondary | ICD-10-CM | POA: Diagnosis not present

## 2023-12-25 DIAGNOSIS — G894 Chronic pain syndrome: Secondary | ICD-10-CM | POA: Diagnosis not present

## 2023-12-26 DIAGNOSIS — Z1231 Encounter for screening mammogram for malignant neoplasm of breast: Secondary | ICD-10-CM | POA: Diagnosis not present

## 2023-12-26 NOTE — Telephone Encounter (Signed)
 [name]

## 2023-12-27 ENCOUNTER — Other Ambulatory Visit: Payer: Self-pay

## 2023-12-27 DIAGNOSIS — I129 Hypertensive chronic kidney disease with stage 1 through stage 4 chronic kidney disease, or unspecified chronic kidney disease: Secondary | ICD-10-CM | POA: Diagnosis not present

## 2023-12-27 DIAGNOSIS — Z23 Encounter for immunization: Secondary | ICD-10-CM | POA: Diagnosis not present

## 2023-12-27 DIAGNOSIS — M48062 Spinal stenosis, lumbar region with neurogenic claudication: Secondary | ICD-10-CM | POA: Diagnosis not present

## 2023-12-27 NOTE — Telephone Encounter (Signed)
 Called the patient and she stated that she already has the Watch PAT One device.

## 2023-12-28 ENCOUNTER — Telehealth: Payer: Self-pay

## 2023-12-28 NOTE — Telephone Encounter (Signed)
 Ordering provider: Delon Hoover, NP Associated diagnoses: PAT (paroxysmal atrial tachycardia) [I47.19]; Palpitations [R00.2]; Snoring [R06.83]; Excessive daytime sleepiness [G47.19]  Patient notified of PIN (1234) on 12/28/2023 via Notification Method: phone.  Phone note routed to covering staff for follow-up.

## 2023-12-31 DIAGNOSIS — M1991 Primary osteoarthritis, unspecified site: Secondary | ICD-10-CM | POA: Diagnosis not present

## 2023-12-31 DIAGNOSIS — G894 Chronic pain syndrome: Secondary | ICD-10-CM | POA: Diagnosis not present

## 2023-12-31 DIAGNOSIS — E790 Hyperuricemia without signs of inflammatory arthritis and tophaceous disease: Secondary | ICD-10-CM | POA: Diagnosis not present

## 2023-12-31 DIAGNOSIS — L409 Psoriasis, unspecified: Secondary | ICD-10-CM | POA: Diagnosis not present

## 2023-12-31 DIAGNOSIS — Z6841 Body Mass Index (BMI) 40.0 and over, adult: Secondary | ICD-10-CM | POA: Diagnosis not present

## 2023-12-31 DIAGNOSIS — I7300 Raynaud's syndrome without gangrene: Secondary | ICD-10-CM | POA: Diagnosis not present

## 2024-01-01 NOTE — Telephone Encounter (Signed)
 Called patient and verified that her sleep study had been approved and that she had everything she needed to perform the sleep study. Patient stated that she had everything she needed and had no further questions at this time.

## 2024-01-09 DIAGNOSIS — I129 Hypertensive chronic kidney disease with stage 1 through stage 4 chronic kidney disease, or unspecified chronic kidney disease: Secondary | ICD-10-CM | POA: Diagnosis not present

## 2024-01-09 DIAGNOSIS — R197 Diarrhea, unspecified: Secondary | ICD-10-CM | POA: Diagnosis not present

## 2024-01-09 DIAGNOSIS — R109 Unspecified abdominal pain: Secondary | ICD-10-CM | POA: Diagnosis not present

## 2024-01-09 DIAGNOSIS — R110 Nausea: Secondary | ICD-10-CM | POA: Diagnosis not present

## 2024-01-14 ENCOUNTER — Encounter: Payer: Self-pay | Admitting: Radiology

## 2024-01-22 DIAGNOSIS — M961 Postlaminectomy syndrome, not elsewhere classified: Secondary | ICD-10-CM | POA: Diagnosis not present

## 2024-01-22 DIAGNOSIS — Z6841 Body Mass Index (BMI) 40.0 and over, adult: Secondary | ICD-10-CM | POA: Diagnosis not present

## 2024-02-21 DIAGNOSIS — Z1331 Encounter for screening for depression: Secondary | ICD-10-CM | POA: Diagnosis not present

## 2024-02-21 DIAGNOSIS — I890 Lymphedema, not elsewhere classified: Secondary | ICD-10-CM | POA: Diagnosis not present

## 2024-02-21 DIAGNOSIS — R051 Acute cough: Secondary | ICD-10-CM | POA: Diagnosis not present

## 2024-02-21 DIAGNOSIS — J988 Other specified respiratory disorders: Secondary | ICD-10-CM | POA: Diagnosis not present

## 2024-02-21 DIAGNOSIS — R0981 Nasal congestion: Secondary | ICD-10-CM | POA: Diagnosis not present

## 2024-02-21 DIAGNOSIS — M069 Rheumatoid arthritis, unspecified: Secondary | ICD-10-CM | POA: Diagnosis not present

## 2024-02-21 DIAGNOSIS — I129 Hypertensive chronic kidney disease with stage 1 through stage 4 chronic kidney disease, or unspecified chronic kidney disease: Secondary | ICD-10-CM | POA: Diagnosis not present

## 2024-02-21 DIAGNOSIS — F419 Anxiety disorder, unspecified: Secondary | ICD-10-CM | POA: Diagnosis not present

## 2024-02-21 DIAGNOSIS — E039 Hypothyroidism, unspecified: Secondary | ICD-10-CM | POA: Diagnosis not present

## 2024-02-21 DIAGNOSIS — Z03818 Encounter for observation for suspected exposure to other biological agents ruled out: Secondary | ICD-10-CM | POA: Diagnosis not present

## 2024-02-21 DIAGNOSIS — I491 Atrial premature depolarization: Secondary | ICD-10-CM | POA: Diagnosis not present

## 2024-02-21 DIAGNOSIS — R5382 Chronic fatigue, unspecified: Secondary | ICD-10-CM | POA: Diagnosis not present

## 2024-02-21 DIAGNOSIS — Z0000 Encounter for general adult medical examination without abnormal findings: Secondary | ICD-10-CM | POA: Diagnosis not present

## 2024-02-21 DIAGNOSIS — M797 Fibromyalgia: Secondary | ICD-10-CM | POA: Diagnosis not present

## 2024-02-21 DIAGNOSIS — Z23 Encounter for immunization: Secondary | ICD-10-CM | POA: Diagnosis not present

## 2024-02-21 DIAGNOSIS — M8588 Other specified disorders of bone density and structure, other site: Secondary | ICD-10-CM | POA: Diagnosis not present

## 2024-02-21 DIAGNOSIS — I700 Atherosclerosis of aorta: Secondary | ICD-10-CM | POA: Diagnosis not present

## 2024-03-12 ENCOUNTER — Other Ambulatory Visit: Payer: Self-pay | Admitting: Internal Medicine

## 2024-03-12 DIAGNOSIS — R748 Abnormal levels of other serum enzymes: Secondary | ICD-10-CM

## 2024-03-26 ENCOUNTER — Other Ambulatory Visit

## 2024-05-21 ENCOUNTER — Ambulatory Visit: Admitting: Obstetrics and Gynecology
